# Patient Record
Sex: Male | Born: 1945 | Race: White | Hispanic: No | Marital: Married | Smoking: Former smoker
Health system: Southern US, Community
[De-identification: ages and names within clinical notes are randomized; demographics above are authoritative.]

## PROBLEM LIST (undated history)

## (undated) DIAGNOSIS — E349 Endocrine disorder, unspecified: Secondary | ICD-10-CM

## (undated) DIAGNOSIS — E119 Type 2 diabetes mellitus without complications: Secondary | ICD-10-CM

## (undated) DIAGNOSIS — E538 Deficiency of other specified B group vitamins: Secondary | ICD-10-CM

## (undated) DIAGNOSIS — M549 Dorsalgia, unspecified: Secondary | ICD-10-CM

## (undated) DIAGNOSIS — E785 Hyperlipidemia, unspecified: Secondary | ICD-10-CM

## (undated) DIAGNOSIS — F419 Anxiety disorder, unspecified: Secondary | ICD-10-CM

## (undated) HISTORY — PX: STRABISMUS SURGERY: SHX218

## (undated) HISTORY — PX: CHOLECYSTECTOMY: SHX55

---

## 2010-10-07 ENCOUNTER — Ambulatory Visit
Admission: RE | Admit: 2010-10-07 | Discharge: 2010-10-07 | Disposition: A | Discharge status: Home / Self Care | Payer: 59 | Source: Ambulatory Visit | Attending: Family Medicine | Admitting: Family Medicine

## 2010-10-07 ENCOUNTER — Other Ambulatory Visit: Payer: Self-pay | Admitting: Family Medicine

## 2010-10-07 ENCOUNTER — Other Ambulatory Visit: Payer: Self-pay | Admitting: Unknown Physician Specialty

## 2010-10-07 IMAGING — CR DG ABDOMEN 2V
3 series · 3 of 3 positions shown · non-contrast
Comparison: None.

CLINICAL DATA: Abdominal pain, evaluate for constipation.

ABDOMEN - 2 VIEW

[view not recorded (1 of 3)]
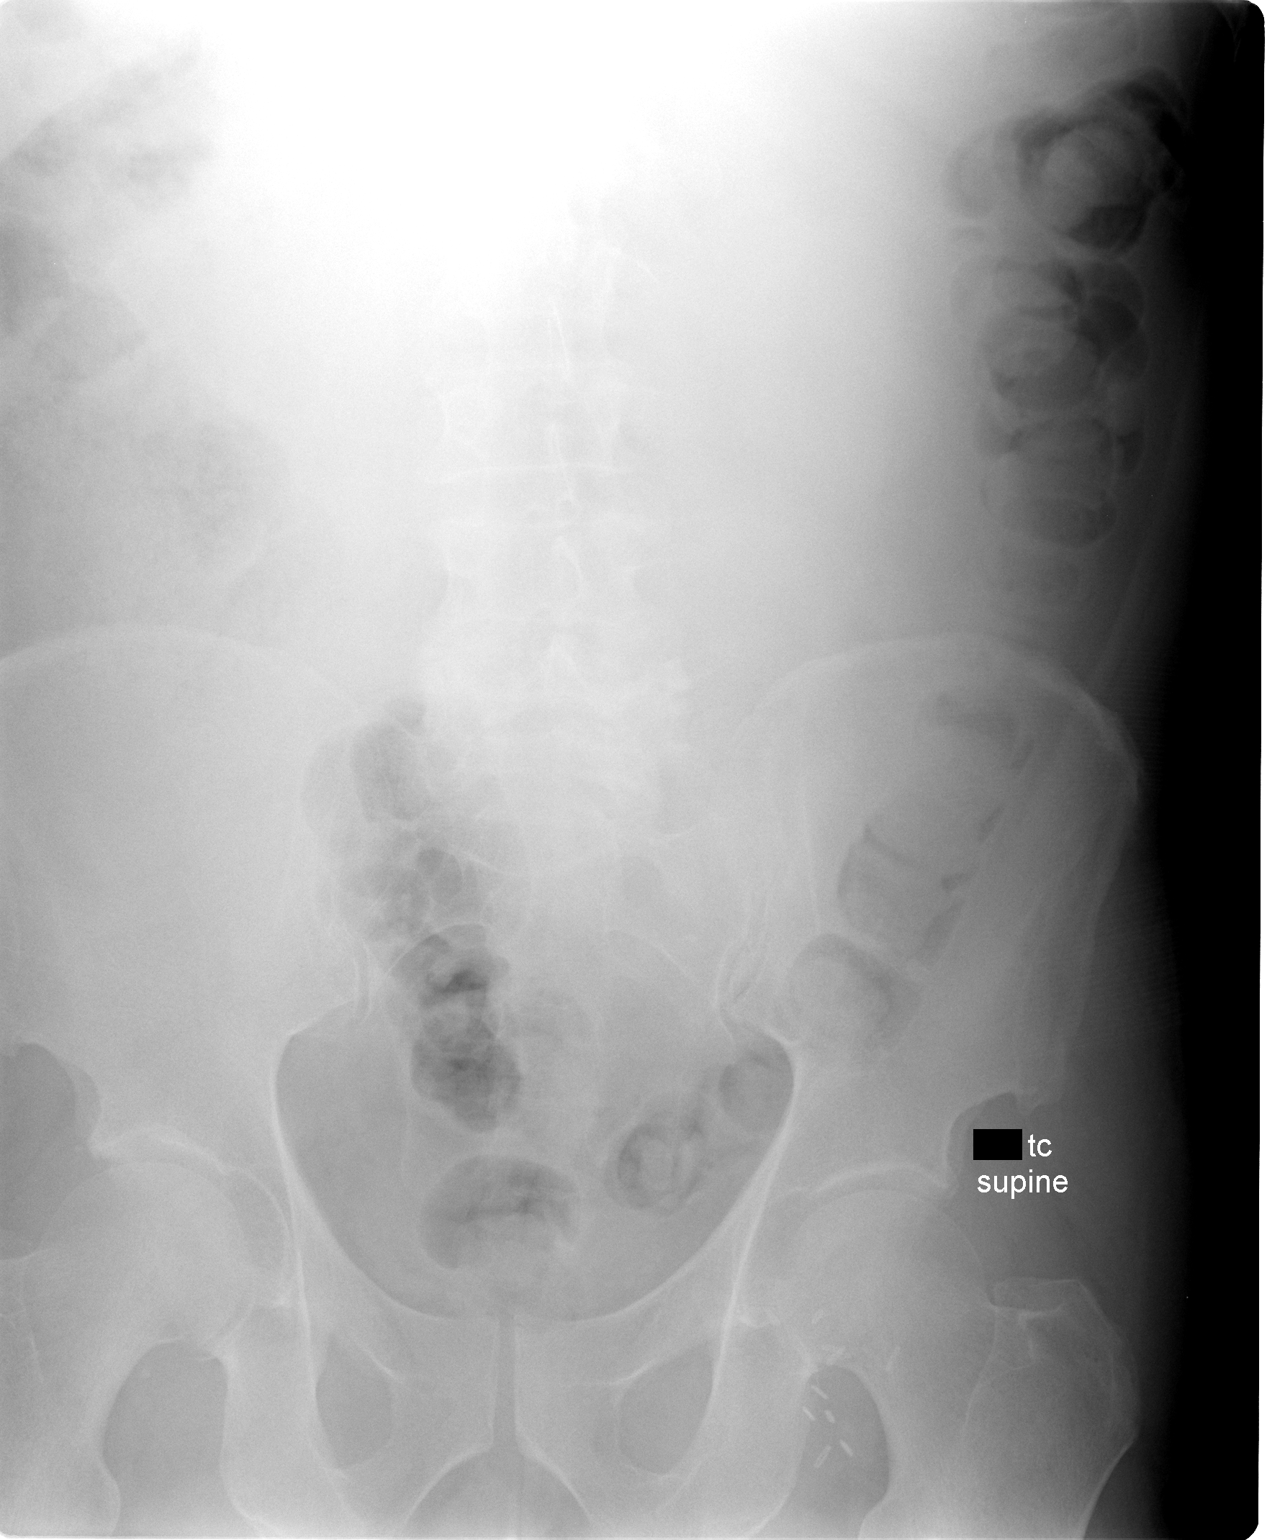

[view not recorded (2 of 3)]
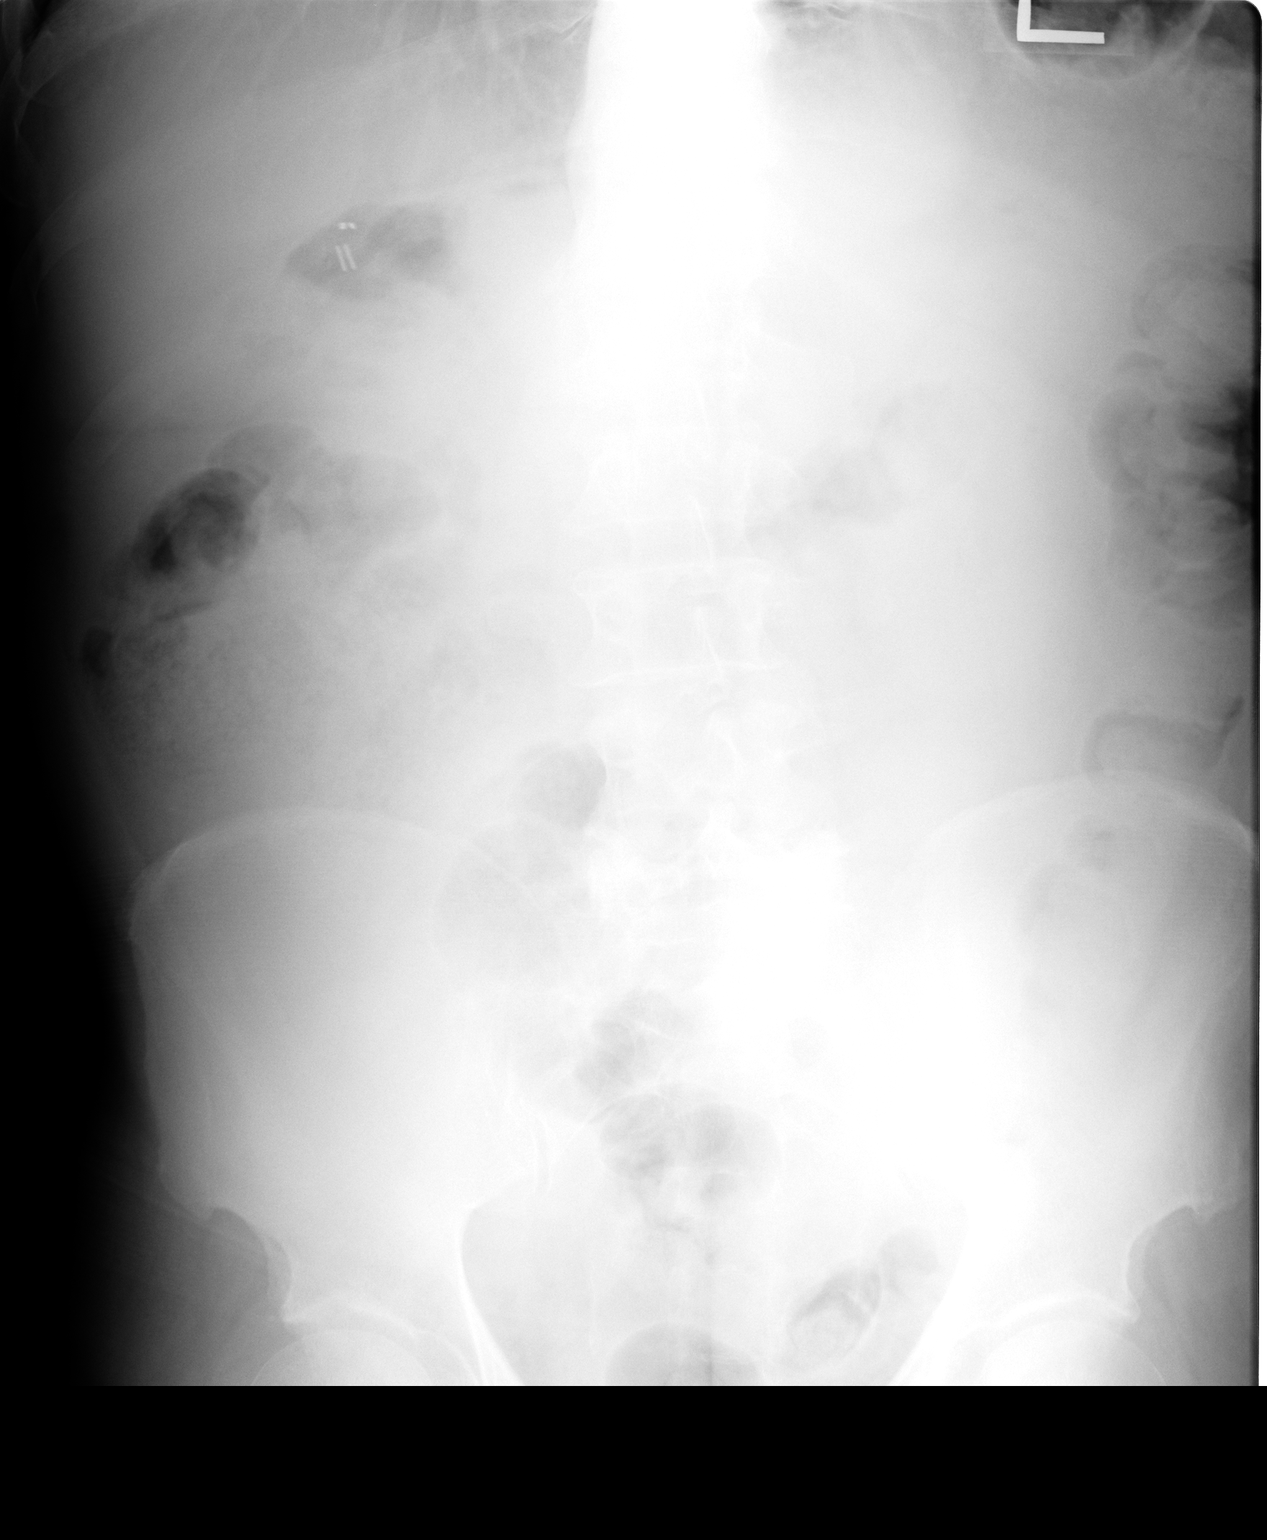

[view not recorded (3 of 3)]
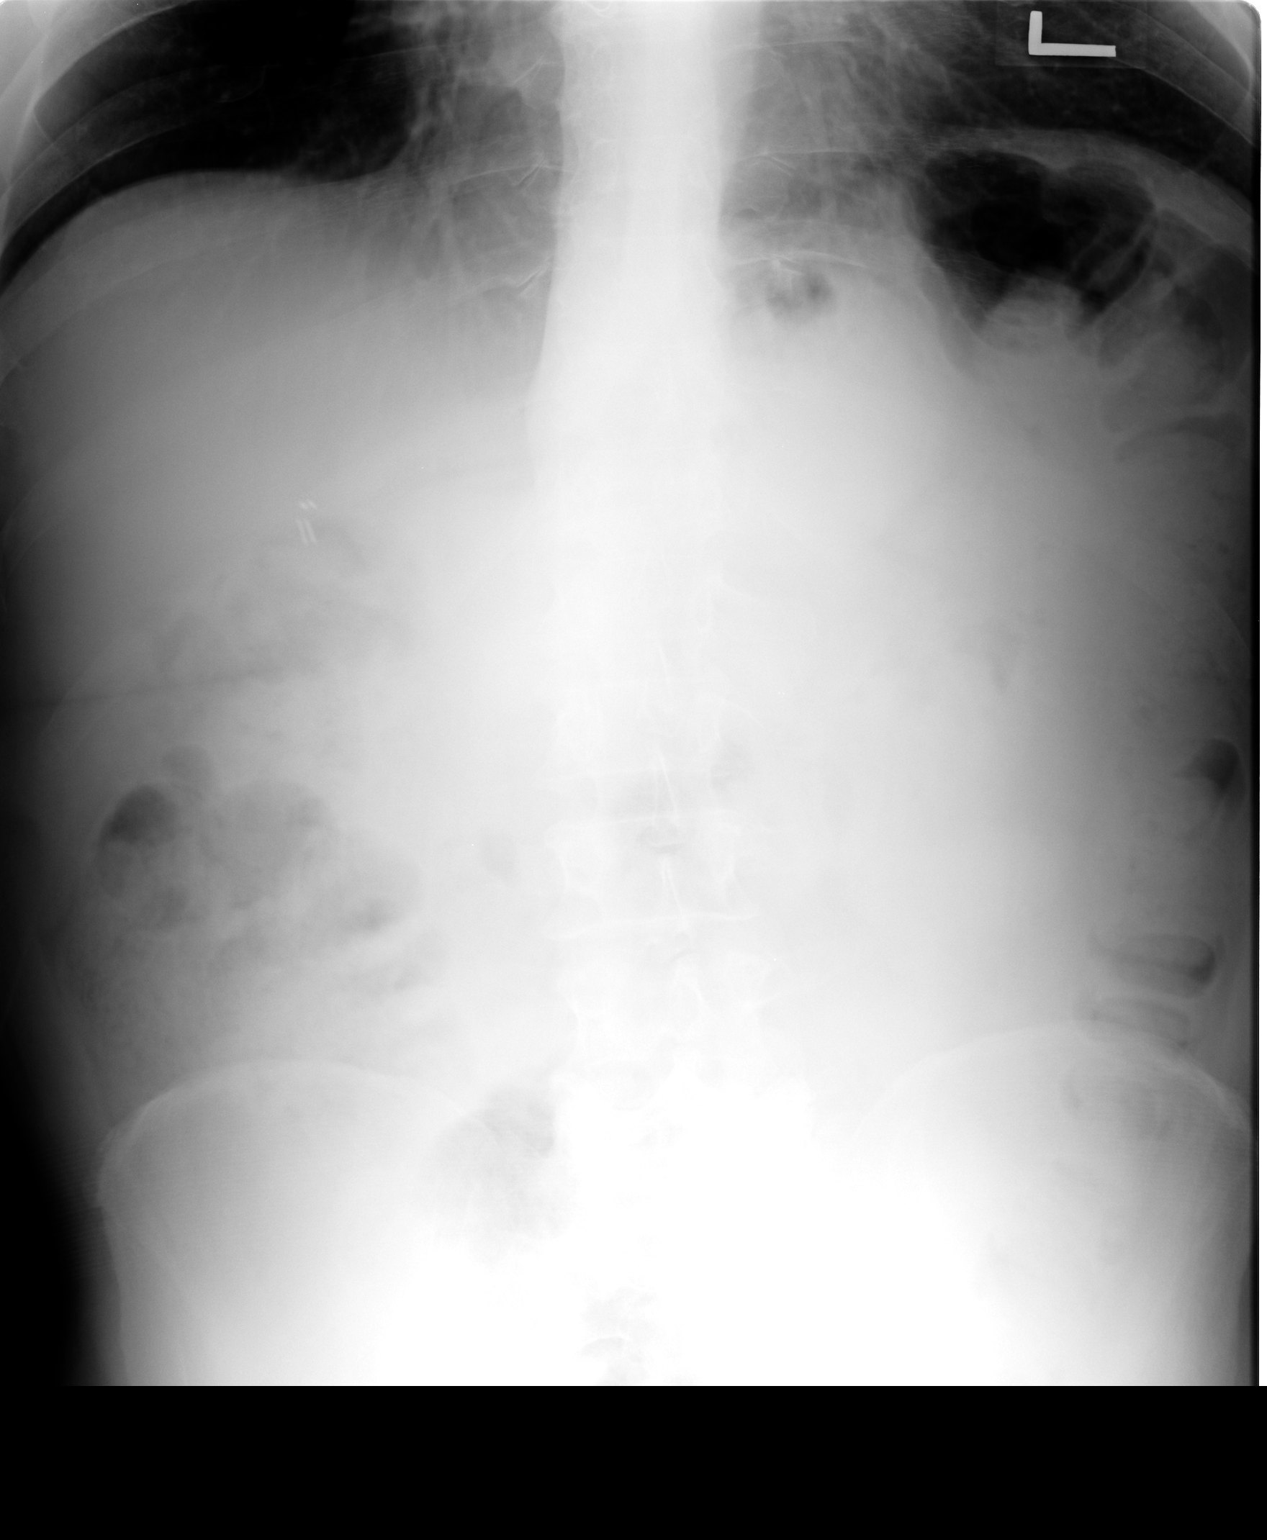

[3 of 3 positions shown; findings below may reference images not displayed]

FINDINGS: Nonobstructive bowel gas pattern.  Moderate amount of
stool in the colon, without findings to suggest fecal impaction.

No free air is seen.

Cholecystectomy clips.

Visualized osseous structures are within normal limits.
IMPRESSION: No evidence of obstruction or free air.

## 2011-08-12 HISTORY — PX: AORTIC VALVE REPLACEMENT: SHX41

## 2011-12-03 ENCOUNTER — Ambulatory Visit
Admission: RE | Admit: 2011-12-03 | Discharge: 2011-12-03 | Disposition: A | Discharge status: Home / Self Care | Payer: 59 | Source: Ambulatory Visit | Attending: Family Medicine | Admitting: Family Medicine

## 2011-12-03 ENCOUNTER — Other Ambulatory Visit: Payer: Self-pay | Admitting: Family Medicine

## 2011-12-03 DIAGNOSIS — R05 Cough: Secondary | ICD-10-CM

## 2011-12-03 DIAGNOSIS — R911 Solitary pulmonary nodule: Secondary | ICD-10-CM

## 2011-12-03 DIAGNOSIS — R0602 Shortness of breath: Secondary | ICD-10-CM

## 2011-12-03 IMAGING — CR DG CHEST 2V
2 series · 2 of 2 positions shown · non-contrast
Comparison: None.

CLINICAL DATA: Shortness of breath and cough.

CHEST - 2 VIEW

[view not recorded (1 of 2)]
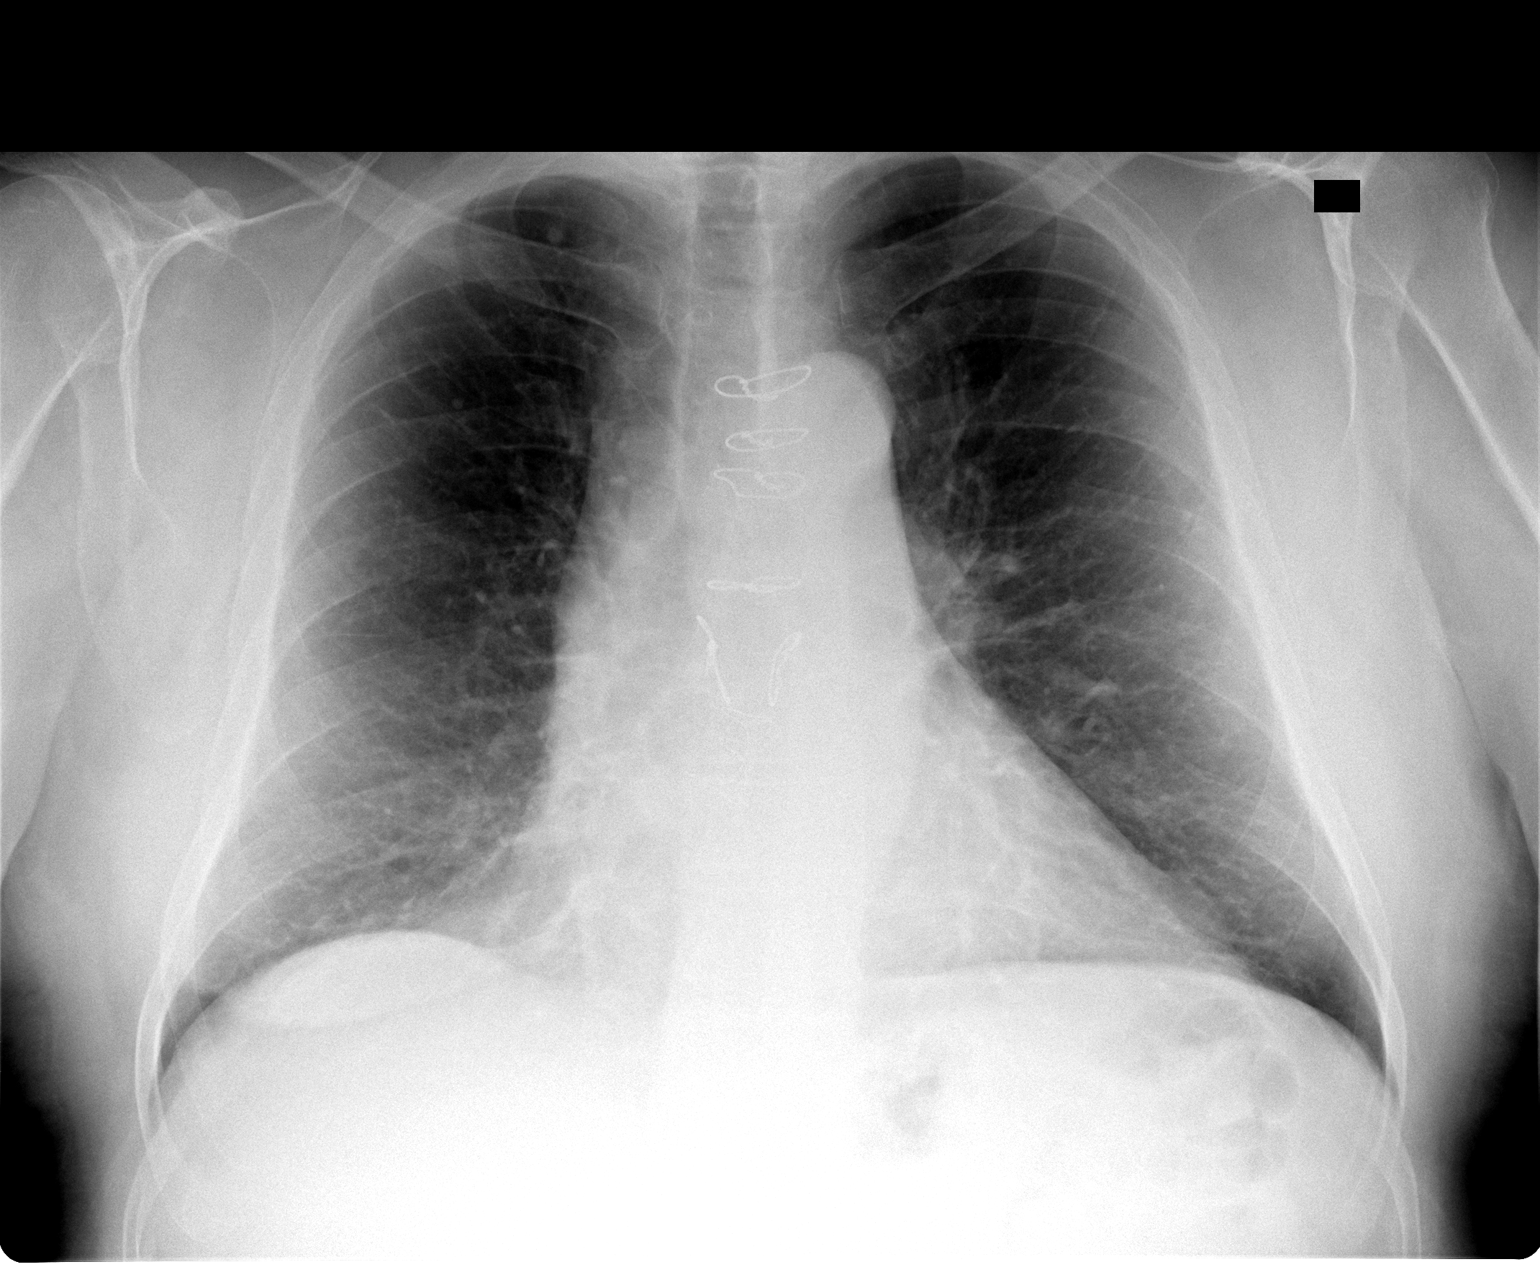

[view not recorded (2 of 2)]
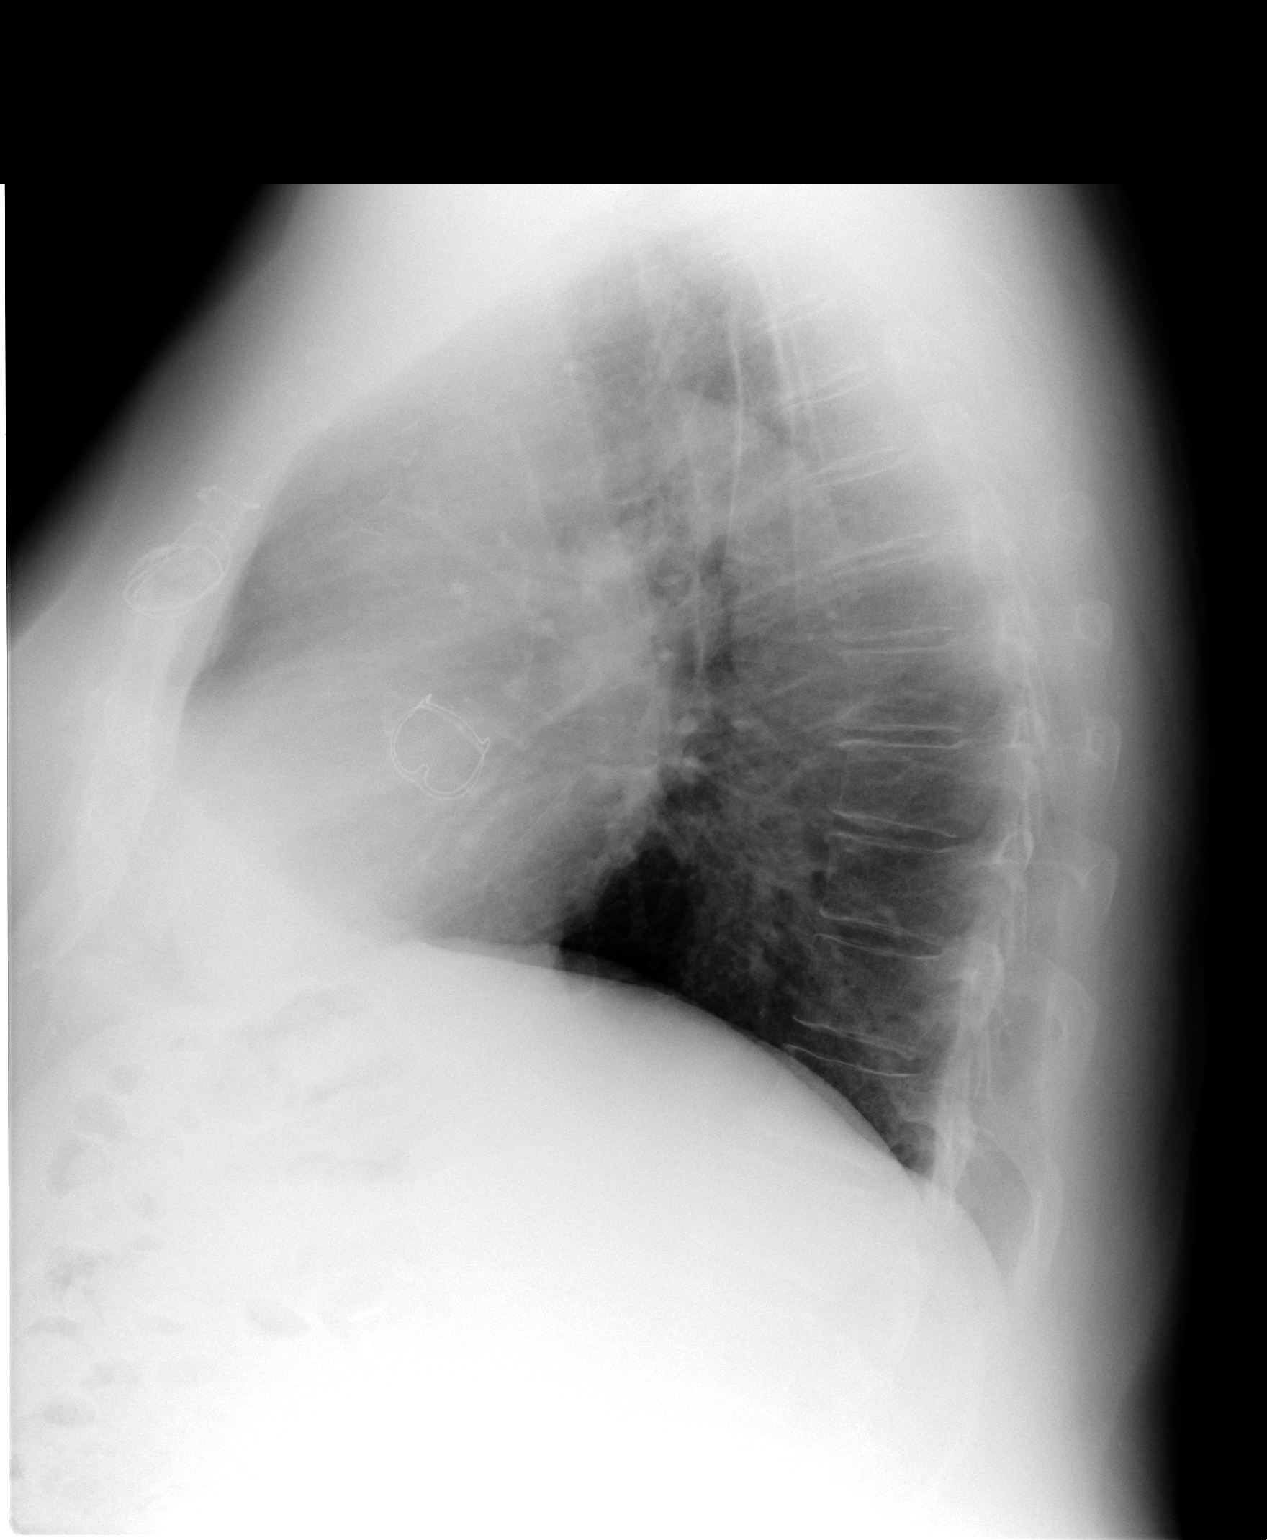

[2 of 2 positions shown; findings below may reference images not displayed]

FINDINGS: Two-view exam shows no focal airspace consolidation.  No
pulmonary edema. Small pulmonary nodule is seen in the right apex.
The cardiopericardial silhouette is enlarged.  Bones are diffusely
demineralized.
IMPRESSION: Small right apical pulmonary nodule.  CT chest without contrast
recommended to further evaluate.

## 2011-12-04 ENCOUNTER — Ambulatory Visit
Admission: RE | Admit: 2011-12-04 | Discharge: 2011-12-04 | Disposition: A | Discharge status: Home / Self Care | Payer: 59 | Source: Ambulatory Visit | Attending: Family Medicine | Admitting: Family Medicine

## 2011-12-04 DIAGNOSIS — R911 Solitary pulmonary nodule: Secondary | ICD-10-CM

## 2011-12-04 IMAGING — CT CT CHEST W/O CM
2 of 3 series · 15 of 36 positions shown, 18 images · non-contrast
Comparison: Chest radiograph [DATE].

CLINICAL DATA: Right apical pulmonary nodule on chest radiograph.
Shortness of breath and cough.

CT CHEST WITHOUT CONTRAST
TECHNIQUE: Multidetector CT imaging of the chest was performed
following the standard protocol without IV contrast.

[Series 2: chest with · axial · 0.96mm/px · z∈[-280,+5]mm · 12 of 67 slices shown, 15 images]
[im 5/67  mediastinal]
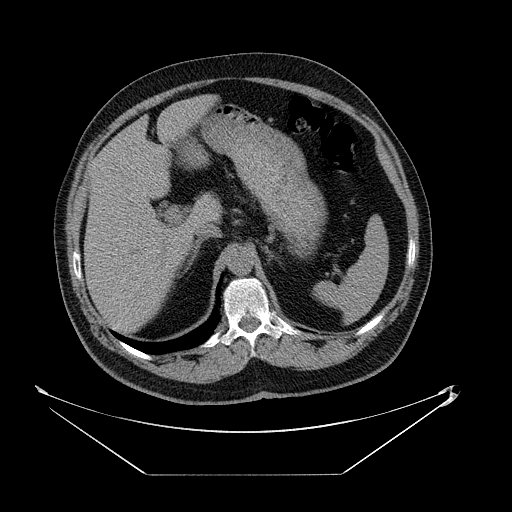
[im 5/67  lung]
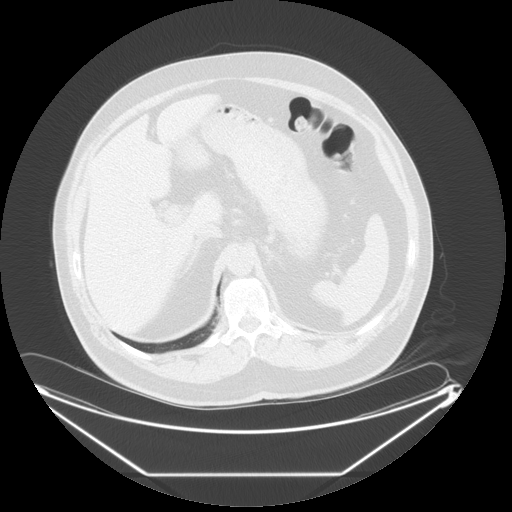
[im 10/67  lung]
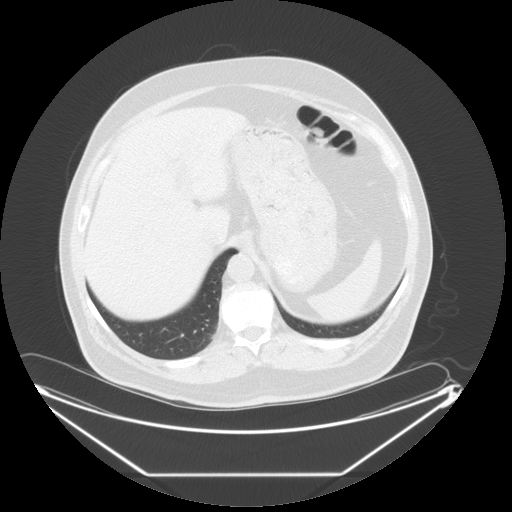
[im 15/67  lung]
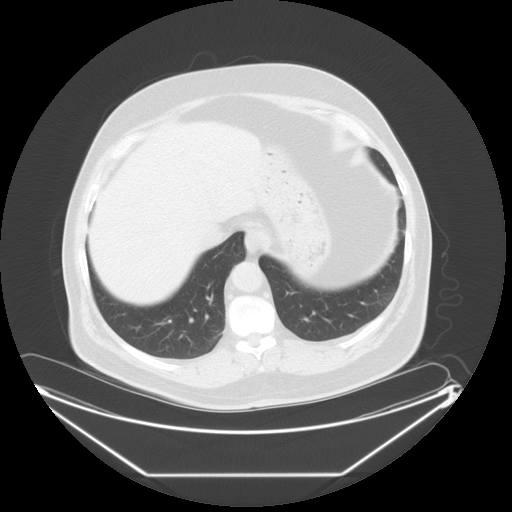
[im 20/67  lung]
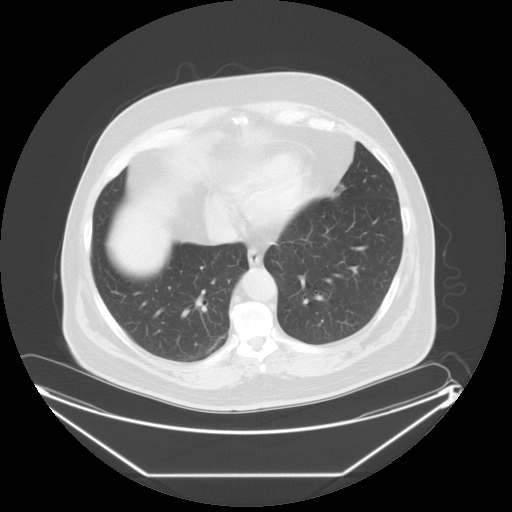
[im 25/67  mediastinal]
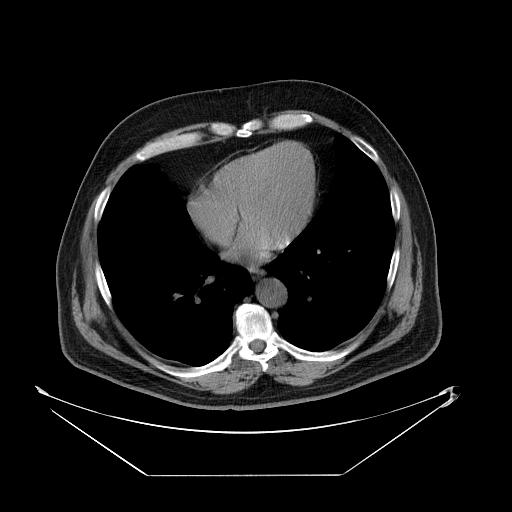
[im 25/67  lung]
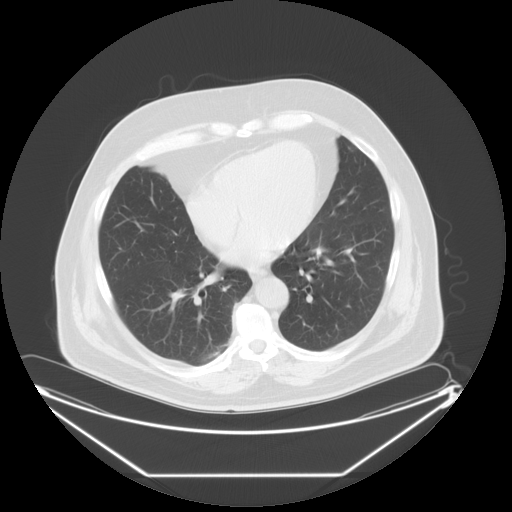
[im 30/67  lung]
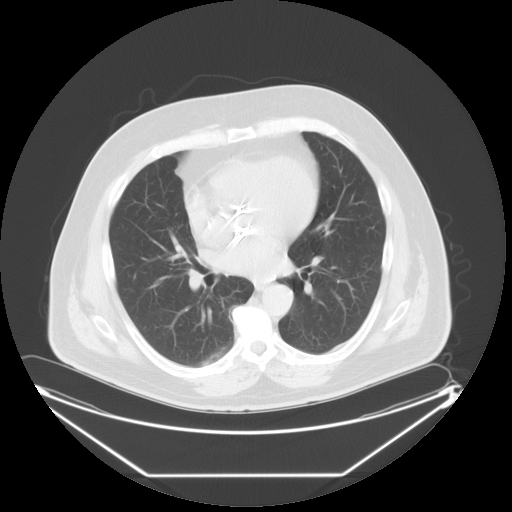
[im 37/67  lung]
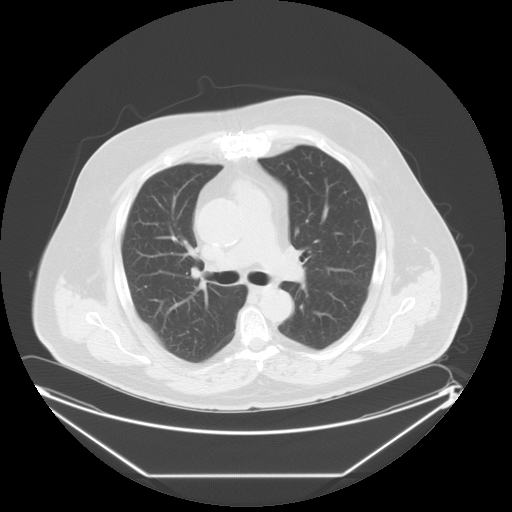
[im 42/67  lung]
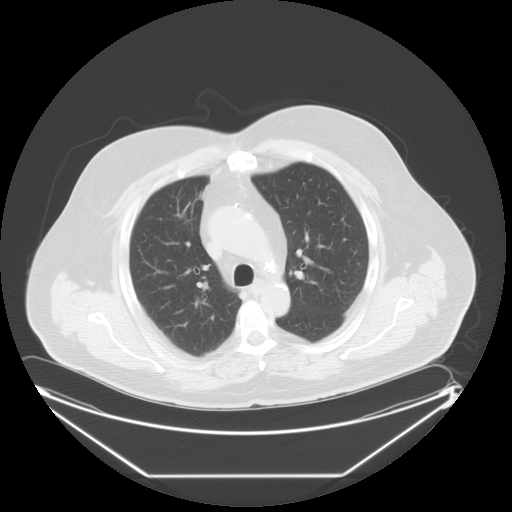
[im 47/67  mediastinal]
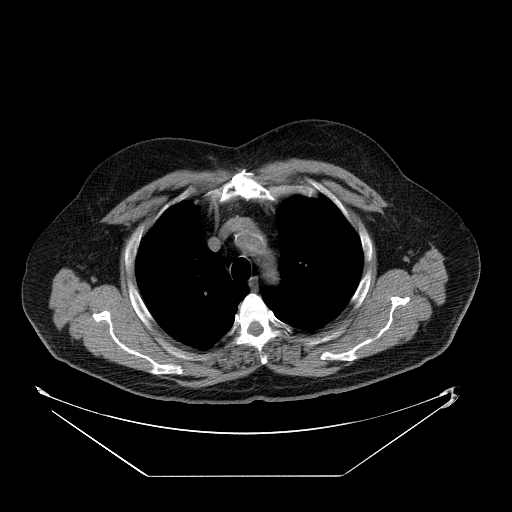
[im 47/67  lung]
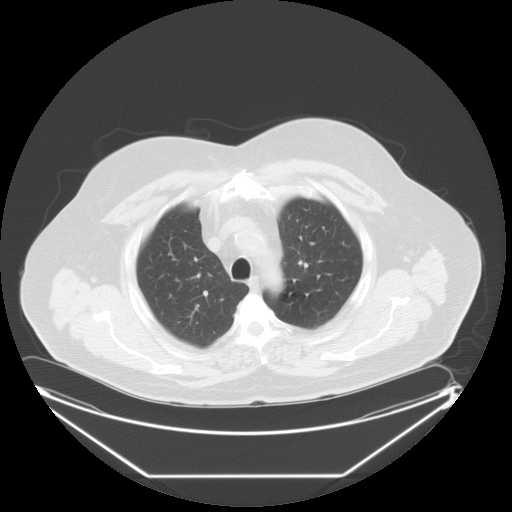
[im 52/67  lung]
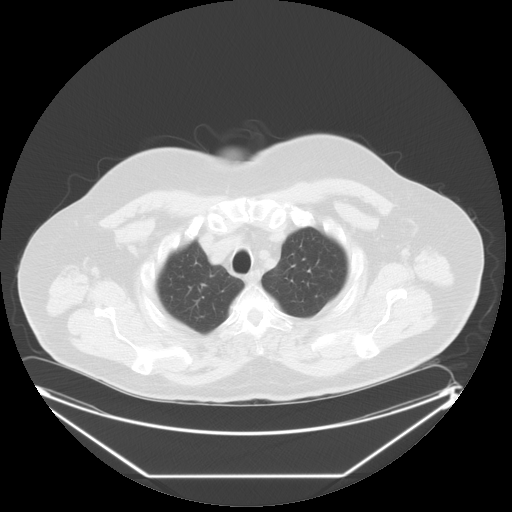
[im 57/67  lung]
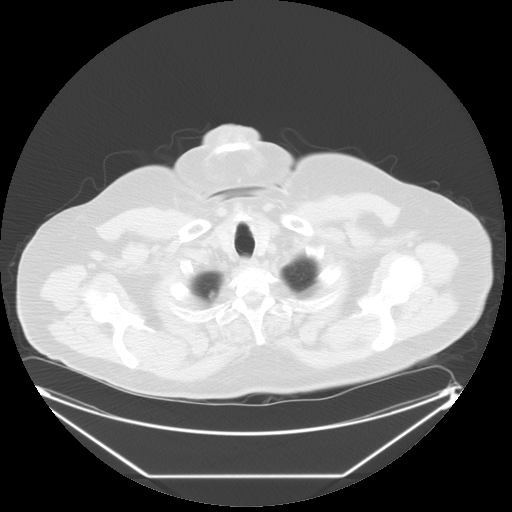
[im 62/67  lung]
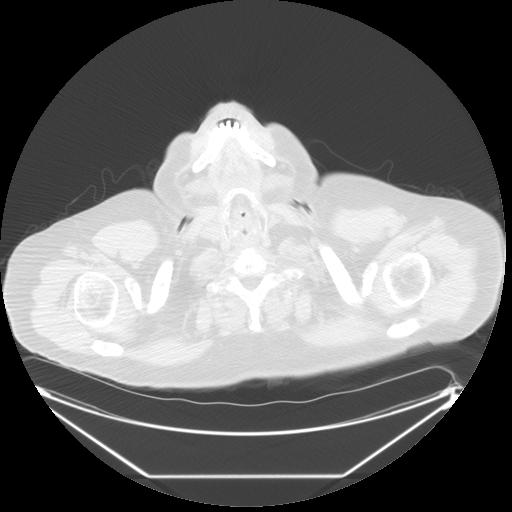

[Series 400: coronal · coronal · 0.96mm/px · 3 of 143 slices shown]
[im 29/143  lung]
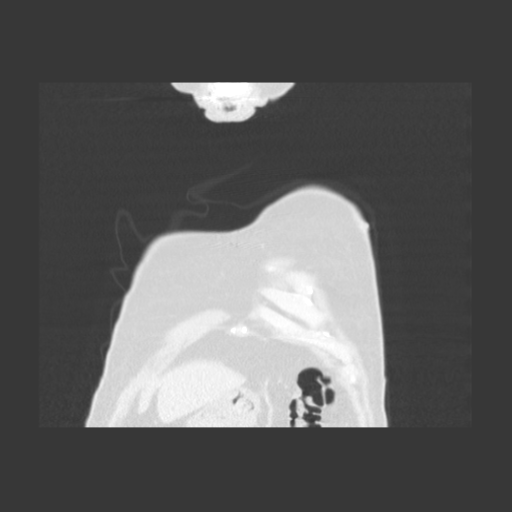
[im 57/143  lung]
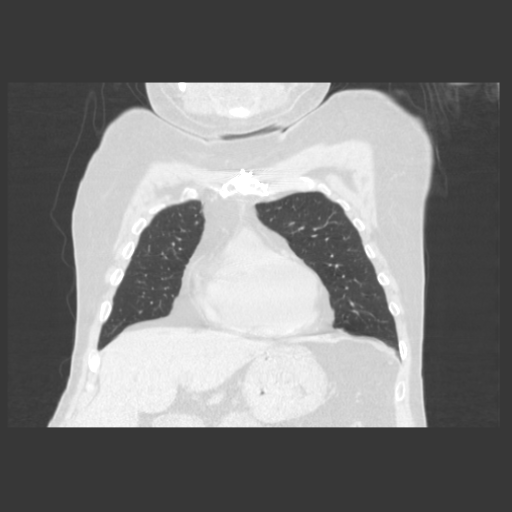
[im 86/143  lung]
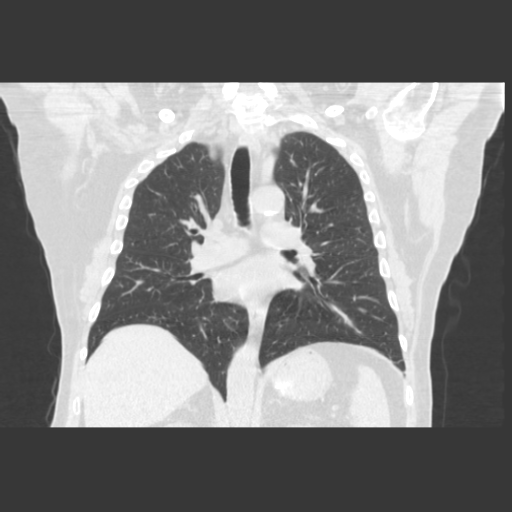

[15 of 36 positions shown; findings below may reference images not displayed]

FINDINGS: No pathologically enlarged mediastinal or axillary lymph
nodes.  Hilar regions are difficult to definitively evaluate
without IV contrast.  Heart size normal.  No pericardial effusion.

There is a calcified granuloma at the apex of the right upper lobe,
which accounts for the questioned abnormality on recent chest
radiograph.  Minimal dependent atelectasis or scarring in the right
lower lobe.  Lungs are otherwise clear.  No pleural fluid.  Airway
is unremarkable.

Incidental imaging of the upper abdomen shows no acute findings.
No worrisome lytic or sclerotic lesions.
IMPRESSION: 1.  A calcified granuloma in the apical segment right upper lobe
accounts for the questioned abnormality on yesterday's chest
radiograph.
2.  No findings to explain the patient's shortness of breath and
cough.

## 2013-05-23 ENCOUNTER — Ambulatory Visit (INDEPENDENT_AMBULATORY_CARE_PROVIDER_SITE_OTHER): Payer: Medicare Other

## 2013-05-23 ENCOUNTER — Other Ambulatory Visit: Payer: Self-pay | Admitting: Family Medicine

## 2013-05-23 DIAGNOSIS — R197 Diarrhea, unspecified: Secondary | ICD-10-CM

## 2013-05-23 DIAGNOSIS — R141 Gas pain: Secondary | ICD-10-CM

## 2013-05-23 DIAGNOSIS — R109 Unspecified abdominal pain: Secondary | ICD-10-CM

## 2013-05-23 IMAGING — CR DG ABDOMEN 2V
3 series · 3 of 3 positions shown · non-contrast
Comparison: None

CLINICAL DATA: Diarrhea for 4 weeks, abdominal discomfort and
hardness, smoker

EXAM:
ABDOMEN - 2 VIEW

[view not recorded (1 of 3)]
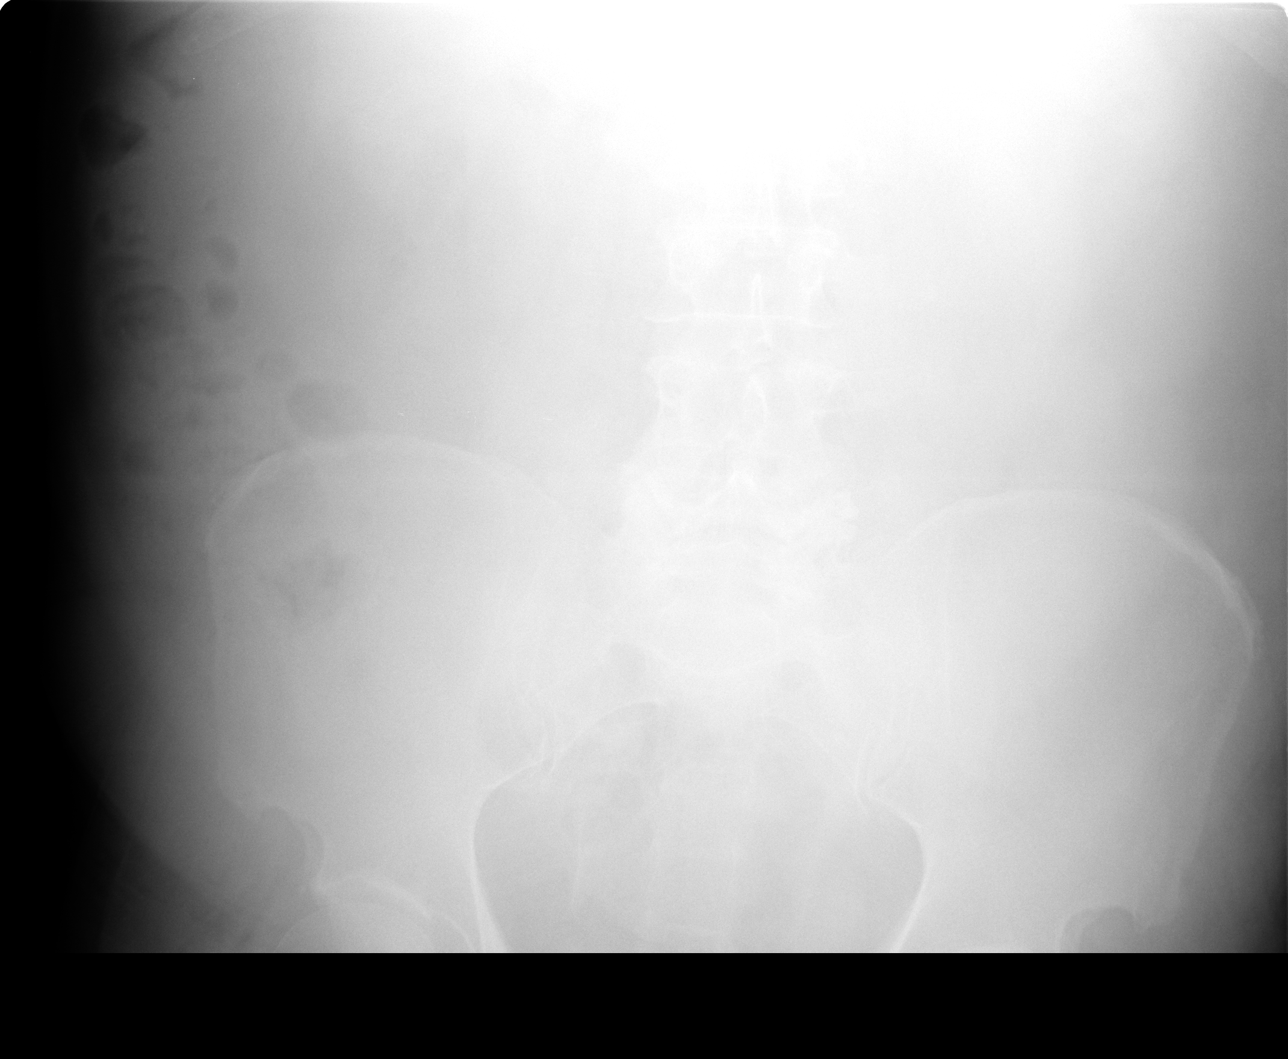

[view not recorded (2 of 3)]
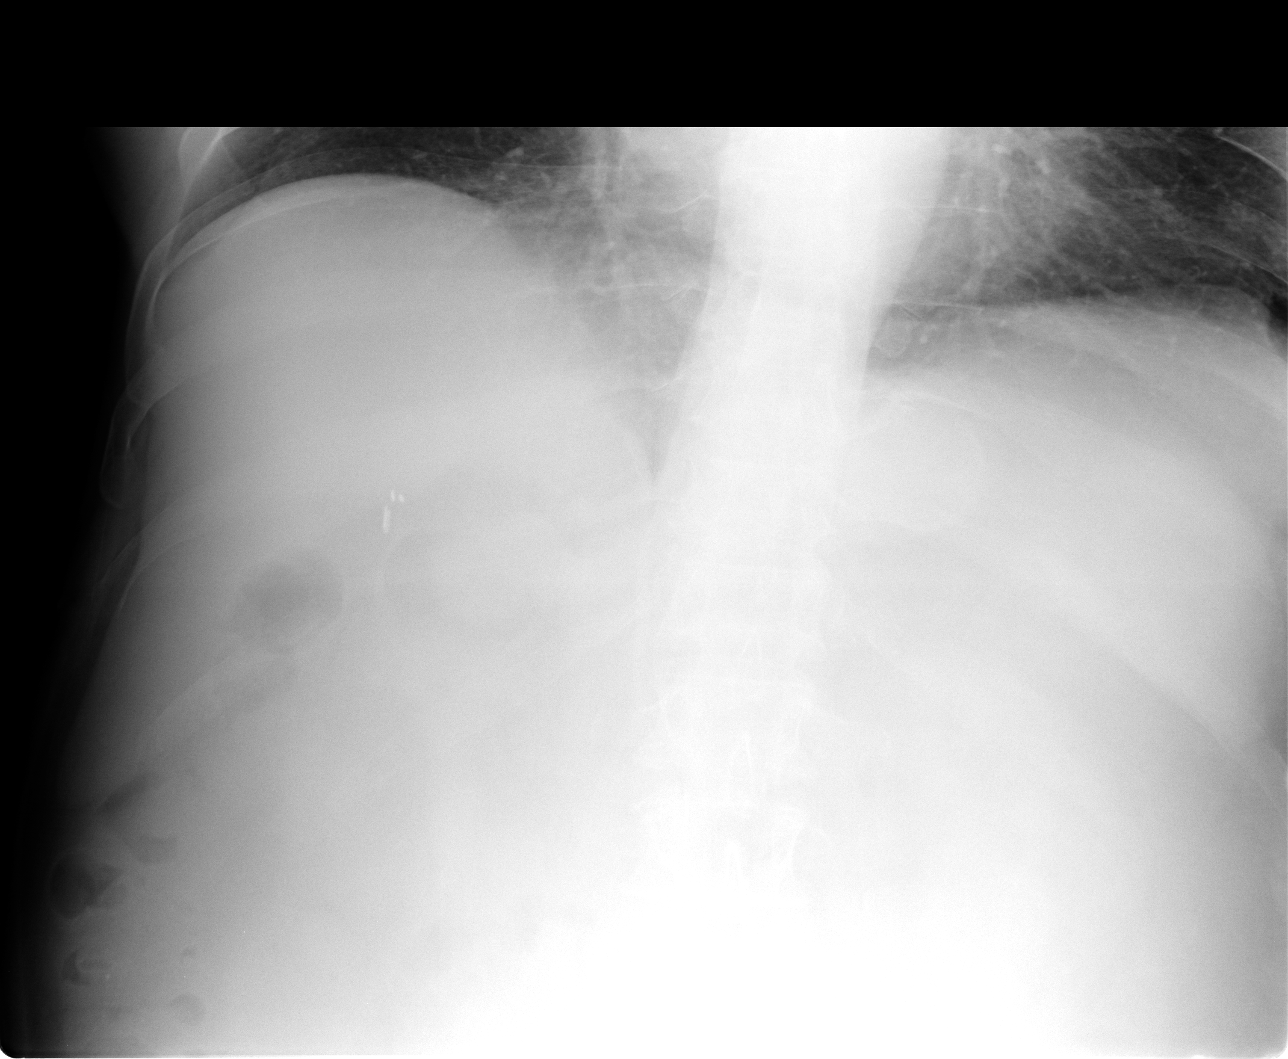

[view not recorded (3 of 3)]
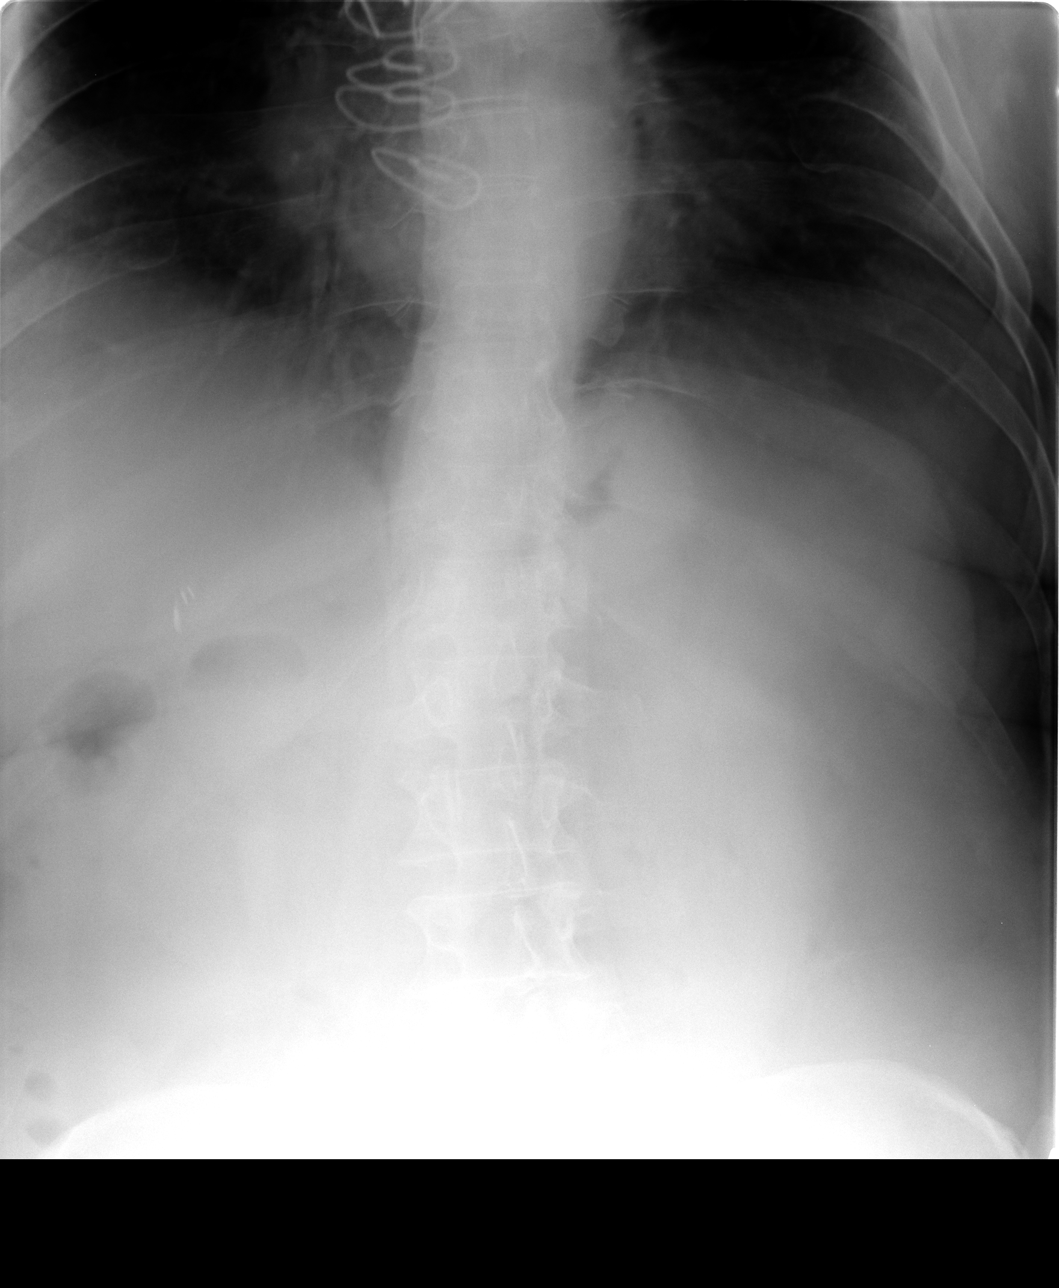

[3 of 3 positions shown; findings below may reference images not displayed]

FINDINGS: Increased attenuation of the abdomen could reflect ascites or be
technical from underpenetration.

Nonobstructive bowel gas pattern.

No bowel dilatation, bowel wall thickening, or definite mass effect.

Surgical clips right upper quadrant consistent cholecystectomy.
IMPRESSION: Nonobstructive bowel gas pattern.

Question increased attenuation of the abdomen could be technical
related to underpenetration but cannot exclude ascites.

## 2014-08-14 DIAGNOSIS — F17210 Nicotine dependence, cigarettes, uncomplicated: Secondary | ICD-10-CM | POA: Diagnosis not present

## 2014-08-14 DIAGNOSIS — I359 Nonrheumatic aortic valve disorder, unspecified: Secondary | ICD-10-CM | POA: Diagnosis not present

## 2014-08-14 DIAGNOSIS — Z952 Presence of prosthetic heart valve: Secondary | ICD-10-CM | POA: Diagnosis not present

## 2014-08-24 DIAGNOSIS — I517 Cardiomegaly: Secondary | ICD-10-CM | POA: Diagnosis not present

## 2014-08-24 DIAGNOSIS — I519 Heart disease, unspecified: Secondary | ICD-10-CM | POA: Diagnosis not present

## 2014-08-24 DIAGNOSIS — Z952 Presence of prosthetic heart valve: Secondary | ICD-10-CM | POA: Diagnosis not present

## 2014-08-24 DIAGNOSIS — I359 Nonrheumatic aortic valve disorder, unspecified: Secondary | ICD-10-CM | POA: Diagnosis not present

## 2014-09-14 DIAGNOSIS — G5602 Carpal tunnel syndrome, left upper limb: Secondary | ICD-10-CM | POA: Diagnosis not present

## 2014-09-14 DIAGNOSIS — G5601 Carpal tunnel syndrome, right upper limb: Secondary | ICD-10-CM | POA: Diagnosis not present

## 2014-09-14 DIAGNOSIS — M5417 Radiculopathy, lumbosacral region: Secondary | ICD-10-CM | POA: Diagnosis not present

## 2014-09-14 DIAGNOSIS — G603 Idiopathic progressive neuropathy: Secondary | ICD-10-CM | POA: Diagnosis not present

## 2014-09-14 DIAGNOSIS — R201 Hypoesthesia of skin: Secondary | ICD-10-CM | POA: Diagnosis not present

## 2014-09-20 DIAGNOSIS — Z716 Tobacco abuse counseling: Secondary | ICD-10-CM | POA: Diagnosis not present

## 2014-09-20 DIAGNOSIS — R0602 Shortness of breath: Secondary | ICD-10-CM | POA: Diagnosis not present

## 2014-09-20 DIAGNOSIS — Z720 Tobacco use: Secondary | ICD-10-CM | POA: Diagnosis not present

## 2014-10-17 DIAGNOSIS — E119 Type 2 diabetes mellitus without complications: Secondary | ICD-10-CM | POA: Diagnosis not present

## 2014-10-17 DIAGNOSIS — E780 Pure hypercholesterolemia: Secondary | ICD-10-CM | POA: Diagnosis not present

## 2014-10-17 DIAGNOSIS — M792 Neuralgia and neuritis, unspecified: Secondary | ICD-10-CM | POA: Diagnosis not present

## 2014-10-17 DIAGNOSIS — F439 Reaction to severe stress, unspecified: Secondary | ICD-10-CM | POA: Diagnosis not present

## 2014-10-17 DIAGNOSIS — E291 Testicular hypofunction: Secondary | ICD-10-CM | POA: Diagnosis not present

## 2014-10-26 DIAGNOSIS — M5412 Radiculopathy, cervical region: Secondary | ICD-10-CM | POA: Diagnosis not present

## 2014-10-26 DIAGNOSIS — M5417 Radiculopathy, lumbosacral region: Secondary | ICD-10-CM | POA: Diagnosis not present

## 2014-10-26 DIAGNOSIS — G5602 Carpal tunnel syndrome, left upper limb: Secondary | ICD-10-CM | POA: Diagnosis not present

## 2014-10-26 DIAGNOSIS — G603 Idiopathic progressive neuropathy: Secondary | ICD-10-CM | POA: Diagnosis not present

## 2014-10-26 DIAGNOSIS — G5601 Carpal tunnel syndrome, right upper limb: Secondary | ICD-10-CM | POA: Diagnosis not present

## 2015-01-23 DIAGNOSIS — M5412 Radiculopathy, cervical region: Secondary | ICD-10-CM | POA: Diagnosis not present

## 2015-01-23 DIAGNOSIS — M5417 Radiculopathy, lumbosacral region: Secondary | ICD-10-CM | POA: Diagnosis not present

## 2015-01-23 DIAGNOSIS — M5442 Lumbago with sciatica, left side: Secondary | ICD-10-CM | POA: Diagnosis not present

## 2015-01-23 DIAGNOSIS — R201 Hypoesthesia of skin: Secondary | ICD-10-CM | POA: Diagnosis not present

## 2015-01-23 DIAGNOSIS — M5441 Lumbago with sciatica, right side: Secondary | ICD-10-CM | POA: Diagnosis not present

## 2015-02-20 DIAGNOSIS — E780 Pure hypercholesterolemia: Secondary | ICD-10-CM | POA: Diagnosis not present

## 2015-02-20 DIAGNOSIS — F439 Reaction to severe stress, unspecified: Secondary | ICD-10-CM | POA: Diagnosis not present

## 2015-02-20 DIAGNOSIS — E291 Testicular hypofunction: Secondary | ICD-10-CM | POA: Diagnosis not present

## 2015-02-20 DIAGNOSIS — E119 Type 2 diabetes mellitus without complications: Secondary | ICD-10-CM | POA: Diagnosis not present

## 2015-02-20 DIAGNOSIS — Z0000 Encounter for general adult medical examination without abnormal findings: Secondary | ICD-10-CM | POA: Diagnosis not present

## 2015-02-20 DIAGNOSIS — M792 Neuralgia and neuritis, unspecified: Secondary | ICD-10-CM | POA: Diagnosis not present

## 2015-02-20 DIAGNOSIS — Z125 Encounter for screening for malignant neoplasm of prostate: Secondary | ICD-10-CM | POA: Diagnosis not present

## 2015-02-20 DIAGNOSIS — Z23 Encounter for immunization: Secondary | ICD-10-CM | POA: Diagnosis not present

## 2015-03-06 DIAGNOSIS — R1900 Intra-abdominal and pelvic swelling, mass and lump, unspecified site: Secondary | ICD-10-CM | POA: Diagnosis not present

## 2015-03-30 DIAGNOSIS — K622 Anal prolapse: Secondary | ICD-10-CM | POA: Diagnosis not present

## 2015-03-30 DIAGNOSIS — R198 Other specified symptoms and signs involving the digestive system and abdomen: Secondary | ICD-10-CM | POA: Diagnosis not present

## 2015-04-24 DIAGNOSIS — G603 Idiopathic progressive neuropathy: Secondary | ICD-10-CM | POA: Diagnosis not present

## 2015-04-24 DIAGNOSIS — G5602 Carpal tunnel syndrome, left upper limb: Secondary | ICD-10-CM | POA: Diagnosis not present

## 2015-04-24 DIAGNOSIS — G5601 Carpal tunnel syndrome, right upper limb: Secondary | ICD-10-CM | POA: Diagnosis not present

## 2015-04-24 DIAGNOSIS — M5441 Lumbago with sciatica, right side: Secondary | ICD-10-CM | POA: Diagnosis not present

## 2015-04-24 DIAGNOSIS — M5442 Lumbago with sciatica, left side: Secondary | ICD-10-CM | POA: Diagnosis not present

## 2015-06-01 DIAGNOSIS — J9801 Acute bronchospasm: Secondary | ICD-10-CM | POA: Diagnosis not present

## 2015-06-01 DIAGNOSIS — R062 Wheezing: Secondary | ICD-10-CM | POA: Diagnosis not present

## 2015-06-01 DIAGNOSIS — J069 Acute upper respiratory infection, unspecified: Secondary | ICD-10-CM | POA: Diagnosis not present

## 2015-06-25 ENCOUNTER — Other Ambulatory Visit: Payer: Self-pay | Admitting: Family Medicine

## 2015-06-25 ENCOUNTER — Ambulatory Visit (INDEPENDENT_AMBULATORY_CARE_PROVIDER_SITE_OTHER): Payer: Medicare Other

## 2015-06-25 DIAGNOSIS — R05 Cough: Secondary | ICD-10-CM

## 2015-06-25 DIAGNOSIS — E119 Type 2 diabetes mellitus without complications: Secondary | ICD-10-CM | POA: Diagnosis not present

## 2015-06-25 DIAGNOSIS — Z720 Tobacco use: Secondary | ICD-10-CM | POA: Diagnosis not present

## 2015-06-25 DIAGNOSIS — E291 Testicular hypofunction: Secondary | ICD-10-CM | POA: Diagnosis not present

## 2015-06-25 DIAGNOSIS — R0602 Shortness of breath: Secondary | ICD-10-CM

## 2015-06-25 DIAGNOSIS — E7800 Pure hypercholesterolemia, unspecified: Secondary | ICD-10-CM | POA: Diagnosis not present

## 2015-06-25 IMAGING — CR DG CHEST 2V
2 series · 2 of 2 positions shown · non-contrast
Comparison: Chest x-ray dated [DATE] and chest CT dated
[DATE]

CLINICAL DATA: Cough and shortness of breath.

EXAM:
CHEST  2 VIEW

[chest pa]
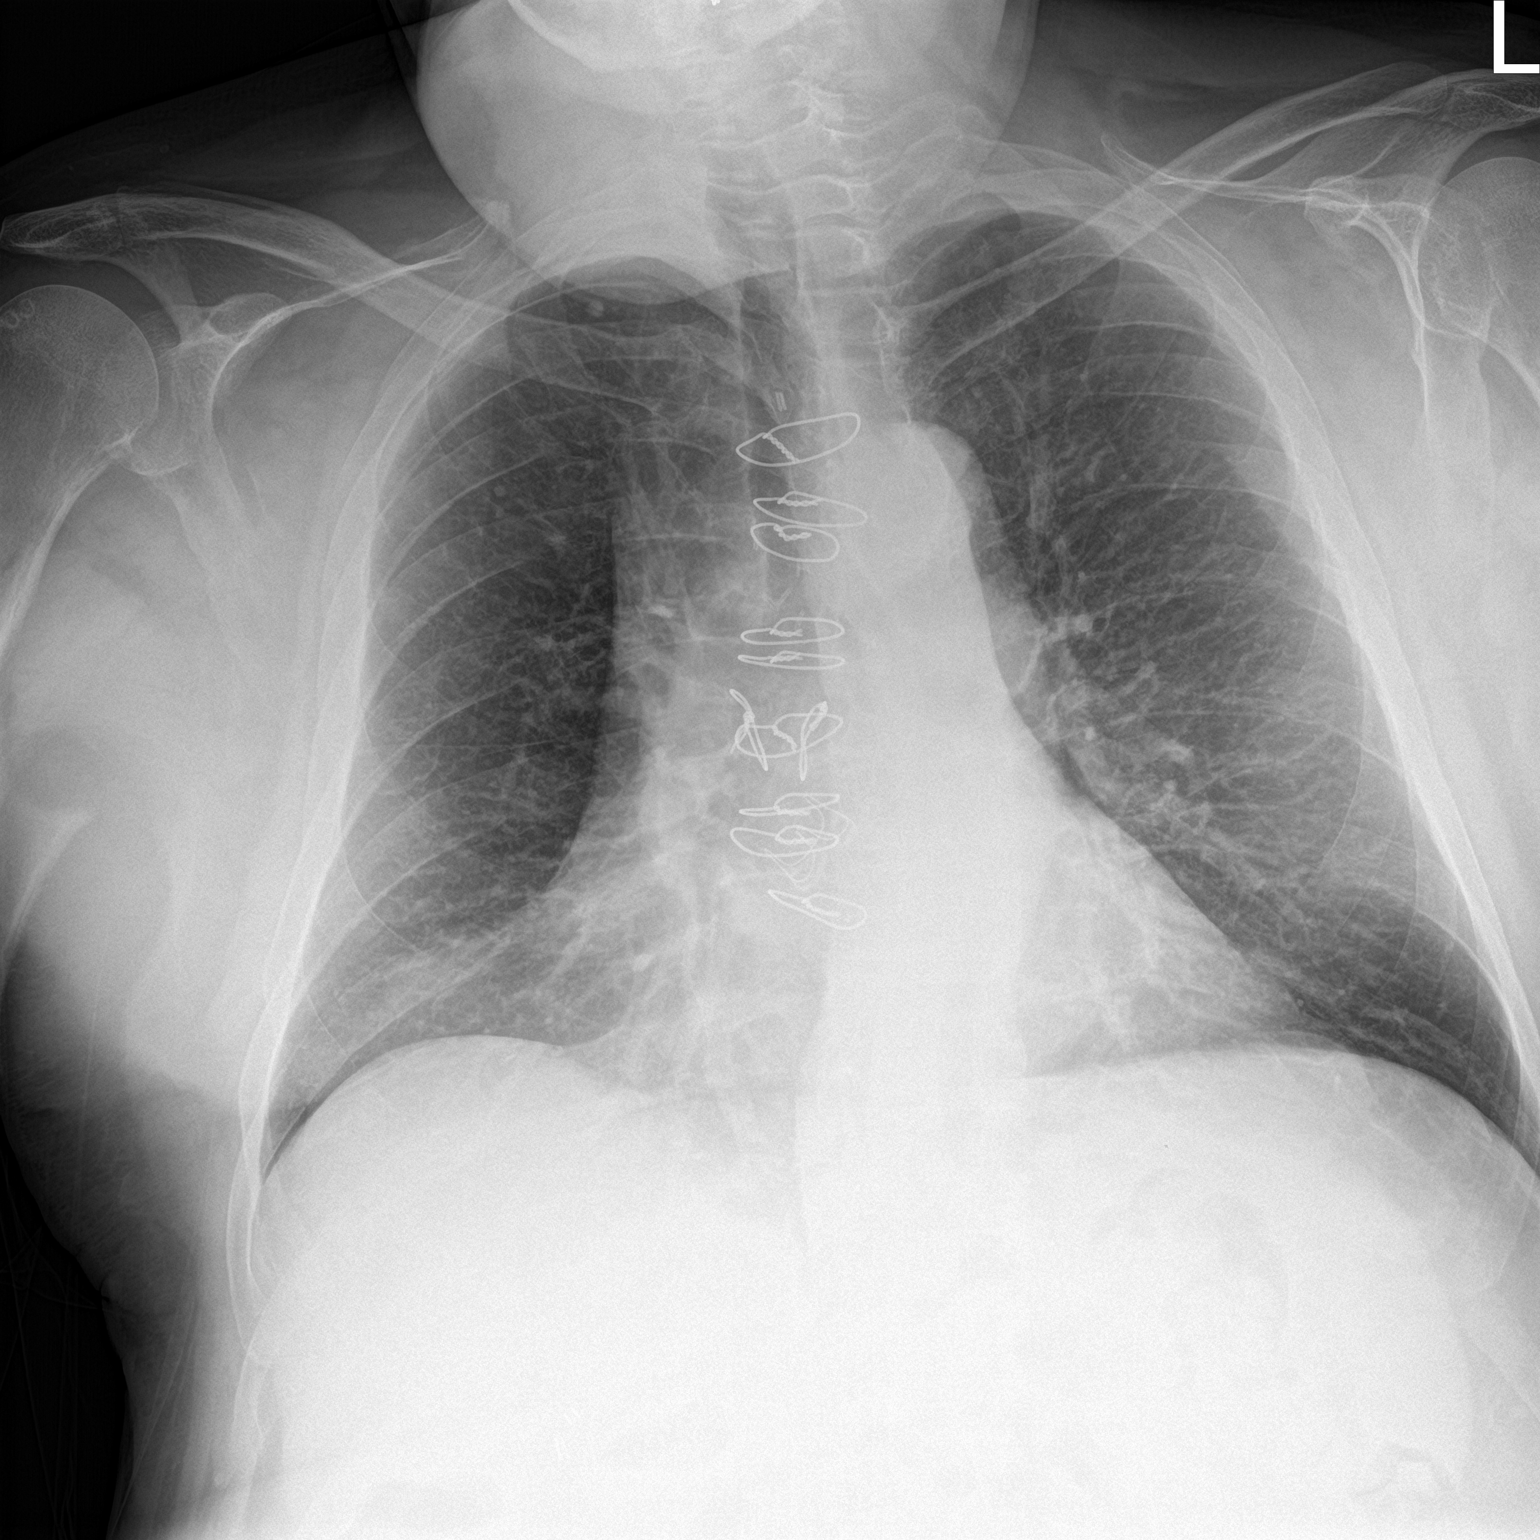

[chest lat]
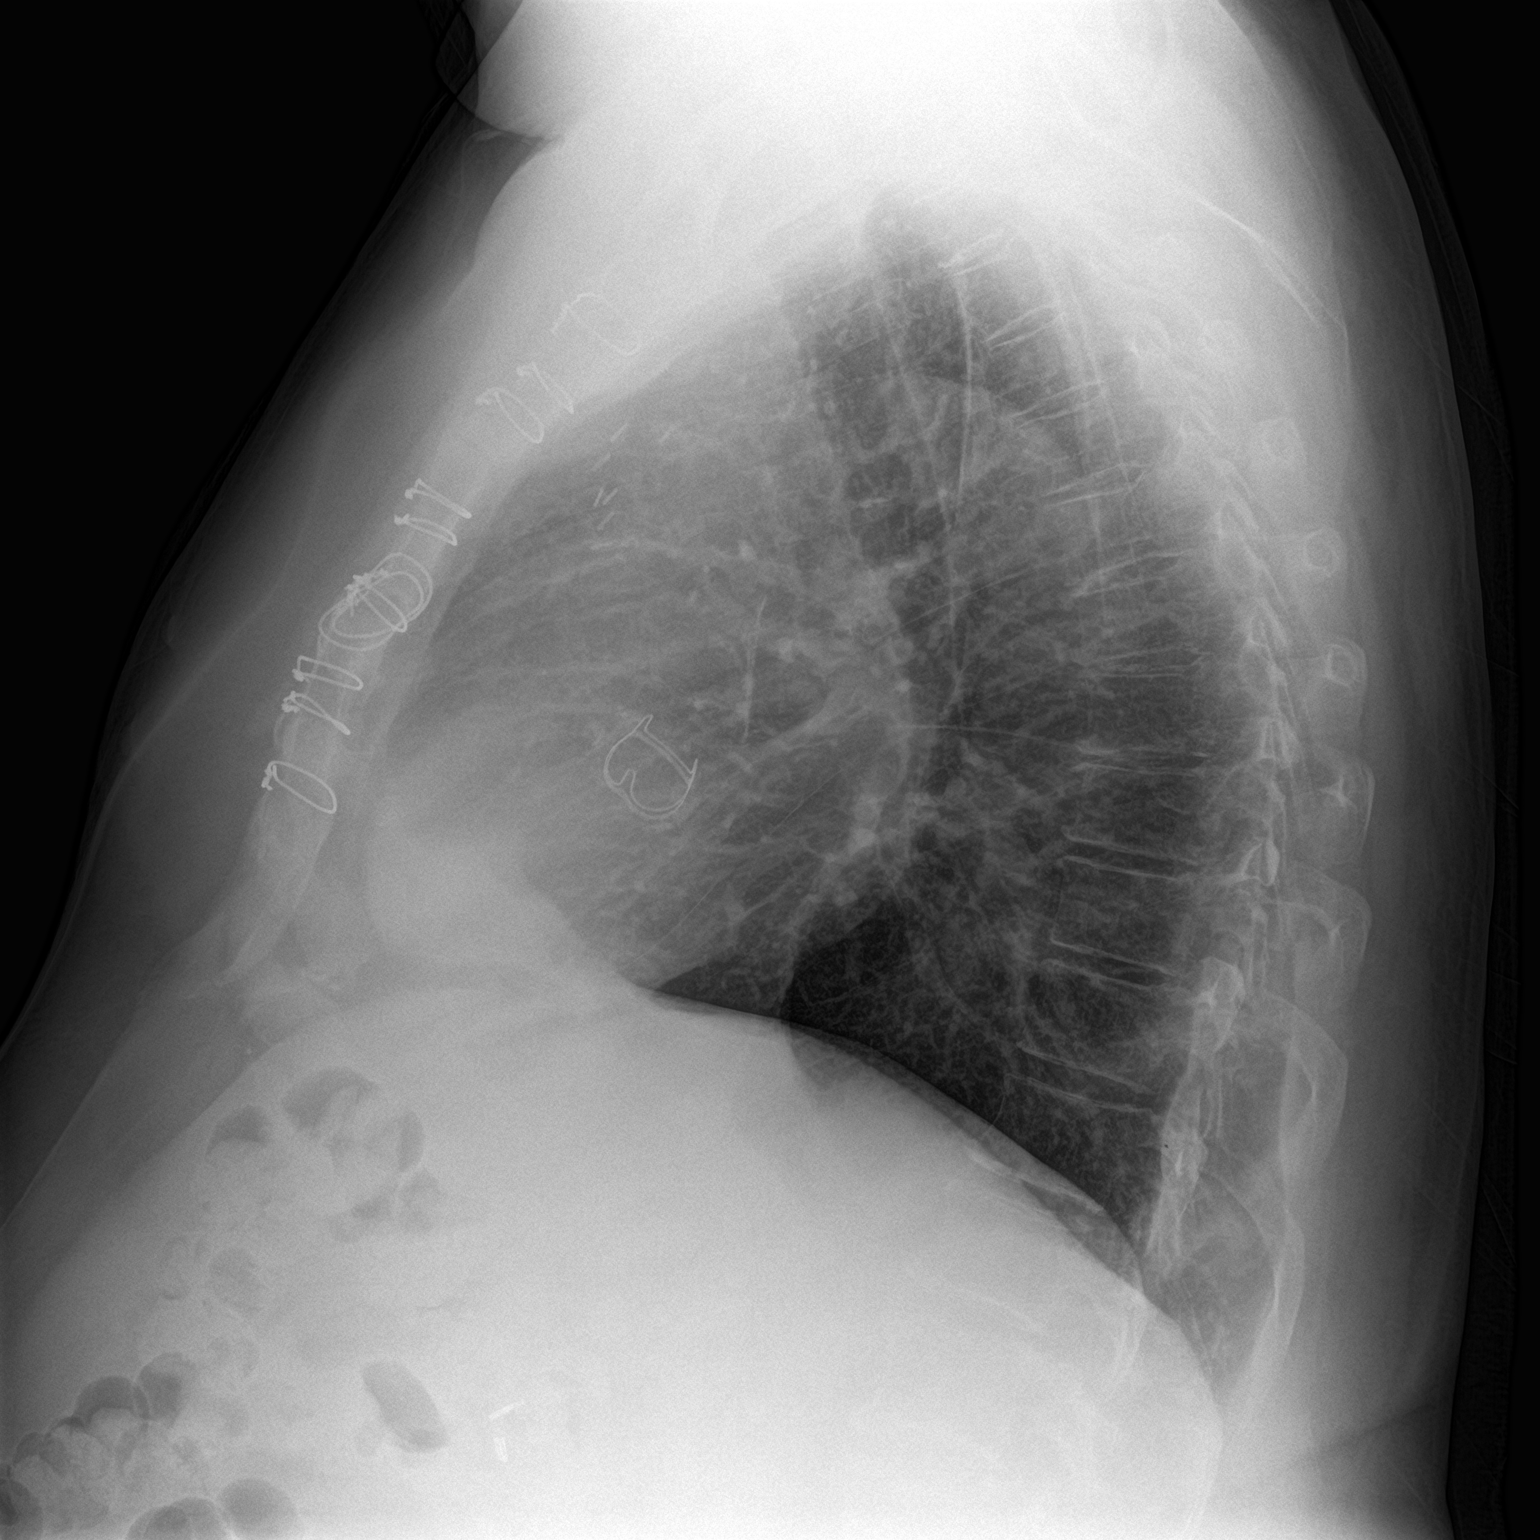

[2 of 2 positions shown; findings below may reference images not displayed]

FINDINGS: Heart size and pulmonary vascularity are normal. Prosthetic aortic
valve in place. Lungs are clear except for 2 tiny granulomas in the
right upper lobe. No effusions. No acute osseous abnormalities.
IMPRESSION: No acute abnormalities.

## 2015-06-26 DIAGNOSIS — E7800 Pure hypercholesterolemia, unspecified: Secondary | ICD-10-CM | POA: Diagnosis not present

## 2015-06-26 DIAGNOSIS — E119 Type 2 diabetes mellitus without complications: Secondary | ICD-10-CM | POA: Diagnosis not present

## 2015-06-26 DIAGNOSIS — E291 Testicular hypofunction: Secondary | ICD-10-CM | POA: Diagnosis not present

## 2015-07-24 DIAGNOSIS — M5417 Radiculopathy, lumbosacral region: Secondary | ICD-10-CM | POA: Diagnosis not present

## 2015-07-24 DIAGNOSIS — M5412 Radiculopathy, cervical region: Secondary | ICD-10-CM | POA: Diagnosis not present

## 2015-07-24 DIAGNOSIS — F329 Major depressive disorder, single episode, unspecified: Secondary | ICD-10-CM | POA: Diagnosis not present

## 2015-07-24 DIAGNOSIS — G5603 Carpal tunnel syndrome, bilateral upper limbs: Secondary | ICD-10-CM | POA: Diagnosis not present

## 2015-07-24 DIAGNOSIS — G603 Idiopathic progressive neuropathy: Secondary | ICD-10-CM | POA: Diagnosis not present

## 2015-07-31 DIAGNOSIS — J069 Acute upper respiratory infection, unspecified: Secondary | ICD-10-CM | POA: Diagnosis not present

## 2015-07-31 DIAGNOSIS — J029 Acute pharyngitis, unspecified: Secondary | ICD-10-CM | POA: Diagnosis not present

## 2015-09-24 DIAGNOSIS — B356 Tinea cruris: Secondary | ICD-10-CM | POA: Diagnosis not present

## 2015-09-24 DIAGNOSIS — L739 Follicular disorder, unspecified: Secondary | ICD-10-CM | POA: Diagnosis not present

## 2015-09-24 DIAGNOSIS — L02415 Cutaneous abscess of right lower limb: Secondary | ICD-10-CM | POA: Diagnosis not present

## 2015-10-08 DIAGNOSIS — L02214 Cutaneous abscess of groin: Secondary | ICD-10-CM | POA: Diagnosis not present

## 2015-10-08 DIAGNOSIS — L304 Erythema intertrigo: Secondary | ICD-10-CM | POA: Diagnosis not present

## 2015-10-23 DIAGNOSIS — E119 Type 2 diabetes mellitus without complications: Secondary | ICD-10-CM | POA: Diagnosis not present

## 2015-10-23 DIAGNOSIS — E7800 Pure hypercholesterolemia, unspecified: Secondary | ICD-10-CM | POA: Diagnosis not present

## 2015-10-24 DIAGNOSIS — E119 Type 2 diabetes mellitus without complications: Secondary | ICD-10-CM | POA: Diagnosis not present

## 2015-10-24 DIAGNOSIS — E7800 Pure hypercholesterolemia, unspecified: Secondary | ICD-10-CM | POA: Diagnosis not present

## 2015-12-06 DIAGNOSIS — G5602 Carpal tunnel syndrome, left upper limb: Secondary | ICD-10-CM | POA: Diagnosis not present

## 2015-12-06 DIAGNOSIS — M5442 Lumbago with sciatica, left side: Secondary | ICD-10-CM | POA: Diagnosis not present

## 2015-12-06 DIAGNOSIS — G603 Idiopathic progressive neuropathy: Secondary | ICD-10-CM | POA: Diagnosis not present

## 2015-12-06 DIAGNOSIS — M5417 Radiculopathy, lumbosacral region: Secondary | ICD-10-CM | POA: Diagnosis not present

## 2015-12-06 DIAGNOSIS — M5441 Lumbago with sciatica, right side: Secondary | ICD-10-CM | POA: Diagnosis not present

## 2015-12-19 DIAGNOSIS — M5417 Radiculopathy, lumbosacral region: Secondary | ICD-10-CM | POA: Diagnosis not present

## 2015-12-19 DIAGNOSIS — G5603 Carpal tunnel syndrome, bilateral upper limbs: Secondary | ICD-10-CM | POA: Diagnosis not present

## 2015-12-19 DIAGNOSIS — G5623 Lesion of ulnar nerve, bilateral upper limbs: Secondary | ICD-10-CM | POA: Diagnosis not present

## 2015-12-19 DIAGNOSIS — F323 Major depressive disorder, single episode, severe with psychotic features: Secondary | ICD-10-CM | POA: Diagnosis not present

## 2015-12-19 DIAGNOSIS — G603 Idiopathic progressive neuropathy: Secondary | ICD-10-CM | POA: Diagnosis not present

## 2015-12-26 DIAGNOSIS — Z952 Presence of prosthetic heart valve: Secondary | ICD-10-CM | POA: Diagnosis not present

## 2015-12-26 DIAGNOSIS — R001 Bradycardia, unspecified: Secondary | ICD-10-CM | POA: Diagnosis not present

## 2016-03-25 DIAGNOSIS — E7800 Pure hypercholesterolemia, unspecified: Secondary | ICD-10-CM | POA: Diagnosis not present

## 2016-03-25 DIAGNOSIS — E119 Type 2 diabetes mellitus without complications: Secondary | ICD-10-CM | POA: Diagnosis not present

## 2016-03-25 DIAGNOSIS — Z1212 Encounter for screening for malignant neoplasm of rectum: Secondary | ICD-10-CM | POA: Diagnosis not present

## 2016-03-26 DIAGNOSIS — E538 Deficiency of other specified B group vitamins: Secondary | ICD-10-CM | POA: Diagnosis not present

## 2016-03-26 DIAGNOSIS — G629 Polyneuropathy, unspecified: Secondary | ICD-10-CM | POA: Diagnosis not present

## 2016-03-26 DIAGNOSIS — E119 Type 2 diabetes mellitus without complications: Secondary | ICD-10-CM | POA: Diagnosis not present

## 2016-03-26 DIAGNOSIS — E7800 Pure hypercholesterolemia, unspecified: Secondary | ICD-10-CM | POA: Diagnosis not present

## 2016-03-26 DIAGNOSIS — E1140 Type 2 diabetes mellitus with diabetic neuropathy, unspecified: Secondary | ICD-10-CM | POA: Diagnosis not present

## 2016-03-26 DIAGNOSIS — Z1212 Encounter for screening for malignant neoplasm of rectum: Secondary | ICD-10-CM | POA: Diagnosis not present

## 2016-03-26 DIAGNOSIS — Z0000 Encounter for general adult medical examination without abnormal findings: Secondary | ICD-10-CM | POA: Diagnosis not present

## 2016-03-26 DIAGNOSIS — E291 Testicular hypofunction: Secondary | ICD-10-CM | POA: Diagnosis not present

## 2016-04-29 DIAGNOSIS — Z23 Encounter for immunization: Secondary | ICD-10-CM | POA: Diagnosis not present

## 2016-05-01 DIAGNOSIS — E291 Testicular hypofunction: Secondary | ICD-10-CM | POA: Diagnosis not present

## 2016-05-01 DIAGNOSIS — E538 Deficiency of other specified B group vitamins: Secondary | ICD-10-CM | POA: Diagnosis not present

## 2016-05-15 DIAGNOSIS — E291 Testicular hypofunction: Secondary | ICD-10-CM | POA: Diagnosis not present

## 2016-05-30 DIAGNOSIS — E291 Testicular hypofunction: Secondary | ICD-10-CM | POA: Diagnosis not present

## 2016-05-30 DIAGNOSIS — E538 Deficiency of other specified B group vitamins: Secondary | ICD-10-CM | POA: Diagnosis not present

## 2016-06-05 DIAGNOSIS — G5603 Carpal tunnel syndrome, bilateral upper limbs: Secondary | ICD-10-CM | POA: Diagnosis not present

## 2016-06-05 DIAGNOSIS — G603 Idiopathic progressive neuropathy: Secondary | ICD-10-CM | POA: Diagnosis not present

## 2016-06-05 DIAGNOSIS — M5441 Lumbago with sciatica, right side: Secondary | ICD-10-CM | POA: Diagnosis not present

## 2016-06-05 DIAGNOSIS — M542 Cervicalgia: Secondary | ICD-10-CM | POA: Diagnosis not present

## 2016-06-13 DIAGNOSIS — E291 Testicular hypofunction: Secondary | ICD-10-CM | POA: Diagnosis not present

## 2016-06-23 DIAGNOSIS — J9801 Acute bronchospasm: Secondary | ICD-10-CM | POA: Diagnosis not present

## 2016-06-23 DIAGNOSIS — J069 Acute upper respiratory infection, unspecified: Secondary | ICD-10-CM | POA: Diagnosis not present

## 2016-07-09 DIAGNOSIS — E538 Deficiency of other specified B group vitamins: Secondary | ICD-10-CM | POA: Diagnosis not present

## 2016-07-09 DIAGNOSIS — E291 Testicular hypofunction: Secondary | ICD-10-CM | POA: Diagnosis not present

## 2016-08-06 DIAGNOSIS — J439 Emphysema, unspecified: Secondary | ICD-10-CM | POA: Diagnosis not present

## 2016-08-06 DIAGNOSIS — G629 Polyneuropathy, unspecified: Secondary | ICD-10-CM | POA: Diagnosis not present

## 2016-08-06 DIAGNOSIS — E1140 Type 2 diabetes mellitus with diabetic neuropathy, unspecified: Secondary | ICD-10-CM | POA: Diagnosis not present

## 2016-08-06 DIAGNOSIS — E7800 Pure hypercholesterolemia, unspecified: Secondary | ICD-10-CM | POA: Diagnosis not present

## 2016-09-04 DIAGNOSIS — M5441 Lumbago with sciatica, right side: Secondary | ICD-10-CM | POA: Diagnosis not present

## 2016-09-04 DIAGNOSIS — G603 Idiopathic progressive neuropathy: Secondary | ICD-10-CM | POA: Diagnosis not present

## 2016-09-04 DIAGNOSIS — G5603 Carpal tunnel syndrome, bilateral upper limbs: Secondary | ICD-10-CM | POA: Diagnosis not present

## 2016-09-04 DIAGNOSIS — Z79899 Other long term (current) drug therapy: Secondary | ICD-10-CM | POA: Diagnosis not present

## 2016-09-04 DIAGNOSIS — M5412 Radiculopathy, cervical region: Secondary | ICD-10-CM | POA: Diagnosis not present

## 2016-09-11 DIAGNOSIS — Z5181 Encounter for therapeutic drug level monitoring: Secondary | ICD-10-CM | POA: Diagnosis not present

## 2016-09-30 DIAGNOSIS — E538 Deficiency of other specified B group vitamins: Secondary | ICD-10-CM | POA: Diagnosis not present

## 2016-09-30 DIAGNOSIS — E291 Testicular hypofunction: Secondary | ICD-10-CM | POA: Diagnosis not present

## 2016-11-14 DIAGNOSIS — K219 Gastro-esophageal reflux disease without esophagitis: Secondary | ICD-10-CM | POA: Diagnosis not present

## 2016-11-14 DIAGNOSIS — G629 Polyneuropathy, unspecified: Secondary | ICD-10-CM | POA: Diagnosis not present

## 2016-11-14 DIAGNOSIS — E7800 Pure hypercholesterolemia, unspecified: Secondary | ICD-10-CM | POA: Diagnosis not present

## 2016-11-14 DIAGNOSIS — E1140 Type 2 diabetes mellitus with diabetic neuropathy, unspecified: Secondary | ICD-10-CM | POA: Diagnosis not present

## 2016-12-11 DIAGNOSIS — M5417 Radiculopathy, lumbosacral region: Secondary | ICD-10-CM | POA: Diagnosis not present

## 2016-12-11 DIAGNOSIS — G5603 Carpal tunnel syndrome, bilateral upper limbs: Secondary | ICD-10-CM | POA: Diagnosis not present

## 2016-12-11 DIAGNOSIS — G603 Idiopathic progressive neuropathy: Secondary | ICD-10-CM | POA: Diagnosis not present

## 2016-12-11 DIAGNOSIS — Z79899 Other long term (current) drug therapy: Secondary | ICD-10-CM | POA: Diagnosis not present

## 2016-12-11 DIAGNOSIS — M5441 Lumbago with sciatica, right side: Secondary | ICD-10-CM | POA: Diagnosis not present

## 2017-01-15 DIAGNOSIS — Z952 Presence of prosthetic heart valve: Secondary | ICD-10-CM | POA: Diagnosis not present

## 2017-01-15 DIAGNOSIS — Z953 Presence of xenogenic heart valve: Secondary | ICD-10-CM | POA: Diagnosis not present

## 2017-01-15 DIAGNOSIS — I359 Nonrheumatic aortic valve disorder, unspecified: Secondary | ICD-10-CM | POA: Diagnosis not present

## 2017-01-15 DIAGNOSIS — I071 Rheumatic tricuspid insufficiency: Secondary | ICD-10-CM | POA: Diagnosis not present

## 2017-01-15 DIAGNOSIS — E291 Testicular hypofunction: Secondary | ICD-10-CM | POA: Diagnosis not present

## 2017-01-15 DIAGNOSIS — I517 Cardiomegaly: Secondary | ICD-10-CM | POA: Diagnosis not present

## 2017-01-15 DIAGNOSIS — E538 Deficiency of other specified B group vitamins: Secondary | ICD-10-CM | POA: Diagnosis not present

## 2017-01-26 DIAGNOSIS — Z952 Presence of prosthetic heart valve: Secondary | ICD-10-CM | POA: Diagnosis not present

## 2017-01-26 DIAGNOSIS — I359 Nonrheumatic aortic valve disorder, unspecified: Secondary | ICD-10-CM | POA: Diagnosis not present

## 2017-03-16 DIAGNOSIS — J9801 Acute bronchospasm: Secondary | ICD-10-CM | POA: Diagnosis not present

## 2017-03-16 DIAGNOSIS — J069 Acute upper respiratory infection, unspecified: Secondary | ICD-10-CM | POA: Diagnosis not present

## 2017-03-18 DIAGNOSIS — E291 Testicular hypofunction: Secondary | ICD-10-CM | POA: Diagnosis not present

## 2017-03-18 DIAGNOSIS — E538 Deficiency of other specified B group vitamins: Secondary | ICD-10-CM | POA: Diagnosis not present

## 2017-03-19 DIAGNOSIS — G603 Idiopathic progressive neuropathy: Secondary | ICD-10-CM | POA: Diagnosis not present

## 2017-03-19 DIAGNOSIS — M5412 Radiculopathy, cervical region: Secondary | ICD-10-CM | POA: Diagnosis not present

## 2017-03-19 DIAGNOSIS — M5441 Lumbago with sciatica, right side: Secondary | ICD-10-CM | POA: Diagnosis not present

## 2017-03-19 DIAGNOSIS — R270 Ataxia, unspecified: Secondary | ICD-10-CM | POA: Diagnosis not present

## 2017-04-06 DIAGNOSIS — E538 Deficiency of other specified B group vitamins: Secondary | ICD-10-CM | POA: Diagnosis not present

## 2017-04-06 DIAGNOSIS — I700 Atherosclerosis of aorta: Secondary | ICD-10-CM | POA: Diagnosis not present

## 2017-04-06 DIAGNOSIS — Z0000 Encounter for general adult medical examination without abnormal findings: Secondary | ICD-10-CM | POA: Diagnosis not present

## 2017-04-06 DIAGNOSIS — E7800 Pure hypercholesterolemia, unspecified: Secondary | ICD-10-CM | POA: Diagnosis not present

## 2017-04-06 DIAGNOSIS — K5790 Diverticulosis of intestine, part unspecified, without perforation or abscess without bleeding: Secondary | ICD-10-CM | POA: Diagnosis not present

## 2017-04-06 DIAGNOSIS — E1140 Type 2 diabetes mellitus with diabetic neuropathy, unspecified: Secondary | ICD-10-CM | POA: Diagnosis not present

## 2017-04-06 DIAGNOSIS — E291 Testicular hypofunction: Secondary | ICD-10-CM | POA: Diagnosis not present

## 2017-04-06 DIAGNOSIS — Z125 Encounter for screening for malignant neoplasm of prostate: Secondary | ICD-10-CM | POA: Diagnosis not present

## 2017-04-06 DIAGNOSIS — R5383 Other fatigue: Secondary | ICD-10-CM | POA: Diagnosis not present

## 2017-04-20 DIAGNOSIS — Z23 Encounter for immunization: Secondary | ICD-10-CM | POA: Diagnosis not present

## 2017-04-20 DIAGNOSIS — E538 Deficiency of other specified B group vitamins: Secondary | ICD-10-CM | POA: Diagnosis not present

## 2017-06-11 DIAGNOSIS — E538 Deficiency of other specified B group vitamins: Secondary | ICD-10-CM | POA: Diagnosis not present

## 2017-06-25 DIAGNOSIS — G5633 Lesion of radial nerve, bilateral upper limbs: Secondary | ICD-10-CM | POA: Diagnosis not present

## 2017-06-25 DIAGNOSIS — G5603 Carpal tunnel syndrome, bilateral upper limbs: Secondary | ICD-10-CM | POA: Diagnosis not present

## 2017-06-25 DIAGNOSIS — G603 Idiopathic progressive neuropathy: Secondary | ICD-10-CM | POA: Diagnosis not present

## 2017-06-25 DIAGNOSIS — R270 Ataxia, unspecified: Secondary | ICD-10-CM | POA: Diagnosis not present

## 2017-06-25 DIAGNOSIS — M5417 Radiculopathy, lumbosacral region: Secondary | ICD-10-CM | POA: Diagnosis not present

## 2017-06-26 DIAGNOSIS — E291 Testicular hypofunction: Secondary | ICD-10-CM | POA: Diagnosis not present

## 2017-06-28 DIAGNOSIS — R300 Dysuria: Secondary | ICD-10-CM | POA: Diagnosis not present

## 2017-07-01 DIAGNOSIS — R319 Hematuria, unspecified: Secondary | ICD-10-CM | POA: Diagnosis not present

## 2017-07-09 ENCOUNTER — Other Ambulatory Visit: Payer: Self-pay | Admitting: Family Medicine

## 2017-07-09 DIAGNOSIS — E291 Testicular hypofunction: Secondary | ICD-10-CM | POA: Diagnosis not present

## 2017-07-09 DIAGNOSIS — E538 Deficiency of other specified B group vitamins: Secondary | ICD-10-CM | POA: Diagnosis not present

## 2017-07-09 DIAGNOSIS — R319 Hematuria, unspecified: Secondary | ICD-10-CM

## 2017-07-14 ENCOUNTER — Ambulatory Visit
Admission: RE | Admit: 2017-07-14 | Discharge: 2017-07-14 | Disposition: A | Discharge status: Home / Self Care | Payer: Medicare Other | Source: Ambulatory Visit | Attending: Family Medicine | Admitting: Family Medicine

## 2017-07-14 DIAGNOSIS — N200 Calculus of kidney: Secondary | ICD-10-CM | POA: Diagnosis not present

## 2017-07-14 DIAGNOSIS — R319 Hematuria, unspecified: Secondary | ICD-10-CM

## 2017-07-14 MED ORDER — IOPAMIDOL (ISOVUE-300) INJECTION 61%
100.0000 mL | Freq: Once | INTRAVENOUS | Status: AC | PRN
  Administered 2017-07-14: 100 mL via INTRAVENOUS

## 2017-07-15 IMAGING — CT CT ABD-PELV W/ CM
1 of 3 series · 13 of 32 positions shown, 18 images · IV contrast (iopamidol)
Comparison: CT chest [DATE]

CLINICAL DATA: Hematuria, lower back pain, rt flank pain x 2 weeks
Hx of chole., aortic valve replacement x2 No hx of cancer

EXAM:
CT ABDOMEN AND PELVIS WITH CONTRAST
TECHNIQUE: Multidetector CT imaging of the abdomen and pelvis was performed
using the standard protocol following bolus administration of
intravenous contrast.
CONTRAST:  100mL [M8] IOPAMIDOL ([M8]) INJECTION 61%
Creatinine was obtained on site at [HOSPITAL] at [HOSPITAL].
Results: Creatinine all mg/dL.  BUN 11.  GFR 76.

[Series 2: abd/pelvis w/cm · axial · 0.94mm/px · z∈[+691,+1156]mm · 13 of 105 slices shown, 18 images]
[im 6/105  soft-tissue]
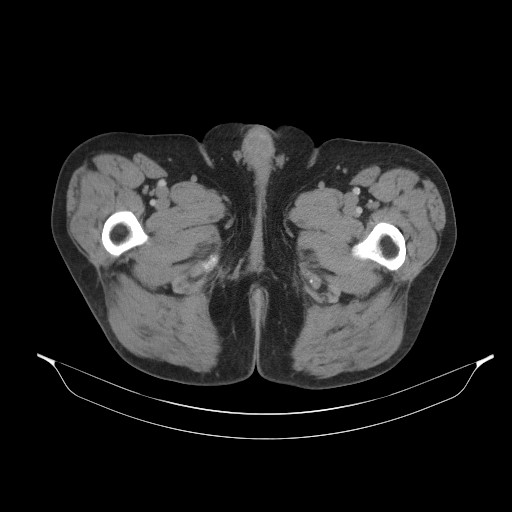
[im 6/105  bone]
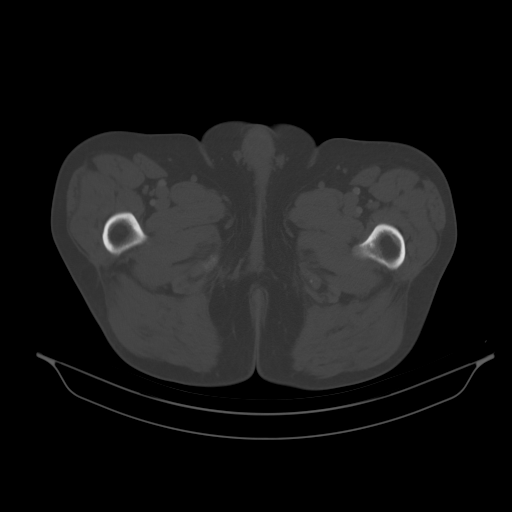
[im 16/105  soft-tissue]
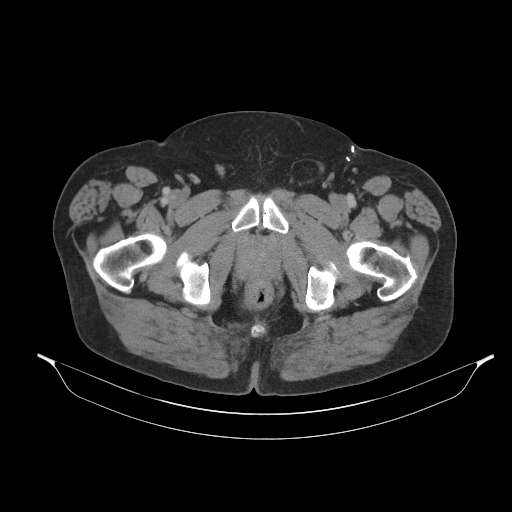
[im 21/105  soft-tissue]
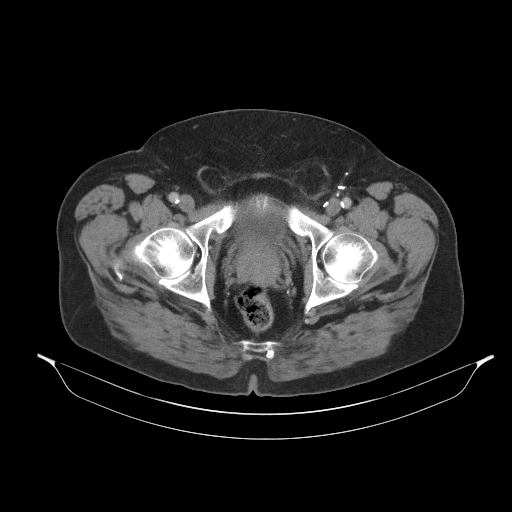
[im 32/105  soft-tissue]
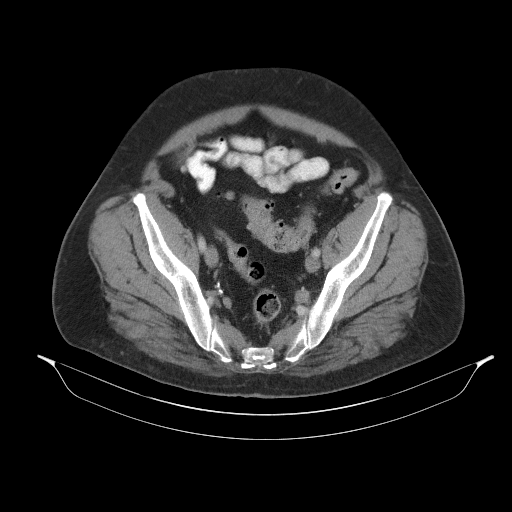
[im 42/105  soft-tissue]
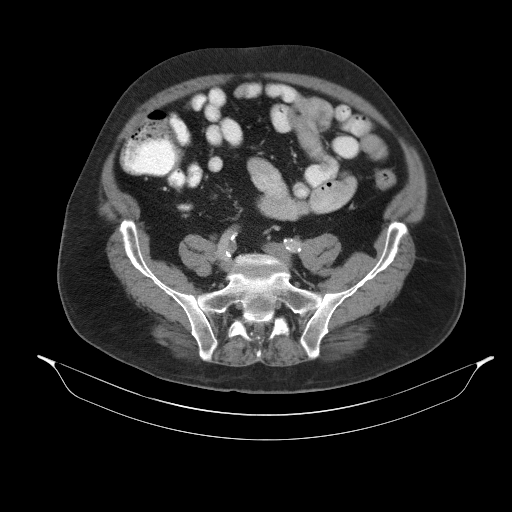
[im 47/105  soft-tissue]
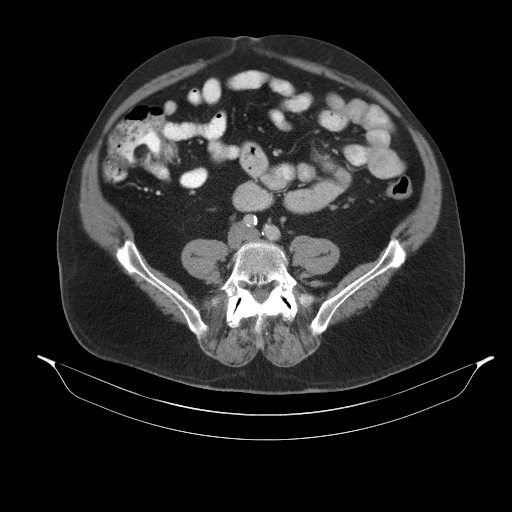
[im 58/105  soft-tissue]
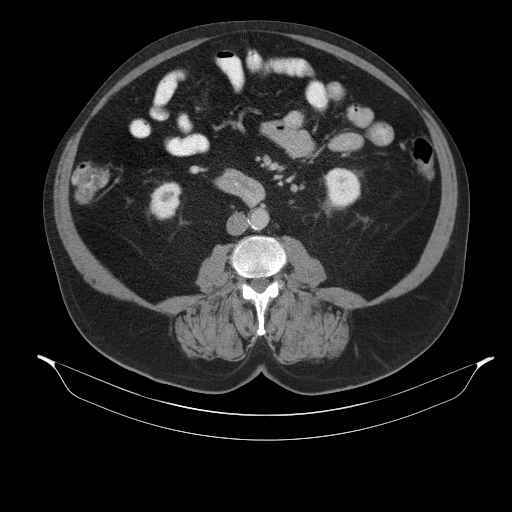
[im 63/105  soft-tissue]
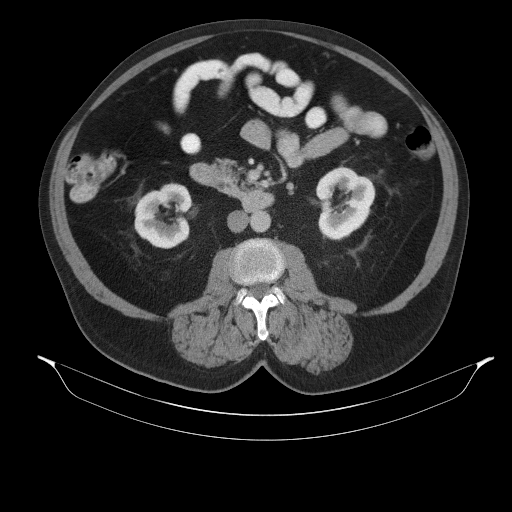
[im 73/105  soft-tissue]
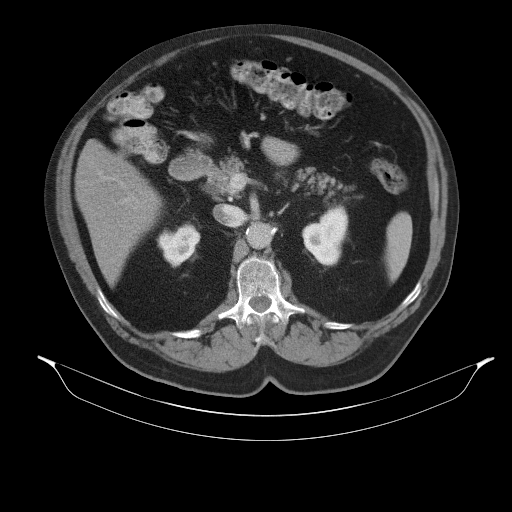
[im 73/105  bone]
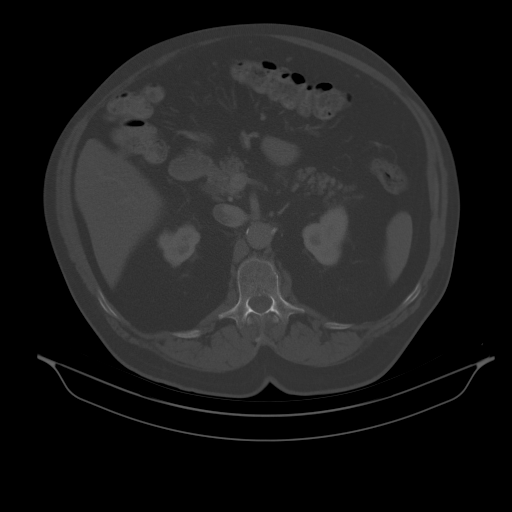
[im 84/105  soft-tissue]
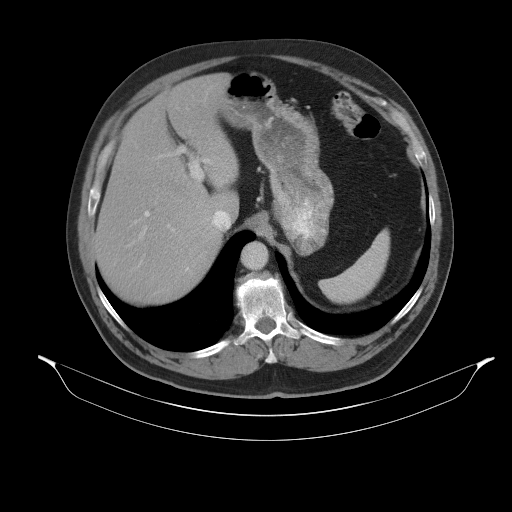
[im 84/105  lung]
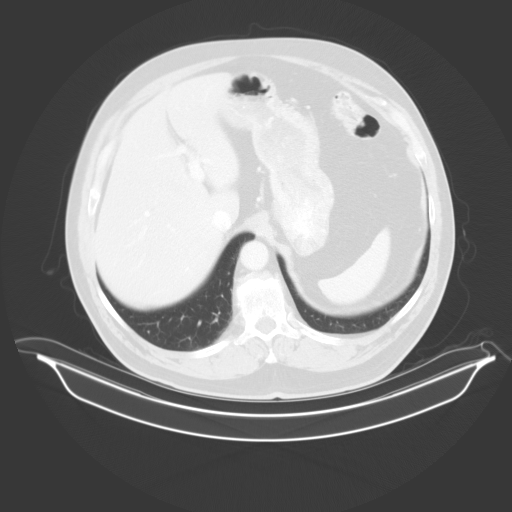
[im 89/105  soft-tissue]
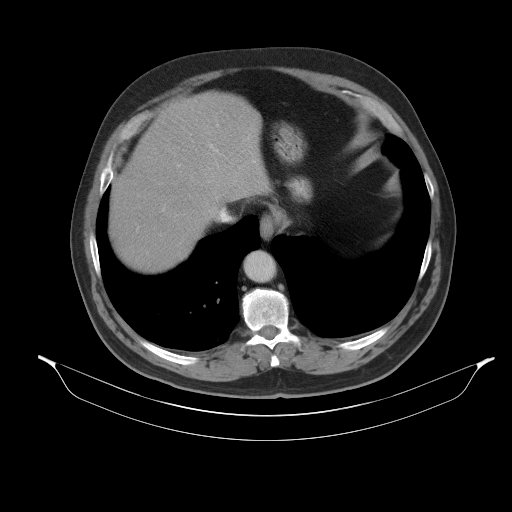
[im 89/105  lung]
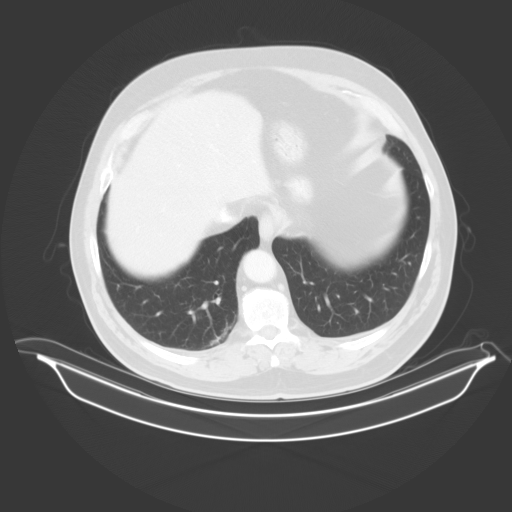
[im 94/105  lung]
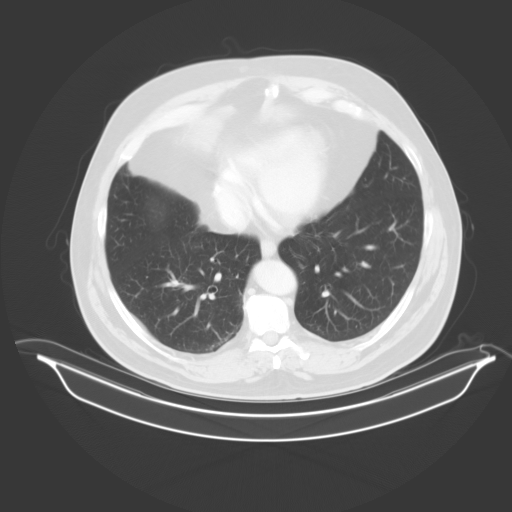
[im 99/105  soft-tissue]
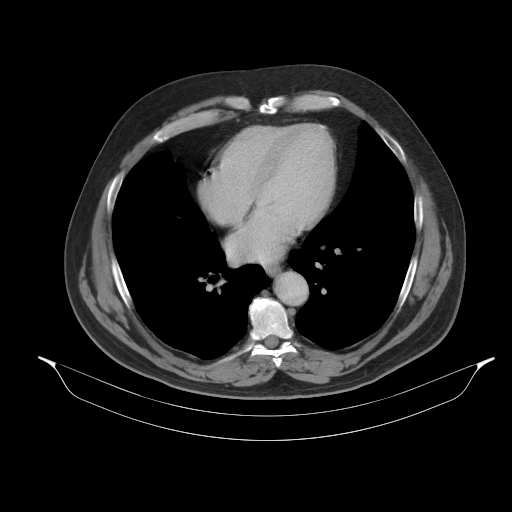
[im 99/105  lung]
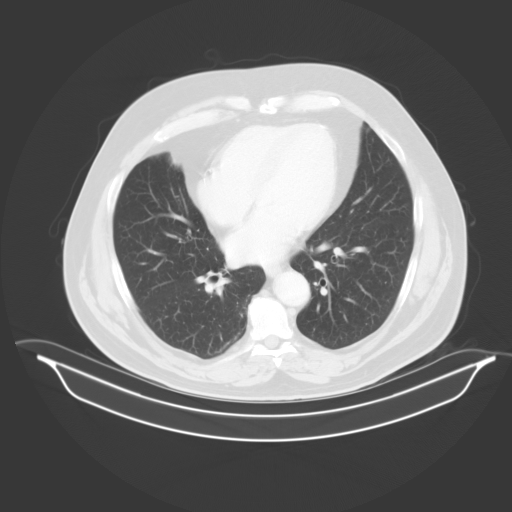

[13 of 32 positions shown; findings below may reference images not displayed]

FINDINGS: Lower chest: Previous median sternotomy and AVR. No pleural or
pericardial effusion. Subpleural scarring posteriorly right lower
lobe.

Hepatobiliary: No focal liver abnormality is seen. Status post
cholecystectomy. No biliary dilatation.

Pancreas: Unremarkable. No pancreatic ductal dilatation or
surrounding inflammatory changes.

Spleen: Normal in size without focal abnormality.

Adrenals/Urinary Tract: Normal adrenals. Bilateral nephrolithiasis,
largest stone on the left in the lower pole 0.4 cm, on the right in
the upper pole 0.3 cm. No hydronephrosis or ureterectasis. No focal
renal mass. Urinary bladder physiologically distended.

Stomach/Bowel: Stomach small bowel are nondilated. Normal appendix.
Colon is nondilated, with a few scattered sigmoid diverticula; no
adjacent inflammatory/ edematous change.

Vascular/Lymphatic: Surgical clips overlie the Left common femoral
vessels. scattered aortoiliac calcified plaque. No aneurysm or
stenosis. No abdominal or pelvic adenopathy localized. Portal vein
patent.

Reproductive: Prostatic enlargement with central coarse
calcifications.

Other: No ascites.  No free air.

Musculoskeletal: Bilateral inguinal hernias containing only
mesenteric fat, left greater than right. Facet DJD L4-5 and L5-S1.
Negative for fracture or worrisome bone lesion.
IMPRESSION: 1. No acute findings.
2. Bilateral nephrolithiasis without hydronephrosis or ureteral
calculus.
3. Sigmoid diverticulosis.
4. Postoperative and degenerative changes as above.

## 2017-07-31 DIAGNOSIS — E291 Testicular hypofunction: Secondary | ICD-10-CM | POA: Diagnosis not present

## 2017-08-06 DIAGNOSIS — J439 Emphysema, unspecified: Secondary | ICD-10-CM | POA: Diagnosis not present

## 2017-08-06 DIAGNOSIS — K5790 Diverticulosis of intestine, part unspecified, without perforation or abscess without bleeding: Secondary | ICD-10-CM | POA: Diagnosis not present

## 2017-08-06 DIAGNOSIS — E7800 Pure hypercholesterolemia, unspecified: Secondary | ICD-10-CM | POA: Diagnosis not present

## 2017-08-06 DIAGNOSIS — E1140 Type 2 diabetes mellitus with diabetic neuropathy, unspecified: Secondary | ICD-10-CM | POA: Diagnosis not present

## 2017-08-21 DIAGNOSIS — E538 Deficiency of other specified B group vitamins: Secondary | ICD-10-CM | POA: Diagnosis not present

## 2017-08-21 DIAGNOSIS — E7800 Pure hypercholesterolemia, unspecified: Secondary | ICD-10-CM | POA: Diagnosis not present

## 2017-08-21 DIAGNOSIS — E291 Testicular hypofunction: Secondary | ICD-10-CM | POA: Diagnosis not present

## 2017-08-21 DIAGNOSIS — E1140 Type 2 diabetes mellitus with diabetic neuropathy, unspecified: Secondary | ICD-10-CM | POA: Diagnosis not present

## 2017-09-11 DIAGNOSIS — R3121 Asymptomatic microscopic hematuria: Secondary | ICD-10-CM | POA: Diagnosis not present

## 2017-09-21 DIAGNOSIS — E291 Testicular hypofunction: Secondary | ICD-10-CM | POA: Diagnosis not present

## 2017-09-21 DIAGNOSIS — E538 Deficiency of other specified B group vitamins: Secondary | ICD-10-CM | POA: Diagnosis not present

## 2017-09-23 DIAGNOSIS — D123 Benign neoplasm of transverse colon: Secondary | ICD-10-CM | POA: Diagnosis not present

## 2017-09-23 DIAGNOSIS — Z86010 Personal history of colonic polyps: Secondary | ICD-10-CM | POA: Diagnosis not present

## 2017-09-23 DIAGNOSIS — D124 Benign neoplasm of descending colon: Secondary | ICD-10-CM | POA: Diagnosis not present

## 2017-09-23 DIAGNOSIS — K5730 Diverticulosis of large intestine without perforation or abscess without bleeding: Secondary | ICD-10-CM | POA: Diagnosis not present

## 2017-09-29 DIAGNOSIS — Z79899 Other long term (current) drug therapy: Secondary | ICD-10-CM | POA: Diagnosis not present

## 2017-09-29 DIAGNOSIS — G603 Idiopathic progressive neuropathy: Secondary | ICD-10-CM | POA: Diagnosis not present

## 2017-09-29 DIAGNOSIS — M5441 Lumbago with sciatica, right side: Secondary | ICD-10-CM | POA: Diagnosis not present

## 2017-09-29 DIAGNOSIS — M5412 Radiculopathy, cervical region: Secondary | ICD-10-CM | POA: Diagnosis not present

## 2017-09-29 DIAGNOSIS — R202 Paresthesia of skin: Secondary | ICD-10-CM | POA: Diagnosis not present

## 2017-10-19 DIAGNOSIS — E538 Deficiency of other specified B group vitamins: Secondary | ICD-10-CM | POA: Diagnosis not present

## 2017-10-19 DIAGNOSIS — E291 Testicular hypofunction: Secondary | ICD-10-CM | POA: Diagnosis not present

## 2017-10-26 DIAGNOSIS — H2513 Age-related nuclear cataract, bilateral: Secondary | ICD-10-CM | POA: Diagnosis not present

## 2017-10-26 DIAGNOSIS — H53001 Unspecified amblyopia, right eye: Secondary | ICD-10-CM | POA: Diagnosis not present

## 2017-10-26 DIAGNOSIS — E119 Type 2 diabetes mellitus without complications: Secondary | ICD-10-CM | POA: Diagnosis not present

## 2017-11-04 DIAGNOSIS — M25511 Pain in right shoulder: Secondary | ICD-10-CM | POA: Diagnosis not present

## 2017-11-04 DIAGNOSIS — M62830 Muscle spasm of back: Secondary | ICD-10-CM | POA: Diagnosis not present

## 2017-11-04 DIAGNOSIS — M545 Low back pain: Secondary | ICD-10-CM | POA: Diagnosis not present

## 2017-11-26 DIAGNOSIS — E291 Testicular hypofunction: Secondary | ICD-10-CM | POA: Diagnosis not present

## 2017-11-26 DIAGNOSIS — E538 Deficiency of other specified B group vitamins: Secondary | ICD-10-CM | POA: Diagnosis not present

## 2017-12-01 DIAGNOSIS — J439 Emphysema, unspecified: Secondary | ICD-10-CM | POA: Diagnosis not present

## 2017-12-01 DIAGNOSIS — E7800 Pure hypercholesterolemia, unspecified: Secondary | ICD-10-CM | POA: Diagnosis not present

## 2017-12-01 DIAGNOSIS — E1140 Type 2 diabetes mellitus with diabetic neuropathy, unspecified: Secondary | ICD-10-CM | POA: Diagnosis not present

## 2017-12-29 DIAGNOSIS — R202 Paresthesia of skin: Secondary | ICD-10-CM | POA: Diagnosis not present

## 2017-12-29 DIAGNOSIS — R5383 Other fatigue: Secondary | ICD-10-CM | POA: Diagnosis not present

## 2017-12-29 DIAGNOSIS — G603 Idiopathic progressive neuropathy: Secondary | ICD-10-CM | POA: Diagnosis not present

## 2017-12-29 DIAGNOSIS — R031 Nonspecific low blood-pressure reading: Secondary | ICD-10-CM | POA: Diagnosis not present

## 2017-12-29 DIAGNOSIS — R0989 Other specified symptoms and signs involving the circulatory and respiratory systems: Secondary | ICD-10-CM | POA: Diagnosis not present

## 2017-12-29 DIAGNOSIS — M542 Cervicalgia: Secondary | ICD-10-CM | POA: Diagnosis not present

## 2017-12-29 DIAGNOSIS — M5442 Lumbago with sciatica, left side: Secondary | ICD-10-CM | POA: Diagnosis not present

## 2017-12-29 DIAGNOSIS — R51 Headache: Secondary | ICD-10-CM | POA: Diagnosis not present

## 2017-12-29 DIAGNOSIS — M5441 Lumbago with sciatica, right side: Secondary | ICD-10-CM | POA: Diagnosis not present

## 2017-12-29 DIAGNOSIS — R52 Pain, unspecified: Secondary | ICD-10-CM | POA: Diagnosis not present

## 2017-12-29 DIAGNOSIS — Z79899 Other long term (current) drug therapy: Secondary | ICD-10-CM | POA: Diagnosis not present

## 2017-12-29 DIAGNOSIS — R112 Nausea with vomiting, unspecified: Secondary | ICD-10-CM | POA: Diagnosis not present

## 2018-01-21 DIAGNOSIS — E291 Testicular hypofunction: Secondary | ICD-10-CM | POA: Diagnosis not present

## 2018-01-21 DIAGNOSIS — E538 Deficiency of other specified B group vitamins: Secondary | ICD-10-CM | POA: Diagnosis not present

## 2018-02-08 DIAGNOSIS — L820 Inflamed seborrheic keratosis: Secondary | ICD-10-CM | POA: Diagnosis not present

## 2018-02-08 DIAGNOSIS — L218 Other seborrheic dermatitis: Secondary | ICD-10-CM | POA: Diagnosis not present

## 2018-02-15 DIAGNOSIS — J209 Acute bronchitis, unspecified: Secondary | ICD-10-CM | POA: Diagnosis not present

## 2018-02-15 DIAGNOSIS — J019 Acute sinusitis, unspecified: Secondary | ICD-10-CM | POA: Diagnosis not present

## 2018-02-15 DIAGNOSIS — J029 Acute pharyngitis, unspecified: Secondary | ICD-10-CM | POA: Diagnosis not present

## 2018-02-19 DIAGNOSIS — J9801 Acute bronchospasm: Secondary | ICD-10-CM | POA: Diagnosis not present

## 2018-02-19 DIAGNOSIS — J069 Acute upper respiratory infection, unspecified: Secondary | ICD-10-CM | POA: Diagnosis not present

## 2018-02-26 DIAGNOSIS — E538 Deficiency of other specified B group vitamins: Secondary | ICD-10-CM | POA: Diagnosis not present

## 2018-02-26 DIAGNOSIS — E291 Testicular hypofunction: Secondary | ICD-10-CM | POA: Diagnosis not present

## 2018-03-11 DIAGNOSIS — M5441 Lumbago with sciatica, right side: Secondary | ICD-10-CM | POA: Diagnosis not present

## 2018-03-11 DIAGNOSIS — G5603 Carpal tunnel syndrome, bilateral upper limbs: Secondary | ICD-10-CM | POA: Diagnosis not present

## 2018-03-11 DIAGNOSIS — G603 Idiopathic progressive neuropathy: Secondary | ICD-10-CM | POA: Diagnosis not present

## 2018-03-11 DIAGNOSIS — Z79899 Other long term (current) drug therapy: Secondary | ICD-10-CM | POA: Diagnosis not present

## 2018-03-11 DIAGNOSIS — M542 Cervicalgia: Secondary | ICD-10-CM | POA: Diagnosis not present

## 2018-04-01 DIAGNOSIS — Z0000 Encounter for general adult medical examination without abnormal findings: Secondary | ICD-10-CM | POA: Diagnosis not present

## 2018-04-01 DIAGNOSIS — E1140 Type 2 diabetes mellitus with diabetic neuropathy, unspecified: Secondary | ICD-10-CM | POA: Diagnosis not present

## 2018-04-01 DIAGNOSIS — E7800 Pure hypercholesterolemia, unspecified: Secondary | ICD-10-CM | POA: Diagnosis not present

## 2018-04-01 DIAGNOSIS — J439 Emphysema, unspecified: Secondary | ICD-10-CM | POA: Diagnosis not present

## 2018-04-01 DIAGNOSIS — E538 Deficiency of other specified B group vitamins: Secondary | ICD-10-CM | POA: Diagnosis not present

## 2018-04-01 DIAGNOSIS — R5383 Other fatigue: Secondary | ICD-10-CM | POA: Diagnosis not present

## 2018-06-15 DIAGNOSIS — M5441 Lumbago with sciatica, right side: Secondary | ICD-10-CM | POA: Diagnosis not present

## 2018-06-15 DIAGNOSIS — M542 Cervicalgia: Secondary | ICD-10-CM | POA: Diagnosis not present

## 2018-06-15 DIAGNOSIS — G603 Idiopathic progressive neuropathy: Secondary | ICD-10-CM | POA: Diagnosis not present

## 2018-06-15 DIAGNOSIS — G5603 Carpal tunnel syndrome, bilateral upper limbs: Secondary | ICD-10-CM | POA: Diagnosis not present

## 2018-06-15 DIAGNOSIS — R202 Paresthesia of skin: Secondary | ICD-10-CM | POA: Diagnosis not present

## 2018-07-15 DIAGNOSIS — E538 Deficiency of other specified B group vitamins: Secondary | ICD-10-CM | POA: Diagnosis not present

## 2018-07-26 DIAGNOSIS — E1140 Type 2 diabetes mellitus with diabetic neuropathy, unspecified: Secondary | ICD-10-CM | POA: Diagnosis not present

## 2018-07-26 DIAGNOSIS — E538 Deficiency of other specified B group vitamins: Secondary | ICD-10-CM | POA: Diagnosis not present

## 2018-07-26 DIAGNOSIS — J439 Emphysema, unspecified: Secondary | ICD-10-CM | POA: Diagnosis not present

## 2018-07-27 DIAGNOSIS — E7800 Pure hypercholesterolemia, unspecified: Secondary | ICD-10-CM | POA: Diagnosis not present

## 2018-07-27 DIAGNOSIS — E1140 Type 2 diabetes mellitus with diabetic neuropathy, unspecified: Secondary | ICD-10-CM | POA: Diagnosis not present

## 2018-08-17 DIAGNOSIS — M5441 Lumbago with sciatica, right side: Secondary | ICD-10-CM | POA: Diagnosis not present

## 2018-08-17 DIAGNOSIS — R202 Paresthesia of skin: Secondary | ICD-10-CM | POA: Diagnosis not present

## 2018-08-17 DIAGNOSIS — G603 Idiopathic progressive neuropathy: Secondary | ICD-10-CM | POA: Diagnosis not present

## 2018-08-17 DIAGNOSIS — R270 Ataxia, unspecified: Secondary | ICD-10-CM | POA: Diagnosis not present

## 2018-08-17 DIAGNOSIS — G5603 Carpal tunnel syndrome, bilateral upper limbs: Secondary | ICD-10-CM | POA: Diagnosis not present

## 2018-08-27 DIAGNOSIS — Z23 Encounter for immunization: Secondary | ICD-10-CM | POA: Diagnosis not present

## 2018-08-27 DIAGNOSIS — E538 Deficiency of other specified B group vitamins: Secondary | ICD-10-CM | POA: Diagnosis not present

## 2018-09-01 ENCOUNTER — Emergency Department (HOSPITAL_COMMUNITY): Payer: Medicare Other

## 2018-09-01 ENCOUNTER — Other Ambulatory Visit: Payer: Self-pay

## 2018-09-01 ENCOUNTER — Inpatient Hospital Stay (HOSPITAL_COMMUNITY)
Admission: EM | Admit: 2018-09-01 | Discharge: 2018-09-07 | DRG: 871 | Disposition: A | Discharge status: Home Health | Payer: Medicare Other | Attending: Internal Medicine | Admitting: Internal Medicine

## 2018-09-01 ENCOUNTER — Inpatient Hospital Stay (HOSPITAL_COMMUNITY): Payer: Medicare Other

## 2018-09-01 ENCOUNTER — Encounter (HOSPITAL_COMMUNITY): Payer: Self-pay

## 2018-09-01 DIAGNOSIS — E785 Hyperlipidemia, unspecified: Secondary | ICD-10-CM | POA: Diagnosis not present

## 2018-09-01 DIAGNOSIS — R197 Diarrhea, unspecified: Secondary | ICD-10-CM

## 2018-09-01 DIAGNOSIS — E538 Deficiency of other specified B group vitamins: Secondary | ICD-10-CM | POA: Diagnosis not present

## 2018-09-01 DIAGNOSIS — E11649 Type 2 diabetes mellitus with hypoglycemia without coma: Secondary | ICD-10-CM | POA: Diagnosis not present

## 2018-09-01 DIAGNOSIS — R7881 Bacteremia: Secondary | ICD-10-CM | POA: Diagnosis not present

## 2018-09-01 DIAGNOSIS — R001 Bradycardia, unspecified: Secondary | ICD-10-CM | POA: Diagnosis present

## 2018-09-01 DIAGNOSIS — D72829 Elevated white blood cell count, unspecified: Secondary | ICD-10-CM

## 2018-09-01 DIAGNOSIS — D751 Secondary polycythemia: Secondary | ICD-10-CM | POA: Diagnosis present

## 2018-09-01 DIAGNOSIS — E860 Dehydration: Secondary | ICD-10-CM | POA: Diagnosis present

## 2018-09-01 DIAGNOSIS — R509 Fever, unspecified: Secondary | ICD-10-CM

## 2018-09-01 DIAGNOSIS — Z9049 Acquired absence of other specified parts of digestive tract: Secondary | ICD-10-CM

## 2018-09-01 DIAGNOSIS — E876 Hypokalemia: Secondary | ICD-10-CM | POA: Diagnosis not present

## 2018-09-01 DIAGNOSIS — F17210 Nicotine dependence, cigarettes, uncomplicated: Secondary | ICD-10-CM | POA: Diagnosis present

## 2018-09-01 DIAGNOSIS — R945 Abnormal results of liver function studies: Secondary | ICD-10-CM | POA: Diagnosis present

## 2018-09-01 DIAGNOSIS — Z952 Presence of prosthetic heart valve: Secondary | ICD-10-CM | POA: Diagnosis not present

## 2018-09-01 DIAGNOSIS — Z8249 Family history of ischemic heart disease and other diseases of the circulatory system: Secondary | ICD-10-CM | POA: Diagnosis not present

## 2018-09-01 DIAGNOSIS — R9431 Abnormal electrocardiogram [ECG] [EKG]: Secondary | ICD-10-CM

## 2018-09-01 DIAGNOSIS — G9341 Metabolic encephalopathy: Secondary | ICD-10-CM | POA: Diagnosis present

## 2018-09-01 DIAGNOSIS — R4182 Altered mental status, unspecified: Secondary | ICD-10-CM | POA: Diagnosis not present

## 2018-09-01 DIAGNOSIS — Z7984 Long term (current) use of oral hypoglycemic drugs: Secondary | ICD-10-CM

## 2018-09-01 DIAGNOSIS — E119 Type 2 diabetes mellitus without complications: Secondary | ICD-10-CM

## 2018-09-01 DIAGNOSIS — R6520 Severe sepsis without septic shock: Secondary | ICD-10-CM | POA: Diagnosis not present

## 2018-09-01 DIAGNOSIS — E871 Hypo-osmolality and hyponatremia: Secondary | ICD-10-CM | POA: Diagnosis present

## 2018-09-01 DIAGNOSIS — Z79891 Long term (current) use of opiate analgesic: Secondary | ICD-10-CM

## 2018-09-01 DIAGNOSIS — Z79899 Other long term (current) drug therapy: Secondary | ICD-10-CM

## 2018-09-01 DIAGNOSIS — A419 Sepsis, unspecified organism: Secondary | ICD-10-CM | POA: Diagnosis not present

## 2018-09-01 DIAGNOSIS — F419 Anxiety disorder, unspecified: Secondary | ICD-10-CM | POA: Diagnosis present

## 2018-09-01 DIAGNOSIS — Z833 Family history of diabetes mellitus: Secondary | ICD-10-CM | POA: Diagnosis not present

## 2018-09-01 DIAGNOSIS — R0689 Other abnormalities of breathing: Secondary | ICD-10-CM | POA: Diagnosis not present

## 2018-09-01 DIAGNOSIS — R0902 Hypoxemia: Secondary | ICD-10-CM | POA: Diagnosis not present

## 2018-09-01 DIAGNOSIS — I351 Nonrheumatic aortic (valve) insufficiency: Secondary | ICD-10-CM | POA: Diagnosis not present

## 2018-09-01 DIAGNOSIS — I10 Essential (primary) hypertension: Secondary | ICD-10-CM | POA: Diagnosis not present

## 2018-09-01 DIAGNOSIS — R7989 Other specified abnormal findings of blood chemistry: Secondary | ICD-10-CM | POA: Diagnosis present

## 2018-09-01 DIAGNOSIS — R4020 Unspecified coma: Secondary | ICD-10-CM | POA: Diagnosis not present

## 2018-09-01 DIAGNOSIS — B9561 Methicillin susceptible Staphylococcus aureus infection as the cause of diseases classified elsewhere: Secondary | ICD-10-CM

## 2018-09-01 DIAGNOSIS — R404 Transient alteration of awareness: Secondary | ICD-10-CM | POA: Diagnosis not present

## 2018-09-01 DIAGNOSIS — Z0389 Encounter for observation for other suspected diseases and conditions ruled out: Secondary | ICD-10-CM | POA: Diagnosis not present

## 2018-09-01 HISTORY — DX: Dorsalgia, unspecified: M54.9

## 2018-09-01 HISTORY — DX: Endocrine disorder, unspecified: E34.9

## 2018-09-01 HISTORY — DX: Anxiety disorder, unspecified: F41.9

## 2018-09-01 HISTORY — DX: Hyperlipidemia, unspecified: E78.5

## 2018-09-01 HISTORY — DX: Deficiency of other specified B group vitamins: E53.8

## 2018-09-01 HISTORY — DX: Type 2 diabetes mellitus without complications: E11.9

## 2018-09-01 LAB — CSF CELL COUNT WITH DIFFERENTIAL
RBC Count, CSF: 2 /mm3 — ABNORMAL HIGH
RBC Count, CSF: 2 /mm3 — ABNORMAL HIGH
Tube #: 1
Tube #: 4
WBC, CSF: 2 /mm3 (ref 0–5)
WBC, CSF: 3 /mm3 (ref 0–5)

## 2018-09-01 LAB — CBC WITH DIFFERENTIAL/PLATELET
Abs Immature Granulocytes: 0.16 10*3/uL — ABNORMAL HIGH (ref 0.00–0.07)
BASOS PCT: 0 %
Basophils Absolute: 0.1 10*3/uL (ref 0.0–0.1)
Eosinophils Absolute: 0.1 10*3/uL (ref 0.0–0.5)
Eosinophils Relative: 1 %
HCT: 52.2 % — ABNORMAL HIGH (ref 39.0–52.0)
Hemoglobin: 17.6 g/dL — ABNORMAL HIGH (ref 13.0–17.0)
Immature Granulocytes: 1 %
Lymphocytes Relative: 3 %
Lymphs Abs: 0.4 10*3/uL — ABNORMAL LOW (ref 0.7–4.0)
MCH: 30.1 pg (ref 26.0–34.0)
MCHC: 33.7 g/dL (ref 30.0–36.0)
MCV: 89.2 fL (ref 80.0–100.0)
Monocytes Absolute: 0.6 10*3/uL (ref 0.1–1.0)
Monocytes Relative: 4 %
NRBC: 0 % (ref 0.0–0.2)
Neutro Abs: 14 10*3/uL — ABNORMAL HIGH (ref 1.7–7.7)
Neutrophils Relative %: 91 %
Platelets: 158 10*3/uL (ref 150–400)
RBC: 5.85 MIL/uL — ABNORMAL HIGH (ref 4.22–5.81)
RDW: 14.1 % (ref 11.5–15.5)
WBC: 15.2 10*3/uL — AB (ref 4.0–10.5)

## 2018-09-01 LAB — URINALYSIS, ROUTINE W REFLEX MICROSCOPIC
Bilirubin Urine: NEGATIVE
Glucose, UA: NEGATIVE mg/dL
Ketones, ur: 20 mg/dL — AB
Leukocytes, UA: NEGATIVE
NITRITE: NEGATIVE
Protein, ur: 100 mg/dL — AB
SPECIFIC GRAVITY, URINE: 1.025 (ref 1.005–1.030)
pH: 5 (ref 5.0–8.0)

## 2018-09-01 LAB — BASIC METABOLIC PANEL
Anion gap: 7 (ref 5–15)
BUN: 22 mg/dL (ref 8–23)
CO2: 23 mmol/L (ref 22–32)
Calcium: 7.2 mg/dL — ABNORMAL LOW (ref 8.9–10.3)
Chloride: 106 mmol/L (ref 98–111)
Creatinine, Ser: 0.98 mg/dL (ref 0.61–1.24)
GFR calc Af Amer: >60 mL/min (ref >60)
GFR calc non Af Amer: >60 mL/min (ref >60)
Glucose, Bld: 152 mg/dL — ABNORMAL HIGH (ref 70–99)
Potassium: 3.2 mmol/L — ABNORMAL LOW (ref 3.5–5.1)
SODIUM: 136 mmol/L (ref 135–145)

## 2018-09-01 LAB — ECHOCARDIOGRAM COMPLETE: Weight: 3319.25 oz

## 2018-09-01 LAB — COMPREHENSIVE METABOLIC PANEL
ALT: 57 U/L — ABNORMAL HIGH (ref 0–44)
AST: 46 U/L — ABNORMAL HIGH (ref 15–41)
Albumin: 3.4 g/dL — ABNORMAL LOW (ref 3.5–5.0)
Alkaline Phosphatase: 72 U/L (ref 38–126)
Anion gap: 13 (ref 5–15)
BUN: 25 mg/dL — ABNORMAL HIGH (ref 8–23)
CO2: 19 mmol/L — ABNORMAL LOW (ref 22–32)
Calcium: 8.6 mg/dL — ABNORMAL LOW (ref 8.9–10.3)
Chloride: 99 mmol/L (ref 98–111)
Creatinine, Ser: 0.98 mg/dL (ref 0.61–1.24)
Glucose, Bld: 176 mg/dL — ABNORMAL HIGH (ref 70–99)
Potassium: 3.8 mmol/L (ref 3.5–5.1)
Sodium: 131 mmol/L — ABNORMAL LOW (ref 135–145)
Total Bilirubin: 2 mg/dL — ABNORMAL HIGH (ref 0.3–1.2)
Total Protein: 7 g/dL (ref 6.5–8.1)

## 2018-09-01 LAB — GLUCOSE, CAPILLARY
Glucose-Capillary: 184 mg/dL — ABNORMAL HIGH (ref 70–99)
Glucose-Capillary: 125 mg/dL — ABNORMAL HIGH (ref 70–99)
Glucose-Capillary: 95 mg/dL (ref 70–99)

## 2018-09-01 LAB — TROPONIN I
Troponin I: 0.03 ng/mL (ref <0.03)
Troponin I: 0.03 ng/mL (ref <0.03)

## 2018-09-01 LAB — MRSA PCR SCREENING: MRSA by PCR: NEGATIVE

## 2018-09-01 LAB — PHOSPHORUS: Phosphorus: 2.2 mg/dL — ABNORMAL LOW (ref 2.5–4.6)

## 2018-09-01 LAB — PROTEIN, CSF: TOTAL PROTEIN, CSF: 23 mg/dL (ref 15–45)

## 2018-09-01 LAB — INFLUENZA PANEL BY PCR (TYPE A & B)
Influenza A By PCR: NEGATIVE
Influenza B By PCR: NEGATIVE

## 2018-09-01 LAB — PROTIME-INR
INR: 1.08
Prothrombin Time: 13.9 seconds (ref 11.4–15.2)

## 2018-09-01 LAB — CBG MONITORING, ED: Glucose-Capillary: 193 mg/dL — ABNORMAL HIGH (ref 70–99)

## 2018-09-01 LAB — MAGNESIUM: Magnesium: 2.2 mg/dL (ref 1.7–2.4)

## 2018-09-01 LAB — GLUCOSE, CSF: Glucose, CSF: 102 mg/dL — ABNORMAL HIGH (ref 40–70)

## 2018-09-01 LAB — LACTIC ACID, PLASMA: Lactic Acid, Venous: 1.4 mmol/L (ref 0.5–1.9)

## 2018-09-01 IMAGING — CR DG CHEST 2V
2 series · 2 of 2 positions shown · non-contrast
Comparison: [DATE]

CLINICAL DATA: Suspected sepsis

EXAM:
CHEST - 2 VIEW

[w chest lat]
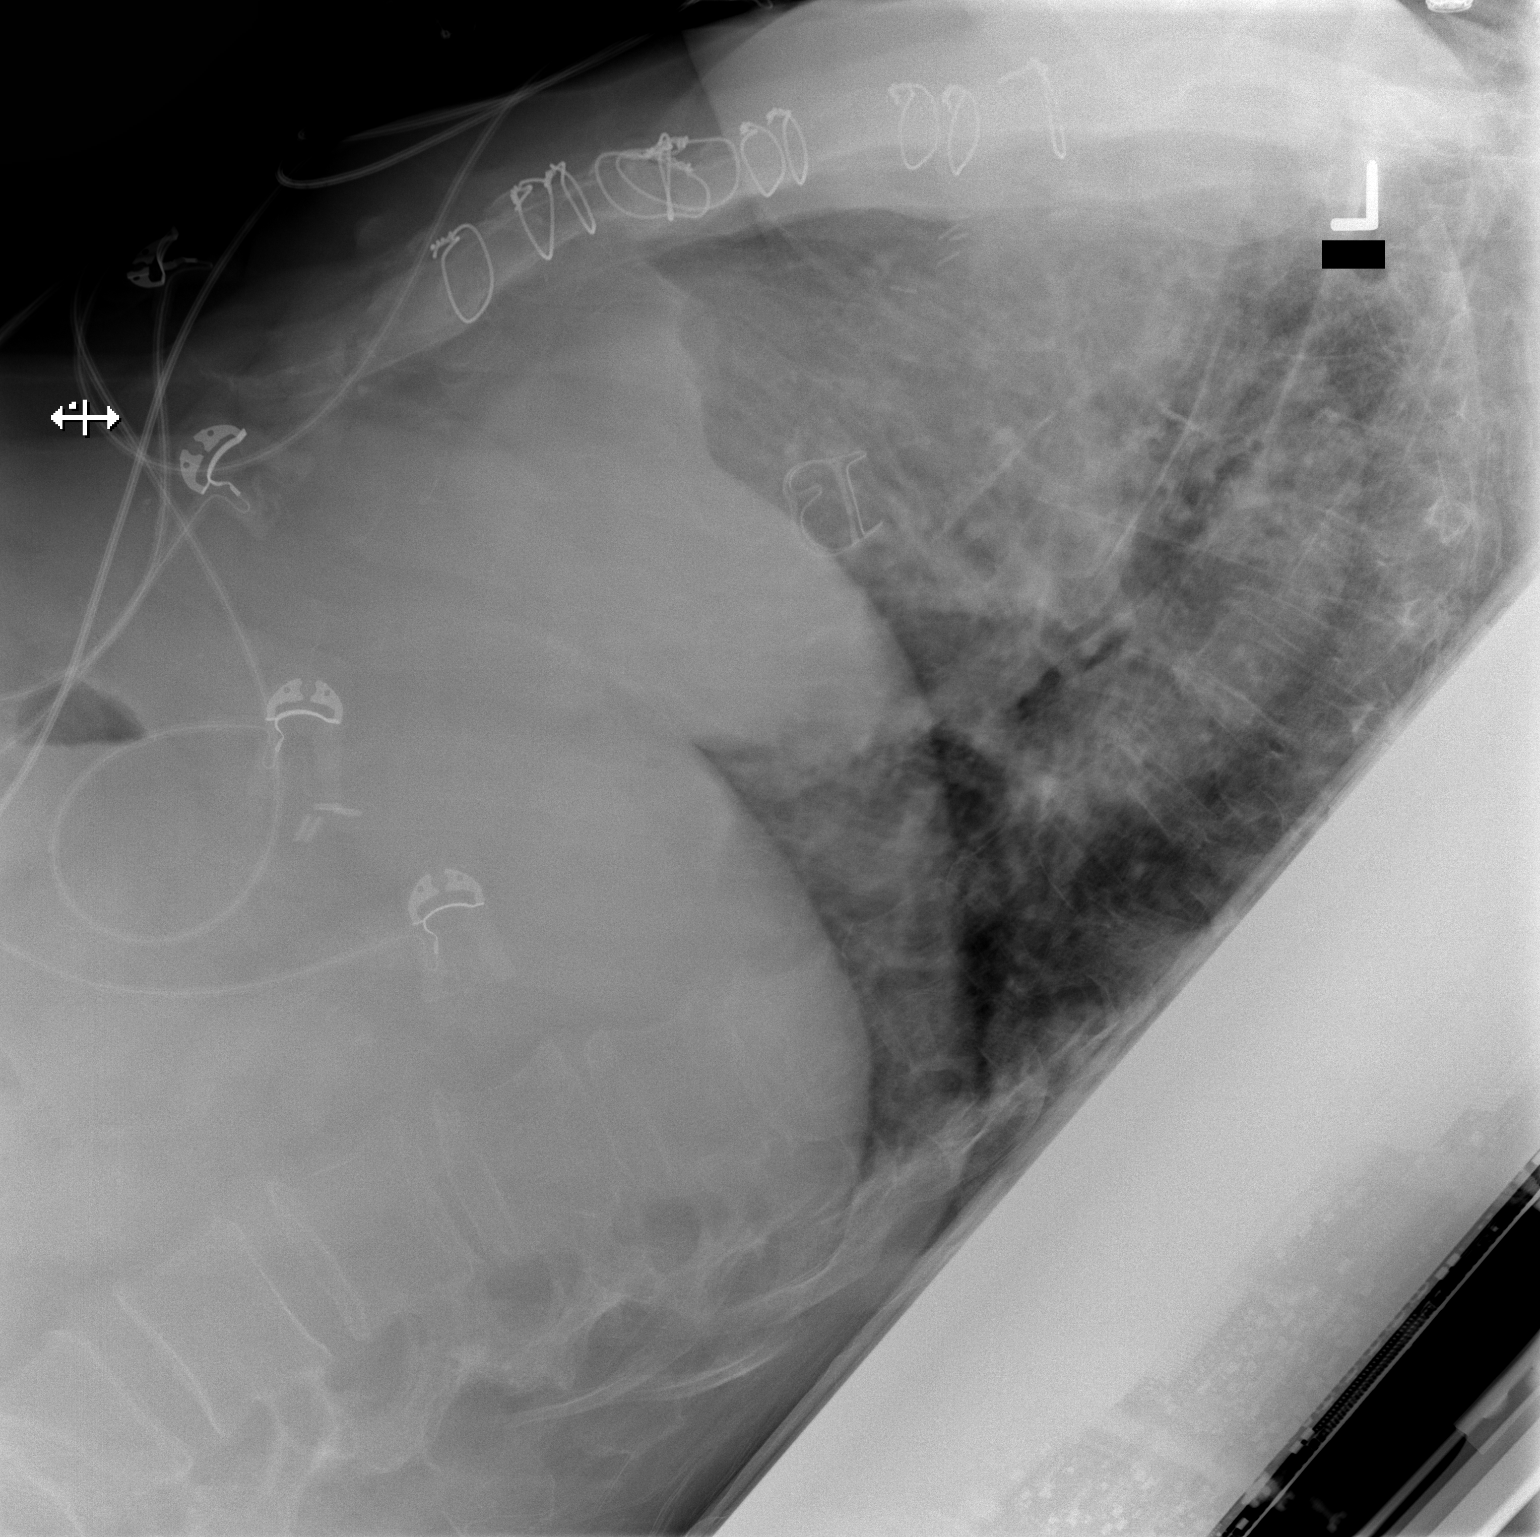

[x chest ap]
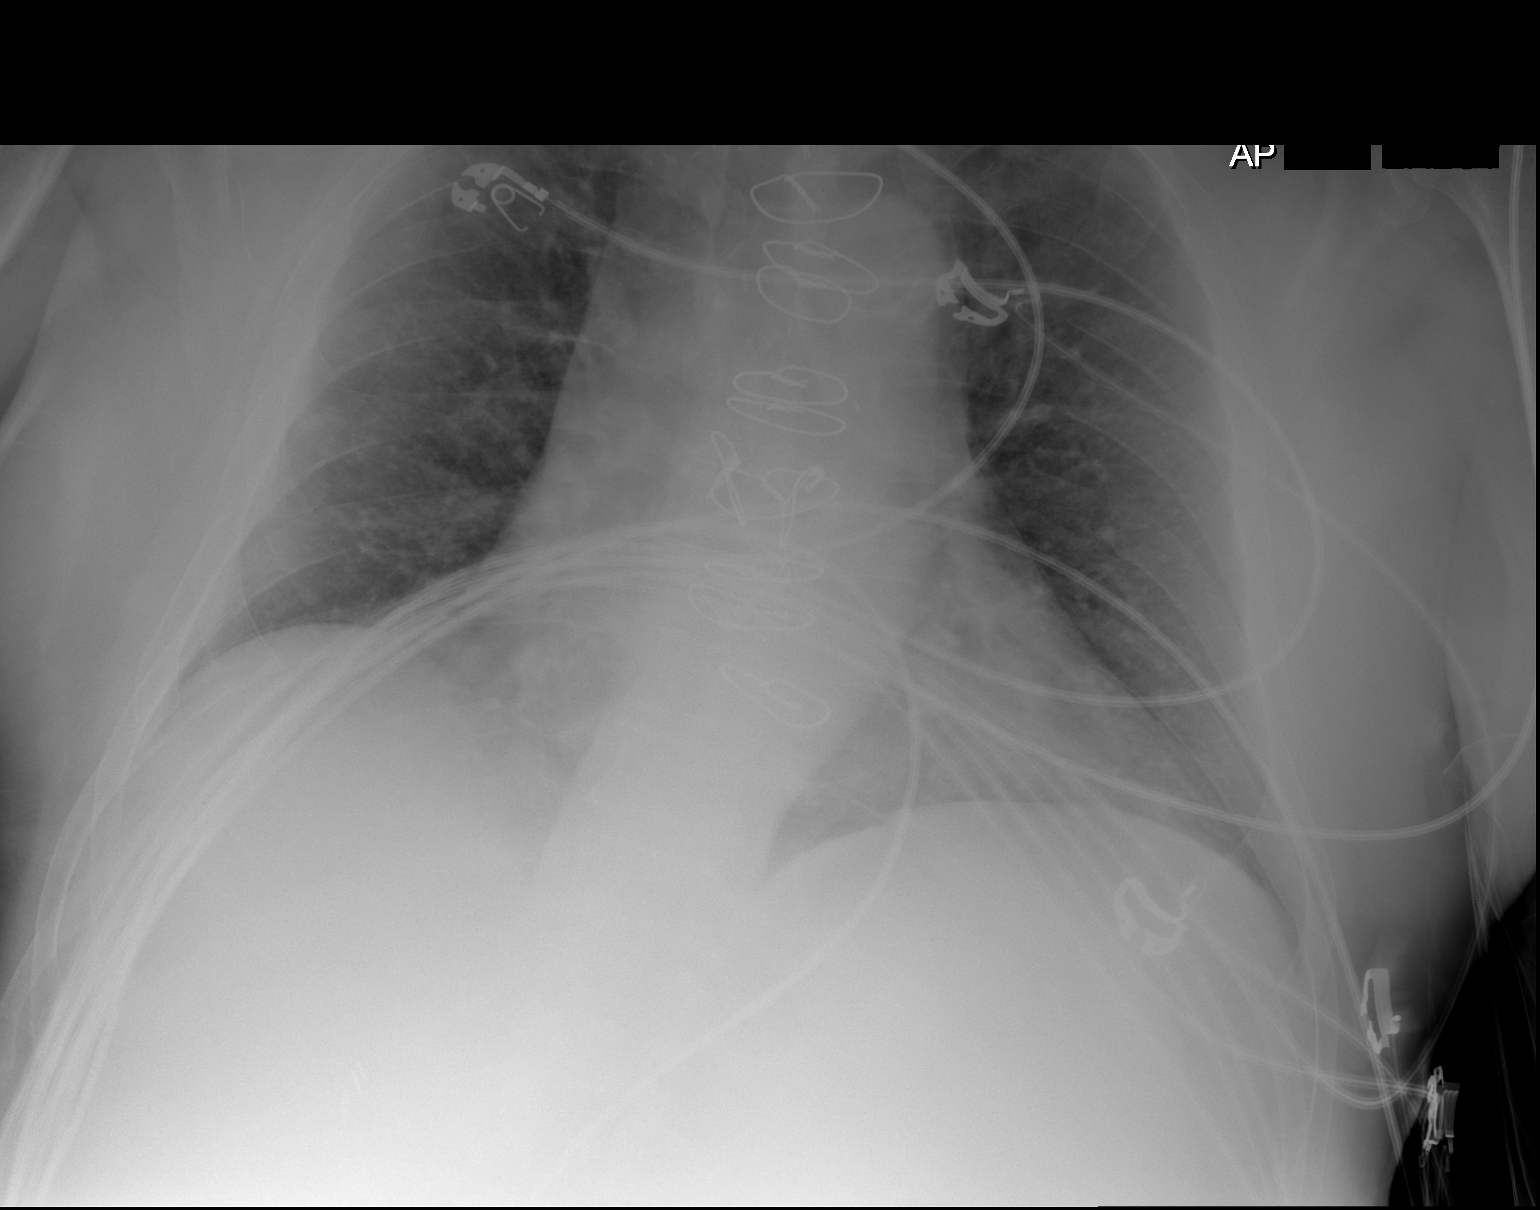

[2 of 2 positions shown; findings below may reference images not displayed]

FINDINGS: Normal heart size. Aortic tortuosity. Prior median sternotomy for
aortic valve replacement. Interstitial coarsening similar to prior.
There is no edema, consolidation, effusion, or pneumothorax.

Artifact from EKG leads.
IMPRESSION: No acute finding when compared to prior.

## 2018-09-01 IMAGING — CT CT HEAD W/O CM
3 of 6 series · 16 of 47 positions shown, 19 images · non-contrast
Comparison: None.

CLINICAL DATA: Altered level of consciousness

EXAM:
CT HEAD WITHOUT CONTRAST
TECHNIQUE: Contiguous axial images were obtained from the base of the skull
through the vertex without intravenous contrast.

[Series 2: head wo · axial · 0.47mm/px · z∈[-137,+13]mm · 11 of 34 slices shown, 14 images]
[im 2/34  brain]
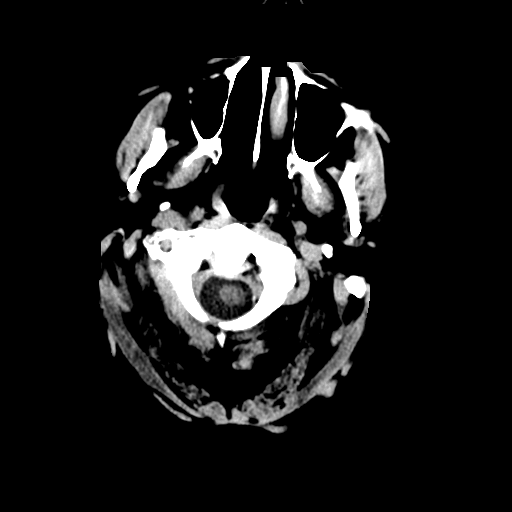
[im 2/34  bone]
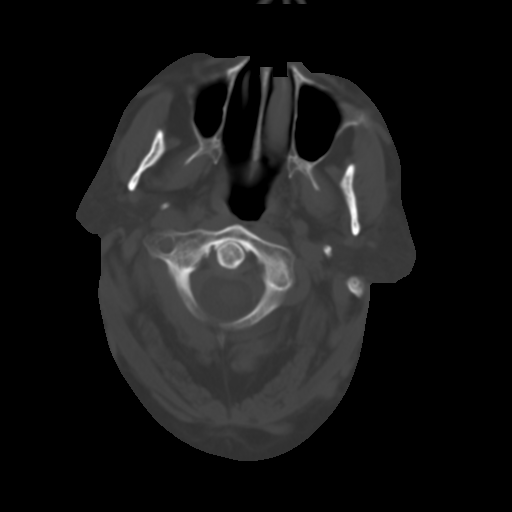
[im 6/34  brain]
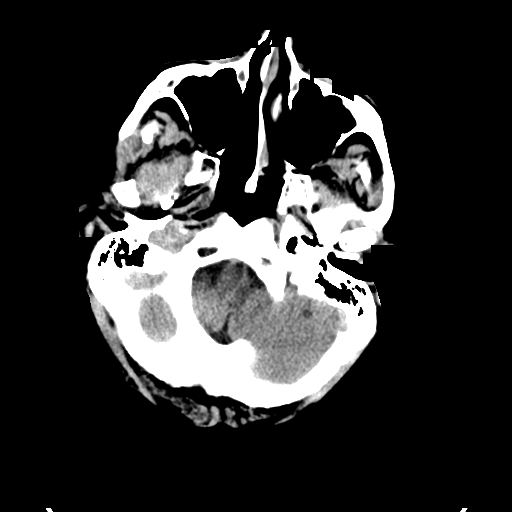
[im 8/34  brain]
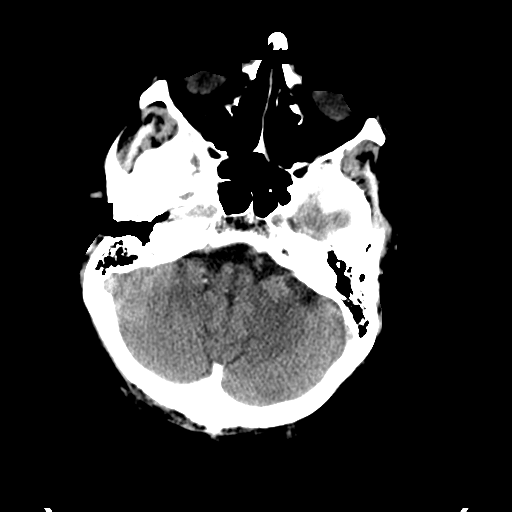
[im 12/34  brain]
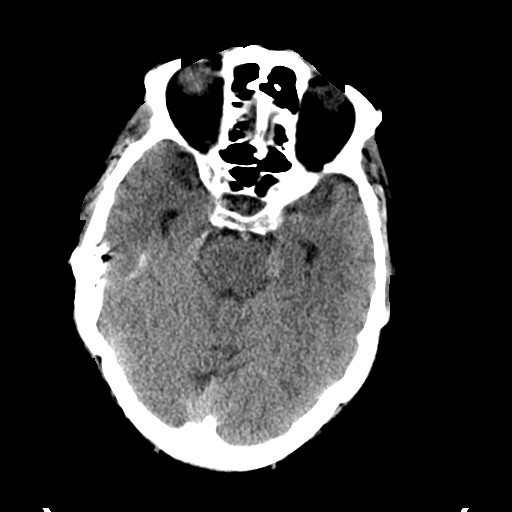
[im 13/34  brain]
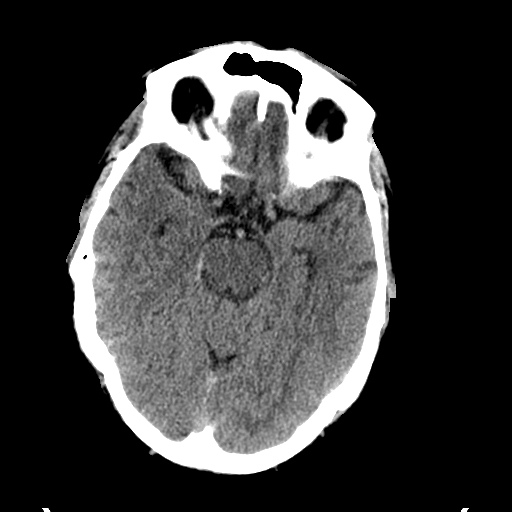
[im 13/34  bone]
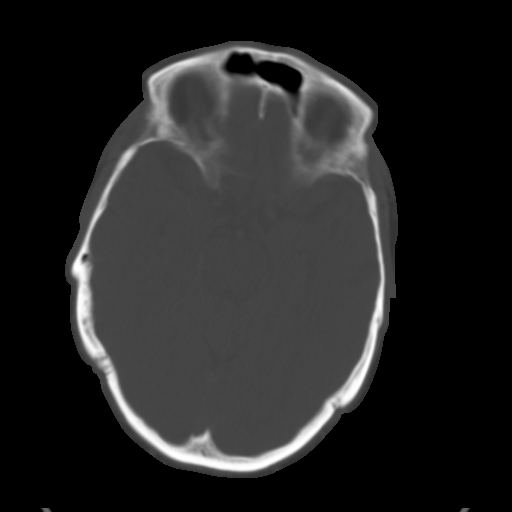
[im 17/34  brain]
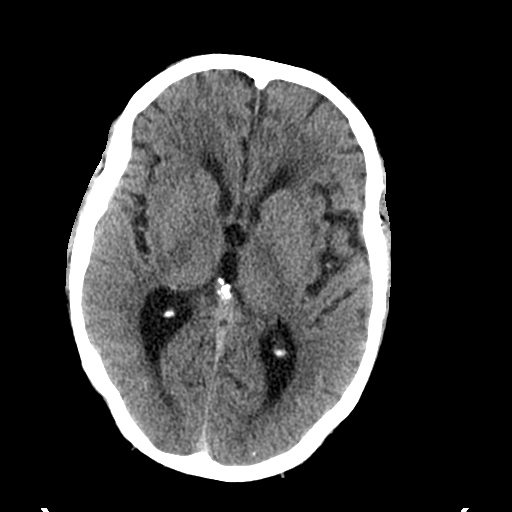
[im 21/34  brain]
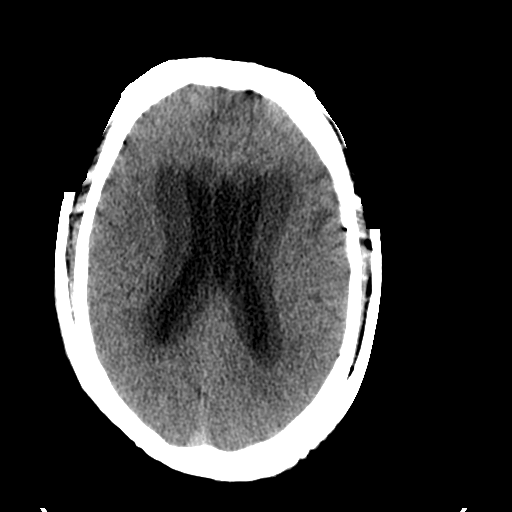
[im 23/34  brain]
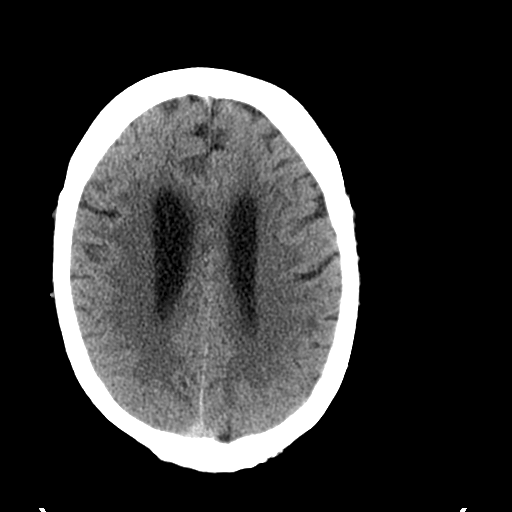
[im 26/34  brain]
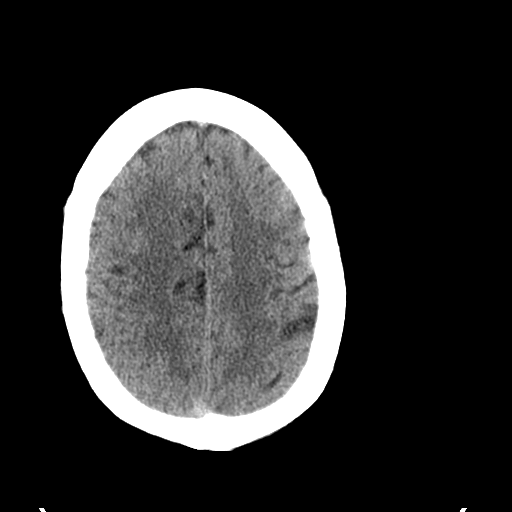
[im 26/34  bone]
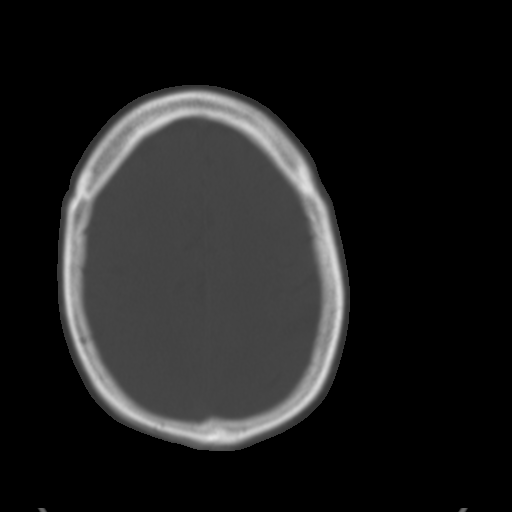
[im 28/34  brain]
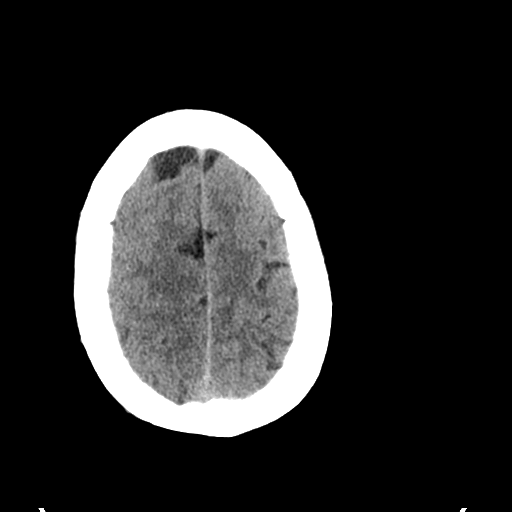
[im 32/34  brain]
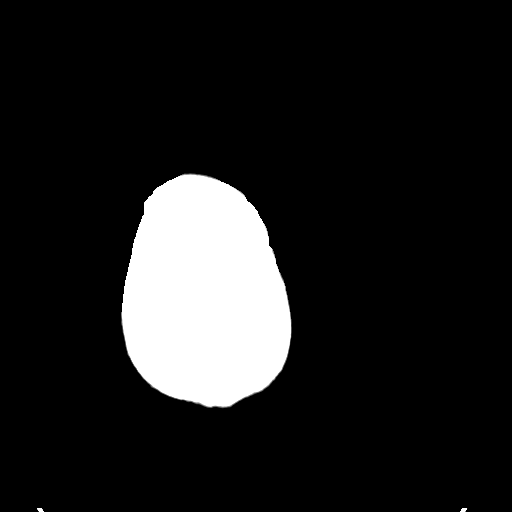

[Series 4: coronal soft tissue · coronal · 0.31mm/px · 3 of 69 slices shown]
[im 18/69  brain]
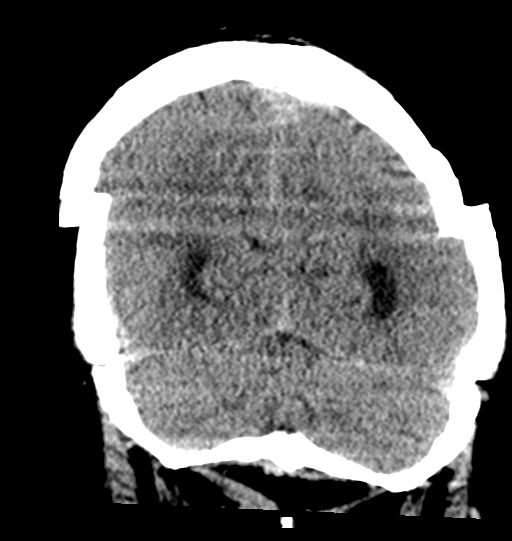
[im 35/69  brain]
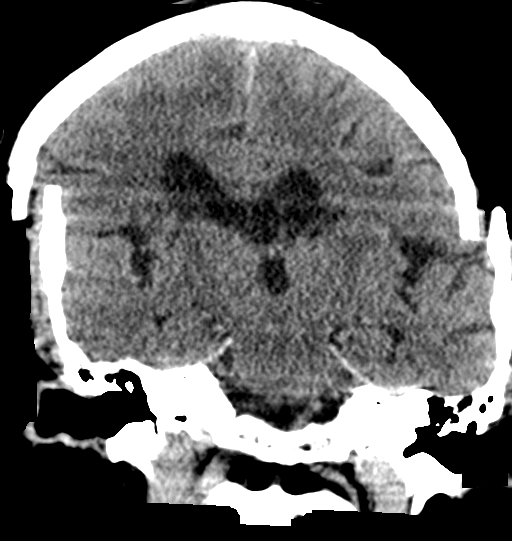
[im 52/69  brain]
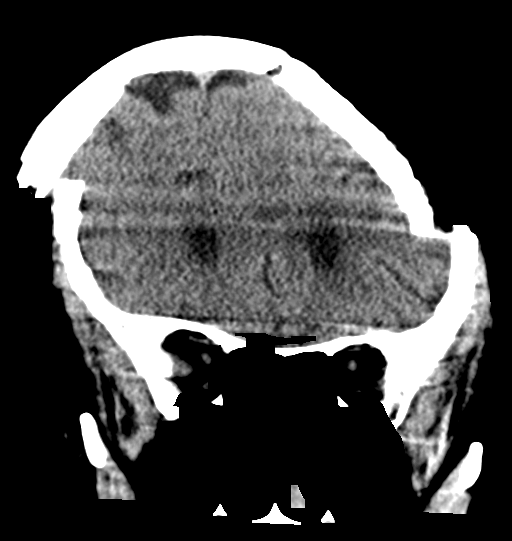

[Series 5: sagittal soft tissue · sagittal · 0.33mm/px · 2 of 56 slices shown]
[im 19/56  brain]
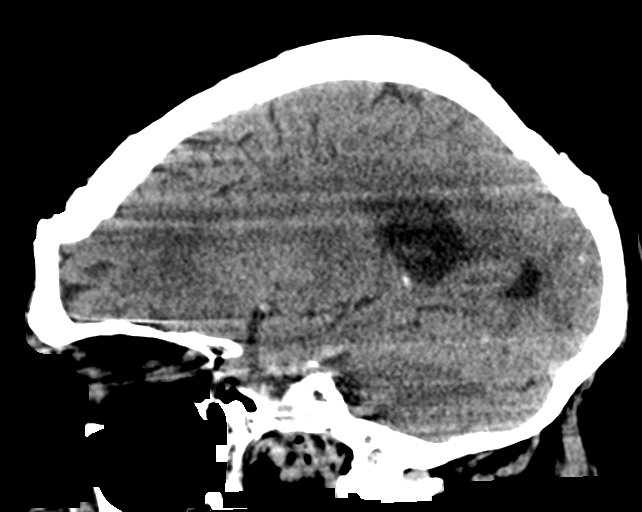
[im 37/56  brain]
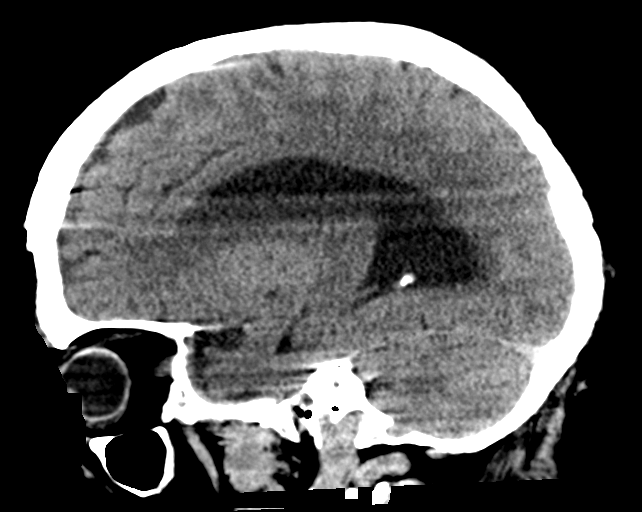

[16 of 47 positions shown; findings below may reference images not displayed]

FINDINGS: Brain: No evidence of acute infarction, hemorrhage, hydrocephalus,
extra-axial collection or mass lesion/mass effect. Central
predominant cerebral volume loss. High-density at the foramen BOZZINI
is choroid/vascular.

Vascular: No hyperdense vessel or unexpected calcification.

Skull: Negative

Sinuses/Orbits: Negative
IMPRESSION: No acute finding.

## 2018-09-01 MED ORDER — ACETAMINOPHEN 325 MG PO TABS
650.0000 mg | ORAL_TABLET | Freq: Four times a day (QID) | ORAL | Status: DC | PRN
  Administered 2018-09-01 – 2018-09-06 (×4): 650 mg via ORAL
  Filled 2018-09-01 (×5): qty 2

## 2018-09-01 MED ORDER — ONDANSETRON HCL 4 MG/2ML IJ SOLN
4.0000 mg | Freq: Four times a day (QID) | INTRAMUSCULAR | Status: DC | PRN
  Administered 2018-09-01: 4 mg via INTRAVENOUS
  Filled 2018-09-01 (×3): qty 2

## 2018-09-01 MED ORDER — ACETAMINOPHEN 650 MG RE SUPP
650.0000 mg | Freq: Four times a day (QID) | RECTAL | Status: DC | PRN
  Filled 2018-09-01: qty 1

## 2018-09-01 MED ORDER — ORAL CARE MOUTH RINSE
15.0000 mL | Freq: Two times a day (BID) | OROMUCOSAL | Status: DC
  Administered 2018-09-01 – 2018-09-05 (×9): 15 mL via OROMUCOSAL

## 2018-09-01 MED ORDER — PERFLUTREN LIPID MICROSPHERE
1.0000 mL | INTRAVENOUS | Status: AC | PRN
  Administered 2018-09-01: 4 mL via INTRAVENOUS
  Filled 2018-09-01: qty 10

## 2018-09-01 MED ORDER — POTASSIUM PHOSPHATES 15 MMOLE/5ML IV SOLN
10.0000 mmol | Freq: Once | INTRAVENOUS | Status: AC
  Administered 2018-09-01: 10 mmol via INTRAVENOUS
  Filled 2018-09-01: qty 3.33

## 2018-09-01 MED ORDER — KETOROLAC TROMETHAMINE 15 MG/ML IJ SOLN
15.0000 mg | Freq: Once | INTRAMUSCULAR | Status: AC
  Administered 2018-09-01: 15 mg via INTRAVENOUS
  Filled 2018-09-01: qty 1

## 2018-09-01 MED ORDER — SODIUM CHLORIDE 0.9 % IV BOLUS
1000.0000 mL | Freq: Once | INTRAVENOUS | Status: AC
  Administered 2018-09-01: 1000 mL via INTRAVENOUS

## 2018-09-01 MED ORDER — METRONIDAZOLE IN NACL 5-0.79 MG/ML-% IV SOLN
500.0000 mg | Freq: Once | INTRAVENOUS | Status: AC
  Administered 2018-09-01: 500 mg via INTRAVENOUS
  Filled 2018-09-01: qty 100

## 2018-09-01 MED ORDER — INSULIN ASPART 100 UNIT/ML ~~LOC~~ SOLN
0.0000 [IU] | Freq: Three times a day (TID) | SUBCUTANEOUS | Status: DC
  Administered 2018-09-01: 1 [IU] via SUBCUTANEOUS
  Administered 2018-09-01: 2 [IU] via SUBCUTANEOUS

## 2018-09-01 MED ORDER — POTASSIUM CHLORIDE 10 MEQ/100ML IV SOLN: 10.0000 meq | INTRAVENOUS | Status: DC

## 2018-09-01 MED ORDER — SODIUM CHLORIDE 0.9 % IV SOLN
500.0000 mL | Freq: Once | INTRAVENOUS | Status: AC
  Administered 2018-09-01: 500 mL via INTRAVENOUS

## 2018-09-01 MED ORDER — VANCOMYCIN HCL IN DEXTROSE 1-5 GM/200ML-% IV SOLN
1000.0000 mg | Freq: Once | INTRAVENOUS | Status: AC
  Administered 2018-09-01: 1000 mg via INTRAVENOUS
  Filled 2018-09-01: qty 200

## 2018-09-01 MED ORDER — ACETAMINOPHEN 650 MG RE SUPP
650.0000 mg | Freq: Once | RECTAL | Status: AC
  Administered 2018-09-01: 650 mg via RECTAL
  Filled 2018-09-01: qty 1

## 2018-09-01 MED ORDER — OSELTAMIVIR PHOSPHATE 75 MG PO CAPS
75.0000 mg | ORAL_CAPSULE | Freq: Two times a day (BID) | ORAL | Status: DC
  Administered 2018-09-01 – 2018-09-02 (×2): 75 mg via ORAL
  Filled 2018-09-01 (×2): qty 1

## 2018-09-01 MED ORDER — METRONIDAZOLE IN NACL 5-0.79 MG/ML-% IV SOLN
500.0000 mg | Freq: Three times a day (TID) | INTRAVENOUS | Status: DC
  Administered 2018-09-01 – 2018-09-03 (×6): 500 mg via INTRAVENOUS
  Filled 2018-09-01 (×7): qty 100

## 2018-09-01 MED ORDER — SODIUM CHLORIDE 0.9 % IV SOLN
2.0000 g | Freq: Once | INTRAVENOUS | Status: AC
  Administered 2018-09-01: 2 g via INTRAVENOUS
  Filled 2018-09-01: qty 2

## 2018-09-01 MED ORDER — MIDAZOLAM HCL 2 MG/2ML IJ SOLN
2.0000 mg | Freq: Once | INTRAMUSCULAR | Status: AC
  Administered 2018-09-01: 2 mg via INTRAVENOUS
  Filled 2018-09-01: qty 2

## 2018-09-01 MED ORDER — POTASSIUM CHLORIDE 10 MEQ/100ML IV SOLN
10.0000 meq | INTRAVENOUS | Status: AC
  Administered 2018-09-01 (×2): 10 meq via INTRAVENOUS
  Filled 2018-09-01 (×4): qty 100

## 2018-09-01 MED ORDER — POTASSIUM CHLORIDE IN NACL 20-0.9 MEQ/L-% IV SOLN
INTRAVENOUS | Status: DC
  Administered 2018-09-01 – 2018-09-02 (×3): via INTRAVENOUS
  Filled 2018-09-01 (×3): qty 1000

## 2018-09-01 MED ORDER — LACTATED RINGERS IV BOLUS
1000.0000 mL | Freq: Once | INTRAVENOUS | Status: AC
  Administered 2018-09-01: 1000 mL via INTRAVENOUS

## 2018-09-01 MED ORDER — LACTATED RINGERS IV BOLUS (SEPSIS)
2000.0000 mL | Freq: Once | INTRAVENOUS | Status: AC
  Administered 2018-09-01: 2000 mL via INTRAVENOUS

## 2018-09-01 MED ORDER — SODIUM CHLORIDE 0.9 % IV SOLN
2.0000 g | Freq: Three times a day (TID) | INTRAVENOUS | Status: DC
  Administered 2018-09-01 – 2018-09-02 (×4): 2 g via INTRAVENOUS
  Filled 2018-09-01 (×5): qty 2

## 2018-09-01 MED ORDER — ONDANSETRON HCL 4 MG PO TABS
4.0000 mg | ORAL_TABLET | Freq: Four times a day (QID) | ORAL | Status: DC | PRN
  Administered 2018-09-03: 4 mg via ORAL
  Filled 2018-09-01: qty 1

## 2018-09-01 MED ORDER — SODIUM CHLORIDE 0.9 % IV SOLN
INTRAVENOUS | Status: DC | PRN
  Administered 2018-09-01: 500 mL via INTRAVENOUS

## 2018-09-01 MED ORDER — SODIUM CHLORIDE 0.9 % IV SOLN
INTRAVENOUS | Status: DC
  Administered 2018-09-01 (×2): via INTRAVENOUS

## 2018-09-01 MED ORDER — DEXTROSE 5 % IV SOLN
950.0000 mg | Freq: Once | INTRAVENOUS | Status: AC
  Administered 2018-09-01: 950 mg via INTRAVENOUS
  Filled 2018-09-01: qty 19

## 2018-09-01 NOTE — ED Notes (Signed)
Patient transported to CT 

## 2018-09-01 NOTE — Progress Notes (Signed)
Pt became hypotensive. Started a 500 mL bolus per adult emergency standing orders and notified provider. Awaiting orders. Will continue to monitor.  Mariann Laster, RN

## 2018-09-01 NOTE — Consult Note (Signed)
[facility] for Infectious Disease  Total days of antibiotics 2               Reason for Consult: encephalopathy    Referring Physician: Olevia Bowens  Principal Problem:   Sepsis due to undetermined organism [facility]) Active Problems:   Diabetes mellitus without complication ([place])   Hyponatremia   Polycythemia   Abnormal LFTs   Hypophosphatemia   Altered mental status    HPI: Michael Hines is a 73 y.o. 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 The patient's family report that 4 nights prior to admit started to feel poorly, having malaise, fever, and confusion that progressively got worse, unable to answer questions, and not responding properly. He was admitted on evening of 1/21. In the Ed, found to have fever of 104F, HR elevated, leukocytosis of 15K with left shift.hyponatremia of 131. LA 1.4 Wife reported having fever/diaphoresis/confusion N/V/loose stools. No cough. He was started on vanco/cefepime/metronidazole/acyclovir. Blood cx and csf cx taken  Underwent LP which showed wbc 2/glu 102/protein 23. Gram stain negative for organism, few WBC UA clean Flu a/b negative  He was screened for flu - which was negative CXR clear NCHCT no infarct Poor quality TTE  He received flu shot on the day of his symptoms starting. No sick contact  Past Medical History:  Diagnosis Date  . Anxiety   . B12 deficiency   . Back pain   . Diabetes mellitus without complication ([place])   . Hyperlipidemia   . Testosterone insufficiency     Allergies: No Known Allergies  MEDICATIONS: . insulin aspart  0-9 Units Subcutaneous Q8H  . mouth rinse  15 mL Mouth Rinse BID    Social History   Tobacco Use  . Smoking status: Current Every Day Smoker    Packs/day: 1.50    Years: 55.00    Pack years: 82.50    Types: Cigarettes  .  Smokeless tobacco: Former Network engineer Use Topics  . Alcohol use: Not Currently  . Drug use: Never    Family History  Problem Relation Age of Onset  . Hypertension Mother   . Diabetes Mother     Review of Systems  Constitutional: + for fever, chills, diaphoresis, activity change, appetite change, fatigue and unexpected weight change.  HENT: Negative for congestion, sore throat, rhinorrhea, sneezing, trouble swallowing and sinus pressure.  Eyes: Negative for photophobia and visual disturbance.  Respiratory: Negative for cough, chest tightness, shortness of breath, wheezing and stridor.  Cardiovascular: Negative for chest pain, palpitations and leg swelling.  Gastrointestinal: + diarrhea. Negative for nausea, vomiting, abdominal pain,  constipation, blood in stool, abdominal distention and anal bleeding.  Genitourinary: Negative for dysuria, hematuria, flank pain and difficulty urinating.  Musculoskeletal: Negative for myalgias, back pain, joint swelling, arthralgias and gait problem.  Skin: Negative for color change, pallor, rash and wound.  Neurological: +confusion. +headache. Negative for dizziness, tremors, weakness and light-headedness.  Hematological: Negative for adenopathy. Does not bruise/bleed easily.  Psychiatric/Behavioral: Negative for behavioral problems, confusion, sleep disturbance, dysphoric mood, decreased concentration and agitation.     OBJECTIVE: Temp:  [98.9 F (37.2 C)-104.4 F (40.2 C)] 98.9 F (37.2 C) (01/22 1200) Pulse Rate:  [55-116] 69 (01/22 1600) Resp:  [16-33] 21 (01/22 1600) BP: (75-135)/(34-97) 106/54 (01/22 1600) SpO2:  [90 %-100 %] 97 % (01/22 1600) Weight:  [  94.1 kg] 94.1 kg (01/22 0800) Physical Exam  Constitutional: He is oriented to person, place, and time. He appears well-developed and well-nourished. No distress.  HENT:  Mouth/Throat: Oropharynx is clear and moist. No oropharyngeal exudate.  Cardiovascular: bradycardia, regular  rhythm and normal heart sounds. Difficult to auscultate heart sounds. Pulmonary/Chest: Effort normal and breath sounds normal. No respiratory distress. He has no wheezes.  Abdominal: Soft. Bowel sounds are quiet. He exhibits no distension. There is no tenderness.  Lymphadenopathy:  He has no cervical adenopathy.  Neurological: He is alert and oriented to person, place, and time.  Skin: Skin is warm and dry. No rash noted. No erythema.  Psychiatric: He has a normal mood and affect. His behavior is normal.     LABS: Results for orders placed or performed during the hospital encounter of 09/01/18 (from the past 48 hour(s))  Comprehensive metabolic panel     Status: Abnormal   Collection Time: 09/01/18  5:01 AM  Result Value Ref Range   Sodium 131 (L) 135 - 145 mmol/L   Potassium 3.8 3.5 - 5.1 mmol/L   Chloride 99 98 - 111 mmol/L   CO2 19 (L) 22 - 32 mmol/L   Glucose, Bld 176 (H) 70 - 99 mg/dL   BUN 25 (H) 8 - 23 mg/dL   Creatinine, Ser 0.98 0.61 - 1.24 mg/dL   Calcium 8.6 (L) 8.9 - 10.3 mg/dL   Total Protein 7.0 6.5 - 8.1 g/dL   Albumin 3.4 (L) 3.5 - 5.0 g/dL   AST 46 (H) 15 - 41 U/L   ALT 57 (H) 0 - 44 U/L   Alkaline Phosphatase 72 38 - 126 U/L   Total Bilirubin 2.0 (H) 0.3 - 1.2 mg/dL   GFR calc non Af Amer >60 >60 mL/min   GFR calc Af Amer >60 >60 mL/min   Anion gap 13 5 - 15    Comment: Performed at [facility], [place] [address], [place], [place] [address]  Lactic acid, plasma     Status: None   Collection Time: 09/01/18  5:01 AM  Result Value Ref Range   Lactic Acid, Venous 1.4 0.5 - 1.9 mmol/L    Comment: Performed at [facility], [place] [address], [place], [place] [address]  CBC with Differential     Status: Abnormal   Collection Time: 09/01/18  5:01 AM  Result Value Ref Range   WBC 15.2 (H) 4.0 - 10.5 K/uL   RBC 5.85 (H) 4.22 - 5.81 MIL/uL   Hemoglobin 17.6 (H) 13.0 - 17.0 g/dL   HCT 52.2 (H) 39.0 - 52.0 %   MCV 89.2 80.0 - 100.0  fL   MCH 30.1 26.0 - 34.0 pg   MCHC 33.7 30.0 - 36.0 g/dL   RDW 14.1 11.5 - 15.5 %   Platelets 158 150 - 400 K/uL   nRBC 0.0 0.0 - 0.2 %   Neutrophils Relative % 91 %    Comment: VACUOLATED NEUTROPHILS   Neutro Abs 14.0 (H) 1.7 - 7.7 K/uL   Lymphocytes Relative 3 %   Lymphs Abs 0.4 (L) 0.7 - 4.0 K/uL   Monocytes Relative 4 %   Monocytes Absolute 0.6 0.1 - 1.0 K/uL   Eosinophils Relative 1 %   Eosinophils Absolute 0.1 0.0 - 0.5 K/uL   Basophils Relative 0 %   Basophils Absolute 0.1 0.0 - 0.1 K/uL   Immature Granulocytes 1 %   Abs Immature Granulocytes 0.16 (H) 0.00 - 0.07 K/uL  Dohle Bodies PRESENT     Comment: Performed at [place] [address] [place], [place] [address]  Protime-INR     Status: None   Collection Time: 09/01/18  5:01 AM  Result Value Ref Range   Prothrombin Time 13.9 11.4 - 15.2 seconds   INR 1.08     Comment: Performed at [facility], [place] [address], [place], [place] [address]  Magnesium     Status: None   Collection Time: 09/01/18  5:01 AM  Result Value Ref Range   Magnesium 2.2 1.7 - 2.4 mg/dL    Comment: Performed at [facility], [place] [address], [place], [place] [address]  Phosphorus     Status: Abnormal   Collection Time: 09/01/18  5:01 AM  Result Value Ref Range   Phosphorus 2.2 (L) 2.5 - 4.6 mg/dL    Comment: Performed at [facility], [place] [address] [place], [place] [address]  Urinalysis, Routine w reflex microscopic     Status: Abnormal   Collection Time: 09/01/18  5:14 AM  Result Value Ref Range   Color, Urine YELLOW YELLOW   APPearance CLEAR CLEAR   Specific Gravity, Urine 1.025 1.005 - 1.030   pH 5.0 5.0 - 8.0   Glucose, UA NEGATIVE NEGATIVE mg/dL   Hgb urine dipstick MODERATE (A) NEGATIVE   Bilirubin Urine NEGATIVE NEGATIVE   Ketones, ur 20 (A) NEGATIVE mg/dL   Protein, ur 100 (A) NEGATIVE mg/dL   Nitrite NEGATIVE NEGATIVE   Leukocytes, UA NEGATIVE  NEGATIVE   RBC / HPF 0-5 0 - 5 RBC/hpf   WBC, UA 0-5 0 - 5 WBC/hpf   Bacteria, UA RARE (A) NONE SEEN   Mucus PRESENT     Comment: Performed at [facility], [place] [address] [place], [place] [address]  Influenza panel by PCR (type A & B)     Status: None   Collection Time: 09/01/18  5:14 AM  Result Value Ref Range   Influenza A By PCR NEGATIVE NEGATIVE   Influenza B By PCR NEGATIVE NEGATIVE    Comment: (NOTE) The Xpert Xpress Flu assay is intended as an aid in the diagnosis of  influenza and should not be used as a sole basis for treatment.  This  assay is FDA approved for nasopharyngeal swab specimens only. Nasal  washings and aspirates are unacceptable for Xpert Xpress Flu testing. Performed at [facility], [place] [address], [place], [place] [address]   POC CBG, ED     Status: Abnormal   Collection Time: 09/01/18  5:17 AM  Result Value Ref Range   Glucose-Capillary 193 (H) 70 - 99 mg/dL  CSF cell count with differential collection tube #: 1     Status: Abnormal   Collection Time: 09/01/18  6:43 AM  Result Value Ref Range   Tube # 1    Color, CSF COLORLESS COLORLESS   Appearance, CSF CLEAR (A) CLEAR   Supernatant NOT INDICATED    RBC Count, CSF 2 (H) 0 /cu mm   WBC, CSF 2 0 - 5 /cu mm   Other Cells, CSF TOO FEW TO COUNT, SMEAR AVAILABLE FOR REVIEW     Comment: RARE LYMPHOCYTES AND MONOCYTES PRESENT Performed at [place] [address] [place],  [address]   CSF cell count with differential collection tube #: 4     Status: Abnormal   Collection Time: 09/01/18  6:43 AM  Result Value  Ref Range   Tube # 4    Color, CSF COLORLESS COLORLESS   Appearance, CSF CLEAR (A) CLEAR   Supernatant NOT INDICATED    RBC Count, CSF 2 (H) 0 /cu mm   WBC, CSF 3 0 - 5 /cu mm   Other Cells, CSF TOO FEW TO COUNT, SMEAR AVAILABLE FOR REVIEW     Comment: RARE LYMPHOCYTES AND MONOCYTES PRESENT.  Performed at [facility], [place] [address], [place], [place] [address]   CSF culture     Status: None (Preliminary result)   Collection Time: 09/01/18  6:43 AM  Result Value Ref Range   Specimen Description      CSF Performed at [place] [address] [place], [place] [address]    Special Requests      NONE Performed at [facility], [place] [address], [place], [place] [address]    Gram Stain      WBC PRESENT, PREDOMINANTLY MONONUCLEAR NO ORGANISMS SEEN CYTOSPIN SMEAR Gram Stain Report Called to,Read Back By and Verified With: SMITH,T. RN @0738  ON 1.22.2020 BY [email or web address]    Culture PENDING    Report Status PENDING   Glucose, CSF     Status: Abnormal   Collection Time: 09/01/18  6:43 AM  Result Value Ref Range   Glucose, CSF 102 (H) 40 - 70 mg/dL    Comment: Performed at [facility], [place] [address], [place], [place] [address]  Protein, CSF     Status: None   Collection Time: 09/01/18  6:43 AM  Result Value Ref Range   Total  Protein, CSF 23 15 - 45 mg/dL    Comment: Performed at [facility], [place] [address], [place], [place] [address]  MRSA PCR Screening     Status: None   Collection Time: 09/01/18  7:55 AM  Result Value Ref Range   MRSA by PCR NEGATIVE NEGATIVE    Comment:        The GeneXpert MRSA Assay (FDA approved for NASAL specimens only), is one component of a comprehensive MRSA colonization surveillance program. It is not intended to diagnose MRSA infection nor to guide or monitor treatment for MRSA infections. Performed at [facility], [place] [address] [place], [place] [address]   Glucose, capillary     Status: Abnormal   Collection Time: 09/01/18  8:37 AM  Result Value Ref Range   Glucose-Capillary 184 (H) 70 - 99 mg/dL  Troponin I - Now Then Q6H     Status: Abnormal   Collection Time: 09/01/18 11:29 AM  Result Value Ref Range   Troponin I 0.03 (HH) <0.03 ng/mL    Comment:  CRITICAL RESULT CALLED TO, READ BACK BY AND VERIFIED WITH: SUGGS,Z. RN AT 1231 09/01/18 MULLINS,T Performed at [facility], [place] [address], [place], [place] [address]   Basic metabolic panel     Status: Abnormal   Collection Time: 09/01/18 11:29 AM  Result Value Ref Range   Sodium 136 135 - 145 mmol/L   Potassium 3.2 (L) 3.5 - 5.1 mmol/L   Chloride 106 98 - 111 mmol/L   CO2 23 22 - 32 mmol/L   Glucose, Bld 152 (H) 70 - 99 mg/dL   BUN 22 8 - 23 mg/dL   Creatinine, Ser 0.98 0.61 - 1.24 mg/dL   Calcium 7.2 (L) 8.9 - 10.3 mg/dL   GFR calc non Af Amer >60 >60 mL/min   GFR calc  Af Amer >60 >60 mL/min   Anion gap 7 5 - 15    Comment: Performed at [facility], [place] [address] [place], [place] [address]  Glucose, capillary     Status: Abnormal   Collection Time: 09/01/18  2:17 PM  Result Value Ref Range   Glucose-Capillary 125 (H) 70 - 99 mg/dL    MICRO:  IMAGING: Dg Chest 2 View  Result Date: 09/01/2018 CLINICAL DATA:  Suspected sepsis EXAM: CHEST - 2 VIEW COMPARISON:  06/25/2015 FINDINGS: Normal heart size. Aortic tortuosity. Prior median sternotomy for aortic valve replacement. Interstitial coarsening similar to prior. There is no edema, consolidation, effusion, or pneumothorax. Artifact from EKG leads. IMPRESSION: No acute finding when compared to prior. Electronically Signed   By: Monte Fantasia M.D.   On: 09/01/2018 05:46   Ct Head Wo Contrast  Result Date: 09/01/2018 CLINICAL DATA:  Altered level of consciousness EXAM: CT HEAD WITHOUT CONTRAST TECHNIQUE: Contiguous axial images were obtained from the base of the skull through the vertex without intravenous contrast. COMPARISON:  None. FINDINGS: Brain: No evidence of acute infarction, hemorrhage, hydrocephalus, extra-axial collection or mass lesion/mass effect. Central predominant cerebral volume loss. 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 Vascular: No hyperdense vessel or  unexpected calcification. Skull: Negative Sinuses/Orbits: Negative IMPRESSION: No acute finding. Electronically Signed   By: [place] Fantasia M.D.   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 He had quick onset of symptoms that can often be seen with staphylococcal/toxic shock syndrome. occ flu has AMS and GI symptoms as part of presentation -  Would still give tamiflu - continue on broad spectrum abtx- vanco,cefepime ,and metronidazole - have d/c'd acyclovir. LP studies not suggestive of infectious process If he develops bacteremia - will need TEE  Diarrhea = has not had recent abtx or recent hospitalization to place him at risk for cdifficile. If he does not have further diarrrheal stools in next 12 hrs, would d/c order.  Leukocytosis = with left shift. Will continue to monitor. Anticipate to improve with antimicrobials

## 2018-09-01 NOTE — Progress Notes (Signed)
  Echocardiogram 2D Echocardiogram has been performed with Definity.  Michael Hines 09/01/2018, 11:02 AM

## 2018-09-01 NOTE — Progress Notes (Signed)
A consult was received from an ED physician for cefepime and vancomycin per pharmacy dosing.  The patient's profile has been reviewed for ht/wt/allergies/indication/available labs.   A one time order has been placed for Cefepime 2 Gm and Vancomycin 1 Gm.  Further antibiotics/pharmacy consults should be ordered by admitting physician if indicated.                       Thank you, Dorrene German 09/01/2018  5:34 AM

## 2018-09-01 NOTE — H&P (Signed)
History and Physical    Michael Hines FIE:[id number] DOB: 03-06-1946 DOA: 09/01/2018  PCP: Finis Bud, MD   Patient coming from: Home.  I have personally briefly reviewed patient's old medical records in [place]  Chief Complaint: Fever and AMS.  HPI: Michael Hines is a 73 y.o. male with medical history significant of anxiety, B12 deficiency, back pain, type 2 diabetes, hyperlipidemia, testosterone insufficiency, aortic insufficiency with a history of aortic valve replacement who is coming to the emergency department with altered mental status and fever 3 days after receiving the influenza vaccine.  Per patient's wife, the patient started having headache associated with nausea, several episodes of emesis, multiple daily episodes of diarrhea, myalgia and arthralgias 2 days ago.  There is no travel history or sick contacts to the wife's knowledge.  She or any other relatives in contact with him does not have similar symptoms.  The patient's wife states that the patient has a previous history of adverse reactions to the influenza vaccine, but never has had symptoms as severe as these.  Last night, the patient seemed to be getting confused, but went to sleep uneventfully.  He woke up this morning febrile and unable to state his name.  Currently, he is oriented to name, but is disoriented to time, date and situation.  ED Course: Initial vital signs temperature 104.4 F, pulse 116, respirations 30, blood pressure 125/96 and O2 sat 90% on room air.  The patient was given supplemental oxygen, LR 2000 mL bolus, 650 mg of acetaminophen PR, also cefepime, metronidazole and vancomycin IVPB.  His urinalysis shows moderate hemoglobinuria, ketones of 20 and protein 100 mg/dL.  Microscopic examination shows rare bacteria.  CBC shows a white count was 15.2 with 91% neutrophils, 3% lymphocytes and 4% monocytes.  Hemoglobin was 17.6 g/dL and platelets 158.  PT and INR are within normal  limits.  Influenza a and B by PCR is negative.  CMP shows a sodium 138 potassium 3.8, chloride 99 and CO2 19 mmol/L.  Bilirubin of 2.0, BUN 25, creatinine 0.98, glucose 176, calcium 8.6, magnesium 2.2 and phosphorus 2.2 mg/dL.  Total protein was 7.0, albumin 3.4 g/dL.  AST was 46 and ALT 57 units/L.  Alkaline phosphatase was normal.  Imaging: CT head without contrast did not show any acute finding.  A 2 view chest radiograph did not show any acute cardiopulmonary pathology.  Please see images and full radiology report for further detail.  LP/CSF: Preliminary CSF results shows a total protein of 23 and glucose of 102 mg/dL.  Serum glucose was 176 mg/dL an hour and a half prior to LP.  Also shows colorless and clear CSF.  To 1 has to RBC and 2 WBCs.  Two 4/2 RBCs and 3 WBCs.  CSF cultures are pending.  Review of Systems: Unable to obtain.  Past Medical History:  Diagnosis Date  . Anxiety   . B12 deficiency   . Back pain   . Diabetes mellitus without complication ([place])   . Hyperlipidemia   . Testosterone insufficiency     Past Surgical History:  Procedure Laterality Date  . AORTIC VALVE REPLACEMENT    . CHOLECYSTECTOMY    . STRABISMUS SURGERY       reports that he has been smoking cigarettes. He has a 82.50 pack-year smoking history. He has quit using smokeless tobacco. He reports previous alcohol use. He reports that he does not use drugs.  No Known Allergies  Family History  Problem Relation  Age of Onset  . Hypertension Mother   . Diabetes Mother     Prior to Admission medications   Not on File Pending pharmacist review    Physical Exam: Vitals:   09/01/18 0644 09/01/18 0652 09/01/18 0700 09/01/18 0800  BP:  116/75 103/69 (!) 92/55  Pulse:  (!) 101 95 78  Resp:  (!) 30 (!) 30 (!) 24  Temp: (!) 103 F (39.4 C)   (!) 101.1 F (38.4 C)  TempSrc: Rectal   Rectal  SpO2:  91% 91% 92%  Weight:    94.1 kg    Constitutional: Mildly febrile, looks acutely ill. Eyes: PERRL,  lids and conjunctivae normal ENMT: Mucous membranes are very dry.  Lips show peeled skin.  Posterior pharynx clear of any exudate or lesions on limited assessment. Neck: normal, supple, no masses, no thyromegaly Respiratory: Mild rhonchi bilaterally, no wheezing, no crackles. Normal respiratory effort. No accessory muscle use.  Cardiovascular: Regular rate and rhythm, no murmurs / rubs / gallops. No extremity edema. 2+ pedal pulses. No carotid bruits.  Abdomen: Soft, no tenderness, no masses palpated. No hepatosplenomegaly. Bowel sounds positive.  Musculoskeletal: no clubbing / cyanosis.  Good ROM, no contractures. Normal muscle tone.  Skin: no rashes, lesions, ulcers. No induration on limited dermatological examination. Neurologic: Moves all extremities.  Does not follow commands.  Unable to fully evaluate.Marland Kitchen  Psychiatric: Somnolent.  Answers simple questions.  Oriented to name only.  Labs on Admission: I have personally reviewed following labs and imaging studies  CBC: Recent Labs  Lab 09/01/18 0501  WBC 15.2*  NEUTROABS 14.0*  HGB 17.6*  HCT 52.2*  MCV 89.2  PLT 852   Basic Metabolic Panel: Recent Labs  Lab 09/01/18 0501  NA 131*  K 3.8  CL 99  CO2 19*  GLUCOSE 176*  BUN 25*  CREATININE 0.98  CALCIUM 8.6*  MG 2.2  PHOS 2.2*   GFR: CrCl cannot be calculated (Unknown ideal weight.). Liver Function Tests: Recent Labs  Lab 09/01/18 0501  AST 46*  ALT 57*  ALKPHOS 72  BILITOT 2.0*  PROT 7.0  ALBUMIN 3.4*   No results for input(s): LIPASE, AMYLASE in the last 168 hours. No results for input(s): AMMONIA in the last 168 hours. Coagulation Profile: Recent Labs  Lab 09/01/18 0501  INR 1.08   Cardiac Enzymes: No results for input(s): CKTOTAL, CKMB, CKMBINDEX, TROPONINI in the last 168 hours. BNP (last 3 results) No results for input(s): PROBNP in the last 8760 hours. HbA1C: No results for input(s): HGBA1C in the last 72 hours. CBG: Recent Labs  Lab  09/01/18 0517 09/01/18 0837  GLUCAP 193* 184*   Lipid Profile: No results for input(s): CHOL, HDL, LDLCALC, TRIG, CHOLHDL, LDLDIRECT in the last 72 hours. Thyroid Function Tests: No results for input(s): TSH, T4TOTAL, FREET4, T3FREE, THYROIDAB in the last 72 hours. Anemia Panel: No results for input(s): VITAMINB12, FOLATE, FERRITIN, TIBC, IRON, RETICCTPCT in the last 72 hours. Urine analysis:    Component Value Date/Time   COLORURINE YELLOW 09/01/2018 [place] 09/01/2018 0514   LABSPEC 1.025 09/01/2018 0514   PHURINE 5.0 09/01/2018 0514   GLUCOSEU NEGATIVE 09/01/2018 0514   HGBUR MODERATE (A) 09/01/2018 0514   BILIRUBINUR NEGATIVE 09/01/2018 0514   KETONESUR 20 (A) 09/01/2018 0514   PROTEINUR 100 (A) 09/01/2018 0514   NITRITE NEGATIVE 09/01/2018 0514   LEUKOCYTESUR NEGATIVE 09/01/2018 0514    Radiological Exams on Admission: Dg Chest 2 View  Result Date: 09/01/2018 CLINICAL  DATA:  Suspected sepsis EXAM: CHEST - 2 VIEW COMPARISON:  06/25/2015 FINDINGS: Normal heart size. Aortic tortuosity. Prior median sternotomy for aortic valve replacement. Interstitial coarsening similar to prior. There is no edema, consolidation, effusion, or pneumothorax. Artifact from EKG leads. IMPRESSION: No acute finding when compared to prior. Electronically Signed   By: Monte Fantasia M.D.   On: 09/01/2018 05:46   Ct Head Wo Contrast  Result Date: 09/01/2018 CLINICAL DATA:  Altered level of consciousness EXAM: CT HEAD WITHOUT CONTRAST TECHNIQUE: Contiguous axial images were obtained from the base of the skull through the vertex without intravenous contrast. COMPARISON:  None. FINDINGS: Brain: No evidence of acute infarction, hemorrhage, hydrocephalus, extra-axial collection or mass lesion/mass effect. Central predominant cerebral volume loss. 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 Vascular: No hyperdense vessel or unexpected calcification. Skull: Negative Sinuses/Orbits:  Negative IMPRESSION: No acute finding. Electronically Signed   By: Monte Fantasia M.D.   On: 09/01/2018 05:40    EKG: Independently reviewed.  Vent. rate 119 BPM PR interval * ms QRS duration 89 ms QT/QTc 314/442 ms P-R-T axes 31 -36 99 Sinus tachycardia Left axis deviation Probable anteroseptal infarct, old Nonspecific T abnormalities, lateral leads No acute changes No old tracing to compare  Assessment/Plan Principal Problem:   Sepsis due to undetermined organism ([place]) Admit to stepdown/inpatient. Keep n.p.o. Continue IV fluids. Continue cefepime per pharmacy. Continue Flagyl 500 mg IVPB every 8 hours. Continue vancomycin per pharmacy. Follow-up blood culture and sensitivity. Follow-up CSF C&S. ID consult requested.  Active Problems:   Altered mental status Keep n.p.o. until more alert. Supportive care. Continue IV fluids. Continue IV antibiotics.    Diabetes mellitus without complication (HCC) Currently n.p.o. Continue IV fluids. CBG monitoring every 8 hours with sensitive RI SS.    Hyponatremia Likely due to GI losses and decreased oral intake. Continue normal saline infusion. Follow-up sodium level.    Polycythemia Secondary to dehydration induced hemoconcentration.    Abnormal LFTs Likely due to fever and hemoconcentration. Continue IV fluids. Check acute hepatitis panel. Follow-up liver function tests.    Hypophosphatemia Replacing. Follow-up level as needed.     DVT prophylaxis: SCDs. Code Status: Full code. Family Communication: His wife and son were present in the room. Disposition Plan: Admit for IV antibiotic therapy and further work-up. Consults called: Infectious diseases Karolee Ohs, MD). Admission status: Inpatient/stepdown.   [name] MD [facility]  09/01/2018, 9:42 AM

## 2018-09-01 NOTE — Care Management (Addendum)
This is a no charge note  Pending admission per Dr. Kathrynn Humble  73 year old man with past medical history of aortic valve replacement with bioprosthesis valve, hyperlipidemia, GERD, depression, who presents with altered mental status and fever.  Pt had Flu shot 3 days ago. Patient was getting sick the same night. Pt becomes confused today. Has fever with Temperature 104.4 in ED. patient was found to have leukocytosis with WBC 15.7, negative urinalysis, negative flu PCR, lactic acid 1.4, INR 1.08, renal function okay, tachycardia, tachypnea.  Patient also has oxygen desaturation to 88% on room air, chest x-ray negative.  CT head is negative for acute intracranial abnormalities.  Has abnormal liver function.  Pt is septic. Source of infection is not clear.  Vancomycin and cefepime were started in the ED.  ED physician is going to do LP. Pt is admitted to SDU as inpt. I discussed with Dr. Kathrynn Humble.  If LP is not successful.  He will start ampicillin, acyclovir and Decadron empirically for possible meningitis.   Ivor Costa, MD  [facility] Pager [phone]  If 7PM-7AM, please contact night-coverage [email or web address] Password [facility] 09/01/2018, 6:24 AM

## 2018-09-01 NOTE — Progress Notes (Signed)
CRITICAL VALUE ALERT  Critical Value:  Troponin 0.03  Date & Time Notied:  12:31 PM 09/01/2018  Provider Notified: Olevia Bowens, MD  Orders Received/Actions taken: trending troponin

## 2018-09-01 NOTE — Progress Notes (Signed)
Patient ID: Michael Hines, male   DOB: 04/25/1946, 73 y.o.   MRN: [id number]   The patient was reevaluated due to hypotension and a mid 70s to mid 90s despite several IV boluses.  He seems to be less confused now as he knows his name, and now knows he is at [facility] and is aware that he has been sick recently.  However, he was unable to provide details on his symptoms and was still disoriented to time/date.  His tachycardia and EKG changes have resolved with IV hydration.  He has not voided yet.  He complains of having had dry mouth and thirst.  He would like to try some ice chips.    Plan: Will continue current treatment for now.  Check bladder scan. May try ice chips. Continue IV hydration. Continue electrolyte replacement. Trend troponin level.   Echocardiogram pending.  Tennis Must, MD.

## 2018-09-01 NOTE — Progress Notes (Signed)
MD made aware of patient's continuous hypotension despite fluid boluses. Awaiting further orders, will continue to monitor.

## 2018-09-01 NOTE — ED Notes (Signed)
Pt transported to CT ?

## 2018-09-01 NOTE — Progress Notes (Signed)
Pharmacy Antibiotic Note  Michael Hines is a 73 y.o. male admitted on 09/01/2018 with fever, AMS, sepsis, unknown source, possible meningitis.  Pharmacy has been consulted for acyclovir, vancomycin, and cefepime dosing.  CSF cxt obtained, HSV PCR in process SCr ~ 1  Tm 104.4 WBC 15.2  Plan: Metronidazole 500mg  IV q8h per MD Cefepime 2g IV q8h Vancomycin 1g IV x1, followed by 750 mg IV q12h. For r/o meningitis, Goal VT 15-20 with AUC <700 Expected VT 15.5, expected AUC: 518 using SCr 1 SCr daily (Ketorolac 15 mg IV x1 on 1/22) Follow up renal fxn, culture results, and clinical course.  Acyclovir 950 mg IV x1 dose.  Due to [employer] of Acyclovir, ID approval will be required for further doses. - If approved, further doses will be 10 mg /kg (using IBW, ~71 kg) 710 mg IV q8h if indicated by ID consult. - If CSF HSV PCR returns neg - recommend to stop acyclovir.    Height: 5\' 9"  (175.3 cm) Weight: 207 lb 7.3 oz (94.1 kg) IBW/kg (Calculated) : 70.7  TBW is 133% of IBW; BMI 30.64  Temp (24hrs), Avg:103.7 F (39.8 C), Min:103 F (39.4 C), Max:104.4 F (40.2 C)  Recent Labs  Lab 09/01/18 0501  WBC 15.2*  CREATININE 0.98  LATICACIDVEN 1.4    CrCl cannot be calculated (Unknown ideal weight.).    No Known Allergies  Antimicrobials this admission: 1/22 Acyclovir >>  1/22 Vancomycin >> 1/22 Cefepime >> 1/22 Metronidazole >>  Dose adjustments this admission:   Microbiology results: 1/22 BCx:  1/22 UCx:   1/22 MRSA PCR: negative 1/22 CSF cxt: no org on gram stain; cxt pending 1/22 CSF, HSV PCR:  In process 1/22 GI Panel:  1/22 Influenza a/b:  Neg/neg 1/22 HIV Ab:    Thank you for allowing pharmacy to be a part of this patient's care.  Gretta Arab PharmD, BCPS Pager [phone] 09/01/2018 8:05 AM

## 2018-09-01 NOTE — ED Notes (Signed)
ED TO INPATIENT HANDOFF REPORT  Name/Age/Gender Michael Hines 73 y.o. male  Code Status   Home/SNF/Other Home  Chief Complaint ALTERED MENTAL STATUS   Level of Care/Admitting Diagnosis ED Disposition    ED Disposition Condition [facility] Area: [place] [[id number]]  Level of Care: Stepdown [14]  Admit to SDU based on following criteria: Hemodynamic compromise or significant risk of instability:  Patient requiring short term acute titration and management of vasoactive drips, and invasive monitoring (i.e., CVP and Arterial line).  Diagnosis: Sepsis [facility]) [[id number]]  Admitting Physician: Ivor Costa [[id number]]  Attending Physician: Ivor Costa [[id number]]  Estimated length of stay: past midnight tomorrow  Certification:: I certify this patient will need inpatient services for at least 2 midnights  PT Class (Do Not Modify): Inpatient [101]  PT Acc Code (Do Not Modify): Private [1]       Medical History Past Medical History:  Diagnosis Date  . Diabetes mellitus without complication ([place])     Allergies No Known Allergies  IV Location/Drains/Wounds Patient Lines/Drains/Airways Status   Active Line/Drains/Airways    Name:   Placement date:   Placement time:   Site:   Days:   Peripheral IV 09/01/18 Right Forearm   09/01/18    0528    Forearm   less than 1          Labs/Imaging Results for orders placed or performed during the hospital encounter of 09/01/18 (from the past 48 hour(s))  Comprehensive metabolic panel     Status: Abnormal   Collection Time: 09/01/18  5:01 AM  Result Value Ref Range   Sodium 131 (L) 135 - 145 mmol/L   Potassium 3.8 3.5 - 5.1 mmol/L   Chloride 99 98 - 111 mmol/L   CO2 19 (L) 22 - 32 mmol/L   Glucose, Bld 176 (H) 70 - 99 mg/dL   BUN 25 (H) 8 - 23 mg/dL   Creatinine, Ser 0.98 0.61 - 1.24 mg/dL   Calcium 8.6 (L) 8.9 - 10.3 mg/dL   Total Protein 7.0 6.5 - 8.1 g/dL   Albumin 3.4 (L) 3.5 - 5.0 g/dL   AST 46 (H)  15 - 41 U/L   ALT 57 (H) 0 - 44 U/L   Alkaline Phosphatase 72 38 - 126 U/L   Total Bilirubin 2.0 (H) 0.3 - 1.2 mg/dL   GFR calc non Af Amer >60 >60 mL/min   GFR calc Af Amer >60 >60 mL/min   Anion gap 13 5 - 15    Comment: Performed at [facility], [place] [address] [place], [place] [address]  Lactic acid, plasma     Status: None   Collection Time: 09/01/18  5:01 AM  Result Value Ref Range   Lactic Acid, Venous 1.4 0.5 - 1.9 mmol/L    Comment: Performed at [facility], [place] [address] [place], [place] [address]  CBC with Differential     Status: Abnormal   Collection Time: 09/01/18  5:01 AM  Result Value Ref Range   WBC 15.2 (H) 4.0 - 10.5 K/uL   RBC 5.85 (H) 4.22 - 5.81 MIL/uL   Hemoglobin 17.6 (H) 13.0 - 17.0 g/dL   HCT 52.2 (H) 39.0 - 52.0 %   MCV 89.2 80.0 - 100.0 fL   MCH 30.1 26.0 - 34.0 pg   MCHC 33.7 30.0 - 36.0 g/dL   RDW 14.1 11.5 - 15.5 %   Platelets 158 150 - 400 K/uL  nRBC 0.0 0.0 - 0.2 %   Neutrophils Relative % 91 %    Comment: VACUOLATED NEUTROPHILS   Neutro Abs 14.0 (H) 1.7 - 7.7 K/uL   Lymphocytes Relative 3 %   Lymphs Abs 0.4 (L) 0.7 - 4.0 K/uL   Monocytes Relative 4 %   Monocytes Absolute 0.6 0.1 - 1.0 K/uL   Eosinophils Relative 1 %   Eosinophils Absolute 0.1 0.0 - 0.5 K/uL   Basophils Relative 0 %   Basophils Absolute 0.1 0.0 - 0.1 K/uL   Immature Granulocytes 1 %   Abs Immature Granulocytes 0.16 (H) 0.00 - 0.07 K/uL   Dohle Bodies PRESENT     Comment: Performed at [facility], [place] [address], [place], [place] [address]  Protime-INR     Status: None   Collection Time: 09/01/18  5:01 AM  Result Value Ref Range   Prothrombin Time 13.9 11.4 - 15.2 seconds   INR 1.08     Comment: Performed at [facility], [place] [address], [place], [place] [address]  Urinalysis, Routine w reflex microscopic     Status: Abnormal   Collection Time: 09/01/18  5:14 AM  Result Value Ref Range    Color, Urine YELLOW YELLOW   APPearance CLEAR CLEAR   Specific Gravity, Urine 1.025 1.005 - 1.030   pH 5.0 5.0 - 8.0   Glucose, UA NEGATIVE NEGATIVE mg/dL   Hgb urine dipstick MODERATE (A) NEGATIVE   Bilirubin Urine NEGATIVE NEGATIVE   Ketones, ur 20 (A) NEGATIVE mg/dL   Protein, ur 100 (A) NEGATIVE mg/dL   Nitrite NEGATIVE NEGATIVE   Leukocytes, UA NEGATIVE NEGATIVE   RBC / HPF 0-5 0 - 5 RBC/hpf   WBC, UA 0-5 0 - 5 WBC/hpf   Bacteria, UA RARE (A) NONE SEEN   Mucus PRESENT     Comment: Performed at [facility], [place] [address] [place], [place] [address]  Influenza panel by PCR (type A & B)     Status: None   Collection Time: 09/01/18  5:14 AM  Result Value Ref Range   Influenza A By PCR NEGATIVE NEGATIVE   Influenza B By PCR NEGATIVE NEGATIVE    Comment: (NOTE) The Xpert Xpress Flu assay is intended as an aid in the diagnosis of  influenza and should not be used as a sole basis for treatment.  This  assay is FDA approved for nasopharyngeal swab specimens only. Nasal  washings and aspirates are unacceptable for Xpert Xpress Flu testing. Performed at [facility], [place] [address] [place], [place] [address]   POC CBG, ED     Status: Abnormal   Collection Time: 09/01/18  5:17 AM  Result Value Ref Range   Glucose-Capillary 193 (H) 70 - 99 mg/dL   Dg Chest 2 View  Result Date: 09/01/2018 CLINICAL DATA:  Suspected sepsis EXAM: CHEST - 2 VIEW COMPARISON:  06/25/2015 FINDINGS: Normal heart size. Aortic tortuosity. Prior median sternotomy for aortic valve replacement. Interstitial coarsening similar to prior. There is no edema, consolidation, effusion, or pneumothorax. Artifact from EKG leads. IMPRESSION: No acute finding when compared to prior. Electronically Signed   By: Monte Fantasia M.D.   On: 09/01/2018 05:46   Ct Head Wo Contrast  Result Date: 09/01/2018 CLINICAL DATA:  Altered level of consciousness EXAM: CT HEAD WITHOUT CONTRAST  TECHNIQUE: Contiguous axial images were obtained from the base of the skull through the vertex without intravenous contrast. COMPARISON:  None. FINDINGS: Brain: No  evidence of acute infarction, hemorrhage, hydrocephalus, extra-axial collection or mass lesion/mass effect. Central predominant cerebral volume loss. 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 Vascular: No hyperdense vessel or unexpected calcification. Skull: Negative Sinuses/Orbits: Negative IMPRESSION: No acute finding. Electronically Signed   By: Monte Fantasia M.D.   On: 09/01/2018 05:40    Pending Labs Unresulted Labs (From admission, onward)    Start     Ordered   09/01/18 9562  Gastrointestinal Panel by PCR , Stool  (Gastrointestinal Panel by PCR, Stool)  Once,   R     09/01/18 0641   09/01/18 0604  CSF cell count with differential collection tube #: 1  [facility] ED ADULT CSF PANEL)  ONCE - STAT,   STAT    Question:  collection tube #  Answer:  1   09/01/18 0603   09/01/18 0604  CSF cell count with differential collection tube #: 4  [facility] ED ADULT CSF PANEL)  ONCE - STAT,   STAT    Question:  collection tube #  Answer:  4   09/01/18 0603   09/01/18 0604  CSF culture  [facility] ED ADULT CSF PANEL)  ONCE - STAT,   STAT    Question Answer Comment  Are there also cytology or pathology orders on this specimen? No   Patient immune status Normal      09/01/18 0603   09/01/18 0604  Gram stain  [facility] ED ADULT CSF PANEL)  ONCE - STAT,   STAT    Question:  Patient immune status  Answer:  Normal   09/01/18 0603   09/01/18 0604  Glucose, CSF  [facility] ED ADULT CSF PANEL)  ONCE - STAT,   STAT     09/01/18 0603   09/01/18 0604  Protein, CSF  [facility] ED ADULT CSF PANEL)  ONCE - STAT,   STAT     09/01/18 0603   09/01/18 0604  Herpes simplex virus (HSV), DNA by PCR Cerebrospinal Fluid  [facility] ED ADULT CSF PANEL)  ONCE - STAT,   STAT     09/01/18 0603   09/01/18 0507  Urine culture  ONCE - STAT,   STAT     09/01/18 0506   09/01/18 0501   Culture, blood (Routine x 2)  BLOOD CULTURE X 2,   STAT     09/01/18 0500          Vitals/Pain Today's Vitals   09/01/18 0455 09/01/18 0507 09/01/18 0546 09/01/18 0644  BP:   135/87   Pulse:  (!) 116 (!) 116   Resp:  (!) 33 (!) 25   Temp:    (!) 103 F (39.4 C)  TempSrc:    Rectal  SpO2: 94% 96% 93%     Isolation Precautions Enteric precautions (UV disinfection)  Medications Medications  lactated ringers bolus 2,000 mL (2,000 mLs Intravenous New Bag/Given 09/01/18 0644)  0.9 %  sodium chloride infusion ( Intravenous New Bag/Given 09/01/18 0648)  metroNIDAZOLE (FLAGYL) IVPB 500 mg (has no administration in time range)  acetaminophen (TYLENOL) suppository 650 mg (650 mg Rectal Given 09/01/18 0517)  ceFEPIme (MAXIPIME) 2 g in sodium chloride 0.9 % 100 mL IVPB (0 g Intravenous Stopped 09/01/18 0616)  vancomycin (VANCOCIN) IVPB 1000 mg/200 mL premix (0 mg Intravenous Stopped 09/01/18 0643)  midazolam (VERSED) injection 2 mg (2 mg Intravenous Given 09/01/18 0615)    Mobility non-ambulatory

## 2018-09-01 NOTE — ED Notes (Signed)
Patient placed on 2L O2 

## 2018-09-01 NOTE — ED Notes (Signed)
Bed: JR93 Expected date:  Expected time:  Means of arrival:  Comments: sepsis

## 2018-09-01 NOTE — ED Provider Notes (Addendum)
[facility] DEPT Provider Note   CSN: [id number] Arrival date & time: 09/01/18  0451     History   Chief Complaint Chief Complaint  Patient presents with  . Altered Mental Status    HPI JERMANY RIMEL is a 73 y.o. male.  HPI  Level 5 caveat for altered mental status.  73 year old male with history of diabetes, hyperlipidemia, aortic valve replacement comes in with chief complaint of altered mental status   According to the patient's wife, patient received a flu shot 3 days ago.  In the past when he has received a flu shot he has gotten sick with the flu, and they started noticing that patient was getting sick the same night.  As the weekend progressed, patient has progressively gotten weaker and started spiking a fever.  He has also been noted to be confused on occasion.  Last night when patient went to bed he was behaving appropriately, however when patient's wife woke up in the middle the night she noted that the patient was awake and he had a blank stare.  She also noted that he was confused and not responding appropriately therefore she called EMS.  Patient does not have any new cough.  The wife has noted episodes of emesis and loose bowel movement.  There have been no sick contacts.  Patient is diabetic and has been getting his medications for the most part.  She denies any recent trauma and there is no history of substance abuse or heavy alcohol use.  Review of system is positive for chills and diaphoresis.  Past Medical History:  Diagnosis Date  . Anxiety   . B12 deficiency   . Back pain   . Diabetes mellitus without complication ([place])   . Hyperlipidemia   . Testosterone insufficiency     Patient Active Problem List   Diagnosis Date Noted  . Sepsis due to undetermined organism ([place]) 09/01/2018  . Diabetes mellitus without complication ([place]) 56/21/3086  . Hyponatremia 09/01/2018  . Polycythemia 09/01/2018  . Abnormal LFTs 09/01/2018    . Hypophosphatemia 09/01/2018  . Altered mental status 09/01/2018    Past Surgical History:  Procedure Laterality Date  . AORTIC VALVE REPLACEMENT    . CHOLECYSTECTOMY    . STRABISMUS SURGERY          Home Medications    Prior to Admission medications   Medication Sig Start Date End Date Taking? Authorizing Provider  atorvastatin (LIPITOR) 20 MG tablet Take 20 mg by mouth at bedtime. 06/30/18  Yes [provider]  buPROPion (WELLBUTRIN XL) 300 MG 24 hr tablet Take 300 mg by mouth daily. 07/26/18  Yes [provider]  citalopram (CELEXA) 40 MG tablet Take 40 mg by mouth daily. 10/06/17  Yes [provider]  cyanocobalamin (,VITAMIN B-12,) 1000 MCG/ML injection Inject 1,000 mcg into the muscle See admin instructions. Inject 26mL intramuscularly weekly for four weeks. Then 28mL intramuscularly monthly thereafter. 08/17/18  Yes [provider]  gabapentin (NEURONTIN) 300 MG capsule Take 300-600 mg by mouth See admin instructions. Take 300mg  by mouth each morning and 600mg  each evening 07/21/18  Yes [provider]  glipiZIDE (GLUCOTROL) 10 MG tablet Take 10 mg by mouth daily. 08/17/18  Yes [provider]  HYDROcodone-acetaminophen (NORCO) 10-325 MG tablet Take 1 tablet by mouth every 6 (six) hours. 08/25/18  Yes [provider]  testosterone cypionate (DEPOTESTOSTERONE CYPIONATE) 200 MG/ML injection Inject 1 mL into the muscle every 14 (fourteen) days. 08/17/18  Yes [provider]    Family History Family History  Problem Relation Age of Onset  . Hypertension Mother   . Diabetes Mother     Social History Social History   Tobacco Use  . Smoking status: Current Every Day Smoker    Packs/day: 1.50    Years: 55.00    Pack years: 82.50    Types: Cigarettes  . Smokeless tobacco: Former Network engineer Use Topics  . Alcohol use: Not Currently  . Drug use: Never     Allergies   Patient has no known  allergies.   Review of Systems Review of Systems  Unable to perform ROS: Mental status change     Physical Exam Updated Vital Signs BP (!) 96/38   Pulse (!) 54   Temp 97.7 F (36.5 C) (Oral)   Resp 16   Ht 5\' 9"  (1.753 m)   Wt 98.3 kg   SpO2 94%   BMI 32.00 kg/m   Physical Exam Vitals signs and nursing note reviewed.  Constitutional:      Appearance: He is well-developed. He is ill-appearing.  HENT:     Head: Atraumatic.  Eyes:     Comments: Pupils are unequal, right pupil is 3 mm, left is 1 mm.  Neck:     Musculoskeletal: No neck rigidity.  Cardiovascular:     Rate and Rhythm: Tachycardia present.  Pulmonary:     Comments: Tachypnea  Abdominal:     Palpations: Abdomen is soft.     Tenderness: There is no abdominal tenderness.  Musculoskeletal:     Right lower leg: No edema.     Left lower leg: No edema.  Skin:    General: Skin is warm.  Neurological:     Comments: Patient is confused. Moving all 4 extremities      ED Treatments / Results  Labs (all labs ordered are listed, but only abnormal results are displayed) Labs Reviewed  BLOOD CULTURE ID PANEL (REFLEXED) - Abnormal; Notable for the following components:      Result Value   Staphylococcus species DETECTED (*)    All other components within normal limits  COMPREHENSIVE METABOLIC PANEL - Abnormal; Notable for the following components:   Sodium 131 (*)    CO2 19 (*)    Glucose, Bld 176 (*)    BUN 25 (*)    Calcium 8.6 (*)    Albumin 3.4 (*)    AST 46 (*)    ALT 57 (*)    Total Bilirubin 2.0 (*)    All other components within normal limits  CBC WITH DIFFERENTIAL/PLATELET - Abnormal; Notable for the following components:   WBC 15.2 (*)    RBC 5.85 (*)    Hemoglobin 17.6 (*)    HCT 52.2 (*)    Neutro Abs 14.0 (*)    Lymphs Abs 0.4 (*)    Abs Immature Granulocytes 0.16 (*)    All other components within normal limits  URINALYSIS, ROUTINE W REFLEX MICROSCOPIC - Abnormal; Notable for the  following components:   Hgb urine dipstick MODERATE (*)    Ketones, ur 20 (*)    Protein, ur 100 (*)    Bacteria, UA RARE (*)    All other components within normal limits  CSF CELL COUNT WITH DIFFERENTIAL - Abnormal; Notable for the following components:   Appearance, CSF CLEAR (*)    RBC Count, CSF 2 (*)    All other components within normal limits  CSF CELL COUNT  WITH DIFFERENTIAL - Abnormal; Notable for the following components:   Appearance, CSF CLEAR (*)    RBC Count, CSF 2 (*)    All other components within normal limits  GLUCOSE, CSF - Abnormal; Notable for the following components:   Glucose, CSF 102 (*)    All other components within normal limits  PHOSPHORUS - Abnormal; Notable for the following components:   Phosphorus 2.2 (*)    All other components within normal limits  GLUCOSE, CAPILLARY - Abnormal; Notable for the following components:   Glucose-Capillary 184 (*)    All other components within normal limits  TROPONIN I - Abnormal; Notable for the following components:   Troponin I 0.03 (*)    All other components within normal limits  TROPONIN I - Abnormal; Notable for the following components:   Troponin I 0.03 (*)    All other components within normal limits  BASIC METABOLIC PANEL - Abnormal; Notable for the following components:   Potassium 3.2 (*)    Glucose, Bld 152 (*)    Calcium 7.2 (*)    All other components within normal limits  GLUCOSE, CAPILLARY - Abnormal; Notable for the following components:   Glucose-Capillary 125 (*)    All other components within normal limits  CBC WITH DIFFERENTIAL/PLATELET - Abnormal; Notable for the following components:   WBC 14.9 (*)    Platelets 142 (*)    Neutro Abs 11.6 (*)    Monocytes Absolute 1.8 (*)    Abs Immature Granulocytes 0.21 (*)    All other components within normal limits  COMPREHENSIVE METABOLIC PANEL - Abnormal; Notable for the following components:   CO2 20 (*)    Calcium 7.5 (*)    Total Protein  4.9 (*)    Albumin 2.4 (*)    Total Bilirubin 1.3 (*)    All other components within normal limits  CBC WITH DIFFERENTIAL/PLATELET - Abnormal; Notable for the following components:   WBC 13.3 (*)    Neutro Abs 10.2 (*)    Monocytes Absolute 1.3 (*)    Abs Immature Granulocytes 0.16 (*)    All other components within normal limits  COMPREHENSIVE METABOLIC PANEL - Abnormal; Notable for the following components:   Glucose, Bld 115 (*)    Calcium 8.1 (*)    Total Protein 5.5 (*)    Albumin 2.7 (*)    All other components within normal limits  GLUCOSE, CAPILLARY - Abnormal; Notable for the following components:   Glucose-Capillary 145 (*)    All other components within normal limits  CBG MONITORING, ED - Abnormal; Notable for the following components:   Glucose-Capillary 193 (*)    All other components within normal limits  CULTURE, BLOOD (ROUTINE X 2)  CULTURE, BLOOD (ROUTINE X 2)  URINE CULTURE  CSF CULTURE  GASTROINTESTINAL PANEL BY PCR, STOOL (REPLACES STOOL CULTURE)  MRSA PCR SCREENING  C DIFFICILE QUICK SCREEN W PCR REFLEX  LACTIC ACID, PLASMA  PROTIME-INR  INFLUENZA PANEL BY PCR (TYPE A & B)  PROTEIN, CSF  HERPES SIMPLEX VIRUS(HSV) DNA BY PCR  MAGNESIUM  HEPATITIS PANEL, ACUTE  HIV ANTIBODY (ROUTINE TESTING W REFLEX)  GLUCOSE, CAPILLARY    EKG EKG Interpretation  Date/Time:  Wednesday September 01 2018 05:41:46 EST Ventricular Rate:  119 PR Interval:    QRS Duration: 89 QT Interval:  314 QTC Calculation: 442 R Axis:   -36 Text Interpretation:  Sinus tachycardia Left axis deviation Probable anteroseptal infarct, old Nonspecific T abnormalities, lateral leads No acute  changes No old tracing to compare Confirmed by Varney Biles [phone]) on 09/01/2018 5:59:27 AM Also confirmed by Varney Biles [phone]), editor Philomena Doheny [phone])  on 09/01/2018 7:53:10 AM   Radiology No results found.  Procedures .Lumbar Puncture Date/Time: 09/01/2018 6:41 AM Performed by:  Varney Biles, MD Authorized by: Varney Biles, MD   Consent:    Consent obtained:  Written   Consent given by:  Spouse   Risks discussed:  Bleeding, infection, pain, headache and nerve damage Universal protocol:    Procedure explained and questions answered to patient or proxy's satisfaction: yes     Relevant documents present and verified: yes     Test results available and properly labeled: yes     Imaging studies available: yes     Required blood products, implants, devices, and special equipment available: yes     Immediately prior to procedure a time out was called: yes     Site/side marked: yes   Pre-procedure details:    Procedure purpose:  Diagnostic Sedation:    Sedation type:  Anxiolysis Anesthesia (see MAR for exact dosages):    Anesthesia method:  Local infiltration   Local anesthetic:  Lidocaine 1% WITH epi Procedure details:    Lumbar space:  L4-L5 interspace   Patient position:  L lateral decubitus   Needle gauge:  18   Number of attempts:  1   Fluid appearance:  Clear   Tubes of fluid:  4   Total volume (ml):  12 Post-procedure:    Puncture site:  Adhesive bandage applied .Critical Care Performed by: Varney Biles, MD Authorized by: Varney Biles, MD   Critical care provider statement:    Critical care time (minutes):  45   Critical care start time:  09/01/2018 5:30 AM   Critical care end time:  09/01/2018 6:50 AM   Critical care time was exclusive of:  Separately billable procedures and treating other patients   Critical care was necessary to treat or prevent imminent or life-threatening deterioration of the following conditions:  CNS failure or compromise and sepsis   Critical care was time spent personally by me on the following activities:  Discussions with consultants, evaluation of patient's response to treatment, examination of patient, ordering and performing treatments and interventions, ordering and review of laboratory studies, ordering and  review of radiographic studies, pulse oximetry, re-evaluation of patient's condition, obtaining history from patient or surrogate, review of old charts and development of treatment plan with patient or surrogate   (including critical care time)  Medications Ordered in ED Medications  acetaminophen (TYLENOL) tablet 650 mg (650 mg Oral Given 09/01/18 2043)    Or  acetaminophen (TYLENOL) suppository 650 mg ( Rectal See Alternative 09/01/18 2043)  ondansetron (ZOFRAN) tablet 4 mg ( Oral See Alternative 09/01/18 2043)    Or  ondansetron (ZOFRAN) injection 4 mg (4 mg Intravenous Given 09/01/18 2043)  0.9 %  sodium chloride infusion ( Intravenous Rate/Dose Change 09/02/18 0313)  metroNIDAZOLE (FLAGYL) IVPB 500 mg (500 mg Intravenous New Bag/Given 09/03/18 0800)  MEDLINE mouth rinse (15 mLs Mouth Rinse Given 09/02/18 2210)  perflutren lipid microspheres (DEFINITY) IV suspension (4 mLs Intravenous Given 09/01/18 1048)  dextrose 5 %-0.45 % sodium chloride infusion ( Intravenous New Bag/Given 09/03/18 0448)  ceFAZolin (ANCEF) IVPB 2g/100 mL premix (2 g Intravenous New Bag/Given 09/03/18 0612)  insulin aspart (novoLOG) injection 0-9 Units (1 Units Subcutaneous Given 09/03/18 0757)  insulin aspart (novoLOG) injection 0-5 Units (0 Units Subcutaneous Not Given 09/02/18 2210)  acetaminophen (TYLENOL) suppository 650 mg (650 mg Rectal Given 09/01/18 0517)  ceFEPIme (MAXIPIME) 2 g in sodium chloride 0.9 % 100 mL IVPB (0 g Intravenous Stopped 09/01/18 0616)  vancomycin (VANCOCIN) IVPB 1000 mg/200 mL premix (0 mg Intravenous Stopped 09/01/18 0643)  lactated ringers bolus 2,000 mL (2,000 mLs Intravenous New Bag/Given 09/01/18 0644)  midazolam (VERSED) injection 2 mg (2 mg Intravenous Given 09/01/18 0615)  metroNIDAZOLE (FLAGYL) IVPB 500 mg (500 mg Intravenous New Bag/Given 09/01/18 0650)  acyclovir (ZOVIRAX) 950 mg in dextrose 5 % 150 mL IVPB ( Intravenous Stopped 09/01/18 0944)  sodium chloride 0.9 % bolus 1,000 mL (  Intravenous Rate/Dose Verify 09/01/18 1000)  potassium PHOSPHATE 10 mmol in dextrose 5 % 250 mL infusion ( Intravenous Stopped 09/01/18 1618)  sodium chloride 0.9 % bolus 1,000 mL (0 mLs Intravenous Stopped 09/01/18 1121)  ketorolac (TORADOL) 15 MG/ML injection 15 mg (15 mg Intravenous Given 09/01/18 1035)  lactated ringers bolus 1,000 mL ( Intravenous Stopped 09/01/18 1510)  potassium chloride 10 mEq in 100 mL IVPB (0 mEq Intravenous Stopped 09/01/18 1637)  0.9 %  sodium chloride infusion (0 mLs Intravenous Stopped 09/02/18 0008)  sodium chloride 0.9 % bolus 500 mL (500 mLs Intravenous New Bag/Given 09/02/18 0245)  vancomycin (VANCOCIN) 1,500 mg in sodium chloride 0.9 % 500 mL IVPB (0 mg Intravenous Stopped 09/02/18 1217)  dextrose 50 % solution 12.5 g (12.5 g Intravenous Given 09/02/18 1433)  traZODone (DESYREL) tablet 50 mg (50 mg Oral Given 09/02/18 2316)     Initial Impression / Assessment and Plan / ED Course  I have reviewed the triage vital signs and the nursing notes.  Pertinent labs & imaging results that were available during my care of the patient were reviewed by me and considered in my medical decision making (see chart for details).  Clinical Course as of Sep 03 800  Wed Sep 01, 2018  7793 Lactic acid, plasma [LJ]  0642 White count is elevated at 15.2.  With that the urine analysis did not reveal any signs of infection.  Influenza PCR and chest x-ray also did not reveal source of infection.  CT head does not reveal any acute findings.  Discussed these findings with the patient's wife, and she has consented to lumbar puncture.  CBC with Differential(!) [AN]  [id number] Fu csf--add ampicillin and acyclovir if needed   [MB]    Clinical Course User Index [AN] Varney Biles, MD [LJ] Deboraha Sprang, Student-PA [MB] Maudie Flakes, MD    DDx includes: ICH / Stroke Sepsis syndrome Infection - UTI/Pneumonia Encephalopathy /meningitis Electrolyte abnormality Drug  overdose DKA Hypercapnia / COPD Hypoxia Influenza  73 year old male comes in with chief complaint of altered mental status.  He is noted to be tachycardic, tachypneic and febrile.  He has history of diabetes, and it appears that he has been getting sick over the weekend, with the confusion starting this morning.  It appears that the confusion was sudden, therefore chances of meningitis is low.  We will start with basic lab work-up, CT head to ensure there is no intracranial bleed and screening for common infection.  Patient will be admitted to the hospital for further work-up.  We will start broad-spectrum antibiotics.  Code sepsis has been activated.  8:02 AM Admitting patient to Dr. Blaine Hamper, hospitalist service.  Lumbar puncture was completed successfully.  CSF appears clear.  Family updated. Antibiotics ordered: Vancomycin, cefepime, Flagyl.  Dr. Blaine Hamper and I discussed the case again once the LP was completed.  He is going to pass a message to the admitting team to follow-up on the CSF results and start ampicillin and acyclovir if needed.    Final Clinical Impressions(s) / ED Diagnoses   Final diagnoses:  Severe sepsis ([place])  Altered mental status, unspecified altered mental status type  Hypoxia    ED Discharge Orders    None       Varney Biles, MD 09/01/18 2637    Varney Biles, MD 09/03/18 8588

## 2018-09-01 NOTE — ED Triage Notes (Signed)
Per ems: pt coming from home c/o altered mental status and generalized weakness. Last seen normal at 11pm. A&Ox1 currently (person only)  88% SpO2 initial, 94% after 4L  Capnography 29

## 2018-09-01 NOTE — Progress Notes (Signed)
Patient has not voided this shift. Bladder scan perform this afternoon, with 89 mL of volume. Patient states he would like to use the bathroom to urinate and does not want the external catheter. Patient educated that he is not stable enough at this time to ambulate to the bathroom. Option given to use urinal instead but patient continues to refuse.  Will continue to monitor.

## 2018-09-02 LAB — CBC WITH DIFFERENTIAL/PLATELET
Abs Immature Granulocytes: 0.21 10*3/uL — ABNORMAL HIGH (ref 0.00–0.07)
Basophils Absolute: 0.1 10*3/uL (ref 0.0–0.1)
Basophils Relative: 1 %
EOS ABS: 0 10*3/uL (ref 0.0–0.5)
Eosinophils Relative: 0 %
HCT: 43.6 % (ref 39.0–52.0)
Hemoglobin: 14 g/dL (ref 13.0–17.0)
Immature Granulocytes: 1 %
Lymphocytes Relative: 8 %
Lymphs Abs: 1.2 10*3/uL (ref 0.7–4.0)
MCH: 30.4 pg (ref 26.0–34.0)
MCHC: 32.1 g/dL (ref 30.0–36.0)
MCV: 94.6 fL (ref 80.0–100.0)
Monocytes Absolute: 1.8 10*3/uL — ABNORMAL HIGH (ref 0.1–1.0)
Monocytes Relative: 12 %
Neutro Abs: 11.6 10*3/uL — ABNORMAL HIGH (ref 1.7–7.7)
Neutrophils Relative %: 78 %
Platelets: 142 10*3/uL — ABNORMAL LOW (ref 150–400)
RBC: 4.61 MIL/uL (ref 4.22–5.81)
RDW: 14.7 % (ref 11.5–15.5)
WBC: 14.9 10*3/uL — AB (ref 4.0–10.5)
nRBC: 0 % (ref 0.0–0.2)

## 2018-09-02 LAB — URINE CULTURE: Culture: NO GROWTH

## 2018-09-02 LAB — BLOOD CULTURE ID PANEL (REFLEXED)
ACINETOBACTER BAUMANNII: NOT DETECTED
Candida albicans: NOT DETECTED
Candida glabrata: NOT DETECTED
Candida krusei: NOT DETECTED
Candida parapsilosis: NOT DETECTED
Candida tropicalis: NOT DETECTED
Enterobacter cloacae complex: NOT DETECTED
Enterobacteriaceae species: NOT DETECTED
Enterococcus species: NOT DETECTED
Escherichia coli: NOT DETECTED
Haemophilus influenzae: NOT DETECTED
Klebsiella oxytoca: NOT DETECTED
Klebsiella pneumoniae: NOT DETECTED
LISTERIA MONOCYTOGENES: NOT DETECTED
Methicillin resistance: NOT DETECTED
Neisseria meningitidis: NOT DETECTED
Proteus species: NOT DETECTED
Pseudomonas aeruginosa: NOT DETECTED
STREPTOCOCCUS AGALACTIAE: NOT DETECTED
Serratia marcescens: NOT DETECTED
Staphylococcus aureus (BCID): NOT DETECTED
Staphylococcus species: DETECTED — AB
Streptococcus pneumoniae: NOT DETECTED
Streptococcus pyogenes: NOT DETECTED
Streptococcus species: NOT DETECTED

## 2018-09-02 LAB — COMPREHENSIVE METABOLIC PANEL
ALT: 41 U/L (ref 0–44)
ANION GAP: 6 (ref 5–15)
AST: 36 U/L (ref 15–41)
Albumin: 2.4 g/dL — ABNORMAL LOW (ref 3.5–5.0)
Alkaline Phosphatase: 46 U/L (ref 38–126)
BUN: 19 mg/dL (ref 8–23)
CO2: 20 mmol/L — ABNORMAL LOW (ref 22–32)
Calcium: 7.5 mg/dL — ABNORMAL LOW (ref 8.9–10.3)
Chloride: 111 mmol/L (ref 98–111)
Creatinine, Ser: 0.85 mg/dL (ref 0.61–1.24)
GFR calc non Af Amer: >60 mL/min (ref >60)
Glucose, Bld: 89 mg/dL (ref 70–99)
POTASSIUM: 3.9 mmol/L (ref 3.5–5.1)
Sodium: 137 mmol/L (ref 135–145)
Total Bilirubin: 1.3 mg/dL — ABNORMAL HIGH (ref 0.3–1.2)
Total Protein: 4.9 g/dL — ABNORMAL LOW (ref 6.5–8.1)

## 2018-09-02 LAB — GLUCOSE, CAPILLARY: Glucose-Capillary: 145 mg/dL — ABNORMAL HIGH (ref 70–99)

## 2018-09-02 LAB — GASTROINTESTINAL PANEL BY PCR, STOOL (REPLACES STOOL CULTURE)

## 2018-09-02 LAB — HIV ANTIBODY (ROUTINE TESTING W REFLEX): HIV Screen 4th Generation wRfx: NONREACTIVE

## 2018-09-02 LAB — HSV DNA BY PCR (REFERENCE LAB): HSV 1 DNA: NEGATIVE

## 2018-09-02 LAB — HERPES SIMPLEX VIRUS(HSV) DNA BY PCR: HSV 2 DNA: NEGATIVE

## 2018-09-02 LAB — HEPATITIS PANEL, ACUTE
HCV Ab: <0.1 s/co ratio (ref 0.0–0.9)
Hep A IgM: NEGATIVE
Hep B C IgM: NEGATIVE
Hepatitis B Surface Ag: NEGATIVE

## 2018-09-02 LAB — C DIFFICILE QUICK SCREEN W PCR REFLEX
C Diff antigen: NEGATIVE
C Diff interpretation: NOT DETECTED
C Diff toxin: NEGATIVE

## 2018-09-02 MED ORDER — DEXTROSE 50 % IV SOLN
INTRAVENOUS | Status: AC
  Administered 2018-09-02: 12.5 g via INTRAVENOUS
  Filled 2018-09-02: qty 50

## 2018-09-02 MED ORDER — DEXTROSE-NACL 5-0.45 % IV SOLN
INTRAVENOUS | Status: DC
  Administered 2018-09-02 – 2018-09-05 (×6): via INTRAVENOUS

## 2018-09-02 MED ORDER — CEFAZOLIN SODIUM-DEXTROSE 2-4 GM/100ML-% IV SOLN
2.0000 g | Freq: Three times a day (TID) | INTRAVENOUS | Status: DC
  Administered 2018-09-02 – 2018-09-07 (×15): 2 g via INTRAVENOUS
  Filled 2018-09-02 (×20): qty 100

## 2018-09-02 MED ORDER — VANCOMYCIN HCL 10 G IV SOLR
1500.0000 mg | Freq: Once | INTRAVENOUS | Status: AC
  Administered 2018-09-02: 1500 mg via INTRAVENOUS
  Filled 2018-09-02: qty 1500

## 2018-09-02 MED ORDER — INSULIN ASPART 100 UNIT/ML ~~LOC~~ SOLN
0.0000 [IU] | Freq: Three times a day (TID) | SUBCUTANEOUS | Status: DC
  Administered 2018-09-03 – 2018-09-05 (×4): 1 [IU] via SUBCUTANEOUS
  Administered 2018-09-05 – 2018-09-06 (×2): 2 [IU] via SUBCUTANEOUS
  Administered 2018-09-06 (×2): 1 [IU] via SUBCUTANEOUS

## 2018-09-02 MED ORDER — TRAZODONE HCL 50 MG PO TABS
50.0000 mg | ORAL_TABLET | Freq: Once | ORAL | Status: AC
  Administered 2018-09-02: 50 mg via ORAL
  Filled 2018-09-02: qty 1

## 2018-09-02 MED ORDER — INSULIN ASPART 100 UNIT/ML ~~LOC~~ SOLN: 0.0000 [IU] | Freq: Every day | SUBCUTANEOUS | Status: DC

## 2018-09-02 MED ORDER — VANCOMYCIN HCL IN DEXTROSE 750-5 MG/150ML-% IV SOLN
750.0000 mg | Freq: Two times a day (BID) | INTRAVENOUS | Status: DC
  Filled 2018-09-02: qty 150

## 2018-09-02 MED ORDER — SODIUM CHLORIDE 0.9 % IV BOLUS
500.0000 mL | Freq: Once | INTRAVENOUS | Status: AC
  Administered 2018-09-02: 500 mL via INTRAVENOUS

## 2018-09-02 MED ORDER — DEXTROSE 50 % IV SOLN
12.5000 g | INTRAVENOUS | Status: AC
  Administered 2018-09-02: 12.5 g via INTRAVENOUS

## 2018-09-02 NOTE — Progress Notes (Signed)
Hypoglycemic Event  CBG: 67  Treatment: D50 25 mL (12.5 gm)  Symptoms: None  Follow-up CBG: Time:1508 CBG Result:108  Possible Reasons for Event: Inadequate meal intake  Comments/MD notified: Paged Dr. Nevada Crane regarding CBG 67. Per protocol patient received D50 35ml IV. MD changed patient's IV fluids to have Dextrose in them. Will continue to monitor.     Mauricia Mertens, Dudley Major

## 2018-09-02 NOTE — Progress Notes (Signed)
While in patient's room obtaining VS and administering medication, RN noticed on the monitor that the patient was having a run of V.tach. Called central telemetry to confirm how many beats which ended up being a 10 beat run. Patient was laying in the bed asymptomatic during this time.  Patient has been SB all morning with frequent PVCs. MD made aware. New orders for EKG. Will continue to monitor.

## 2018-09-02 NOTE — Progress Notes (Signed)
Patient received first dose of IV Vanc today. Prior to medication administration and beginning of my shift at 0700, patient's face/neck/chest was flushed. Post infusion, patient's face/neck/chest remains flushed with possible increase in redness; however, patient does not have any other symptoms typical of red man syndrome. No itching/SOB etc. Patient is resting comfortably in the bed. Pharmacist paged regarding this and stated she will lengthen infusion time for the next dose. MD paged as well. Awaiting orders. Will continue to monitor.

## 2018-09-02 NOTE — Progress Notes (Signed)
PROGRESS NOTE  Michael Hines IWL:[id number] DOB: 1946/06/18 DOA: 09/01/2018 PCP: Finis Bud, MD  HPI/Recap of past 24 hours: Michael Hines is a 73 y.o. male with medical history significant of anxiety, B12 deficiency, back pain, type 2 diabetes, hyperlipidemia, testosterone insufficiency, aortic insufficiency with a history of aortic valve replacement who is coming to the emergency department with altered mental status and fever 3 days after receiving the influenza vaccine. TRH asked to admit for sepsis due to undetermined organism.  Infectious disease consulted and following.  09/02/2018: Patient seen and examined at his bedside.  Continues to have diarrhea.  GI panel negative.  C. difficile PCR pending.   Assessment/Plan: Principal Problem:   Sepsis due to undetermined organism [facility]) Active Problems:   Diabetes mellitus without complication (HCC)   Hyponatremia   Polycythemia   Abnormal LFTs   Hypophosphatemia   Altered mental status   Sepsis secondary to gram-positive cocci bacteremia versus others Presented with fever with T-max of 104.4, tachycardia with heart rate of 116, tachypnea with respiration rate of 30, hypotension requiring boluses of IV fluids to maintain a map greater than 65 Blood cultures drawn on 09/01/2018 revealed gram-positive cocci Currently on broad-spectrum empiric antibiotics Infectious disease also following Continue antibiotics as recommended by infectious disease: IV vancomycin, cefepime, and IV Flagyl Continue to monitor fever curve and WBC Obtain CBC in the morning Repeat blood cultures x2 peripherally Hepatitis A, B, and C screening negative Influenza AMB negative CSF Gram stain negative  Acute metabolic encephalopathy suspect secondary to above Still confused this morning N.p.o. until more alert Continue IV fluid Continue IV antibiotics Continue supportive care  Diarrhea, persistent Unclear etiology GI panel negative C.  difficile PCR pending Continue to monitor electrolytes and vital signs  Hypotension Maintain map greater than 65 Continue IV fluid Continue to closely monitor vital signs  Hypokalemia Resolved post repletion Repeat BMP in the morning  Type 2 diabetes On glipizide at home Obtain A1c Continue insulin sliding scale   DVT prophylaxis: SCDs. Code Status: Full code. Family Communication:  None at bedside Disposition Plan:  Home when clinically stable Consults called: Infectious diseases Karolee Ohs, MD).  Objective: Vitals:   09/02/18 0900 09/02/18 1015 09/02/18 1035 09/02/18 1115  BP: 128/73 (!) 94/43  (!) 112/49  Pulse: (!) 53  (!) 46 (!) 45  Resp:      Temp:      TempSrc:      SpO2: 94% 94% 94% 96%  Weight:      Height:        Intake/Output Summary (Last 24 hours) at 09/02/2018 1350 Last data filed at 09/02/2018 1307 Gross per 24 hour  Intake 5078.64 ml  Output 1100 ml  Net 3978.64 ml   Filed Weights   09/01/18 0800  Weight: 94.1 kg    Exam:  . General: 73 y.o. year-old male well developed well nourished in no acute distress.  Alert and confused. . Cardiovascular: Regular rate and rhythm with no rubs or gallops.  No thyromegaly or JVD noted.   Marland Kitchen Respiratory: Clear to auscultation with no wheezes or rales. Good inspiratory effort. . Abdomen: Soft nontender nondistended with normal bowel sounds x4 quadrants. . Musculoskeletal: No lower extremity edema. 2/4 pulses in all 4 extremities. Marland Kitchen Psychiatry: Unable to assess mood due to confusion   Data Reviewed: CBC: Recent Labs  Lab 09/01/18 0501 09/02/18 0531  WBC 15.2* 14.9*  NEUTROABS 14.0* 11.6*  HGB 17.6* 14.0  HCT 52.2* 43.6  MCV 89.2 94.6  PLT 158 413*   Basic Metabolic Panel: Recent Labs  Lab 09/01/18 0501 09/01/18 1129 09/02/18 0531  NA 131* 136 137  K 3.8 3.2* 3.9  CL 99 106 111  CO2 19* 23 20*  GLUCOSE 176* 152* 89  BUN 25* 22 19  CREATININE 0.98 0.98 0.85  CALCIUM 8.6* 7.2* 7.5*    MG 2.2  --   --   PHOS 2.2*  --   --    GFR: Estimated Creatinine Clearance: 91.6 mL/min (by C-G formula based on SCr of 0.85 mg/dL). Liver Function Tests: Recent Labs  Lab 09/01/18 0501 09/02/18 0531  AST 46* 36  ALT 57* 41  ALKPHOS 72 46  BILITOT 2.0* 1.3*  PROT 7.0 4.9*  ALBUMIN 3.4* 2.4*   No results for input(s): LIPASE, AMYLASE in the last 168 hours. No results for input(s): AMMONIA in the last 168 hours. Coagulation Profile: Recent Labs  Lab 09/01/18 0501  INR 1.08   Cardiac Enzymes: Recent Labs  Lab 09/01/18 1129 09/01/18 1647  TROPONINI 0.03* 0.03*   BNP (last 3 results) No results for input(s): PROBNP in the last 8760 hours. HbA1C: No results for input(s): HGBA1C in the last 72 hours. CBG: Recent Labs  Lab 09/01/18 0517 09/01/18 0837 09/01/18 1417 09/01/18 2218  GLUCAP 193* 184* 125* 95   Lipid Profile: No results for input(s): CHOL, HDL, LDLCALC, TRIG, CHOLHDL, LDLDIRECT in the last 72 hours. Thyroid Function Tests: No results for input(s): TSH, T4TOTAL, FREET4, T3FREE, THYROIDAB in the last 72 hours. Anemia Panel: No results for input(s): VITAMINB12, FOLATE, FERRITIN, TIBC, IRON, RETICCTPCT in the last 72 hours. Urine analysis:    Component Value Date/Time   COLORURINE YELLOW 09/01/2018 [place] 09/01/2018 0514   LABSPEC 1.025 09/01/2018 0514   PHURINE 5.0 09/01/2018 0514   GLUCOSEU NEGATIVE 09/01/2018 0514   HGBUR MODERATE (A) 09/01/2018 0514   BILIRUBINUR NEGATIVE 09/01/2018 0514   KETONESUR 20 (A) 09/01/2018 0514   PROTEINUR 100 (A) 09/01/2018 0514   NITRITE NEGATIVE 09/01/2018 0514   LEUKOCYTESUR NEGATIVE 09/01/2018 0514   Sepsis Labs: @LABRCNTIP (procalcitonin:4,lacticidven:4)  ) Recent Results (from the past 240 hour(s))  Culture, blood (Routine x 2)     Status: None (Preliminary result)   Collection Time: 09/01/18  5:03 AM  Result Value Ref Range Status   Specimen Description   Final    BLOOD BLOOD LEFT  FOREARM Performed at [place] [address] [place], [place] [address]    Special Requests   Final    BOTTLES DRAWN AEROBIC AND ANAEROBIC Blood Culture adequate volume Performed at [facility] [address] [place], [place] [address]    Culture  Setup Time   Final    GRAM POSITIVE COCCI AEROBIC BOTTLE ONLY Organism ID to follow CRITICAL RESULT CALLED TO, READ BACK BY AND VERIFIED WITH: Minette Brine [id number] MLM Performed at [facility], [place] [address], [place], [place] [address]    Culture GRAM POSITIVE COCCI  Final   Report Status PENDING  Incomplete  Culture, blood (Routine x 2)     Status: None (Preliminary result)   Collection Time: 09/01/18  5:03 AM  Result Value Ref Range Status   Specimen Description   Final    BLOOD BLOOD RIGHT FOREARM Performed at [facility] [address], [place], [place] [address]    Special Requests   Final    BOTTLES DRAWN AEROBIC  AND ANAEROBIC Blood Culture adequate volume Performed at [facility] [address], [place], [place] [address]    Culture   Final    NO GROWTH 1 DAY Performed at [facility], [place] [address] [place], [place] [address]    Report Status PENDING  Incomplete  Blood Culture ID Panel (Reflexed)     Status: Abnormal   Collection Time: 09/01/18  5:03 AM  Result Value Ref Range Status   Enterococcus species NOT DETECTED NOT DETECTED Final   Listeria monocytogenes NOT DETECTED NOT DETECTED Final   Staphylococcus species DETECTED (A) NOT DETECTED Final    Comment: Methicillin (oxacillin) susceptible coagulase negative staphylococcus. Possible blood culture contaminant (unless isolated from more than one blood culture draw or clinical case suggests pathogenicity). No antibiotic treatment is indicated for blood  culture contaminants. CRITICAL RESULT CALLED TO, READ BACK BY AND VERIFIED WITH: Minette Brine [id number] MLM     Staphylococcus aureus (BCID) NOT DETECTED NOT DETECTED Final   Methicillin resistance NOT DETECTED NOT DETECTED Final   Streptococcus species NOT DETECTED NOT DETECTED Final   Streptococcus agalactiae NOT DETECTED NOT DETECTED Final   Streptococcus pneumoniae NOT DETECTED NOT DETECTED Final   Streptococcus pyogenes NOT DETECTED NOT DETECTED Final   Acinetobacter baumannii NOT DETECTED NOT DETECTED Final   Enterobacteriaceae species NOT DETECTED NOT DETECTED Final   Enterobacter cloacae complex NOT DETECTED NOT DETECTED Final   Escherichia coli NOT DETECTED NOT DETECTED Final   Klebsiella oxytoca NOT DETECTED NOT DETECTED Final   Klebsiella pneumoniae NOT DETECTED NOT DETECTED Final   Proteus species NOT DETECTED NOT DETECTED Final   Serratia marcescens NOT DETECTED NOT DETECTED Final   Haemophilus influenzae NOT DETECTED NOT DETECTED Final   Neisseria meningitidis NOT DETECTED NOT DETECTED Final   Pseudomonas aeruginosa NOT DETECTED NOT DETECTED Final   Candida albicans NOT DETECTED NOT DETECTED Final   Candida glabrata NOT DETECTED NOT DETECTED Final   Candida krusei NOT DETECTED NOT DETECTED Final   Candida parapsilosis NOT DETECTED NOT DETECTED Final   Candida tropicalis NOT DETECTED NOT DETECTED Final    Comment: Performed at [facility], [address] [place], [place] [address]  Urine culture     Status: None   Collection Time: 09/01/18  5:07 AM  Result Value Ref Range Status   Specimen Description   Final    URINE, CATHETERIZED Performed at [facility] [address], [place], [place] [address]    Special Requests   Final    NONE Performed at [facility], [place] [address] [place], [place] [address]    Culture   Final    NO GROWTH Performed at [facility], [place] [address], [place], [place] [address]    Report Status 09/02/2018 FINAL  Final  CSF culture     Status: None (Preliminary result)   Collection Time:  09/01/18  6:43 AM  Result Value Ref Range Status   Specimen Description   Final    CSF Performed at [facility] [address] [place], [place] [address]    Special Requests   Final    NONE Performed at [facility], [place] [address] [place],  [address]    Gram Stain   Final    WBC PRESENT, PREDOMINANTLY MONONUCLEAR NO ORGANISMS SEEN CYTOSPIN SMEAR Gram Stain Report Called to,Read Back By and Verified With: SMITH,T. RN @0738  ON 1.22.2020 BY [email or web address]  Culture   Final    NO GROWTH 1 DAY Performed at [facility], [place] [address] [place], [place] [address]    Report Status PENDING  Incomplete  MRSA PCR Screening     Status: None   Collection Time: 09/01/18  7:55 AM  Result Value Ref Range Status   MRSA by PCR NEGATIVE NEGATIVE Final    Comment:        The GeneXpert MRSA Assay (FDA approved for NASAL specimens only), is one component of a comprehensive MRSA colonization surveillance program. It is not intended to diagnose MRSA infection nor to guide or monitor treatment for MRSA infections. Performed at [facility], [place] [address], [place], [place] [address]   Gastrointestinal Panel by PCR , Stool     Status: None   Collection Time: 09/02/18  8:54 AM  Result Value Ref Range Status   Campylobacter species NOT DETECTED NOT DETECTED Final   Plesimonas shigelloides NOT DETECTED NOT DETECTED Final   Salmonella species NOT DETECTED NOT DETECTED Final   Yersinia enterocolitica NOT DETECTED NOT DETECTED Final   Vibrio species NOT DETECTED NOT DETECTED Final   Vibrio cholerae NOT DETECTED NOT DETECTED Final   Enteroaggregative E coli (EAEC) NOT DETECTED NOT DETECTED Final   Enteropathogenic E coli (EPEC) NOT DETECTED NOT DETECTED Final   Enterotoxigenic E coli (ETEC) NOT DETECTED NOT DETECTED Final   Shiga like toxin producing E coli (STEC) NOT DETECTED NOT DETECTED Final   Shigella/Enteroinvasive E coli  (EIEC) NOT DETECTED NOT DETECTED Final   Cryptosporidium NOT DETECTED NOT DETECTED Final   Cyclospora cayetanensis NOT DETECTED NOT DETECTED Final   Entamoeba histolytica NOT DETECTED NOT DETECTED Final   Giardia lamblia NOT DETECTED NOT DETECTED Final   Adenovirus F40/41 NOT DETECTED NOT DETECTED Final   Astrovirus NOT DETECTED NOT DETECTED Final   Norovirus GI/GII NOT DETECTED NOT DETECTED Final   Rotavirus A NOT DETECTED NOT DETECTED Final   Sapovirus (I, II, IV, and V) NOT DETECTED NOT DETECTED Final    Comment: Performed at [facility], [address], [place], [place] [address]      Studies: No results found.  Scheduled Meds: . insulin aspart  0-9 Units Subcutaneous Q8H  . mouth rinse  15 mL Mouth Rinse BID  . oseltamivir  75 mg Oral BID    Continuous Infusions: . sodium chloride 999 mL/hr at 09/02/18 0300  . 0.9 % NaCl with KCl 20 mEq / L 100 mL/hr at 09/02/18 1113  . ceFEPime (MAXIPIME) IV Stopped (09/02/18 3845)  . metronidazole Stopped (09/02/18 0936)  . vancomycin       LOS: 1 day     Kayleen Memos, MD [facility] Pager [phone]  If 7PM-7AM, please contact night-coverage [email or web address] Password [email or web address] 09/02/2018, 1:50 PM

## 2018-09-02 NOTE — Progress Notes (Signed)
Pharmacy Antibiotic Note  Michael Hines is a 73 y.o. male admitted on 09/01/2018 with fever, AMS, sepsis, unknown source. Pharmacy consulted for vancomycin and cefepime dosing. ID following.  Assessment: Today, 09/02/18   WBC 14.9 remains elevated  SCr 0.85 - stable, WNL.   Tmax 101.1 F in past 24 hours. Currently afebrile  Plan:  Metronidazole 500mg  IV q8h per MD  Continue cefepime 2g IV q8h  Vancomycin 1000 mg dose given in ED 1/22 AM. Will give vancomycin 1500 mg IV loading dose now followed by vancomycin 750 mg IV q12h for estimated AUC of 445  Goal AUC 400-500  SCr daily  Follow up renal fxn, culture results, and clinical course.  Height: 5\' 9"  (175.3 cm) Weight: 207 lb 7.3 oz (94.1 kg) IBW/kg (Calculated) : 70.7  TBW is 133% of IBW; BMI 30.64  Temp (24hrs), Avg:97.9 F (36.6 C), Min:97.5 F (36.4 C), Max:98.9 F (37.2 C)  Recent Labs  Lab 09/01/18 0501 09/01/18 1129 09/02/18 0531  WBC 15.2*  --  14.9*  CREATININE 0.98 0.98 0.85  LATICACIDVEN 1.4  --   --     Estimated Creatinine Clearance: 91.6 mL/min (by C-G formula based on SCr of 0.85 mg/dL).    No Known Allergies  Antimicrobials this admission: 1/22 Acyclovir one dose 1/22 Vancomycin >> 1/22 Cefepime >> 1/22 Metronidazole >>  Dose adjustments this admission:  Microbiology results: 1/22 BCx: 1/4 GPC. Staphylococcus species. Sensitivities pending. 1/22 UCx:  ngf 1/22 MRSA PCR: negative 1/22 CSF cxt: no org on gram stain; cxt pending 1/22 CSF, HSV PCR:  In process 1/22 GI Panel: Sent 1/22 Influenza a/b:  Neg/neg 1/22 HIV Ab: in process  Thank you for allowing pharmacy to be a part of this patient's care.  [name], PharmD Pager: [phone] 09/02/18 9:25 AM

## 2018-09-02 NOTE — Progress Notes (Signed)
PHARMACY - PHYSICIAN COMMUNICATION CRITICAL VALUE ALERT - BLOOD CULTURE IDENTIFICATION (BCID)  Michael Hines is an 73 y.o. male who presented to [facility] on 09/01/2018 with a chief complaint of fever and AMS  Assessment:  Sepsis. ID following  Name of physician (or Provider) Contacted: Dr. Baxter Flattery  Current antibiotics: cefepime and metronidazole BCID: 1/4 staphylococcus species, mecA not detected  Changes to prescribed antibiotics recommended: Pt was hypotensive overnight. Will continue cefepime and metronidazole and add vancomycin per ID MD and follow culture data.  Results for orders placed or performed during the hospital encounter of 09/01/18  Blood Culture ID Panel (Reflexed) (Collected: 09/01/2018  5:03 AM)  Result Value Ref Range   Enterococcus species NOT DETECTED NOT DETECTED   Listeria monocytogenes NOT DETECTED NOT DETECTED   Staphylococcus species DETECTED (A) NOT DETECTED   Staphylococcus aureus (BCID) NOT DETECTED NOT DETECTED   Methicillin resistance NOT DETECTED NOT DETECTED   Streptococcus species NOT DETECTED NOT DETECTED   Streptococcus agalactiae NOT DETECTED NOT DETECTED   Streptococcus pneumoniae NOT DETECTED NOT DETECTED   Streptococcus pyogenes NOT DETECTED NOT DETECTED   Acinetobacter baumannii NOT DETECTED NOT DETECTED   Enterobacteriaceae species NOT DETECTED NOT DETECTED   Enterobacter cloacae complex NOT DETECTED NOT DETECTED   Escherichia coli NOT DETECTED NOT DETECTED   Klebsiella oxytoca NOT DETECTED NOT DETECTED   Klebsiella pneumoniae NOT DETECTED NOT DETECTED   Proteus species NOT DETECTED NOT DETECTED   Serratia marcescens NOT DETECTED NOT DETECTED   Haemophilus influenzae NOT DETECTED NOT DETECTED   Neisseria meningitidis NOT DETECTED NOT DETECTED   Pseudomonas aeruginosa NOT DETECTED NOT DETECTED   Candida albicans NOT DETECTED NOT DETECTED   Candida glabrata NOT DETECTED NOT DETECTED   Candida krusei NOT DETECTED NOT DETECTED   Candida parapsilosis NOT DETECTED NOT DETECTED   Candida tropicalis NOT DETECTED NOT DETECTED    [name] 09/02/2018  9:07 AM

## 2018-09-02 NOTE — Progress Notes (Signed)
Gave a second 521mL Bolus per Baltazar Najjar at 0245 because patient became hypotensive again. Pt removed his nasal cannula and refused to put it back on. Pt currently saturating in the mid-90s. Will continue to monitor.  Mariann Laster, RN

## 2018-09-02 NOTE — Progress Notes (Addendum)
Michael Hines for Infectious Disease    Date of Admission:  09/01/2018   Total days of antibiotics 3   ID: Michael Hines is a 73 y.o. male with  Sepsis of unknown source. Admitted for ILI symptoms, plus diarrhea Principal Problem:   Sepsis due to undetermined organism [facility]) Active Problems:   Diabetes mellitus without complication ([place])   Hyponatremia   Polycythemia   Abnormal LFTs   Hypophosphatemia   Altered mental status    Subjective: Afebrile, slept poorly. Having episodes of hypoglycemia noted. Needed bolus overnight for low BP  ROS: nausea, no vomiting, no abdominal pain  Medications:  . insulin aspart  0-9 Units Subcutaneous Q8H  . mouth rinse  15 mL Mouth Rinse BID  . oseltamivir  75 mg Oral BID    Objective: Vital signs in last 24 hours: Temp:  [97.5 F (36.4 C)-98 F (36.7 C)] 98 F (36.7 C) (01/23 1200) Pulse Rate:  [45-69] 45 (01/23 1115) Resp:  [17-26] 19 (01/23 0315) BP: (86-128)/(29-94) 112/49 (01/23 1115) SpO2:  [90 %-99 %] 96 % (01/23 1115)  Physical Exam  Constitutional: He is oriented to person, place, and time. He appears well-developed and well-nourished. No distress.  HENT:  Mouth/Throat: Oropharynx is clear and moist. No oropharyngeal exudate.  Cardiovascular: Normal rate, regular rhythm and normal heart sounds. Exam reveals no gallop and no friction rub.  No murmur heard.  Pulmonary/Chest: Effort normal and breath sounds normal. No respiratory distress. He has no wheezes.  Abdominal: Soft. Bowel sounds are present, slow.  Mild distension. There is no tenderness.  Neurological: He is alert and oriented to person, place, and time.  Skin: Skin is warm and dry. No rash noted. Slight erythema to chest  Psychiatric: He has a normal mood and affect. His behavior is normal.    Lab Results Recent Labs    09/01/18 0501 09/01/18 1129 09/02/18 0531  WBC 15.2*  --  14.9*  HGB 17.6*  --  14.0  HCT 52.2*  --  43.6  NA 131* 136 137  K  3.8 3.2* 3.9  CL 99 106 111  CO2 19* 23 20*  BUN 25* 22 19  CREATININE 0.98 0.98 0.85   Liver Panel Recent Labs    09/01/18 0501 09/02/18 0531  PROT 7.0 4.9*  ALBUMIN 3.4* 2.4*  AST 46* 36  ALT 57* 41  ALKPHOS 72 46  BILITOT 2.0* 1.3*    Microbiology: Csf cx ngtd- Blood cx 1 of 4 GPC -staph species - oxacillin sensitive Studies/Results: Dg Chest 2 View  Result Date: 09/01/2018 CLINICAL DATA:  Suspected sepsis EXAM: CHEST - 2 VIEW COMPARISON:  06/25/2015 FINDINGS: Normal heart size. Aortic tortuosity. Prior median sternotomy for aortic valve replacement. Interstitial coarsening similar to prior. There is no edema, consolidation, effusion, or pneumothorax. Artifact from EKG leads. IMPRESSION: No acute finding when compared to prior. Electronically Signed   By: Monte Fantasia M.D.   On: 09/01/2018 05:46   Ct Head Wo Contrast  Result Date: 09/01/2018 CLINICAL DATA:  Altered level of consciousness EXAM: CT HEAD WITHOUT CONTRAST TECHNIQUE: Contiguous axial images were obtained from the base of the skull through the vertex without intravenous contrast. COMPARISON:  None. FINDINGS: Brain: No evidence of acute infarction, hemorrhage, hydrocephalus, extra-axial collection or mass lesion/mass effect. Central predominant cerebral volume loss. 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 Vascular: No hyperdense vessel or unexpected calcification. Skull: Negative Sinuses/Orbits: Negative IMPRESSION: No acute finding. Electronically Signed   By: Michael Hines  Watts M.D.   On: 09/01/2018 05:40     Assessment/Plan: 73yo M with HTN, IDDM admitted for sepsis of unknown etiology. Fever, myalgia, N/D, AMS. Found to have leukocytosis with left shift, mild hypotension.  Has had 1 large watery stool this am, but nurse reports small smears thereafter. GI PCR negative (including for CDIFF) Blood cx showing 1 of 4 bottles for staph species that I suspect maybe CoNS. For his presentation, I would  expect more of his blood cx sets to be + for specific pathogen  He appears only slightly better, able to converse, no encephalopathy, no fever, but still feels poorly   in terms of isolation - can keep on enteric since having diarrhea. Can discontinue droplet  Will discontinue vancomycin since no MRSA or MecA found. Will d/c tamiflu since flu screen is negative  Bacteremia = Continue on cefazolin, plus metronidazole   IDDM with Hypoglycemia = would either decrease his basal insulin. Will allow to eat  Hyponatremia = improved. Likely had hypovolemia hyponatremia now corrected with IVF and bolluses  Discussed plan with dr hall   [facility] for Infectious Diseases Cell: [phone] Pager: [phone]  09/02/2018, 3:34 PM

## 2018-09-03 LAB — CBC WITH DIFFERENTIAL/PLATELET
Abs Immature Granulocytes: 0.16 10*3/uL — ABNORMAL HIGH (ref 0.00–0.07)
Basophils Absolute: 0.1 10*3/uL (ref 0.0–0.1)
Basophils Relative: 0 %
EOS PCT: 1 %
Eosinophils Absolute: 0.2 10*3/uL (ref 0.0–0.5)
HCT: 45.1 % (ref 39.0–52.0)
Hemoglobin: 14.6 g/dL (ref 13.0–17.0)
Immature Granulocytes: 1 %
Lymphocytes Relative: 12 %
Lymphs Abs: 1.5 10*3/uL (ref 0.7–4.0)
MCH: 30.2 pg (ref 26.0–34.0)
MCHC: 32.4 g/dL (ref 30.0–36.0)
MCV: 93.4 fL (ref 80.0–100.0)
Monocytes Absolute: 1.3 10*3/uL — ABNORMAL HIGH (ref 0.1–1.0)
Monocytes Relative: 10 %
Neutro Abs: 10.2 10*3/uL — ABNORMAL HIGH (ref 1.7–7.7)
Neutrophils Relative %: 76 %
Platelets: 180 10*3/uL (ref 150–400)
RBC: 4.83 MIL/uL (ref 4.22–5.81)
RDW: 14.8 % (ref 11.5–15.5)
WBC: 13.3 10*3/uL — ABNORMAL HIGH (ref 4.0–10.5)
nRBC: 0 % (ref 0.0–0.2)

## 2018-09-03 LAB — COMPREHENSIVE METABOLIC PANEL
ALT: 38 U/L (ref 0–44)
ANION GAP: 8 (ref 5–15)
AST: 35 U/L (ref 15–41)
Albumin: 2.7 g/dL — ABNORMAL LOW (ref 3.5–5.0)
Alkaline Phosphatase: 52 U/L (ref 38–126)
BUN: 14 mg/dL (ref 8–23)
CO2: 22 mmol/L (ref 22–32)
Calcium: 8.1 mg/dL — ABNORMAL LOW (ref 8.9–10.3)
Chloride: 107 mmol/L (ref 98–111)
Creatinine, Ser: 0.95 mg/dL (ref 0.61–1.24)
Glucose, Bld: 115 mg/dL — ABNORMAL HIGH (ref 70–99)
Potassium: 3.5 mmol/L (ref 3.5–5.1)
Sodium: 137 mmol/L (ref 135–145)
Total Bilirubin: 0.9 mg/dL (ref 0.3–1.2)
Total Protein: 5.5 g/dL — ABNORMAL LOW (ref 6.5–8.1)

## 2018-09-03 LAB — GLUCOSE, CAPILLARY
Glucose-Capillary: 129 mg/dL — ABNORMAL HIGH (ref 70–99)
Glucose-Capillary: 133 mg/dL — ABNORMAL HIGH (ref 70–99)
Glucose-Capillary: 90 mg/dL (ref 70–99)
Glucose-Capillary: 111 mg/dL — ABNORMAL HIGH (ref 70–99)

## 2018-09-03 LAB — CULTURE, BLOOD (ROUTINE X 2): Special Requests: ADEQUATE

## 2018-09-03 MED ORDER — LOPERAMIDE HCL 2 MG PO CAPS: 2.0000 mg | ORAL_CAPSULE | ORAL | Status: DC | PRN

## 2018-09-03 MED ORDER — TRAZODONE HCL 50 MG PO TABS
50.0000 mg | ORAL_TABLET | Freq: Every evening | ORAL | Status: DC | PRN
  Administered 2018-09-03 – 2018-09-07 (×4): 50 mg via ORAL
  Filled 2018-09-03 (×4): qty 1

## 2018-09-03 MED ORDER — ENOXAPARIN SODIUM 40 MG/0.4ML ~~LOC~~ SOLN
40.0000 mg | SUBCUTANEOUS | Status: DC
  Administered 2018-09-03 – 2018-09-06 (×4): 40 mg via SUBCUTANEOUS
  Filled 2018-09-03 (×4): qty 0.4

## 2018-09-03 MED ORDER — LOPERAMIDE HCL 2 MG PO CAPS
2.0000 mg | ORAL_CAPSULE | Freq: Once | ORAL | Status: AC
  Administered 2018-09-03: 2 mg via ORAL
  Filled 2018-09-03: qty 1

## 2018-09-03 NOTE — Progress Notes (Signed)
PROGRESS NOTE  Michael Hines DVV:[id number] DOB: 1946/05/04 DOA: 09/01/2018 PCP: Finis Bud, MD  HPI/Recap of past 24 hours: Michael Hines is a 73 y.o. male with medical history significant of anxiety, B12 deficiency, back pain, type 2 diabetes, hyperlipidemia, testosterone insufficiency, aortic insufficiency with a history of aortic valve replacement who is coming to the emergency department with altered mental status and fever 3 days after receiving the influenza vaccine. TRH asked to admit for sepsis due to undetermined organism.  Infectious disease consulted and following.  09/02/2018: Patient seen and examined at his bedside.  Continues to have diarrhea.  GI panel negative.  C. difficile PCR pending.  09/03/18: Examined at bedside.  More alert today.  Denies chest pain, palpitations, dyspnea or abdominal pain.  No new complaints.   Assessment/Plan: Principal Problem:   Sepsis due to undetermined organism [facility]) Active Problems:   Diabetes mellitus without complication (HCC)   Hyponatremia   Polycythemia   Abnormal LFTs   Hypophosphatemia   Altered mental status   Sepsis secondary to gram-positive cocci bacteremia versus others Presented with fever with T-max of 104.4, tachycardia with heart rate of 116, tachypnea with respiration rate of 30, hypotension requiring boluses of IV fluids to maintain a map greater than 65 Blood cultures drawn on 09/01/2018 revealed gram-positive cocci Currently on broad-spectrum empiric antibiotics Infectious disease also following Continue antibiotics as recommended by infectious disease: Cefazolin, and IV Flagyl Continue to monitor fever curve and WBC Obtain CBC in the morning Repeat blood cultures x2 peripherally Hepatitis A, B, and C screening negative Influenza AMB negative CSF Gram stain negative  Acute metabolic encephalopathy suspect secondary to above More alert Appears to be improving Continue IV fluid Continue IV  antibiotics Continue supportive care  Diarrhea, improving GI panel and cdiff pcr negative  Intermittent sinus bradycardia Asymptomatic Continue to closely monitor If no improvement, we will consult cardiology in the morning  Hypotension Maintain map greater than 65 Continue IV fluid Continue to closely monitor vital signs  Hypokalemia, resolving Post repletion  Type 2 diabetes On glipizide at home Obtain A1c Continue insulin sliding scale   DVT prophylaxis:  Subcu Lovenox daily Code Status: Full code. Family Communication:  None at bedside Disposition Plan:  Home when clinically stable Consults called: Infectious diseases Karolee Ohs, MD).  Objective: Vitals:   09/03/18 1100 09/03/18 1200 09/03/18 1236 09/03/18 1434  BP: 114/62  (!) 95/45 (!) 101/47  Pulse: 74 69 (!) 46 (!) 50  Resp: 18 20 16 14   Temp:      TempSrc:      SpO2: 95% 95% 91% 96%  Weight:      Height:        Intake/Output Summary (Last 24 hours) at 09/03/2018 1509 Last data filed at 09/03/2018 1300 Gross per 24 hour  Intake 3723.41 ml  Output 1500 ml  Net 2223.41 ml   Filed Weights   09/01/18 0800 09/03/18 0500  Weight: 94.1 kg 98.3 kg    Exam:  . General: 73 y.o. year-old male well-developed well-nourished in no acute distress.  Somnolent but easily arousable to voices. . Cardiovascular: Regular rate and rhythm with no rubs or gallops.  No JVD or thyromegaly noted. Marland Kitchen Respiratory: Clear to auscultation with no wheezes or rales.  Poor inspiratory effort. . Abdomen: Soft nontender nondistended with normal bowel sounds x4 quadrants. . Musculoskeletal: No lower extremity edema. 2/4 pulses in all 4 extremities. Marland Kitchen Psychiatry: Unable to assess mood due to confusion  Data Reviewed: CBC: Recent Labs  Lab 09/01/18 0501 09/02/18 0531 09/03/18 0259  WBC 15.2* 14.9* 13.3*  NEUTROABS 14.0* 11.6* 10.2*  HGB 17.6* 14.0 14.6  HCT 52.2* 43.6 45.1  MCV 89.2 94.6 93.4  PLT 158 142* 599    Basic Metabolic Panel: Recent Labs  Lab 09/01/18 0501 09/01/18 1129 09/02/18 0531 09/03/18 0259  NA 131* 136 137 137  K 3.8 3.2* 3.9 3.5  CL 99 106 111 107  CO2 19* 23 20* 22  GLUCOSE 176* 152* 89 115*  BUN 25* 22 19 14   CREATININE 0.98 0.98 0.85 0.95  CALCIUM 8.6* 7.2* 7.5* 8.1*  MG 2.2  --   --   --   PHOS 2.2*  --   --   --    GFR: Estimated Creatinine Clearance: 83.6 mL/min (by C-G formula based on SCr of 0.95 mg/dL). Liver Function Tests: Recent Labs  Lab 09/01/18 0501 09/02/18 0531 09/03/18 0259  AST 46* 36 35  ALT 57* 41 38  ALKPHOS 72 46 52  BILITOT 2.0* 1.3* 0.9  PROT 7.0 4.9* 5.5*  ALBUMIN 3.4* 2.4* 2.7*   No results for input(s): LIPASE, AMYLASE in the last 168 hours. No results for input(s): AMMONIA in the last 168 hours. Coagulation Profile: Recent Labs  Lab 09/01/18 0501  INR 1.08   Cardiac Enzymes: Recent Labs  Lab 09/01/18 1129 09/01/18 1647  TROPONINI 0.03* 0.03*   BNP (last 3 results) No results for input(s): PROBNP in the last 8760 hours. HbA1C: No results for input(s): HGBA1C in the last 72 hours. CBG: Recent Labs  Lab 09/01/18 1417 09/01/18 2218 09/02/18 2157 09/03/18 0746 09/03/18 1217  GLUCAP 125* 95 145* 129* 133*   Lipid Profile: No results for input(s): CHOL, HDL, LDLCALC, TRIG, CHOLHDL, LDLDIRECT in the last 72 hours. Thyroid Function Tests: No results for input(s): TSH, T4TOTAL, FREET4, T3FREE, THYROIDAB in the last 72 hours. Anemia Panel: No results for input(s): VITAMINB12, FOLATE, FERRITIN, TIBC, IRON, RETICCTPCT in the last 72 hours. Urine analysis:    Component Value Date/Time   COLORURINE YELLOW 09/01/2018 [place] 09/01/2018 0514   LABSPEC 1.025 09/01/2018 0514   PHURINE 5.0 09/01/2018 0514   GLUCOSEU NEGATIVE 09/01/2018 0514   HGBUR MODERATE (A) 09/01/2018 0514   BILIRUBINUR NEGATIVE 09/01/2018 0514   KETONESUR 20 (A) 09/01/2018 0514   PROTEINUR 100 (A) 09/01/2018 0514   NITRITE  NEGATIVE 09/01/2018 0514   LEUKOCYTESUR NEGATIVE 09/01/2018 0514   Sepsis Labs: @LABRCNTIP (procalcitonin:4,lacticidven:4)  ) Recent Results (from the past 240 hour(s))  Culture, blood (Routine x 2)     Status: Abnormal   Collection Time: 09/01/18  5:03 AM  Result Value Ref Range Status   Specimen Description   Final    BLOOD BLOOD LEFT FOREARM Performed at [facility], [place] [address] [place], [place] [address]    Special Requests   Final    BOTTLES DRAWN AEROBIC AND ANAEROBIC Blood Culture adequate volume Performed at [place] [address], [place], [place] [address]    Culture  Setup Time   Final    GRAM POSITIVE COCCI AEROBIC BOTTLE ONLY CRITICAL RESULT CALLED TO, READ BACK BY AND VERIFIED WITH: PHARMD M RENZ [id number] MLM    Culture (A)  Final    STAPHYLOCOCCUS SPECIES (COAGULASE NEGATIVE) THE SIGNIFICANCE OF ISOLATING THIS ORGANISM FROM A SINGLE SET OF BLOOD CULTURES WHEN MULTIPLE SETS ARE DRAWN IS UNCERTAIN. PLEASE NOTIFY THE MICROBIOLOGY DEPARTMENT WITHIN ONE WEEK  IF SPECIATION AND SENSITIVITIES ARE REQUIRED. Performed at [facility], [place] [address] [place], [place] [address]    Report Status 09/03/2018 FINAL  Final  Culture, blood (Routine x 2)     Status: None (Preliminary result)   Collection Time: 09/01/18  5:03 AM  Result Value Ref Range Status   Specimen Description   Final    BLOOD BLOOD RIGHT FOREARM Performed at [place] [address], [place], [place] [address]    Special Requests   Final    BOTTLES DRAWN AEROBIC AND ANAEROBIC Blood Culture adequate volume Performed at [facility] [address] [place], [place] [address]    Culture   Final    NO GROWTH 2 DAYS Performed at [facility] [address], [place], [place] [address]    Report Status PENDING  Incomplete  Blood Culture ID Panel (Reflexed)     Status: Abnormal   Collection Time:  09/01/18  5:03 AM  Result Value Ref Range Status   Enterococcus species NOT DETECTED NOT DETECTED Final   Listeria monocytogenes NOT DETECTED NOT DETECTED Final   Staphylococcus species DETECTED (A) NOT DETECTED Final    Comment: Methicillin (oxacillin) susceptible coagulase negative staphylococcus. Possible blood culture contaminant (unless isolated from more than one blood culture draw or clinical case suggests pathogenicity). No antibiotic treatment is indicated for blood  culture contaminants. CRITICAL RESULT CALLED TO, READ BACK BY AND VERIFIED WITH: Minette Brine [id number] MLM    Staphylococcus aureus (BCID) NOT DETECTED NOT DETECTED Final   Methicillin resistance NOT DETECTED NOT DETECTED Final   Streptococcus species NOT DETECTED NOT DETECTED Final   Streptococcus agalactiae NOT DETECTED NOT DETECTED Final   Streptococcus pneumoniae NOT DETECTED NOT DETECTED Final   Streptococcus pyogenes NOT DETECTED NOT DETECTED Final   Acinetobacter baumannii NOT DETECTED NOT DETECTED Final   Enterobacteriaceae species NOT DETECTED NOT DETECTED Final   Enterobacter cloacae complex NOT DETECTED NOT DETECTED Final   Escherichia coli NOT DETECTED NOT DETECTED Final   Klebsiella oxytoca NOT DETECTED NOT DETECTED Final   Klebsiella pneumoniae NOT DETECTED NOT DETECTED Final   Proteus species NOT DETECTED NOT DETECTED Final   Serratia marcescens NOT DETECTED NOT DETECTED Final   Haemophilus influenzae NOT DETECTED NOT DETECTED Final   Neisseria meningitidis NOT DETECTED NOT DETECTED Final   Pseudomonas aeruginosa NOT DETECTED NOT DETECTED Final   Candida albicans NOT DETECTED NOT DETECTED Final   Candida glabrata NOT DETECTED NOT DETECTED Final   Candida krusei NOT DETECTED NOT DETECTED Final   Candida parapsilosis NOT DETECTED NOT DETECTED Final   Candida tropicalis NOT DETECTED NOT DETECTED Final    Comment: Performed at [facility], [address] [place], [place] [address]  Urine  culture     Status: None   Collection Time: 09/01/18  5:07 AM  Result Value Ref Range Status   Specimen Description   Final    URINE, CATHETERIZED Performed at [facility] [address], [place], [place] [address]    Special Requests   Final    NONE Performed at [facility] [place] [address] [place],  [address]    Culture   Final    NO GROWTH Performed at [facility], [place] [address], [place],  [address]    Report Status 09/02/2018 FINAL  Final  CSF culture     Status: None (Preliminary result)   Collection Time:  09/01/18  6:43 AM  Result Value Ref Range Status   Specimen Description   Final    CSF Performed at [place] [address], [place], [place] [address]    Special Requests   Final    NONE Performed at [facility], [place] [address] [place], [place] [address]    Gram Stain   Final    WBC PRESENT, PREDOMINANTLY MONONUCLEAR NO ORGANISMS SEEN CYTOSPIN SMEAR Gram Stain Report Called to,Read Back By and Verified With: SMITH,T. RN @0738  ON 1.22.2020 BY [email or web address]    Culture   Final    NO GROWTH 2 DAYS Performed at [facility], [place] [address], [place], [place] [address]    Report Status PENDING  Incomplete  MRSA PCR Screening     Status: None   Collection Time: 09/01/18  7:55 AM  Result Value Ref Range Status   MRSA by PCR NEGATIVE NEGATIVE Final    Comment:        The GeneXpert MRSA Assay (FDA approved for NASAL specimens only), is one component of a comprehensive MRSA colonization surveillance program. It is not intended to diagnose MRSA infection nor to guide or monitor treatment for MRSA infections. Performed at [facility], [place] [address] [place], [place] [address]   Gastrointestinal Panel by PCR , Stool     Status: None   Collection Time: 09/02/18  8:54 AM  Result Value Ref Range Status   Campylobacter species NOT DETECTED NOT  DETECTED Final   Plesimonas shigelloides NOT DETECTED NOT DETECTED Final   Salmonella species NOT DETECTED NOT DETECTED Final   Yersinia enterocolitica NOT DETECTED NOT DETECTED Final   Vibrio species NOT DETECTED NOT DETECTED Final   Vibrio cholerae NOT DETECTED NOT DETECTED Final   Enteroaggregative E coli (EAEC) NOT DETECTED NOT DETECTED Final   Enteropathogenic E coli (EPEC) NOT DETECTED NOT DETECTED Final   Enterotoxigenic E coli (ETEC) NOT DETECTED NOT DETECTED Final   Shiga like toxin producing E coli (STEC) NOT DETECTED NOT DETECTED Final   Shigella/Enteroinvasive E coli (EIEC) NOT DETECTED NOT DETECTED Final   Cryptosporidium NOT DETECTED NOT DETECTED Final   Cyclospora cayetanensis NOT DETECTED NOT DETECTED Final   Entamoeba histolytica NOT DETECTED NOT DETECTED Final   Giardia lamblia NOT DETECTED NOT DETECTED Final   Adenovirus F40/41 NOT DETECTED NOT DETECTED Final   Astrovirus NOT DETECTED NOT DETECTED Final   Norovirus GI/GII NOT DETECTED NOT DETECTED Final   Rotavirus A NOT DETECTED NOT DETECTED Final   Sapovirus (I, II, IV, and V) NOT DETECTED NOT DETECTED Final    Comment: Performed at [facility], [place] [place], [place] [address]  C difficile quick scan w PCR reflex     Status: None   Collection Time: 09/02/18  3:32 PM  Result Value Ref Range Status   C Diff antigen NEGATIVE NEGATIVE Final   C Diff toxin NEGATIVE NEGATIVE Final   C Diff interpretation No C. difficile detected.  Final    Comment: Performed at [facility], [place] [address], [place], [place] [address]      Studies: No results found.  Scheduled Meds: . insulin aspart  0-5 Units Subcutaneous QHS  . insulin aspart  0-9 Units Subcutaneous TID WC  . mouth rinse  15 mL Mouth Rinse BID    Continuous Infusions: . sodium chloride 10 mL/hr at 09/02/18 0313  .  ceFAZolin (ANCEF) IV 2 g (09/03/18 1434)  .  dextrose 5 % and 0.45% NaCl 75 mL/hr at 09/03/18 1300  .  metronidazole Stopped (09/03/18 0900)     LOS: 2 days     Kayleen Memos, MD [facility] Pager [phone]  If 7PM-7AM, please contact night-coverage [email or web address] Password [facility] 09/03/2018, 3:09 PM

## 2018-09-03 NOTE — Progress Notes (Signed)
    [facility] for Infectious Disease    Date of Admission:  09/01/2018   Total days of antibiotics 4           ID: Michael Hines is a 73 y.o. male with  Sepsis of unclear origin Principal Problem:   Sepsis due to undetermined organism [facility]) Active Problems:   Diabetes mellitus without complication ([place])   Hyponatremia   Polycythemia   Abnormal LFTs   Hypophosphatemia   Altered mental status    Subjective: Feeling better still having loose stools " when will my diarrhea stop".  He reports history of low BP, and stopped taking beta blockers roughly 6- 12 months ago.   Medications:  . enoxaparin (LOVENOX) injection  40 mg Subcutaneous Q24H  . insulin aspart  0-5 Units Subcutaneous QHS  . insulin aspart  0-9 Units Subcutaneous TID WC  . mouth rinse  15 mL Mouth Rinse BID    Objective: Vital signs in last 24 hours: Temp:  [97.7 F (36.5 C)-98.6 F (37 C)] 97.8 F (36.6 C) (01/24 0800) Pulse Rate:  [43-84] 44 (01/24 1800) Resp:  [14-29] 14 (01/24 1800) BP: (94-132)/(38-64) 132/55 (01/24 1800) SpO2:  [90 %-98 %] 98 % (01/24 1800) Weight:  [98.3 kg] 98.3 kg (01/24 0500) Physical Exam  Constitutional: He is oriented to person, place, and time. He appears well-developed and well-nourished. No distress.  HENT:  Mouth/Throat: Oropharynx is clear and moist. No oropharyngeal exudate.  Cardiovascular: Normal rate, regular rhythm and normal heart sounds. + murmur Pulmonary/Chest: Effort normal and breath sounds normal. No respiratory distress. He has no wheezes.  Abdominal: Soft. Bowel sounds are normal. He exhibits no distension. There is no tenderness.  Lymphadenopathy:  He has no cervical adenopathy.  Neurological: He is alert and oriented to person, place, and time.  Skin: Skin is warm and dry. No rash noted. No erythema.  Psychiatric: He has a normal mood and affect. His behavior is normal.    Lab Results Recent Labs    09/02/18 0531 09/03/18 0259  WBC 14.9*  13.3*  HGB 14.0 14.6  HCT 43.6 45.1  NA 137 137  K 3.9 3.5  CL 111 107  CO2 20* 22  BUN 19 14  CREATININE 0.85 0.95   Liver Panel Recent Labs    09/02/18 0531 09/03/18 0259  PROT 4.9* 5.5*  ALBUMIN 2.4* 2.7*  AST 36 35  ALT 41 38  ALKPHOS 46 52  BILITOT 1.3* 0.9    Microbiology: Reviewed, blood cx 1/22 +  Studies/Results: No results found.   Assessment/Plan: CoNS bacteremia = recommend repeat blood cx today to see that he has cleared his bacteremia. Will narrow abtx to cefazolin. Plan to get TEE. Cause of overall sepsis still somewhat unknown, it sounds like he had viral/flu-like picture but having bacteremia in the setting of having Aortic valve likely requires rule out of endocarditis  Diarrhea= ruled out infection, will start imodium   Bradycardia = appears to be chronic per his report. Will check for adrenal insufficiency Hypotension  Leukocytosis = slowly improving.   Spoke to dr hall about recommendations [facility] for Infectious Diseases Cell: [phone] Pager: [phone]  09/03/2018, 6:07 PM

## 2018-09-04 LAB — GLUCOSE, CAPILLARY
GLUCOSE-CAPILLARY: 128 mg/dL — AB (ref 70–99)
Glucose-Capillary: 138 mg/dL — ABNORMAL HIGH (ref 70–99)
Glucose-Capillary: 110 mg/dL — ABNORMAL HIGH (ref 70–99)
Glucose-Capillary: 153 mg/dL — ABNORMAL HIGH (ref 70–99)

## 2018-09-04 LAB — CBC WITH DIFFERENTIAL/PLATELET
Abs Immature Granulocytes: 0.18 10*3/uL — ABNORMAL HIGH (ref 0.00–0.07)
BASOS ABS: 0.1 10*3/uL (ref 0.0–0.1)
Basophils Relative: 1 %
EOS PCT: 2 %
Eosinophils Absolute: 0.2 10*3/uL (ref 0.0–0.5)
HCT: 42.1 % (ref 39.0–52.0)
Hemoglobin: 13.6 g/dL (ref 13.0–17.0)
Immature Granulocytes: 2 %
Lymphocytes Relative: 18 %
Lymphs Abs: 1.8 10*3/uL (ref 0.7–4.0)
MCH: 30 pg (ref 26.0–34.0)
MCHC: 32.3 g/dL (ref 30.0–36.0)
MCV: 92.9 fL (ref 80.0–100.0)
Monocytes Absolute: 1.1 10*3/uL — ABNORMAL HIGH (ref 0.1–1.0)
Monocytes Relative: 10 %
Neutro Abs: 7 10*3/uL (ref 1.7–7.7)
Neutrophils Relative %: 67 %
Platelets: 179 10*3/uL (ref 150–400)
RBC: 4.53 MIL/uL (ref 4.22–5.81)
RDW: 14.6 % (ref 11.5–15.5)
WBC: 10.3 10*3/uL (ref 4.0–10.5)
nRBC: 0 % (ref 0.0–0.2)

## 2018-09-04 LAB — HEMOGLOBIN A1C
Hgb A1c MFr Bld: 7.4 % — ABNORMAL HIGH (ref 4.8–5.6)
Mean Plasma Glucose: 165.68 mg/dL

## 2018-09-04 LAB — CSF CULTURE W GRAM STAIN: Culture: NO GROWTH

## 2018-09-04 LAB — BASIC METABOLIC PANEL
Anion gap: 9 (ref 5–15)
BUN: 9 mg/dL (ref 8–23)
CHLORIDE: 107 mmol/L (ref 98–111)
CO2: 21 mmol/L — AB (ref 22–32)
Calcium: 8 mg/dL — ABNORMAL LOW (ref 8.9–10.3)
Creatinine, Ser: 0.88 mg/dL (ref 0.61–1.24)
GFR calc Af Amer: >60 mL/min (ref >60)
GFR calc non Af Amer: >60 mL/min (ref >60)
GLUCOSE: 117 mg/dL — AB (ref 70–99)
Potassium: 3.1 mmol/L — ABNORMAL LOW (ref 3.5–5.1)
Sodium: 137 mmol/L (ref 135–145)

## 2018-09-04 LAB — CREATININE, SERUM
Creatinine, Ser: 0.93 mg/dL (ref 0.61–1.24)
GFR calc Af Amer: >60 mL/min (ref >60)
GFR calc non Af Amer: >60 mL/min (ref >60)

## 2018-09-04 LAB — CORTISOL-AM, BLOOD: Cortisol - AM: 6.4 ug/dL — ABNORMAL LOW (ref 6.7–22.6)

## 2018-09-04 MED ORDER — POTASSIUM CHLORIDE CRYS ER 20 MEQ PO TBCR
40.0000 meq | EXTENDED_RELEASE_TABLET | Freq: Two times a day (BID) | ORAL | Status: AC
  Administered 2018-09-04 (×2): 40 meq via ORAL
  Filled 2018-09-04 (×2): qty 2

## 2018-09-04 MED ORDER — LIP MEDEX EX OINT
TOPICAL_OINTMENT | CUTANEOUS | Status: AC
  Filled 2018-09-04: qty 7

## 2018-09-04 NOTE — Progress Notes (Signed)
PROGRESS NOTE  Michael Hines:[id number] DOB: May 05, 1946 DOA: 09/01/2018 PCP: Finis Bud, MD  HPI/Recap of past 24 hours: Michael Hines is a 73 y.o. male with medical history significant of anxiety, B12 deficiency, back pain, type 2 diabetes, hyperlipidemia, testosterone insufficiency, aortic insufficiency with a history of aortic valve replacement who is coming to the emergency department with altered mental status and fever 3 days after receiving the influenza vaccine. TRH asked to admit for sepsis due to undetermined organism.  Infectious disease consulted and following.  09/02/2018: Continues to have diarrhea.  GI panel negative.  C. difficile PCR negative. 09/03/18: Examined at bedside.  More alert today.    09/04/2018: Patient seen and examined at his bedside.  He is alert and oriented x3.  Reports persistent diarrhea with no abdominal pain or nausea.  He wants to go home.  No TEE service over the weekend per cardiology.  Will contact [name] on Monday to arrange for TEE.  Cortisol level ordered and pending. Hypotension has resolved.  Assessment/Plan: Principal Problem:   Sepsis due to undetermined organism [facility]) Active Problems:   Diabetes mellitus without complication (HCC)   Hyponatremia   Polycythemia   Abnormal LFTs   Hypophosphatemia   Altered mental status   Sepsis secondary to gram-positive cocci bacteremia versus others Presented with fever with T-max of 104.4, tachycardia with heart rate of 116, tachypnea with respiration rate of 30, hypotension requiring boluses of IV fluids to maintain a map greater than 65 Blood cultures drawn on 09/01/2018 revealed gram-positive cocci Currently on broad-spectrum empiric antibiotics cefazolin Infectious disease following Continue antibiotics as recommended by infectious disease: Cefazolin Continue to monitor fever curve and WBC Obtain CBC in the morning Repeat blood cultures x2 peripherally done on 09/03/2018 in  process Hepatitis A, B, and C screening negative Influenza A&B negative CSF Gram stain negative Sepsis physiology is resolving Leukocytosis has resolved We will call [name] on Monday to arrange for TEE.  No TEE service over the weekend  Hypotension Ordered random cortisol level to rule out adrenal insufficiency, pending Hypotension resolving  Chronic bradycardia Being followed by cardiology outpatient for his chronic sinus bradycardia Avoid beta-blockers and calcium channel blocker  Resolved acute metabolic encephalopathy suspect secondary to above More alert and oriented x3 Continue IV fluid Continue IV antibiotics Continue supportive care  Diarrhea, improving GI panel and cdiff pcr negative Continue Imodium as needed  Hypotension Maintain map greater than 65 Continue IV fluid Continue to closely monitor vital signs  Hypokalemia Repleted Repeat BMP in the morning  Type 2 diabetes On glipizide at home Obtain A1c Continue insulin sliding scale   DVT prophylaxis:  Subcu Lovenox daily Code Status: Full code. Family Communication:  None at bedside Disposition Plan:  Home when clinically stable Consults called: Infectious diseases Karolee Ohs, MD).  Objective: Vitals:   09/04/18 0200 09/04/18 0400 09/04/18 0733 09/04/18 0816  BP:    (!) 102/55  Pulse: (!) 48   (!) 48  Resp: (!) 25   12  Temp:  99.4 F (37.4 C) 98.3 F (36.8 C)   TempSrc:  Axillary Oral   SpO2: 93%   94%  Weight:      Height:        Intake/Output Summary (Last 24 hours) at 09/04/2018 1150 Last data filed at 09/04/2018 0832 Gross per 24 hour  Intake 1796.73 ml  Output 950 ml  Net 846.73 ml   Filed Weights   09/01/18 0800 09/03/18 0500  Weight: 94.1  kg 98.3 kg    Exam:  . General: 73 y.o. year-old male well-developed well-nourished in no acute distress.  Alert and oriented x3.  Cardiovascular: Regular rate and rhythm with no rubs or gallops.  No JVD or thyromegaly  noted. Marland Kitchen Respiratory: Clear to auscultation with no wheezes or rales.  Good inspiratory effort. . Abdomen: Soft nontender nondistended with hyperactive bowel sounds x4 quadrants. . Musculoskeletal: No lower extremity edema. 2/4 pulses in all 4 extremities. Marland Kitchen Psychiatry: Mood is appropriate for condition and setting.   Data Reviewed: CBC: Recent Labs  Lab 09/01/18 0501 09/02/18 0531 09/03/18 0259 09/04/18 0249  WBC 15.2* 14.9* 13.3* 10.3  NEUTROABS 14.0* 11.6* 10.2* 7.0  HGB 17.6* 14.0 14.6 13.6  HCT 52.2* 43.6 45.1 42.1  MCV 89.2 94.6 93.4 92.9  PLT 158 142* 180 176   Basic Metabolic Panel: Recent Labs  Lab 09/01/18 0501 09/01/18 1129 09/02/18 0531 09/03/18 0259 09/04/18 0249  NA 131* 136 137 137 137  K 3.8 3.2* 3.9 3.5 3.1*  CL 99 106 111 107 107  CO2 19* 23 20* 22 21*  GLUCOSE 176* 152* 89 115* 117*  BUN 25* 22 19 14 9   CREATININE 0.98 0.98 0.85 0.95 0.88  0.93  CALCIUM 8.6* 7.2* 7.5* 8.1* 8.0*  MG 2.2  --   --   --   --   PHOS 2.2*  --   --   --   --    GFR: Estimated Creatinine Clearance: 85.4 mL/min (by C-G formula based on SCr of 0.93 mg/dL). Liver Function Tests: Recent Labs  Lab 09/01/18 0501 09/02/18 0531 09/03/18 0259  AST 46* 36 35  ALT 57* 41 38  ALKPHOS 72 46 52  BILITOT 2.0* 1.3* 0.9  PROT 7.0 4.9* 5.5*  ALBUMIN 3.4* 2.4* 2.7*   No results for input(s): LIPASE, AMYLASE in the last 168 hours. No results for input(s): AMMONIA in the last 168 hours. Coagulation Profile: Recent Labs  Lab 09/01/18 0501  INR 1.08   Cardiac Enzymes: Recent Labs  Lab 09/01/18 1129 09/01/18 1647  TROPONINI 0.03* 0.03*   BNP (last 3 results) No results for input(s): PROBNP in the last 8760 hours. HbA1C: No results for input(s): HGBA1C in the last 72 hours. CBG: Recent Labs  Lab 09/03/18 0746 09/03/18 1217 09/03/18 1653 09/03/18 2212 09/04/18 0816  GLUCAP 129* 133* 90 111* 138*   Lipid Profile: No results for input(s): CHOL, HDL, LDLCALC, TRIG,  CHOLHDL, LDLDIRECT in the last 72 hours. Thyroid Function Tests: No results for input(s): TSH, T4TOTAL, FREET4, T3FREE, THYROIDAB in the last 72 hours. Anemia Panel: No results for input(s): VITAMINB12, FOLATE, FERRITIN, TIBC, IRON, RETICCTPCT in the last 72 hours. Urine analysis:    Component Value Date/Time   COLORURINE YELLOW 09/01/2018 [place] 09/01/2018 0514   LABSPEC 1.025 09/01/2018 0514   PHURINE 5.0 09/01/2018 0514   GLUCOSEU NEGATIVE 09/01/2018 0514   HGBUR MODERATE (A) 09/01/2018 0514   BILIRUBINUR NEGATIVE 09/01/2018 0514   KETONESUR 20 (A) 09/01/2018 0514   PROTEINUR 100 (A) 09/01/2018 0514   NITRITE NEGATIVE 09/01/2018 0514   LEUKOCYTESUR NEGATIVE 09/01/2018 0514   Sepsis Labs: @LABRCNTIP (procalcitonin:4,lacticidven:4)  ) Recent Results (from the past 240 hour(s))  Culture, blood (Routine x 2)     Status: Abnormal   Collection Time: 09/01/18  5:03 AM  Result Value Ref Range Status   Specimen Description   Final    BLOOD BLOOD LEFT FOREARM Performed at [facility], [address] [place], [place] [address]    Special Requests   Final    BOTTLES DRAWN AEROBIC AND ANAEROBIC Blood Culture adequate volume Performed at [facility] [address] [place], [place] [address]    Culture  Setup Time   Final    GRAM POSITIVE COCCI AEROBIC BOTTLE ONLY CRITICAL RESULT CALLED TO, READ BACK BY AND VERIFIED WITH: PHARMD M RENZ [id number] MLM    Culture (A)  Final    STAPHYLOCOCCUS SPECIES (COAGULASE NEGATIVE) THE SIGNIFICANCE OF ISOLATING THIS ORGANISM FROM A SINGLE SET OF BLOOD CULTURES WHEN MULTIPLE SETS ARE DRAWN IS UNCERTAIN. PLEASE NOTIFY THE MICROBIOLOGY DEPARTMENT WITHIN ONE WEEK IF SPECIATION AND SENSITIVITIES ARE REQUIRED. Performed at [facility], [place] [address], [place], [place] [address]    Report Status 09/03/2018 FINAL  Final  Culture, blood (Routine x 2)     Status: None (Preliminary  result)   Collection Time: 09/01/18  5:03 AM  Result Value Ref Range Status   Specimen Description   Final    BLOOD BLOOD RIGHT FOREARM Performed at [facility] [address] [place], [place] [address]    Special Requests   Final    BOTTLES DRAWN AEROBIC AND ANAEROBIC Blood Culture adequate volume Performed at [facility] [address] [place], [place] [address]    Culture   Final    NO GROWTH 3 DAYS Performed at [facility], [place] [address], [place], [place] [address]    Report Status PENDING  Incomplete  Blood Culture ID Panel (Reflexed)     Status: Abnormal   Collection Time: 09/01/18  5:03 AM  Result Value Ref Range Status   Enterococcus species NOT DETECTED NOT DETECTED Final   Listeria monocytogenes NOT DETECTED NOT DETECTED Final   Staphylococcus species DETECTED (A) NOT DETECTED Final    Comment: Methicillin (oxacillin) susceptible coagulase negative staphylococcus. Possible blood culture contaminant (unless isolated from more than one blood culture draw or clinical case suggests pathogenicity). No antibiotic treatment is indicated for blood  culture contaminants. CRITICAL RESULT CALLED TO, READ BACK BY AND VERIFIED WITH: Minette Brine [id number] MLM    Staphylococcus aureus (BCID) NOT DETECTED NOT DETECTED Final   Methicillin resistance NOT DETECTED NOT DETECTED Final   Streptococcus species NOT DETECTED NOT DETECTED Final   Streptococcus agalactiae NOT DETECTED NOT DETECTED Final   Streptococcus pneumoniae NOT DETECTED NOT DETECTED Final   Streptococcus pyogenes NOT DETECTED NOT DETECTED Final   Acinetobacter baumannii NOT DETECTED NOT DETECTED Final   Enterobacteriaceae species NOT DETECTED NOT DETECTED Final   Enterobacter cloacae complex NOT DETECTED NOT DETECTED Final   Escherichia coli NOT DETECTED NOT DETECTED Final   Klebsiella oxytoca NOT DETECTED NOT DETECTED Final   Klebsiella pneumoniae NOT DETECTED NOT  DETECTED Final   Proteus species NOT DETECTED NOT DETECTED Final   Serratia marcescens NOT DETECTED NOT DETECTED Final   Haemophilus influenzae NOT DETECTED NOT DETECTED Final   Neisseria meningitidis NOT DETECTED NOT DETECTED Final   Pseudomonas aeruginosa NOT DETECTED NOT DETECTED Final   Candida albicans NOT DETECTED NOT DETECTED Final   Candida glabrata NOT DETECTED NOT DETECTED Final   Candida krusei NOT DETECTED NOT DETECTED Final   Candida parapsilosis NOT DETECTED NOT DETECTED Final   Candida tropicalis NOT DETECTED NOT DETECTED Final    Comment: Performed at [facility], [address] [place], [place] [address]  Urine culture  Status: None   Collection Time: 09/01/18  5:07 AM  Result Value Ref Range Status   Specimen Description   Final    URINE, CATHETERIZED Performed at [facility] [address] [place], [place] [address]    Special Requests   Final    NONE Performed at [facility], [place] [address] [place], [place] [address]    Culture   Final    NO GROWTH Performed at [facility], [place] [address], [place], [place] [address]    Report Status 09/02/2018 FINAL  Final  CSF culture     Status: None   Collection Time: 09/01/18  6:43 AM  Result Value Ref Range Status   Specimen Description   Final    CSF Performed at [facility] [address], [place], [place] [address]    Special Requests   Final    NONE Performed at [facility], [place] [address], [place], [place] [address]    Gram Stain   Final    WBC PRESENT, PREDOMINANTLY MONONUCLEAR NO ORGANISMS SEEN CYTOSPIN SMEAR Gram Stain Report Called to,Read Back By and Verified With: SMITH,T. RN @0738  ON 1.22.2020 BY [email or web address]    Culture   Final    NO GROWTH 3 DAYS Performed at [facility], [place] [address], [place], [place] [address]    Report Status 09/04/2018 FINAL  Final  MRSA PCR Screening     Status: None    Collection Time: 09/01/18  7:55 AM  Result Value Ref Range Status   MRSA by PCR NEGATIVE NEGATIVE Final    Comment:        The GeneXpert MRSA Assay (FDA approved for NASAL specimens only), is one component of a comprehensive MRSA colonization surveillance program. It is not intended to diagnose MRSA infection nor to guide or monitor treatment for MRSA infections. Performed at [facility], [place] [address], [place], [place] [address]   Gastrointestinal Panel by PCR , Stool     Status: None   Collection Time: 09/02/18  8:54 AM  Result Value Ref Range Status   Campylobacter species NOT DETECTED NOT DETECTED Final   Plesimonas shigelloides NOT DETECTED NOT DETECTED Final   Salmonella species NOT DETECTED NOT DETECTED Final   Yersinia enterocolitica NOT DETECTED NOT DETECTED Final   Vibrio species NOT DETECTED NOT DETECTED Final   Vibrio cholerae NOT DETECTED NOT DETECTED Final   Enteroaggregative E coli (EAEC) NOT DETECTED NOT DETECTED Final   Enteropathogenic E coli (EPEC) NOT DETECTED NOT DETECTED Final   Enterotoxigenic E coli (ETEC) NOT DETECTED NOT DETECTED Final   Shiga like toxin producing E coli (STEC) NOT DETECTED NOT DETECTED Final   Shigella/Enteroinvasive E coli (EIEC) NOT DETECTED NOT DETECTED Final   Cryptosporidium NOT DETECTED NOT DETECTED Final   Cyclospora cayetanensis NOT DETECTED NOT DETECTED Final   Entamoeba histolytica NOT DETECTED NOT DETECTED Final   Giardia lamblia NOT DETECTED NOT DETECTED Final   Adenovirus F40/41 NOT DETECTED NOT DETECTED Final   Astrovirus NOT DETECTED NOT DETECTED Final   Norovirus GI/GII NOT DETECTED NOT DETECTED Final   Rotavirus A NOT DETECTED NOT DETECTED Final   Sapovirus (I, II, IV, and V) NOT DETECTED NOT DETECTED Final    Comment: Performed at [facility], [place] [address] [place],  [address]  C difficile quick scan w PCR reflex     Status: None   Collection Time: 09/02/18  3:32 PM  Result  Value Ref Range Status   C Diff antigen NEGATIVE NEGATIVE Final   C Diff toxin NEGATIVE NEGATIVE Final   C Diff interpretation No C. difficile detected.  Final    Comment: Performed at [facility], [place] [address], [place], [place] [address]  Culture, blood (routine x 2)     Status: None (Preliminary result)   Collection Time: 09/03/18  3:24 PM  Result Value Ref Range Status   Specimen Description   Final    BLOOD LEFT HAND Performed at [facility] [address], [place], [place] [address]    Special Requests   Final    BOTTLES DRAWN AEROBIC ONLY Blood Culture adequate volume Performed at [facility] [address], [place], [place] [address]    Culture   Final    NO GROWTH < 24 HOURS Performed at [facility] [address], [place], [place] [address]    Report Status PENDING  Incomplete  Culture, blood (routine x 2)     Status: None (Preliminary result)   Collection Time: 09/03/18  3:29 PM  Result Value Ref Range Status   Specimen Description   Final    BLOOD RIGHT ANTECUBITAL Performed at [facility] [address] [place], [place] [address]    Special Requests   Final    BOTTLES DRAWN AEROBIC AND ANAEROBIC Blood Culture adequate volume Performed at [facility] [address], [place], [place] [address]    Culture   Final    NO GROWTH < 24 HOURS Performed at [facility] [address], [place], [place] [address]    Report Status PENDING  Incomplete      Studies: No results found.  Scheduled Meds: . enoxaparin (LOVENOX) injection  40 mg Subcutaneous Q24H  . insulin aspart  0-5 Units Subcutaneous QHS  . insulin aspart  0-9 Units Subcutaneous TID WC  . mouth rinse  15 mL Mouth Rinse BID    Continuous Infusions: . sodium chloride 10 mL/hr at 09/02/18 0313  .  ceFAZolin (ANCEF) IV Stopped (09/04/18 0631)  . dextrose 5 % and 0.45% NaCl 75 mL/hr at  09/04/18 0815     LOS: 3 days     Kayleen Memos, MD [facility] Pager [phone]  If 7PM-7AM, please contact night-coverage [email or web address] Password [facility] 09/04/2018, 11:50 AM

## 2018-09-04 NOTE — Progress Notes (Signed)
Report called and given to Shanon Brow, RN on 5th floor and pt transferred to 1515. Left unit in wheelchair pushed by nurse tech accompanied by oldest son. Left in steady condition.  VWilliams, Therapist, sports.

## 2018-09-05 LAB — CBC WITH DIFFERENTIAL/PLATELET
Abs Immature Granulocytes: 0.21 10*3/uL — ABNORMAL HIGH (ref 0.00–0.07)
Basophils Absolute: 0.1 10*3/uL (ref 0.0–0.1)
Basophils Relative: 1 %
Eosinophils Absolute: 0.2 10*3/uL (ref 0.0–0.5)
Eosinophils Relative: 2 %
HCT: 47.6 % (ref 39.0–52.0)
Hemoglobin: 15.7 g/dL (ref 13.0–17.0)
Immature Granulocytes: 2 %
Lymphocytes Relative: 13 %
Lymphs Abs: 1.7 10*3/uL (ref 0.7–4.0)
MCH: 30.3 pg (ref 26.0–34.0)
MCHC: 33 g/dL (ref 30.0–36.0)
MCV: 91.7 fL (ref 80.0–100.0)
Monocytes Absolute: 1 10*3/uL (ref 0.1–1.0)
Monocytes Relative: 8 %
Neutro Abs: 9.3 10*3/uL — ABNORMAL HIGH (ref 1.7–7.7)
Neutrophils Relative %: 74 %
Platelets: 229 10*3/uL (ref 150–400)
RBC: 5.19 MIL/uL (ref 4.22–5.81)
RDW: 14.5 % (ref 11.5–15.5)
WBC: 12.4 10*3/uL — ABNORMAL HIGH (ref 4.0–10.5)
nRBC: 0 % (ref 0.0–0.2)

## 2018-09-05 LAB — GLUCOSE, CAPILLARY
GLUCOSE-CAPILLARY: 126 mg/dL — AB (ref 70–99)
Glucose-Capillary: 175 mg/dL — ABNORMAL HIGH (ref 70–99)
Glucose-Capillary: 121 mg/dL — ABNORMAL HIGH (ref 70–99)
Glucose-Capillary: 125 mg/dL — ABNORMAL HIGH (ref 70–99)

## 2018-09-05 LAB — BASIC METABOLIC PANEL
Anion gap: 6 (ref 5–15)
BUN: 7 mg/dL — ABNORMAL LOW (ref 8–23)
CHLORIDE: 102 mmol/L (ref 98–111)
CO2: 28 mmol/L (ref 22–32)
Calcium: 8.3 mg/dL — ABNORMAL LOW (ref 8.9–10.3)
Creatinine, Ser: 0.91 mg/dL (ref 0.61–1.24)
GFR calc non Af Amer: >60 mL/min (ref >60)
Glucose, Bld: 175 mg/dL — ABNORMAL HIGH (ref 70–99)
Potassium: 3.4 mmol/L — ABNORMAL LOW (ref 3.5–5.1)
Sodium: 136 mmol/L (ref 135–145)

## 2018-09-05 MED ORDER — POTASSIUM CHLORIDE CRYS ER 20 MEQ PO TBCR
40.0000 meq | EXTENDED_RELEASE_TABLET | Freq: Once | ORAL | Status: AC
  Administered 2018-09-05: 40 meq via ORAL
  Filled 2018-09-05: qty 2

## 2018-09-05 MED ORDER — IPRATROPIUM-ALBUTEROL 0.5-2.5 (3) MG/3ML IN SOLN: 3.0000 mL | Freq: Four times a day (QID) | RESPIRATORY_TRACT | Status: DC | PRN

## 2018-09-05 MED ORDER — MOMETASONE FURO-FORMOTEROL FUM 100-5 MCG/ACT IN AERO
2.0000 | INHALATION_SPRAY | Freq: Two times a day (BID) | RESPIRATORY_TRACT | Status: DC
  Administered 2018-09-05 – 2018-09-06 (×2): 2 via RESPIRATORY_TRACT
  Filled 2018-09-05: qty 8.8

## 2018-09-05 NOTE — Progress Notes (Signed)
Low HR has been notified to hospitalist Bodenhiemer.

## 2018-09-05 NOTE — Progress Notes (Signed)
    [facility] for Infectious Disease    Date of Admission:  09/01/2018   Total days of antibiotics 6        Day 4 cefazolin   ID: Michael Hines is a 73 y.o. male with sepsis of unknown source. Found to have bacteremia Principal Problem:   Sepsis due to undetermined organism [facility]) Active Problems:   Diabetes mellitus without complication ([place])   Hyponatremia   Polycythemia   Abnormal LFTs   Hypophosphatemia   Altered mental status    Subjective: Afebrile. Looking forward to going home. Denies significant diarrhea  Medications:  . enoxaparin (LOVENOX) injection  40 mg Subcutaneous Q24H  . insulin aspart  0-5 Units Subcutaneous QHS  . insulin aspart  0-9 Units Subcutaneous TID WC  . mouth rinse  15 mL Mouth Rinse BID  . mometasone-formoterol  2 puff Inhalation BID    Objective: Vital signs in last 24 hours: Temp:  [97.7 F (36.5 C)-98.2 F (36.8 C)] 98.2 F (36.8 C) (01/26 1400) Pulse Rate:  [42-88] 78 (01/26 1400) Resp:  [16-20] 20 (01/26 1400) BP: (119-132)/(55-73) 132/69 (01/26 1400) SpO2:  [94 %-97 %] 97 % (01/26 1400) Physical Exam  Constitutional: He is oriented to person, place, and time. He appears well-developed and well-nourished. No distress.  HENT:  Mouth/Throat: Oropharynx is clear and moist. No oropharyngeal exudate.  Cardiovascular: Normal rate, regular rhythm and normal heart sounds. Exam reveals no gallop and no friction rub.  No murmur heard.  Pulmonary/Chest: Effort normal and breath sounds normal. No respiratory distress. He has no wheezes.  Abdominal: Soft. Bowel sounds are normal. He exhibits no distension. There is no tenderness.  Lymphadenopathy:  He has no cervical adenopathy.  Neurological: He is alert and oriented to person, place, and time.  Skin: Skin is warm and dry. No rash noted. No erythema.  Psychiatric: He has a normal mood and affect. His behavior is normal.     Lab Results Recent Labs    09/04/18 0249 09/05/18 1018   WBC 10.3 12.4*  HGB 13.6 15.7  HCT 42.1 47.6  NA 137 136  K 3.1* 3.4*  CL 107 102  CO2 21* 28  BUN 9 7*  CREATININE 0.88  0.93 0.91   Liver Panel Recent Labs    09/03/18 0259  PROT 5.5*  ALBUMIN 2.7*  AST 35  ALT 38  ALKPHOS 52  BILITOT 0.9    Microbiology: Blood cx 1/24 x  2 NGTD Blood cx 1/22  CoNS in 1 set Studies/Results: No results found.   Assessment/Plan: Sepsis of unknown source, found to have CoNS bacteremia in 1 set of blood cx from admit, given that he has AVR - recommend to get TEE to help decide course of treatment  - for now continue on cefazolin 2gm IV Q 8hr.  Leukocytosis = slight increase today's lab. Recommend to check tomorrows cbc  Diarrhea = infectious causes ruled out. Can use immodium prn  [facility] for Infectious Diseases Cell: [phone] Pager: [phone]  09/05/2018, 6:49 PM

## 2018-09-05 NOTE — Progress Notes (Addendum)
PROGRESS NOTE  Michael Hines NTI:[id number] DOB: 26-Nov-1945 DOA: 09/01/2018 PCP: Finis Bud, MD  HPI/Recap of past 24 hours: Michael Hines is a 73 y.o. male with medical history significant of anxiety, B12 deficiency, back pain, type 2 diabetes, hyperlipidemia, testosterone insufficiency, aortic insufficiency with a history of aortic valve replacement who is coming to the emergency department with altered mental status and fever 3 days after receiving the influenza vaccine. TRH asked to admit for sepsis due to undetermined organism.  Infectious disease consulted and following.  09/02/2018: Continues to have diarrhea.  GI panel negative.  C. difficile PCR negative. 09/03/18: Examined at bedside.  More alert today.   09/04/2018: Reports persistent diarrhea with no abdominal pain or nausea.  He wants to go home.  No TEE service over the weekend per cardiology.  Will contact [name] on Monday to arrange for TEE.  Cortisol level ordered and pending. Hypotension has resolved.  09/05/2018: Patient seen and examined at bedside.  No acute events overnight.  Intermittent sinus bradycardia noted.  Denies chest pain or palpitations or dyspnea at rest.  Assessment/Plan: Principal Problem:   Sepsis due to undetermined organism [facility]) Active Problems:   Diabetes mellitus without complication (HCC)   Hyponatremia   Polycythemia   Abnormal LFTs   Hypophosphatemia   Altered mental status   Sepsis secondary to gram-positive cocci bacteremia versus others Presented with fever with T-max of 104.4, tachycardia with heart rate of 116, tachypnea with respiration rate of 30, hypotension requiring boluses of IV fluids to maintain a map greater than 65 Blood cultures drawn on 09/01/2018 revealed gram-positive cocci Currently on broad-spectrum empiric antibiotics cefazolin Infectious disease following Continue antibiotics as recommended by infectious disease: Cefazolin Continue to monitor fever curve  and WBC Obtain CBC in the morning Repeat blood cultures x2 peripherally done on 09/03/2018 no growth in 2 days Hepatitis A, B, and C screening negative Influenza A&B negative CSF Gram stain negative CSF culture negative Sepsis physiology is resolving Leukocytosis has resolved We will call [name] on Monday to arrange for TEE.  No TEE service over the weekend  Hypotension Ordered random cortisol level to rule out adrenal insufficiency, low at 6.4  Maintain map greater than 65 Continue to monitor vital signs  Chronic intermittent bradycardia Asymptomatic Being followed by cardiology outpatient for his chronic sinus bradycardia Avoid beta-blockers and calcium channel blocker  Hypokalemia Potassium 3.4 Repleted Repeat BMP in the morning  Resolved acute metabolic encephalopathy suspect secondary to above More alert and oriented x3 Continue IV fluid Continue IV antibiotics Continue supportive care  Diarrhea, improving GI panel and cdiff pcr negative Continue Imodium as needed  Hypotension Maintain map greater than 65 Continue IV fluid Continue to closely monitor vital signs  Hypokalemia Repleted Repeat BMP in the morning  Type 2 diabetes On glipizide at home Obtain A1c Continue insulin sliding scale   DVT prophylaxis:  Subcu Lovenox daily Code Status: Full code. Family Communication:  None at bedside Disposition Plan:  Home when clinically stable Consults called: Infectious diseases Karolee Ohs, MD).  Objective: Vitals:   09/04/18 1901 09/04/18 2023 09/05/18 0416 09/05/18 1117  BP: 130/73  (!) 119/55   Pulse: (!) 49 (!) 49 (!) 42 88  Resp: 20  16 18   Temp: 97.8 F (36.6 C)  97.7 F (36.5 C)   TempSrc: Oral  Oral   SpO2: 96% 97% 96% 94%  Weight:      Height: 5' 5.5" (1.664 m)  Intake/Output Summary (Last 24 hours) at 09/05/2018 1135 Last data filed at 09/04/2018 1400 Gross per 24 hour  Intake 458.31 ml  Output -  Net 458.31 ml   Filed  Weights   09/01/18 0800 09/03/18 0500  Weight: 94.1 kg 98.3 kg    Exam:  . General: 73 y.o. year-old male well-developed well-nourished no acute distress.  Alert and oriented x3.  Cardiovascular: Regular rate and rhythm with no rubs or gallops.  No JVD or thyromegaly . Respiratory: Clear to Auscultation with No Wheezes or Rales.  Good Inspiratory Effort. . Abdomen: Soft nontender nondistended with hyperactive bowel sounds x4 quadrants. . Musculoskeletal: No lower extremity edema. 2/4 pulses in all 4 extremities. Marland Kitchen Psychiatry: Mood is appropriate for condition and setting.   Data Reviewed: CBC: Recent Labs  Lab 09/01/18 0501 09/02/18 0531 09/03/18 0259 09/04/18 0249 09/05/18 1018  WBC 15.2* 14.9* 13.3* 10.3 12.4*  NEUTROABS 14.0* 11.6* 10.2* 7.0 9.3*  HGB 17.6* 14.0 14.6 13.6 15.7  HCT 52.2* 43.6 45.1 42.1 47.6  MCV 89.2 94.6 93.4 92.9 91.7  PLT 158 142* 180 179 683   Basic Metabolic Panel: Recent Labs  Lab 09/01/18 0501 09/01/18 1129 09/02/18 0531 09/03/18 0259 09/04/18 0249 09/05/18 1018  NA 131* 136 137 137 137 136  K 3.8 3.2* 3.9 3.5 3.1* 3.4*  CL 99 106 111 107 107 102  CO2 19* 23 20* 22 21* 28  GLUCOSE 176* 152* 89 115* 117* 175*  BUN 25* 22 19 14 9  7*  CREATININE 0.98 0.98 0.85 0.95 0.88  0.93 0.91  CALCIUM 8.6* 7.2* 7.5* 8.1* 8.0* 8.3*  MG 2.2  --   --   --   --   --   PHOS 2.2*  --   --   --   --   --    GFR: Estimated Creatinine Clearance: 82.2 mL/min (by C-G formula based on SCr of 0.91 mg/dL). Liver Function Tests: Recent Labs  Lab 09/01/18 0501 09/02/18 0531 09/03/18 0259  AST 46* 36 35  ALT 57* 41 38  ALKPHOS 72 46 52  BILITOT 2.0* 1.3* 0.9  PROT 7.0 4.9* 5.5*  ALBUMIN 3.4* 2.4* 2.7*   No results for input(s): LIPASE, AMYLASE in the last 168 hours. No results for input(s): AMMONIA in the last 168 hours. Coagulation Profile: Recent Labs  Lab 09/01/18 0501  INR 1.08   Cardiac Enzymes: Recent Labs  Lab 09/01/18 1129 09/01/18 1647   TROPONINI 0.03* 0.03*   BNP (last 3 results) No results for input(s): PROBNP in the last 8760 hours. HbA1C: Recent Labs    09/04/18 0320  HGBA1C 7.4*   CBG: Recent Labs  Lab 09/04/18 0816 09/04/18 1159 09/04/18 1541 09/04/18 2057 09/05/18 0822  GLUCAP 138* 128* 110* 153* 175*   Lipid Profile: No results for input(s): CHOL, HDL, LDLCALC, TRIG, CHOLHDL, LDLDIRECT in the last 72 hours. Thyroid Function Tests: No results for input(s): TSH, T4TOTAL, FREET4, T3FREE, THYROIDAB in the last 72 hours. Anemia Panel: No results for input(s): VITAMINB12, FOLATE, FERRITIN, TIBC, IRON, RETICCTPCT in the last 72 hours. Urine analysis:    Component Value Date/Time   COLORURINE YELLOW 09/01/2018 [place] 09/01/2018 0514   LABSPEC 1.025 09/01/2018 [place] 5.0 09/01/2018 0514   GLUCOSEU NEGATIVE 09/01/2018 0514   HGBUR MODERATE (A) 09/01/2018 0514   BILIRUBINUR NEGATIVE 09/01/2018 0514   KETONESUR 20 (A) 09/01/2018 0514   PROTEINUR 100 (A) 09/01/2018 0514   NITRITE NEGATIVE 09/01/2018 0514  LEUKOCYTESUR NEGATIVE 09/01/2018 0514   Sepsis Labs: @LABRCNTIP (procalcitonin:4,lacticidven:4)  ) Recent Results (from the past 240 hour(s))  Culture, blood (Routine x 2)     Status: Abnormal   Collection Time: 09/01/18  5:03 AM  Result Value Ref Range Status   Specimen Description   Final    BLOOD BLOOD LEFT FOREARM Performed at [facility] [address], [place], [place] [address]    Special Requests   Final    BOTTLES DRAWN AEROBIC AND ANAEROBIC Blood Culture adequate volume Performed at [facility] [address] [place], [place] [address]    Culture  Setup Time   Final    GRAM POSITIVE COCCI AEROBIC BOTTLE ONLY CRITICAL RESULT CALLED TO, READ BACK BY AND VERIFIED WITH: PHARMD M RENZ [id number] MLM    Culture (A)  Final    STAPHYLOCOCCUS SPECIES (COAGULASE NEGATIVE) THE SIGNIFICANCE OF ISOLATING THIS ORGANISM  FROM A SINGLE SET OF BLOOD CULTURES WHEN MULTIPLE SETS ARE DRAWN IS UNCERTAIN. PLEASE NOTIFY THE MICROBIOLOGY DEPARTMENT WITHIN ONE WEEK IF SPECIATION AND SENSITIVITIES ARE REQUIRED. Performed at [facility], [place] [address] [place], [place] [address]    Report Status 09/03/2018 FINAL  Final  Culture, blood (Routine x 2)     Status: None (Preliminary result)   Collection Time: 09/01/18  5:03 AM  Result Value Ref Range Status   Specimen Description   Final    BLOOD BLOOD RIGHT FOREARM Performed at [facility] [address] [place], [place] [address]    Special Requests   Final    BOTTLES DRAWN AEROBIC AND ANAEROBIC Blood Culture adequate volume Performed at [facility] [address] [place], [place] [address]    Culture   Final    NO GROWTH 4 DAYS Performed at [facility], [place] [address], [place], [place] [address]    Report Status PENDING  Incomplete  Blood Culture ID Panel (Reflexed)     Status: Abnormal   Collection Time: 09/01/18  5:03 AM  Result Value Ref Range Status   Enterococcus species NOT DETECTED NOT DETECTED Final   Listeria monocytogenes NOT DETECTED NOT DETECTED Final   Staphylococcus species DETECTED (A) NOT DETECTED Final    Comment: Methicillin (oxacillin) susceptible coagulase negative staphylococcus. Possible blood culture contaminant (unless isolated from more than one blood culture draw or clinical case suggests pathogenicity). No antibiotic treatment is indicated for blood  culture contaminants. CRITICAL RESULT CALLED TO, READ BACK BY AND VERIFIED WITH: Minette Brine [id number] MLM    Staphylococcus aureus (BCID) NOT DETECTED NOT DETECTED Final   Methicillin resistance NOT DETECTED NOT DETECTED Final   Streptococcus species NOT DETECTED NOT DETECTED Final   Streptococcus agalactiae NOT DETECTED NOT DETECTED Final   Streptococcus pneumoniae NOT DETECTED NOT DETECTED Final   Streptococcus pyogenes NOT  DETECTED NOT DETECTED Final   Acinetobacter baumannii NOT DETECTED NOT DETECTED Final   Enterobacteriaceae species NOT DETECTED NOT DETECTED Final   Enterobacter cloacae complex NOT DETECTED NOT DETECTED Final   Escherichia coli NOT DETECTED NOT DETECTED Final   Klebsiella oxytoca NOT DETECTED NOT DETECTED Final   Klebsiella pneumoniae NOT DETECTED NOT DETECTED Final   Proteus species NOT DETECTED NOT DETECTED Final   Serratia marcescens NOT DETECTED NOT DETECTED Final   Haemophilus influenzae NOT DETECTED NOT DETECTED Final   Neisseria meningitidis NOT DETECTED NOT DETECTED Final   Pseudomonas aeruginosa NOT DETECTED NOT DETECTED Final   Candida  albicans NOT DETECTED NOT DETECTED Final   Candida glabrata NOT DETECTED NOT DETECTED Final   Candida krusei NOT DETECTED NOT DETECTED Final   Candida parapsilosis NOT DETECTED NOT DETECTED Final   Candida tropicalis NOT DETECTED NOT DETECTED Final    Comment: Performed at [facility], [place] [address] [place], [place] [address]  Urine culture     Status: None   Collection Time: 09/01/18  5:07 AM  Result Value Ref Range Status   Specimen Description   Final    URINE, CATHETERIZED Performed at [facility] [address] [place], [place] [address]    Special Requests   Final    NONE Performed at [facility], [place] [address] [place], [place] [address]    Culture   Final    NO GROWTH Performed at [facility], [place] [address], [place], [place] [address]    Report Status 09/02/2018 FINAL  Final  CSF culture     Status: None   Collection Time: 09/01/18  6:43 AM  Result Value Ref Range Status   Specimen Description   Final    CSF Performed at [facility] [address], [place], [place] [address]    Special Requests   Final    NONE Performed at [facility], [place] [address] [place], [place] [address]    Gram Stain   Final    WBC PRESENT,  PREDOMINANTLY MONONUCLEAR NO ORGANISMS SEEN CYTOSPIN SMEAR Gram Stain Report Called to,Read Back By and Verified With: SMITH,T. RN @0738  ON 1.22.2020 BY [email or web address]    Culture   Final    NO GROWTH 3 DAYS Performed at [facility], [place] [address], [place], [place] [address]    Report Status 09/04/2018 FINAL  Final  MRSA PCR Screening     Status: None   Collection Time: 09/01/18  7:55 AM  Result Value Ref Range Status   MRSA by PCR NEGATIVE NEGATIVE Final    Comment:        The GeneXpert MRSA Assay (FDA approved for NASAL specimens only), is one component of a comprehensive MRSA colonization surveillance program. It is not intended to diagnose MRSA infection nor to guide or monitor treatment for MRSA infections. Performed at [facility], [place] [address], [place], [place] [address]   Gastrointestinal Panel by PCR , Stool     Status: None   Collection Time: 09/02/18  8:54 AM  Result Value Ref Range Status   Campylobacter species NOT DETECTED NOT DETECTED Final   Plesimonas shigelloides NOT DETECTED NOT DETECTED Final   Salmonella species NOT DETECTED NOT DETECTED Final   Yersinia enterocolitica NOT DETECTED NOT DETECTED Final   Vibrio species NOT DETECTED NOT DETECTED Final   Vibrio cholerae NOT DETECTED NOT DETECTED Final   Enteroaggregative E coli (EAEC) NOT DETECTED NOT DETECTED Final   Enteropathogenic E coli (EPEC) NOT DETECTED NOT DETECTED Final   Enterotoxigenic E coli (ETEC) NOT DETECTED NOT DETECTED Final   Shiga like toxin producing E coli (STEC) NOT DETECTED NOT DETECTED Final   Shigella/Enteroinvasive E coli (EIEC) NOT DETECTED NOT DETECTED Final   Cryptosporidium NOT DETECTED NOT DETECTED Final   Cyclospora cayetanensis NOT DETECTED NOT DETECTED Final   Entamoeba histolytica NOT DETECTED NOT DETECTED Final   Giardia lamblia NOT DETECTED NOT DETECTED Final   Adenovirus F40/41 NOT DETECTED NOT DETECTED Final   Astrovirus NOT DETECTED NOT  DETECTED Final  Norovirus GI/GII NOT DETECTED NOT DETECTED Final   Rotavirus A NOT DETECTED NOT DETECTED Final   Sapovirus (I, II, IV, and V) NOT DETECTED NOT DETECTED Final    Comment: Performed at [facility], [address] [place], [place] [address]  C difficile quick scan w PCR reflex     Status: None   Collection Time: 09/02/18  3:32 PM  Result Value Ref Range Status   C Diff antigen NEGATIVE NEGATIVE Final   C Diff toxin NEGATIVE NEGATIVE Final   C Diff interpretation No C. difficile detected.  Final    Comment: Performed at [facility], [place] [address] [place], [place] [address]  Culture, blood (routine x 2)     Status: None (Preliminary result)   Collection Time: 09/03/18  3:24 PM  Result Value Ref Range Status   Specimen Description   Final    BLOOD LEFT HAND Performed at [facility] [address], [place], [place] [address]    Special Requests   Final    BOTTLES DRAWN AEROBIC ONLY Blood Culture adequate volume Performed at [facility] [address] [place], [place] [address]    Culture   Final    NO GROWTH 2 DAYS Performed at [facility] [address] [place], [place] [address]    Report Status PENDING  Incomplete  Culture, blood (routine x 2)     Status: None (Preliminary result)   Collection Time: 09/03/18  3:29 PM  Result Value Ref Range Status   Specimen Description   Final    BLOOD RIGHT ANTECUBITAL Performed at [facility] [address] [place], [place] [address]    Special Requests   Final    BOTTLES DRAWN AEROBIC AND ANAEROBIC Blood Culture adequate volume Performed at [facility] [address], [place], [place] [address]    Culture   Final    NO GROWTH 2 DAYS Performed at [facility] [address], [place],  [address]    Report Status PENDING  Incomplete      Studies: No results found.  Scheduled  Meds: . enoxaparin (LOVENOX) injection  40 mg Subcutaneous Q24H  . insulin aspart  0-5 Units Subcutaneous QHS  . insulin aspart  0-9 Units Subcutaneous TID WC  . mouth rinse  15 mL Mouth Rinse BID  . mometasone-formoterol  2 puff Inhalation BID  . potassium chloride  40 mEq Oral Once    Continuous Infusions: . sodium chloride 10 mL/hr at 09/02/18 0313  .  ceFAZolin (ANCEF) IV 2 g (09/05/18 5038)  . dextrose 5 % and 0.45% NaCl 50 mL/hr at 09/05/18 0207     LOS: 4 days     Kayleen Memos, MD [facility] Pager [phone]  If 7PM-7AM, please contact night-coverage [email or web address] Password [facility] 09/05/2018, 11:35 AM

## 2018-09-06 ENCOUNTER — Encounter (HOSPITAL_COMMUNITY): Payer: Self-pay | Admitting: Physician Assistant

## 2018-09-06 LAB — CBC WITH DIFFERENTIAL/PLATELET
ABS IMMATURE GRANULOCYTES: 0.31 10*3/uL — AB (ref 0.00–0.07)
Basophils Absolute: 0.1 10*3/uL (ref 0.0–0.1)
Basophils Relative: 1 %
Eosinophils Absolute: 0.2 10*3/uL (ref 0.0–0.5)
Eosinophils Relative: 2 %
HCT: 51.2 % (ref 39.0–52.0)
HEMOGLOBIN: 16.6 g/dL (ref 13.0–17.0)
Immature Granulocytes: 2 %
Lymphocytes Relative: 13 %
Lymphs Abs: 1.9 10*3/uL (ref 0.7–4.0)
MCH: 30.4 pg (ref 26.0–34.0)
MCHC: 32.4 g/dL (ref 30.0–36.0)
MCV: 93.8 fL (ref 80.0–100.0)
Monocytes Absolute: 1.2 10*3/uL — ABNORMAL HIGH (ref 0.1–1.0)
Monocytes Relative: 8 %
Neutro Abs: 11.1 10*3/uL — ABNORMAL HIGH (ref 1.7–7.7)
Neutrophils Relative %: 74 %
Platelets: 232 10*3/uL (ref 150–400)
RBC: 5.46 MIL/uL (ref 4.22–5.81)
RDW: 14.6 % (ref 11.5–15.5)
WBC: 14.9 10*3/uL — ABNORMAL HIGH (ref 4.0–10.5)
nRBC: 0 % (ref 0.0–0.2)

## 2018-09-06 LAB — CULTURE, BLOOD (ROUTINE X 2)
Culture: NO GROWTH
SPECIAL REQUESTS: ADEQUATE

## 2018-09-06 LAB — GLUCOSE, CAPILLARY
Glucose-Capillary: 80 mg/dL (ref 70–99)
Glucose-Capillary: 67 mg/dL — ABNORMAL LOW (ref 70–99)
Glucose-Capillary: 108 mg/dL — ABNORMAL HIGH (ref 70–99)
Glucose-Capillary: 147 mg/dL — ABNORMAL HIGH (ref 70–99)
Glucose-Capillary: 165 mg/dL — ABNORMAL HIGH (ref 70–99)
Glucose-Capillary: 142 mg/dL — ABNORMAL HIGH (ref 70–99)
Glucose-Capillary: 123 mg/dL — ABNORMAL HIGH (ref 70–99)

## 2018-09-06 LAB — BASIC METABOLIC PANEL
ANION GAP: 9 (ref 5–15)
BUN: 5 mg/dL — ABNORMAL LOW (ref 8–23)
CO2: 24 mmol/L (ref 22–32)
Calcium: 8.5 mg/dL — ABNORMAL LOW (ref 8.9–10.3)
Chloride: 103 mmol/L (ref 98–111)
Creatinine, Ser: 0.86 mg/dL (ref 0.61–1.24)
GFR calc Af Amer: >60 mL/min (ref >60)
GFR calc non Af Amer: >60 mL/min (ref >60)
Glucose, Bld: 131 mg/dL — ABNORMAL HIGH (ref 70–99)
POTASSIUM: 3.5 mmol/L (ref 3.5–5.1)
Sodium: 136 mmol/L (ref 135–145)

## 2018-09-06 NOTE — Progress Notes (Addendum)
    [facility] has been requested to perform a transesophageal echocardiogram on 01/28 for bacteremia w/ hx AVR.  After careful review of history and examination, the risks and benefits of transesophageal echocardiogram have been explained including risks of esophageal damage, perforation (1:10,000 risk), bleeding, pharyngeal hematoma as well as other potential complications associated with conscious sedation including aspiration, arrhythmia, respiratory failure and death. Alternatives to treatment were discussed, questions were answered.  Patient has had this before, required general anesthesia. Also has extreme dislike to numbing solution sprayed orally beforehand. Info communicated to [place], pt and wife informed that the procedure might be delayed due to needing anesthesia.  Rosaria Ferries, PA-C 09/06/2018 10:43 AM   Addendum: Patient now states that he is willing to try the TEE with sedation only, not anesthesia.  Rosaria Ferries, PA-C 09/06/2018 3:45 PM [facility] [phone]

## 2018-09-06 NOTE — Progress Notes (Signed)
[facility] Infusion Coordinator will follow pt with ID team to support home infusion pharmacy services for home IV ABX at DC to home.  If patient discharges after hours, please call [phone].   Michael Hines 09/06/2018, 8:25 AM

## 2018-09-06 NOTE — Progress Notes (Signed)
[facility] set up for tomorrow.  Patient to be at [facility] at 9 am, [facility] aware.

## 2018-09-06 NOTE — H&P (View-Only) (Signed)
PROGRESS NOTE  Michael Hines:[id number] DOB: 09-01-1945 DOA: 09/01/2018 PCP: Finis Bud, MD  HPI/Recap of past 24 hours: Michael Hines is a 73 y.o. male with medical history significant of anxiety, B12 deficiency, back pain, type 2 diabetes, hyperlipidemia, testosterone insufficiency, aortic insufficiency with a history of aortic valve replacement who is coming to the emergency department with altered mental status and fever 3 days after receiving the influenza vaccine. TRH asked to admit for sepsis due to undetermined organism.  Infectious disease consulted and following.  09/02/2018: Continues to have diarrhea.  GI panel negative.  C. difficile PCR negative. 09/03/18: Examined at bedside.  More alert today.   09/04/2018: Reports persistent diarrhea with no abdominal pain or nausea.  He wants to go home.  No TEE service over the weekend per cardiology.  Will contact [name] on Monday to arrange for TEE.  Cortisol level ordered and pending. Hypotension has resolved. 09/05/2018: Intermittent sinus bradycardia noted.    09/06/2018: Patient seen and examined at bedside.  No acute events overnight.  He would like to go home.  TEE planned tomorrow at [facility].  Patient will return to [facility] post TEE.   Assessment/Plan: Principal Problem:   Sepsis due to undetermined organism [facility]) Active Problems:   Diabetes mellitus without complication (HCC)   Hyponatremia   Polycythemia   Abnormal LFTs   Hypophosphatemia   Altered mental status   Sepsis secondary to gram-positive cocci bacteremia versus others Presented with fever with T-max of 104.4, tachycardia with heart rate of 116, tachypnea with respiration rate of 30, hypotension requiring boluses of IV fluids to maintain a map greater than 65 Blood cultures drawn on 09/01/2018 revealed gram-positive cocci Currently on broad-spectrum empiric antibiotics cefazolin Infectious disease following Continue antibiotics as  recommended by infectious disease: Cefazolin Continue to monitor fever curve and WBC Leukocytosis persistent with WBC trending up from 12 K to 14 K Repeat blood cultures x2 peripherally done on 09/03/2018 no growth to date Hepatitis A, B, and C screening negative Influenza A&B negative CSF Gram stain negative CSF culture negative TEE planned tomorrow 09/07/2018 at [facility]  Resolving hypotension  Ordered random cortisol level to rule out adrenal insufficiency, low at 6.4  Has been maintaining a map above 65 Continue to closely monitor vital signs  Chronic intermittent bradycardia Asymptomatic Being followed by cardiology outpatient for his chronic sinus bradycardia Avoid beta-blockers and calcium channel blocker  Resolved hypokalemia post repletion   Resolved acute metabolic encephalopathy suspect secondary to above More alert and oriented x3 Continue IV fluid Continue IV antibiotics Continue supportive care  Diarrhea, improving GI panel and cdiff pcr negative Continue Imodium as needed  Hypotension Maintain map greater than 65 Continue IV fluid Continue to closely monitor vital signs  Type 2 diabetes On glipizide at home Hemoglobin A1c 7.4 on 09/04/2018 Continue insulin sliding scale   DVT prophylaxis:  Subcu Lovenox daily Code Status: Full code. Family Communication:  None at bedside Disposition Plan:  Home when clinically stable Consults called: Infectious diseases Karolee Ohs, MD).  Objective: Vitals:   09/06/18 0655 09/06/18 1016 09/06/18 1052 09/06/18 1451  BP: 103/88 (!) 112/94  (!) 121/95  Pulse: 86 68  76  Resp: 18 16  18   Temp: 98.7 F (37.1 C) (!) 97.5 F (36.4 C)  98 F (36.7 C)  TempSrc:  Oral  Oral  SpO2: 95% 98% 97% 98%  Weight:      Height:  Intake/Output Summary (Last 24 hours) at 09/06/2018 1609 Last data filed at 09/06/2018 1500 Gross per 24 hour  Intake 2945.6 ml  Output -  Net 2945.6 ml   Filed Weights    09/01/18 0800 09/03/18 0500  Weight: 94.1 kg 98.3 kg    Exam:  . General: 73 y.o. year-old male well-developed well-nourished in no acute distress.  Alert and oriented x3.  Cardiovascular: Regular rate and rhythm with no rubs or gallops.  No JVD or thyromegaly . Respiratory: Clear to auscultation with no wheezes or rales.  Good inspiratory effort.   . Abdomen: Soft nontender nondistended with hyperactive bowel sounds x4 quadrants. . Musculoskeletal: No lower extremity edema. 2/4 pulses in all 4 extremities. Marland Kitchen Psychiatry: Mood is appropriate for condition and setting.   Data Reviewed: CBC: Recent Labs  Lab 09/02/18 0531 09/03/18 0259 09/04/18 0249 09/05/18 1018 09/06/18 0546  WBC 14.9* 13.3* 10.3 12.4* 14.9*  NEUTROABS 11.6* 10.2* 7.0 9.3* 11.1*  HGB 14.0 14.6 13.6 15.7 16.6  HCT 43.6 45.1 42.1 47.6 51.2  MCV 94.6 93.4 92.9 91.7 93.8  PLT 142* 180 179 229 102   Basic Metabolic Panel: Recent Labs  Lab 09/01/18 0501  09/02/18 0531 09/03/18 0259 09/04/18 0249 09/05/18 1018 09/06/18 0546  NA 131*   < > 137 137 137 136 136  K 3.8   < > 3.9 3.5 3.1* 3.4* 3.5  CL 99   < > 111 107 107 102 103  CO2 19*   < > 20* 22 21* 28 24  GLUCOSE 176*   < > 89 115* 117* 175* 131*  BUN 25*   < > 19 14 9  7* 5*  CREATININE 0.98   < > 0.85 0.95 0.88  0.93 0.91 0.86  CALCIUM 8.6*   < > 7.5* 8.1* 8.0* 8.3* 8.5*  MG 2.2  --   --   --   --   --   --   PHOS 2.2*  --   --   --   --   --   --    < > = values in this interval not displayed.   GFR: Estimated Creatinine Clearance: 86.9 mL/min (by C-G formula based on SCr of 0.86 mg/dL). Liver Function Tests: Recent Labs  Lab 09/01/18 0501 09/02/18 0531 09/03/18 0259  AST 46* 36 35  ALT 57* 41 38  ALKPHOS 72 46 52  BILITOT 2.0* 1.3* 0.9  PROT 7.0 4.9* 5.5*  ALBUMIN 3.4* 2.4* 2.7*   No results for input(s): LIPASE, AMYLASE in the last 168 hours. No results for input(s): AMMONIA in the last 168 hours. Coagulation Profile: Recent Labs    Lab 09/01/18 0501  INR 1.08   Cardiac Enzymes: Recent Labs  Lab 09/01/18 1129 09/01/18 1647  TROPONINI 0.03* 0.03*   BNP (last 3 results) No results for input(s): PROBNP in the last 8760 hours. HbA1C: Recent Labs    09/04/18 0320  HGBA1C 7.4*   CBG: Recent Labs  Lab 09/05/18 1250 09/05/18 1706 09/05/18 2021 09/06/18 0740 09/06/18 1157  GLUCAP 126* 121* 125* 147* 165*   Lipid Profile: No results for input(s): CHOL, HDL, LDLCALC, TRIG, CHOLHDL, LDLDIRECT in the last 72 hours. Thyroid Function Tests: No results for input(s): TSH, T4TOTAL, FREET4, T3FREE, THYROIDAB in the last 72 hours. Anemia Panel: No results for input(s): VITAMINB12, FOLATE, FERRITIN, TIBC, IRON, RETICCTPCT in the last 72 hours. Urine analysis:    Component Value Date/Time   COLORURINE YELLOW 09/01/2018 [place]  09/01/2018 0514   LABSPEC 1.025 09/01/2018 0514   PHURINE 5.0 09/01/2018 0514   GLUCOSEU NEGATIVE 09/01/2018 0514   HGBUR MODERATE (A) 09/01/2018 0514   BILIRUBINUR NEGATIVE 09/01/2018 0514   KETONESUR 20 (A) 09/01/2018 0514   PROTEINUR 100 (A) 09/01/2018 0514   NITRITE NEGATIVE 09/01/2018 0514   LEUKOCYTESUR NEGATIVE 09/01/2018 0514   Sepsis Labs: @LABRCNTIP (procalcitonin:4,lacticidven:4)  ) Recent Results (from the past 240 hour(s))  Culture, blood (Routine x 2)     Status: Abnormal   Collection Time: 09/01/18  5:03 AM  Result Value Ref Range Status   Specimen Description   Final    BLOOD BLOOD LEFT FOREARM Performed at [facility] [address] [place], [place] [address]    Special Requests   Final    BOTTLES DRAWN AEROBIC AND ANAEROBIC Blood Culture adequate volume Performed at [facility] [address], [place], [place] [address]    Culture  Setup Time   Final    GRAM POSITIVE COCCI AEROBIC BOTTLE ONLY CRITICAL RESULT CALLED TO, READ BACK BY AND VERIFIED WITH: PHARMD M RENZ [id number] MLM    Culture (A)   Final    STAPHYLOCOCCUS SPECIES (COAGULASE NEGATIVE) THE SIGNIFICANCE OF ISOLATING THIS ORGANISM FROM A SINGLE SET OF BLOOD CULTURES WHEN MULTIPLE SETS ARE DRAWN IS UNCERTAIN. PLEASE NOTIFY THE MICROBIOLOGY DEPARTMENT WITHIN ONE WEEK IF SPECIATION AND SENSITIVITIES ARE REQUIRED. Performed at [facility], [place] [address] [place], [place] [address]    Report Status 09/03/2018 FINAL  Final  Culture, blood (Routine x 2)     Status: None   Collection Time: 09/01/18  5:03 AM  Result Value Ref Range Status   Specimen Description   Final    BLOOD BLOOD RIGHT FOREARM Performed at [facility] [address] [place], [place] [address]    Special Requests   Final    BOTTLES DRAWN AEROBIC AND ANAEROBIC Blood Culture adequate volume Performed at [place] [address], [place], [place] [address]    Culture   Final    NO GROWTH 5 DAYS Performed at [facility], [place] [address] [place],  [address]    Report Status 09/06/2018 FINAL  Final  Blood Culture ID Panel (Reflexed)     Status: Abnormal   Collection Time: 09/01/18  5:03 AM  Result Value Ref Range Status   Enterococcus species NOT DETECTED NOT DETECTED Final   Listeria monocytogenes NOT DETECTED NOT DETECTED Final   Staphylococcus species DETECTED (A) NOT DETECTED Final    Comment: Methicillin (oxacillin) susceptible coagulase negative staphylococcus. Possible blood culture contaminant (unless isolated from more than one blood culture draw or clinical case suggests pathogenicity). No antibiotic treatment is indicated for blood  culture contaminants. CRITICAL RESULT CALLED TO, READ BACK BY AND VERIFIED WITH: Minette Brine [id number] MLM    Staphylococcus aureus (BCID) NOT DETECTED NOT DETECTED Final   Methicillin resistance NOT DETECTED NOT DETECTED Final   Streptococcus species NOT DETECTED NOT DETECTED Final   Streptococcus agalactiae NOT DETECTED NOT DETECTED Final    Streptococcus pneumoniae NOT DETECTED NOT DETECTED Final   Streptococcus pyogenes NOT DETECTED NOT DETECTED Final   Acinetobacter baumannii NOT DETECTED NOT DETECTED Final   Enterobacteriaceae species NOT DETECTED NOT DETECTED Final   Enterobacter cloacae complex NOT DETECTED NOT DETECTED Final   Escherichia coli NOT DETECTED NOT DETECTED Final   Klebsiella oxytoca NOT DETECTED NOT DETECTED Final   Klebsiella  pneumoniae NOT DETECTED NOT DETECTED Final   Proteus species NOT DETECTED NOT DETECTED Final   Serratia marcescens NOT DETECTED NOT DETECTED Final   Haemophilus influenzae NOT DETECTED NOT DETECTED Final   Neisseria meningitidis NOT DETECTED NOT DETECTED Final   Pseudomonas aeruginosa NOT DETECTED NOT DETECTED Final   Candida albicans NOT DETECTED NOT DETECTED Final   Candida glabrata NOT DETECTED NOT DETECTED Final   Candida krusei NOT DETECTED NOT DETECTED Final   Candida parapsilosis NOT DETECTED NOT DETECTED Final   Candida tropicalis NOT DETECTED NOT DETECTED Final    Comment: Performed at [facility], [place] [address], [place], [place] [address]  Urine culture     Status: None   Collection Time: 09/01/18  5:07 AM  Result Value Ref Range Status   Specimen Description   Final    URINE, CATHETERIZED Performed at [facility] [address] [place], [place] [address]    Special Requests   Final    NONE Performed at [facility], [place] [address], [place], [place] [address]    Culture   Final    NO GROWTH Performed at [facility], [place] [address] [place], [place] [address]    Report Status 09/02/2018 FINAL  Final  CSF culture     Status: None   Collection Time: 09/01/18  6:43 AM  Result Value Ref Range Status   Specimen Description   Final    CSF Performed at [place] [address] [place], [place] [address]    Special Requests   Final    NONE Performed at [facility], [place] [address] [place], [place] [address]    Gram Stain   Final    WBC PRESENT, PREDOMINANTLY MONONUCLEAR NO ORGANISMS SEEN CYTOSPIN SMEAR Gram Stain Report Called to,Read Back By and Verified With: SMITH,T. RN @0738  ON 1.22.2020 BY [email or web address]    Culture   Final    NO GROWTH 3 DAYS Performed at [facility], [place] [address], [place], [place] [address]    Report Status 09/04/2018 FINAL  Final  MRSA PCR Screening     Status: None   Collection Time: 09/01/18  7:55 AM  Result Value Ref Range Status   MRSA by PCR NEGATIVE NEGATIVE Final    Comment:        The GeneXpert MRSA Assay (FDA approved for NASAL specimens only), is one component of a comprehensive MRSA colonization surveillance program. It is not intended to diagnose MRSA infection nor to guide or monitor treatment for MRSA infections. Performed at [facility], [place] [address] [place], [place] [address]   Gastrointestinal Panel by PCR , Stool     Status: None   Collection Time: 09/02/18  8:54 AM  Result Value Ref Range Status   Campylobacter species NOT DETECTED NOT DETECTED Final   Plesimonas shigelloides NOT DETECTED NOT DETECTED Final   Salmonella species NOT DETECTED NOT DETECTED Final   Yersinia enterocolitica NOT DETECTED NOT DETECTED Final   Vibrio species NOT DETECTED NOT DETECTED Final   Vibrio cholerae NOT DETECTED NOT DETECTED Final   Enteroaggregative E coli (EAEC) NOT DETECTED NOT DETECTED Final   Enteropathogenic E coli (EPEC) NOT DETECTED NOT DETECTED Final   Enterotoxigenic E coli (ETEC) NOT DETECTED NOT DETECTED Final   Shiga like toxin producing E coli (STEC) NOT DETECTED NOT DETECTED Final   Shigella/Enteroinvasive E coli (EIEC) NOT DETECTED NOT DETECTED Final  Cryptosporidium NOT DETECTED NOT DETECTED Final   Cyclospora cayetanensis NOT DETECTED NOT DETECTED Final   Entamoeba histolytica NOT DETECTED NOT DETECTED Final   Giardia lamblia NOT DETECTED NOT DETECTED Final    Adenovirus F40/41 NOT DETECTED NOT DETECTED Final   Astrovirus NOT DETECTED NOT DETECTED Final   Norovirus GI/GII NOT DETECTED NOT DETECTED Final   Rotavirus A NOT DETECTED NOT DETECTED Final   Sapovirus (I, II, IV, and V) NOT DETECTED NOT DETECTED Final    Comment: Performed at [facility], [place] [place], [place] [address]  C difficile quick scan w PCR reflex     Status: None   Collection Time: 09/02/18  3:32 PM  Result Value Ref Range Status   C Diff antigen NEGATIVE NEGATIVE Final   C Diff toxin NEGATIVE NEGATIVE Final   C Diff interpretation No C. difficile detected.  Final    Comment: Performed at [facility], [place] [address] [place], [place] [address]  Culture, blood (routine x 2)     Status: None (Preliminary result)   Collection Time: 09/03/18  3:24 PM  Result Value Ref Range Status   Specimen Description   Final    BLOOD LEFT HAND Performed at [facility] [address], [place], [place] [address]    Special Requests   Final    BOTTLES DRAWN AEROBIC ONLY Blood Culture adequate volume Performed at [facility] [address] [place], [place] [address]    Culture   Final    NO GROWTH 3 DAYS Performed at [facility], [place] [address] [place], [place] [address]    Report Status PENDING  Incomplete  Culture, blood (routine x 2)     Status: None (Preliminary result)   Collection Time: 09/03/18  3:29 PM  Result Value Ref Range Status   Specimen Description   Final    BLOOD RIGHT ANTECUBITAL Performed at [facility] [address] [place], [place] [address]    Special Requests   Final    BOTTLES DRAWN AEROBIC AND ANAEROBIC Blood Culture adequate volume Performed at [facility] [address] [place],  [address]    Culture   Final    NO GROWTH 3 DAYS Performed at [facility], [place] [address] [place],  [address]    Report  Status PENDING  Incomplete      Studies: No results found.  Scheduled Meds: . enoxaparin (LOVENOX) injection  40 mg Subcutaneous Q24H  . insulin aspart  0-5 Units Subcutaneous QHS  . insulin aspart  0-9 Units Subcutaneous TID WC  . mouth rinse  15 mL Mouth Rinse BID  . mometasone-formoterol  2 puff Inhalation BID    Continuous Infusions: . sodium chloride 10 mL/hr at 09/02/18 0313  .  ceFAZolin (ANCEF) IV 2 g (09/06/18 1439)  . dextrose 5 % and 0.45% NaCl 50 mL/hr at 09/06/18 0400     LOS: 5 days     Kayleen Memos, MD [facility] Pager [phone]  If 7PM-7AM, please contact night-coverage [email or web address] Password [facility] 09/06/2018, 4:09 PM

## 2018-09-06 NOTE — Progress Notes (Signed)
PROGRESS NOTE  Michael Hines GHW:[id number] DOB: 02/02/1946 DOA: 09/01/2018 PCP: Finis Bud, MD  HPI/Recap of past 24 hours: Michael Hines is a 73 y.o. male with medical history significant of anxiety, B12 deficiency, back pain, type 2 diabetes, hyperlipidemia, testosterone insufficiency, aortic insufficiency with a history of aortic valve replacement who is coming to the emergency department with altered mental status and fever 3 days after receiving the influenza vaccine. TRH asked to admit for sepsis due to undetermined organism.  Infectious disease consulted and following.  09/02/2018: Continues to have diarrhea.  GI panel negative.  C. difficile PCR negative. 09/03/18: Examined at bedside.  More alert today.   09/04/2018: Reports persistent diarrhea with no abdominal pain or nausea.  He wants to go home.  No TEE service over the weekend per cardiology.  Will contact [name] on Monday to arrange for TEE.  Cortisol level ordered and pending. Hypotension has resolved. 09/05/2018: Intermittent sinus bradycardia noted.    09/06/2018: Patient seen and examined at bedside.  No acute events overnight.  He would like to go home.  TEE planned tomorrow at [facility].  Patient will return to [facility] post TEE.   Assessment/Plan: Principal Problem:   Sepsis due to undetermined organism [facility]) Active Problems:   Diabetes mellitus without complication (HCC)   Hyponatremia   Polycythemia   Abnormal LFTs   Hypophosphatemia   Altered mental status   Sepsis secondary to gram-positive cocci bacteremia versus others Presented with fever with T-max of 104.4, tachycardia with heart rate of 116, tachypnea with respiration rate of 30, hypotension requiring boluses of IV fluids to maintain a map greater than 65 Blood cultures drawn on 09/01/2018 revealed gram-positive cocci Currently on broad-spectrum empiric antibiotics cefazolin Infectious disease following Continue antibiotics as  recommended by infectious disease: Cefazolin Continue to monitor fever curve and WBC Leukocytosis persistent with WBC trending up from 12 K to 14 K Repeat blood cultures x2 peripherally done on 09/03/2018 no growth to date Hepatitis A, B, and C screening negative Influenza A&B negative CSF Gram stain negative CSF culture negative TEE planned tomorrow 09/07/2018 at [facility]  Resolving hypotension  Ordered random cortisol level to rule out adrenal insufficiency, low at 6.4  Has been maintaining a map above 65 Continue to closely monitor vital signs  Chronic intermittent bradycardia Asymptomatic Being followed by cardiology outpatient for his chronic sinus bradycardia Avoid beta-blockers and calcium channel blocker  Resolved hypokalemia post repletion   Resolved acute metabolic encephalopathy suspect secondary to above More alert and oriented x3 Continue IV fluid Continue IV antibiotics Continue supportive care  Diarrhea, improving GI panel and cdiff pcr negative Continue Imodium as needed  Hypotension Maintain map greater than 65 Continue IV fluid Continue to closely monitor vital signs  Type 2 diabetes On glipizide at home Hemoglobin A1c 7.4 on 09/04/2018 Continue insulin sliding scale   DVT prophylaxis:  Subcu Lovenox daily Code Status: Full code. Family Communication:  None at bedside Disposition Plan:  Home when clinically stable Consults called: Infectious diseases Michael Ohs, MD).  Objective: Vitals:   09/06/18 0655 09/06/18 1016 09/06/18 1052 09/06/18 1451  BP: 103/88 (!) 112/94  (!) 121/95  Pulse: 86 68  76  Resp: 18 16  18   Temp: 98.7 F (37.1 C) (!) 97.5 F (36.4 C)  98 F (36.7 C)  TempSrc:  Oral  Oral  SpO2: 95% 98% 97% 98%  Weight:      Height:  Intake/Output Summary (Last 24 hours) at 09/06/2018 1609 Last data filed at 09/06/2018 1500 Gross per 24 hour  Intake 2945.6 ml  Output -  Net 2945.6 ml   Filed Weights    09/01/18 0800 09/03/18 0500  Weight: 94.1 kg 98.3 kg    Exam:  . General: 73 y.o. year-old male well-developed well-nourished in no acute distress.  Alert and oriented x3.  Cardiovascular: Regular rate and rhythm with no rubs or gallops.  No JVD or thyromegaly . Respiratory: Clear to auscultation with no wheezes or rales.  Good inspiratory effort.   . Abdomen: Soft nontender nondistended with hyperactive bowel sounds x4 quadrants. . Musculoskeletal: No lower extremity edema. 2/4 pulses in all 4 extremities. Marland Kitchen Psychiatry: Mood is appropriate for condition and setting.   Data Reviewed: CBC: Recent Labs  Lab 09/02/18 0531 09/03/18 0259 09/04/18 0249 09/05/18 1018 09/06/18 0546  WBC 14.9* 13.3* 10.3 12.4* 14.9*  NEUTROABS 11.6* 10.2* 7.0 9.3* 11.1*  HGB 14.0 14.6 13.6 15.7 16.6  HCT 43.6 45.1 42.1 47.6 51.2  MCV 94.6 93.4 92.9 91.7 93.8  PLT 142* 180 179 229 094   Basic Metabolic Panel: Recent Labs  Lab 09/01/18 0501  09/02/18 0531 09/03/18 0259 09/04/18 0249 09/05/18 1018 09/06/18 0546  NA 131*   < > 137 137 137 136 136  K 3.8   < > 3.9 3.5 3.1* 3.4* 3.5  CL 99   < > 111 107 107 102 103  CO2 19*   < > 20* 22 21* 28 24  GLUCOSE 176*   < > 89 115* 117* 175* 131*  BUN 25*   < > 19 14 9  7* 5*  CREATININE 0.98   < > 0.85 0.95 0.88  0.93 0.91 0.86  CALCIUM 8.6*   < > 7.5* 8.1* 8.0* 8.3* 8.5*  MG 2.2  --   --   --   --   --   --   PHOS 2.2*  --   --   --   --   --   --    < > = values in this interval not displayed.   GFR: Estimated Creatinine Clearance: 86.9 mL/min (by C-G formula based on SCr of 0.86 mg/dL). Liver Function Tests: Recent Labs  Lab 09/01/18 0501 09/02/18 0531 09/03/18 0259  AST 46* 36 35  ALT 57* 41 38  ALKPHOS 72 46 52  BILITOT 2.0* 1.3* 0.9  PROT 7.0 4.9* 5.5*  ALBUMIN 3.4* 2.4* 2.7*   No results for input(s): LIPASE, AMYLASE in the last 168 hours. No results for input(s): AMMONIA in the last 168 hours. Coagulation Profile: Recent Labs    Lab 09/01/18 0501  INR 1.08   Cardiac Enzymes: Recent Labs  Lab 09/01/18 1129 09/01/18 1647  TROPONINI 0.03* 0.03*   BNP (last 3 results) No results for input(s): PROBNP in the last 8760 hours. HbA1C: Recent Labs    09/04/18 0320  HGBA1C 7.4*   CBG: Recent Labs  Lab 09/05/18 1250 09/05/18 1706 09/05/18 2021 09/06/18 0740 09/06/18 1157  GLUCAP 126* 121* 125* 147* 165*   Lipid Profile: No results for input(s): CHOL, HDL, LDLCALC, TRIG, CHOLHDL, LDLDIRECT in the last 72 hours. Thyroid Function Tests: No results for input(s): TSH, T4TOTAL, FREET4, T3FREE, THYROIDAB in the last 72 hours. Anemia Panel: No results for input(s): VITAMINB12, FOLATE, FERRITIN, TIBC, IRON, RETICCTPCT in the last 72 hours. Urine analysis:    Component Value Date/Time   COLORURINE YELLOW 09/01/2018 [place]  09/01/2018 0514   LABSPEC 1.025 09/01/2018 0514   PHURINE 5.0 09/01/2018 0514   GLUCOSEU NEGATIVE 09/01/2018 0514   HGBUR MODERATE (A) 09/01/2018 0514   BILIRUBINUR NEGATIVE 09/01/2018 0514   KETONESUR 20 (A) 09/01/2018 0514   PROTEINUR 100 (A) 09/01/2018 0514   NITRITE NEGATIVE 09/01/2018 0514   LEUKOCYTESUR NEGATIVE 09/01/2018 0514   Sepsis Labs: @LABRCNTIP (procalcitonin:4,lacticidven:4)  ) Recent Results (from the past 240 hour(s))  Culture, blood (Routine x 2)     Status: Abnormal   Collection Time: 09/01/18  5:03 AM  Result Value Ref Range Status   Specimen Description   Final    BLOOD BLOOD LEFT FOREARM Performed at [facility], [place] [address] [place], [place] [address]    Special Requests   Final    BOTTLES DRAWN AEROBIC AND ANAEROBIC Blood Culture adequate volume Performed at [place] [address], [place], [place] [address]    Culture  Setup Time   Final    GRAM POSITIVE COCCI AEROBIC BOTTLE ONLY CRITICAL RESULT CALLED TO, READ BACK BY AND VERIFIED WITH: PHARMD M RENZ [id number] MLM    Culture (A)   Final    STAPHYLOCOCCUS SPECIES (COAGULASE NEGATIVE) THE SIGNIFICANCE OF ISOLATING THIS ORGANISM FROM A SINGLE SET OF BLOOD CULTURES WHEN MULTIPLE SETS ARE DRAWN IS UNCERTAIN. PLEASE NOTIFY THE MICROBIOLOGY DEPARTMENT WITHIN ONE WEEK IF SPECIATION AND SENSITIVITIES ARE REQUIRED. Performed at [facility], [place] [address], [place], [place] [address]    Report Status 09/03/2018 FINAL  Final  Culture, blood (Routine x 2)     Status: None   Collection Time: 09/01/18  5:03 AM  Result Value Ref Range Status   Specimen Description   Final    BLOOD BLOOD RIGHT FOREARM Performed at [place] [address] [place], [place] [address]    Special Requests   Final    BOTTLES DRAWN AEROBIC AND ANAEROBIC Blood Culture adequate volume Performed at [place] [address], [place], [place] [address]    Culture   Final    NO GROWTH 5 DAYS Performed at [facility], [place] [address] [place], [place] [address]    Report Status 09/06/2018 FINAL  Final  Blood Culture ID Panel (Reflexed)     Status: Abnormal   Collection Time: 09/01/18  5:03 AM  Result Value Ref Range Status   Enterococcus species NOT DETECTED NOT DETECTED Final   Listeria monocytogenes NOT DETECTED NOT DETECTED Final   Staphylococcus species DETECTED (A) NOT DETECTED Final    Comment: Methicillin (oxacillin) susceptible coagulase negative staphylococcus. Possible blood culture contaminant (unless isolated from more than one blood culture draw or clinical case suggests pathogenicity). No antibiotic treatment is indicated for blood  culture contaminants. CRITICAL RESULT CALLED TO, READ BACK BY AND VERIFIED WITH: Minette Brine [id number] MLM    Staphylococcus aureus (BCID) NOT DETECTED NOT DETECTED Final   Methicillin resistance NOT DETECTED NOT DETECTED Final   Streptococcus species NOT DETECTED NOT DETECTED Final   Streptococcus agalactiae NOT DETECTED NOT DETECTED Final    Streptococcus pneumoniae NOT DETECTED NOT DETECTED Final   Streptococcus pyogenes NOT DETECTED NOT DETECTED Final   Acinetobacter baumannii NOT DETECTED NOT DETECTED Final   Enterobacteriaceae species NOT DETECTED NOT DETECTED Final   Enterobacter cloacae complex NOT DETECTED NOT DETECTED Final   Escherichia coli NOT DETECTED NOT DETECTED Final   Klebsiella oxytoca NOT DETECTED NOT DETECTED Final   Klebsiella  pneumoniae NOT DETECTED NOT DETECTED Final   Proteus species NOT DETECTED NOT DETECTED Final   Serratia marcescens NOT DETECTED NOT DETECTED Final   Haemophilus influenzae NOT DETECTED NOT DETECTED Final   Neisseria meningitidis NOT DETECTED NOT DETECTED Final   Pseudomonas aeruginosa NOT DETECTED NOT DETECTED Final   Candida albicans NOT DETECTED NOT DETECTED Final   Candida glabrata NOT DETECTED NOT DETECTED Final   Candida krusei NOT DETECTED NOT DETECTED Final   Candida parapsilosis NOT DETECTED NOT DETECTED Final   Candida tropicalis NOT DETECTED NOT DETECTED Final    Comment: Performed at [facility], [place] [address] [place], [place] [address]  Urine culture     Status: None   Collection Time: 09/01/18  5:07 AM  Result Value Ref Range Status   Specimen Description   Final    URINE, CATHETERIZED Performed at [place] [address], [place], [place] [address]    Special Requests   Final    NONE Performed at [facility], [place] [address], [place], [place] [address]    Culture   Final    NO GROWTH Performed at [facility], [place] [address], [place], [place] [address]    Report Status 09/02/2018 FINAL  Final  CSF culture     Status: None   Collection Time: 09/01/18  6:43 AM  Result Value Ref Range Status   Specimen Description   Final    CSF Performed at [place] [address], [place], [place] [address]    Special Requests   Final    NONE Performed at [facility], [place] [address], [place], [place] [address]    Gram Stain   Final    WBC PRESENT, PREDOMINANTLY MONONUCLEAR NO ORGANISMS SEEN CYTOSPIN SMEAR Gram Stain Report Called to,Read Back By and Verified With: SMITH,T. RN @0738  ON 1.22.2020 BY [email or web address]    Culture   Final    NO GROWTH 3 DAYS Performed at [facility], [place] [address] [place], [place] [address]    Report Status 09/04/2018 FINAL  Final  MRSA PCR Screening     Status: None   Collection Time: 09/01/18  7:55 AM  Result Value Ref Range Status   MRSA by PCR NEGATIVE NEGATIVE Final    Comment:        The GeneXpert MRSA Assay (FDA approved for NASAL specimens only), is one component of a comprehensive MRSA colonization surveillance program. It is not intended to diagnose MRSA infection nor to guide or monitor treatment for MRSA infections. Performed at [facility], [place] [address] [place], [place] [address]   Gastrointestinal Panel by PCR , Stool     Status: None   Collection Time: 09/02/18  8:54 AM  Result Value Ref Range Status   Campylobacter species NOT DETECTED NOT DETECTED Final   Plesimonas shigelloides NOT DETECTED NOT DETECTED Final   Salmonella species NOT DETECTED NOT DETECTED Final   Yersinia enterocolitica NOT DETECTED NOT DETECTED Final   Vibrio species NOT DETECTED NOT DETECTED Final   Vibrio cholerae NOT DETECTED NOT DETECTED Final   Enteroaggregative E coli (EAEC) NOT DETECTED NOT DETECTED Final   Enteropathogenic E coli (EPEC) NOT DETECTED NOT DETECTED Final   Enterotoxigenic E coli (ETEC) NOT DETECTED NOT DETECTED Final   Shiga like toxin producing E coli (STEC) NOT DETECTED NOT DETECTED Final   Shigella/Enteroinvasive E coli (EIEC) NOT DETECTED NOT DETECTED Final  Cryptosporidium NOT DETECTED NOT DETECTED Final   Cyclospora cayetanensis NOT DETECTED NOT DETECTED Final   Entamoeba histolytica NOT DETECTED NOT DETECTED Final   Giardia lamblia NOT DETECTED NOT DETECTED Final    Adenovirus F40/41 NOT DETECTED NOT DETECTED Final   Astrovirus NOT DETECTED NOT DETECTED Final   Norovirus GI/GII NOT DETECTED NOT DETECTED Final   Rotavirus A NOT DETECTED NOT DETECTED Final   Sapovirus (I, II, IV, and V) NOT DETECTED NOT DETECTED Final    Comment: Performed at [facility], [place] [place], [place] [address]  C difficile quick scan w PCR reflex     Status: None   Collection Time: 09/02/18  3:32 PM  Result Value Ref Range Status   C Diff antigen NEGATIVE NEGATIVE Final   C Diff toxin NEGATIVE NEGATIVE Final   C Diff interpretation No C. difficile detected.  Final    Comment: Performed at [facility], [place] [address] [place], [place] [address]  Culture, blood (routine x 2)     Status: None (Preliminary result)   Collection Time: 09/03/18  3:24 PM  Result Value Ref Range Status   Specimen Description   Final    BLOOD LEFT HAND Performed at [place] [address], [place], [place] [address]    Special Requests   Final    BOTTLES DRAWN AEROBIC ONLY Blood Culture adequate volume Performed at [place] [address] [place], [place] [address]    Culture   Final    NO GROWTH 3 DAYS Performed at [facility], [place] [address], [place], [place] [address]    Report Status PENDING  Incomplete  Culture, blood (routine x 2)     Status: None (Preliminary result)   Collection Time: 09/03/18  3:29 PM  Result Value Ref Range Status   Specimen Description   Final    BLOOD RIGHT ANTECUBITAL Performed at [place] [address], [place], [place] [address]    Special Requests   Final    BOTTLES DRAWN AEROBIC AND ANAEROBIC Blood Culture adequate volume Performed at [place] [address], [place], [place] [address]    Culture   Final    NO GROWTH 3 DAYS Performed at [facility], [place] [address], [place], [place] [address]    Report  Status PENDING  Incomplete      Studies: No results found.  Scheduled Meds: . enoxaparin (LOVENOX) injection  40 mg Subcutaneous Q24H  . insulin aspart  0-5 Units Subcutaneous QHS  . insulin aspart  0-9 Units Subcutaneous TID WC  . mouth rinse  15 mL Mouth Rinse BID  . mometasone-formoterol  2 puff Inhalation BID    Continuous Infusions: . sodium chloride 10 mL/hr at 09/02/18 0313  .  ceFAZolin (ANCEF) IV 2 g (09/06/18 1439)  . dextrose 5 % and 0.45% NaCl 50 mL/hr at 09/06/18 0400     LOS: 5 days     Kayleen Memos, MD [facility] Pager [phone]  If 7PM-7AM, please contact night-coverage [email or web address] Password [facility] 09/06/2018, 4:09 PM

## 2018-09-06 NOTE — Progress Notes (Signed)
Patient scheduled for TEE @ [facility] @ 1856 on Tuesday, 1.28.2020. Patient needs to arrive via [facility] to [facility] @ 0900. RN made aware.

## 2018-09-07 ENCOUNTER — Encounter (HOSPITAL_COMMUNITY)
Admission: EM | Disposition: A | Discharge status: Home Health | Payer: Self-pay | Source: Home / Self Care | Attending: Internal Medicine

## 2018-09-07 ENCOUNTER — Encounter (HOSPITAL_COMMUNITY): Payer: Self-pay | Admitting: *Deleted

## 2018-09-07 ENCOUNTER — Ambulatory Visit (HOSPITAL_COMMUNITY): Payer: Medicare Other

## 2018-09-07 DIAGNOSIS — B9561 Methicillin susceptible Staphylococcus aureus infection as the cause of diseases classified elsewhere: Secondary | ICD-10-CM

## 2018-09-07 DIAGNOSIS — R7881 Bacteremia: Secondary | ICD-10-CM

## 2018-09-07 HISTORY — PX: TEE WITHOUT CARDIOVERSION: SHX5443

## 2018-09-07 LAB — BASIC METABOLIC PANEL
ANION GAP: 11 (ref 5–15)
BUN: 8 mg/dL (ref 8–23)
CO2: 24 mmol/L (ref 22–32)
Calcium: 8.6 mg/dL — ABNORMAL LOW (ref 8.9–10.3)
Chloride: 102 mmol/L (ref 98–111)
Creatinine, Ser: 0.99 mg/dL (ref 0.61–1.24)
GFR calc non Af Amer: >60 mL/min (ref >60)
Glucose, Bld: 139 mg/dL — ABNORMAL HIGH (ref 70–99)
Potassium: 3.5 mmol/L (ref 3.5–5.1)
SODIUM: 137 mmol/L (ref 135–145)

## 2018-09-07 LAB — CBC WITH DIFFERENTIAL/PLATELET
ABS IMMATURE GRANULOCYTES: 0.42 10*3/uL — AB (ref 0.00–0.07)
BASOS PCT: 1 %
Basophils Absolute: 0.1 10*3/uL (ref 0.0–0.1)
Eosinophils Absolute: 0.3 10*3/uL (ref 0.0–0.5)
Eosinophils Relative: 2 %
HCT: 53.1 % — ABNORMAL HIGH (ref 39.0–52.0)
HEMOGLOBIN: 17.2 g/dL — AB (ref 13.0–17.0)
Immature Granulocytes: 3 %
LYMPHS PCT: 14 %
Lymphs Abs: 2.2 10*3/uL (ref 0.7–4.0)
MCH: 29.7 pg (ref 26.0–34.0)
MCHC: 32.4 g/dL (ref 30.0–36.0)
MCV: 91.6 fL (ref 80.0–100.0)
Monocytes Absolute: 1.2 10*3/uL — ABNORMAL HIGH (ref 0.1–1.0)
Monocytes Relative: 7 %
NEUTROS ABS: 11.6 10*3/uL — AB (ref 1.7–7.7)
Neutrophils Relative %: 73 %
Platelets: 287 10*3/uL (ref 150–400)
RBC: 5.8 MIL/uL (ref 4.22–5.81)
RDW: 14.8 % (ref 11.5–15.5)
WBC: 15.9 10*3/uL — ABNORMAL HIGH (ref 4.0–10.5)
nRBC: 0 % (ref 0.0–0.2)

## 2018-09-07 SURGERY — ECHOCARDIOGRAM, TRANSESOPHAGEAL
Anesthesia: Moderate Sedation

## 2018-09-07 MED ORDER — FENTANYL CITRATE (PF) 100 MCG/2ML IJ SOLN
INTRAMUSCULAR | Status: AC
  Filled 2018-09-07: qty 2

## 2018-09-07 MED ORDER — GABAPENTIN 100 MG PO CAPS: 300.0000 mg | ORAL_CAPSULE | Freq: Three times a day (TID) | ORAL | 0 refills | Status: DC

## 2018-09-07 MED ORDER — SODIUM CHLORIDE 0.9 % IV SOLN: INTRAVENOUS | Status: DC

## 2018-09-07 MED ORDER — ATORVASTATIN CALCIUM 10 MG PO TABS: 20.0000 mg | ORAL_TABLET | Freq: Every day | ORAL | Status: DC

## 2018-09-07 MED ORDER — LACTATED RINGERS IV SOLN
INTRAVENOUS | Status: DC
  Administered 2018-09-07: 11:00:00 via INTRAVENOUS

## 2018-09-07 MED ORDER — CYANOCOBALAMIN 1000 MCG/ML IJ SOLN: 1000.0000 ug | INTRAMUSCULAR | Status: DC

## 2018-09-07 MED ORDER — CITALOPRAM HYDROBROMIDE 20 MG PO TABS: 40.0000 mg | ORAL_TABLET | Freq: Every day | ORAL | Status: DC

## 2018-09-07 MED ORDER — BUTAMBEN-TETRACAINE-BENZOCAINE 2-2-14 % EX AERO
INHALATION_SPRAY | CUTANEOUS | Status: DC | PRN
  Administered 2018-09-07: 2 via TOPICAL

## 2018-09-07 MED ORDER — LOPERAMIDE HCL 2 MG PO CAPS: 2.0000 mg | ORAL_CAPSULE | ORAL | 0 refills | Status: DC | PRN

## 2018-09-07 MED ORDER — FENTANYL CITRATE (PF) 100 MCG/2ML IJ SOLN
INTRAMUSCULAR | Status: DC | PRN
  Administered 2018-09-07 (×2): 25 ug via INTRAVENOUS

## 2018-09-07 MED ORDER — MOMETASONE FURO-FORMOTEROL FUM 100-5 MCG/ACT IN AERO: 2.0000 | INHALATION_SPRAY | Freq: Two times a day (BID) | RESPIRATORY_TRACT | 0 refills | Status: DC

## 2018-09-07 MED ORDER — MIDAZOLAM HCL (PF) 10 MG/2ML IJ SOLN
INTRAMUSCULAR | Status: DC | PRN
  Administered 2018-09-07 (×2): 2 mg via INTRAVENOUS

## 2018-09-07 MED ORDER — GABAPENTIN 100 MG PO CAPS: 100.0000 mg | ORAL_CAPSULE | Freq: Three times a day (TID) | ORAL | Status: DC

## 2018-09-07 MED ORDER — MIDAZOLAM HCL (PF) 5 MG/ML IJ SOLN
INTRAMUSCULAR | Status: AC
  Filled 2018-09-07: qty 2

## 2018-09-07 MED ORDER — GABAPENTIN 100 MG PO CAPS: 100.0000 mg | ORAL_CAPSULE | Freq: Three times a day (TID) | ORAL | 0 refills | Status: DC

## 2018-09-07 NOTE — Discharge Summary (Signed)
Discharge Summary  Michael Hines KYH:[id number] DOB: [birth year]  PCP: Finis Bud, MD  Admit date: 09/01/2018 Discharge date: 09/07/2018  Time spent: 35 minutes  Recommendations for Outpatient Follow-up:  1. Call within 48 hours for follow-up with your primary care provider regarding low random cortisol level 2. Follow-up with infectious disease 3. Follow-up with your cardiologist 4. Take your medications as prescribed 5. Continue physical therapy 6. Fall precautions  Discharge Diagnoses:  Active Hospital Problems   Diagnosis Date Noted  . Sepsis due to undetermined organism ([place]) 09/01/2018  . Bacteremia   . Diabetes mellitus without complication ([place]) 15/17/6160  . Hyponatremia 09/01/2018  . Polycythemia 09/01/2018  . Abnormal LFTs 09/01/2018  . Hypophosphatemia 09/01/2018  . Altered mental status 09/01/2018    Resolved Hospital Problems  No resolved problems to display.    Diet recommendation: Resume previous diet  Vitals:   09/07/18 1250 09/07/18 1300  BP: 102/73 (!) 99/50  Pulse: (!) 56 (!) 25  Resp: 18 18  Temp:    SpO2: 93% 94%    History of present illness:  Michael Schmelzle Simmonsis a 73 y.o.malewith medical history significant ofanxiety, B12 deficiency, back pain, type 2 diabetes, hyperlipidemia, testosterone insufficiency, aortic insufficiency with a history of aortic valve replacement who is coming to the emergency department with altered mental status and fever 3 days after receiving the influenza vaccine. TRH asked to admit for sepsis due to undetermined organism.  Infectious disease consulted and following.  Hospital course complicated by persistent diarrhea.  GI panel and C. difficile PCR negative.  Was started on IV Imodium as needed which he tolerated well with improvement of his diarrhea.  Also complicated by CoNS bacteremia in 1 set of blood cx from admit, given that he has AVR - recommend to get TEE to help decide course of treatment.  TEE  done on 09/07/2018 which was negative for valvular vegetation.  09/07/2018: Patient seen and examined at his bedside.  No acute events overnight.  No new complaints.  Had TEE done today which was negative for valvular vegetation.  Okay to discharge per infectious disease standpoint.  On the day of discharge, the patient was hemodynamically stable.  He will need to follow-up with his primary care provider and infectious disease posthospitalization.  He will also need to take his medications as prescribed.    Hospital Course:  Principal Problem:   Sepsis due to undetermined organism [facility]) Active Problems:   Diabetes mellitus without complication (HCC)   Hyponatremia   Polycythemia   Abnormal LFTs   Hypophosphatemia   Altered mental status   Bacteremia  Resolving sepsis secondary to gram-positive cocci bacteremia versus others Presented with fever with T-max of 104.4, tachycardia with heart rate of 116, tachypnea with respiration rate of 30, hypotension requiring boluses of IV fluids to maintain a map greater than 65 Blood cultures drawn on 09/01/2018 revealed gram-positive cocci Completed 7 days of IV antibiotics.  Okay to discharge with antibiotics per infectious disease Follow-up with infectious disease outpatient No valvular vegetation on TEE done on 09/07/2018 Repeat blood cultures x2 peripherally negative to date  Leukocytosis, suspect from hemoconcentration No sign of active infective process  Resolving intermittent hypotension/low random cortisol level  Ordered random cortisol level to rule out adrenal insufficiency, low at 6.4  Has been maintaining a map above 65 without treatment No hyponatremia or hyperkalemia Follow-up with your primary care provider; call within 48 hours for post hospital follow-up appointment  Chronic intermittent bradycardia Asymptomatic Followed by  cardiology outpatient for his chronic sinus bradycardia Avoid beta-blockers and calcium channel  blocker Follow-up with your cardiologist outpatient  Resolved hypokalemia post repletion   Resolved acute metabolic encephalopathy suspect secondary to sepsis versus others Alert and oriented x3  Diarrhea, improving GI panel and cdiff pcr negative Continue Imodium as needed  Type 2 diabetes On glipizide at home Hemoglobin A1c 7.4 on 09/04/2018 Resume home antidiabetic medications   Code Status:Full code.  Consults called:Infectious diseases Karolee Ohs, MD), cardiology for TEE.    Discharge Exam: BP (!) 99/50   Pulse (!) 25   Temp 98.3 F (36.8 C) (Oral)   Resp 18   Ht 5' 5.5" (1.664 m)   Wt 98.3 kg   SpO2 94%   BMI 35.51 kg/m  . General: 73 y.o. year-old male well developed well nourished in no acute distress.  Alert and oriented x3. . Cardiovascular: Regular rate and rhythm with no rubs or gallops.  No thyromegaly or JVD noted.   Marland Kitchen Respiratory: Clear to auscultation with no wheezes or rales. Good inspiratory effort. . Abdomen: Soft nontender nondistended with normal bowel sounds x4 quadrants. . Musculoskeletal: No lower extremity edema. 2/4 pulses in all 4 extremities. . Skin: No ulcerative lesions noted or rashes, . Psychiatry: Mood is appropriate for condition and setting  Discharge Instructions You were cared for by a hospitalist during your hospital stay. If you have any questions about your discharge medications or the care you received while you were in the hospital after you are discharged, you can call the unit and asked to speak with the hospitalist on call if the hospitalist that took care of you is not available. Once you are discharged, your primary care physician will handle any further medical issues. Please note that NO REFILLS for any discharge medications will be authorized once you are discharged, as it is imperative that you return to your primary care physician (or establish a relationship with a primary care physician if you do not have  one) for your aftercare needs so that they can reassess your need for medications and monitor your lab values.   Allergies as of 09/07/2018   No Known Allergies     Medication List    STOP taking these medications   buPROPion 300 MG 24 hr tablet Commonly known as:  WELLBUTRIN XL   HYDROcodone-acetaminophen 10-325 MG tablet Commonly known as:  NORCO     TAKE these medications   atorvastatin 20 MG tablet Commonly known as:  LIPITOR Take 20 mg by mouth at bedtime.   citalopram 40 MG tablet Commonly known as:  CELEXA Take 40 mg by mouth daily.   cyanocobalamin 1000 MCG/ML injection Commonly known as:  (VITAMIN B-12) Inject 1,000 mcg into the muscle See admin instructions. Inject 59mL intramuscularly weekly for four weeks. Then 70mL intramuscularly monthly thereafter.   gabapentin 100 MG capsule Commonly known as:  NEURONTIN Take 1 capsule (100 mg total) by mouth 3 (three) times daily. What changed:    medication strength  how much to take  when to take this  additional instructions   glipiZIDE 10 MG tablet Commonly known as:  GLUCOTROL Take 10 mg by mouth daily.   loperamide 2 MG capsule Commonly known as:  IMODIUM Take 1 capsule (2 mg total) by mouth as needed for diarrhea or loose stools.   mometasone-formoterol 100-5 MCG/ACT Aero Commonly known as:  DULERA Inhale 2 puffs into the lungs 2 (two) times daily.   testosterone cypionate 200 MG/ML injection  Commonly known as:  DEPOTESTOSTERONE CYPIONATE Inject 1 mL into the muscle every 14 (fourteen) days.      No Known Allergies Follow-up Information    Finis Bud, MD. Call in 1 day(s).   Specialty:  Family Medicine Why:  Call within 48 hours for a post hospital follow-up appointment Contact information: [address] [place] [address] [phone]        Carlyle Basques, MD. Call.   Specialty:  Infectious Diseases Why:  Please call for a post hospital follow-up  appointment. Contact information: [place] [address] [phone]            The results of significant diagnostics from this hospitalization (including imaging, microbiology, ancillary and laboratory) are listed below for reference.    Significant Diagnostic Studies: Dg Chest 2 View  Result Date: 09/01/2018 CLINICAL DATA:  Suspected sepsis EXAM: CHEST - 2 VIEW COMPARISON:  06/25/2015 FINDINGS: Normal heart size. Aortic tortuosity. Prior median sternotomy for aortic valve replacement. Interstitial coarsening similar to prior. There is no edema, consolidation, effusion, or pneumothorax. Artifact from EKG leads. IMPRESSION: No acute finding when compared to prior. Electronically Signed   By: Monte Fantasia M.D.   On: 09/01/2018 05:46   Ct Head Wo Contrast  Result Date: 09/01/2018 CLINICAL DATA:  Altered level of consciousness EXAM: CT HEAD WITHOUT CONTRAST TECHNIQUE: Contiguous axial images were obtained from the base of the skull through the vertex without intravenous contrast. COMPARISON:  None. FINDINGS: Brain: No evidence of acute infarction, hemorrhage, hydrocephalus, extra-axial collection or mass lesion/mass effect. Central predominant cerebral volume loss. 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 Vascular: No hyperdense vessel or unexpected calcification. Skull: Negative Sinuses/Orbits: Negative IMPRESSION: No acute finding. Electronically Signed   By: Monte Fantasia M.D.   On: 09/01/2018 05:40    Microbiology: Recent Results (from the past 240 hour(s))  Culture, blood (Routine x 2)     Status: Abnormal   Collection Time: 09/01/18  5:03 AM  Result Value Ref Range Status   Specimen Description   Final    BLOOD BLOOD LEFT FOREARM Performed at [facility] [address] [place], [place] [address]    Special Requests   Final    BOTTLES DRAWN AEROBIC AND ANAEROBIC Blood Culture adequate volume Performed at  [facility] [address], [place], [place] [address]    Culture  Setup Time   Final    GRAM POSITIVE COCCI AEROBIC BOTTLE ONLY CRITICAL RESULT CALLED TO, READ BACK BY AND VERIFIED WITH: PHARMD M RENZ [id number] MLM    Culture (A)  Final    STAPHYLOCOCCUS SPECIES (COAGULASE NEGATIVE) THE SIGNIFICANCE OF ISOLATING THIS ORGANISM FROM A SINGLE SET OF BLOOD CULTURES WHEN MULTIPLE SETS ARE DRAWN IS UNCERTAIN. PLEASE NOTIFY THE MICROBIOLOGY DEPARTMENT WITHIN ONE WEEK IF SPECIATION AND SENSITIVITIES ARE REQUIRED. Performed at [facility], [place] [address] [place], [place] [address]    Report Status 09/03/2018 FINAL  Final  Culture, blood (Routine x 2)     Status: None   Collection Time: 09/01/18  5:03 AM  Result Value Ref Range Status   Specimen Description   Final    BLOOD BLOOD RIGHT FOREARM Performed at [facility] [address], [place], [place] [address]    Special Requests   Final    BOTTLES DRAWN AEROBIC AND ANAEROBIC Blood Culture adequate volume Performed at [facility]  [address] [place], [place] [address]    Culture   Final    NO GROWTH 5 DAYS Performed at [facility], [place] [address], [place], [place] [address]    Report Status 09/06/2018 FINAL  Final  Blood Culture ID Panel (Reflexed)     Status: Abnormal   Collection Time: 09/01/18  5:03 AM  Result Value Ref Range Status   Enterococcus species NOT DETECTED NOT DETECTED Final   Listeria monocytogenes NOT DETECTED NOT DETECTED Final   Staphylococcus species DETECTED (A) NOT DETECTED Final    Comment: Methicillin (oxacillin) susceptible coagulase negative staphylococcus. Possible blood culture contaminant (unless isolated from more than one blood culture draw or clinical case suggests pathogenicity). No antibiotic treatment is indicated for blood  culture contaminants. CRITICAL RESULT CALLED TO, READ BACK BY AND VERIFIED WITH: Minette Brine  [id number] MLM    Staphylococcus aureus (BCID) NOT DETECTED NOT DETECTED Final   Methicillin resistance NOT DETECTED NOT DETECTED Final   Streptococcus species NOT DETECTED NOT DETECTED Final   Streptococcus agalactiae NOT DETECTED NOT DETECTED Final   Streptococcus pneumoniae NOT DETECTED NOT DETECTED Final   Streptococcus pyogenes NOT DETECTED NOT DETECTED Final   Acinetobacter baumannii NOT DETECTED NOT DETECTED Final   Enterobacteriaceae species NOT DETECTED NOT DETECTED Final   Enterobacter cloacae complex NOT DETECTED NOT DETECTED Final   Escherichia coli NOT DETECTED NOT DETECTED Final   Klebsiella oxytoca NOT DETECTED NOT DETECTED Final   Klebsiella pneumoniae NOT DETECTED NOT DETECTED Final   Proteus species NOT DETECTED NOT DETECTED Final   Serratia marcescens NOT DETECTED NOT DETECTED Final   Haemophilus influenzae NOT DETECTED NOT DETECTED Final   Neisseria meningitidis NOT DETECTED NOT DETECTED Final   Pseudomonas aeruginosa NOT DETECTED NOT DETECTED Final   Candida albicans NOT DETECTED NOT DETECTED Final   Candida glabrata NOT DETECTED NOT DETECTED Final   Candida krusei NOT DETECTED NOT DETECTED Final   Candida parapsilosis NOT DETECTED NOT DETECTED Final   Candida tropicalis NOT DETECTED NOT DETECTED Final    Comment: Performed at [facility], [address] [place], [place] [address]  Urine culture     Status: None   Collection Time: 09/01/18  5:07 AM  Result Value Ref Range Status   Specimen Description   Final    URINE, CATHETERIZED Performed at [facility] [address] [place], [place] [address]    Special Requests   Final    NONE Performed at [facility], [place] [address], [place], [place] [address]    Culture   Final    NO GROWTH Performed at [facility], [place] [address] [place], [place] [address]    Report Status 09/02/2018 FINAL  Final  CSF culture     Status: None   Collection Time: 09/01/18   6:43 AM  Result Value Ref Range Status   Specimen Description   Final    CSF Performed at [facility] [address], [place], [place] [address]    Special Requests   Final    NONE Performed at [facility], [place] [address], [place],  [address]    Gram Stain   Final    WBC PRESENT, PREDOMINANTLY MONONUCLEAR NO ORGANISMS SEEN CYTOSPIN SMEAR Gram Stain Report Called to,Read Back By and Verified With: SMITH,T. RN @0738  ON 1.22.2020 BY [email or web address]    Culture   Final    NO GROWTH 3 DAYS Performed at [facility], [place] [address], [place], [place] [address]    Report Status 09/04/2018 FINAL  Final  MRSA PCR Screening     Status: None   Collection Time: 09/01/18  7:55 AM  Result Value Ref Range Status   MRSA by PCR NEGATIVE NEGATIVE Final    Comment:        The GeneXpert MRSA Assay (FDA approved for NASAL specimens only), is one component of a comprehensive MRSA colonization surveillance program. It is not intended to diagnose MRSA infection nor to guide or monitor treatment for MRSA infections. Performed at [facility], [place] [address], [place], [place] [address]   Gastrointestinal Panel by PCR , Stool     Status: None   Collection Time: 09/02/18  8:54 AM  Result Value Ref Range Status   Campylobacter species NOT DETECTED NOT DETECTED Final   Plesimonas shigelloides NOT DETECTED NOT DETECTED Final   Salmonella species NOT DETECTED NOT DETECTED Final   Yersinia enterocolitica NOT DETECTED NOT DETECTED Final   Vibrio species NOT DETECTED NOT DETECTED Final   Vibrio cholerae NOT DETECTED NOT DETECTED Final   Enteroaggregative E coli (EAEC) NOT DETECTED NOT DETECTED Final   Enteropathogenic E coli (EPEC) NOT DETECTED NOT DETECTED Final   Enterotoxigenic E coli (ETEC) NOT DETECTED NOT DETECTED Final   Shiga like toxin producing E coli (STEC) NOT DETECTED NOT DETECTED Final   Shigella/Enteroinvasive E coli (EIEC)  NOT DETECTED NOT DETECTED Final   Cryptosporidium NOT DETECTED NOT DETECTED Final   Cyclospora cayetanensis NOT DETECTED NOT DETECTED Final   Entamoeba histolytica NOT DETECTED NOT DETECTED Final   Giardia lamblia NOT DETECTED NOT DETECTED Final   Adenovirus F40/41 NOT DETECTED NOT DETECTED Final   Astrovirus NOT DETECTED NOT DETECTED Final   Norovirus GI/GII NOT DETECTED NOT DETECTED Final   Rotavirus A NOT DETECTED NOT DETECTED Final   Sapovirus (I, II, IV, and V) NOT DETECTED NOT DETECTED Final    Comment: Performed at [facility], [place] [place], [place] [address]  C difficile quick scan w PCR reflex     Status: None   Collection Time: 09/02/18  3:32 PM  Result Value Ref Range Status   C Diff antigen NEGATIVE NEGATIVE Final   C Diff toxin NEGATIVE NEGATIVE Final   C Diff interpretation No C. difficile detected.  Final    Comment: Performed at [facility], [place] [address], [place], [place] [address]  Culture, blood (routine x 2)     Status: None (Preliminary result)   Collection Time: 09/03/18  3:24 PM  Result Value Ref Range Status   Specimen Description   Final    BLOOD LEFT HAND Performed at [facility] [address], [place], [place] [address]    Special Requests   Final    BOTTLES DRAWN AEROBIC ONLY Blood Culture adequate volume Performed at [facility] [address], [place], [place] [address]    Culture   Final    NO GROWTH 4 DAYS Performed at [facility], [place] [address], [place], [place] [address]    Report Status PENDING  Incomplete  Culture, blood (routine x 2)     Status: None (Preliminary result)   Collection Time: 09/03/18  3:29 PM  Result Value Ref Range Status   Specimen Description   Final    BLOOD RIGHT ANTECUBITAL Performed at [facility] [address], [place], [place] [address]  Special Requests   Final    BOTTLES DRAWN AEROBIC AND  ANAEROBIC Blood Culture adequate volume Performed at [facility] [address] [place], [place] [address]    Culture   Final    NO GROWTH 4 DAYS Performed at [facility], [place] [address], [place], [place] [address]    Report Status PENDING  Incomplete     Labs: Basic Metabolic Panel: Recent Labs  Lab 09/01/18 0501  09/03/18 0259 09/04/18 0249 09/05/18 1018 09/06/18 0546 09/07/18 0607  NA 131*   < > 137 137 136 136 137  K 3.8   < > 3.5 3.1* 3.4* 3.5 3.5  CL 99   < > 107 107 102 103 102  CO2 19*   < > 22 21* 28 24 24   GLUCOSE 176*   < > 115* 117* 175* 131* 139*  BUN 25*   < > 14 9 7* 5* 8  CREATININE 0.98   < > 0.95 0.88  0.93 0.91 0.86 0.99  CALCIUM 8.6*   < > 8.1* 8.0* 8.3* 8.5* 8.6*  MG 2.2  --   --   --   --   --   --   PHOS 2.2*  --   --   --   --   --   --    < > = values in this interval not displayed.   Liver Function Tests: Recent Labs  Lab 09/01/18 0501 09/02/18 0531 09/03/18 0259  AST 46* 36 35  ALT 57* 41 38  ALKPHOS 72 46 52  BILITOT 2.0* 1.3* 0.9  PROT 7.0 4.9* 5.5*  ALBUMIN 3.4* 2.4* 2.7*   No results for input(s): LIPASE, AMYLASE in the last 168 hours. No results for input(s): AMMONIA in the last 168 hours. CBC: Recent Labs  Lab 09/03/18 0259 09/04/18 0249 09/05/18 1018 09/06/18 0546 09/07/18 0607  WBC 13.3* 10.3 12.4* 14.9* 15.9*  NEUTROABS 10.2* 7.0 9.3* 11.1* 11.6*  HGB 14.6 13.6 15.7 16.6 17.2*  HCT 45.1 42.1 47.6 51.2 53.1*  MCV 93.4 92.9 91.7 93.8 91.6  PLT 180 179 229 232 287   Cardiac Enzymes: Recent Labs  Lab 09/01/18 1129 09/01/18 1647  TROPONINI 0.03* 0.03*   BNP: BNP (last 3 results) No results for input(s): BNP in the last 8760 hours.  ProBNP (last 3 results) No results for input(s): PROBNP in the last 8760 hours.  CBG: Recent Labs  Lab 09/05/18 2021 09/06/18 0740 09/06/18 1157 09/06/18 1700 09/06/18 1952  GLUCAP 125* 147* 165* 142* 123*       Signed:  Kayleen Memos,  MD [facility] 09/07/2018, 3:54 PM

## 2018-09-07 NOTE — Interval H&P Note (Signed)
History and Physical Interval Note:  09/07/2018 10:50 AM  Michael Hines  has presented today for surgery, with the diagnosis of BACTEREMIA  The various methods of treatment have been discussed with the patient and family. After consideration of risks, benefits and other options for treatment, the patient has consented to  Procedure(s): TRANSESOPHAGEAL ECHOCARDIOGRAM (TEE) (N/A) as a surgical intervention .  The patient's history has been reviewed, patient examined, no change in status, stable for surgery.  I have reviewed the patient's chart and labs.  Questions were answered to the patient's satisfaction.     Mertie Moores

## 2018-09-07 NOTE — CV Procedure (Signed)
    Transesophageal Echocardiogram Note  Michael Hines [id number] 10-06-1945  Procedure: Transesophageal Echocardiogram Indications: bacteremia   Procedure Details Consent: Obtained Time Out: Verified patient identification, verified procedure, site/side was marked, verified correct patient position, special equipment/implants available, Radiology Safety Procedures followed,  medications/allergies/relevent history reviewed, required imaging and test results available.  Performed  Medications:  During this procedure the patient is administered a total of Versed 4 mg and Fentanyl 50 mcg  to achieve and maintain moderate conscious sedation.  The patient's heart rate, blood pressure, and oxygen saturation are monitored continuously during the procedure. The period of conscious sedation is 30  minutes, of which I was present face-to-face 100% of this time.  Left Ventrical:  Normal LV function   Mitral Valve: no vegetation   Aortic Valve: bio-prosthetic AV, no vegetation   Tricuspid Valve: no vegetation   Pulmonic Valve: not well visualized   Left Atrium/ Left atrial appendage: not visualized    Atrial septum:  No ASD or PFO by color doppler   Aorta: mild calcification    Complications: No apparent complications Patient did tolerate procedure well.   Thayer Headings, Brooke Bonito., MD, [facility] 09/07/2018, 12:33 PM

## 2018-09-07 NOTE — Care Management Note (Addendum)
Case Management Note  Patient Details  Name: Michael Hines MRN: [id number] Date of Birth: 1946-03-05  Subjective/Objective:                  discharged  Action/Plan: Home with PT through [facility] List of choices given to wife and patient, and placed in shadow chart. Expected Discharge Date:  (unknown)               Expected Discharge Plan:  [place]  In-House Referral:     Discharge planning Services  CM Consult  Post Acute Care Choice:    Choice offered to:  Patient  DME Arranged:    DME Agency:     HH Arranged:  PT [place]:  [place]  Status of Service:  Completed, signed off  If discussed at [place] of Stay Meetings, dates discussed:    Additional Comments:  Leeroy Cha, RN 09/07/2018, 3:31 PM

## 2018-09-07 NOTE — Evaluation (Signed)
Physical Therapy Evaluation-1x Patient Details Name: Michael Hines MRN: [id number] DOB: Dec 26, 1945 Today's Date: 09/07/2018   History of Present Illness  73 yo male admitted with AMS, fevers. Dx: sepsis. Hx of back pain DM aortic insufficiency-s/p AVR  Clinical Impression  On eval, pt was Supv-Mod Ind with mobility. He walked ~100 feet around the unit. No LOB. Pt tolerated activity well. Both pt and wife deny need for HHPT f/u (ordered by MD). 1x eval. No PT needs. Will sign off.     Follow Up Recommendations No PT follow up(Pt and family do not want any f/u)    Equipment Recommendations       Recommendations for Other Services       Precautions / Restrictions Precautions Precautions: None Restrictions Weight Bearing Restrictions: No      Mobility  Bed Mobility               General bed mobility comments: oob sitting in armchair  Transfers Overall transfer level: Modified independent                  Ambulation/Gait Ambulation/Gait assistance: Supervision Gait Distance (Feet): 100 Feet Assistive device: None       General Gait Details: mildly unsteady intermittently. No LOB.   Stairs            Wheelchair Mobility    Modified Rankin (Stroke Patients Only)       Balance                                             Pertinent Vitals/Pain Pain Assessment: No/denies pain    Home Living Family/patient expects to be discharged to:: Private residence Living Arrangements: Spouse/significant other             Home Equipment: None      Prior Function Level of Independence: Independent               Hand Dominance        Extremity/Trunk Assessment   Upper Extremity Assessment Upper Extremity Assessment: Overall WFL for tasks assessed    Lower Extremity Assessment Lower Extremity Assessment: Overall WFL for tasks assessed    Cervical / Trunk Assessment Cervical / Trunk Assessment: Normal   Communication   Communication: No difficulties  Cognition Arousal/Alertness: Awake/alert Behavior During Therapy: WFL for tasks assessed/performed Overall Cognitive Status: Within Functional Limits for tasks assessed                                        General Comments      Exercises     Assessment/Plan    PT Assessment Patent does not need any further PT services  PT Problem List         PT Treatment Interventions      PT Goals (Current goals can be found in the Care Plan section)  Acute Rehab PT Goals Patient Stated Goal: home today. get to the beach in a couple of weeks. PT Goal Formulation: All assessment and education complete, DC therapy    Frequency     Barriers to discharge        Co-evaluation               AM-PAC PT "6 Clicks" Mobility  Outcome Measure Help  needed turning from your back to your side while in a flat bed without using bedrails?: None Help needed moving from lying on your back to sitting on the side of a flat bed without using bedrails?: None Help needed moving to and from a bed to a chair (including a wheelchair)?: None Help needed standing up from a chair using your arms (e.g., wheelchair or bedside chair)?: None Help needed to walk in hospital room?: None Help needed climbing 3-5 steps with a railing? : A Little 6 Click Score: 23    End of Session   Activity Tolerance: Patient tolerated treatment well Patient left: in chair;with call bell/phone within reach;with family/visitor present        Time: 1527-1540 PT Time Calculation (min) (ACUTE ONLY): 13 min   Charges:   PT Evaluation $PT Eval Moderate Complexity: [place], PT Acute Rehabilitation Services Pager: [phone] Office: [phone]

## 2018-09-07 NOTE — Care Management Important Message (Signed)
Important Message  Patient Details  Name: Michael Hines MRN: [id number] Date of Birth: 29-Nov-1945   Medicare Important Message Given:  Yes    Kerin Salen 09/07/2018, 11:04 AMImportant Message  Patient Details  Name: Michael Hines MRN: [id number] Date of Birth: [birth year]   Medicare Important Message Given:  Yes    Kerin Salen 09/07/2018, 11:04 AM

## 2018-09-07 NOTE — Progress Notes (Signed)
  Echocardiogram Echocardiogram Transesophageal has been performed.  Michael Hines G Francesa Eugenio 09/07/2018, 1:06 PM

## 2018-09-07 NOTE — Progress Notes (Signed)
    [facility] for Infectious Disease    Date of Admission:  09/01/2018   Total days of antibiotics 7   ID: Michael Hines is a 73 y.o. male with   Principal Problem:   Sepsis due to undetermined organism [facility]) Active Problems:   Diabetes mellitus without complication ([place])   Hyponatremia   Polycythemia   Abnormal LFTs   Hypophosphatemia   Altered mental status   Bacteremia    Subjective: Afebrile. TEE did not show any valves. He hasn't had much back pain  Medications:  . atorvastatin  20 mg Oral QHS  . citalopram  40 mg Oral Daily  . cyanocobalamin  1,000 mcg Intramuscular See admin instructions  . enoxaparin (LOVENOX) injection  40 mg Subcutaneous Q24H  . gabapentin  100 mg Oral TID  . insulin aspart  0-5 Units Subcutaneous QHS  . insulin aspart  0-9 Units Subcutaneous TID WC  . mouth rinse  15 mL Mouth Rinse BID  . mometasone-formoterol  2 puff Inhalation BID    Objective: Vital signs in last 24 hours: Temp:  [97.5 F (36.4 C)-98.3 F (36.8 C)] 98.3 F (36.8 C) (01/28 1239) Pulse Rate:  [25-105] 25 (01/28 1300) Resp:  [14-24] 18 (01/28 1300) BP: (99-132)/(50-102) 99/50 (01/28 1300) SpO2:  [91 %-98 %] 94 % (01/28 1300) Physical Exam  Constitutional: He is oriented to person, place, and time. He appears well-developed and well-nourished. No distress.  HENT:  Mouth/Throat: Oropharynx is clear and moist. No oropharyngeal exudate.  Cardiovascular: Normal rate, regular rhythm and normal heart sounds. Exam reveals no gallop and no friction rub.  No murmur heard.  Pulmonary/Chest: Effort normal and breath sounds normal. No respiratory distress. He has no wheezes.  Abdominal: Soft. Bowel sounds are normal. He exhibits no distension. There is no tenderness.  Lymphadenopathy:  He has no cervical adenopathy.  Neurological: He is alert and oriented to person, place, and time.  Skin: Skin is warm and dry. No rash noted. No erythema.  Psychiatric: He has a normal  mood and affect. His behavior is normal.     Lab Results Recent Labs    09/06/18 0546 09/07/18 0607  WBC 14.9* 15.9*  HGB 16.6 17.2*  HCT 51.2 53.1*  NA 136 137  K 3.5 3.5  CL 103 102  CO2 24 24  BUN 5* 8  CREATININE 0.86 0.99    Microbiology: 1/24 blood cx ngtd Studies/Results: No results found.   Assessment/Plan: Bacteremia = concern that he had CoNS bacteremia that caused his presenting illness. TEE is negative. Quickly improved.   Leukocytosis = likely due to hemoconcentrated over the last few days has steady increase. He hasn't had any clinical symptoms to suggest ongiong infection  I suspect his admission was likely viral like presentation  Will see him back in clinic in 7-10d  [facility]ious [facility] Cell: [phone] Pager: [phone]  09/07/2018, 5:04 PM

## 2018-09-07 NOTE — Discharge Instructions (Signed)
Confusion °Confusion is the inability to think with the usual speed or clarity. People who are confused often describe their thinking as cloudy or unclear. Confusion can also include feeling disoriented. This means you are unaware of where you are or who you are. You may also not know the date or time. When confused, you may have difficulty remembering, paying attention, or making decisions. Some people also act aggressively when they are confused. °In some cases, confusion may come on quickly. In other cases, it may develop slowly over time. How quickly confusion comes on depends on the cause. °Confusion may be caused by: °· Head injury (concussion). °· Seizures. °· Stroke. °· Fever. °· Brain tumor. °· Decrease in brain function due to a vascular or neurologic condition (dementia). °· Emotions, like rage or terror. °· Inability to know what is real and what is not (hallucinations). °· Infections, such as a urinary tract infection (UTI). °· Using too much alcohol, drugs, or medicines. °· Loss of fluid (dehydration) or an imbalance of salts in the body (electrolytes). °· Lack of sleep. °· Low blood sugar (diabetes). °· Low levels of oxygen. This comes from conditions such as chronic lung disorders. °· Side effects of medicines, or taking medicines that affect other medicines (drug interactions). °· Lack of certain nutrients, especially niacin, thiamine, vitamin C, or vitamin B. °· Sudden drop in body temperature (hypothermia). °· Change in routine, such as traveling or being hospitalized. °Follow these instructions at home: °Pay attention to your symptoms. Tell your health care provider about any changes or if you develop new symptoms. Follow these instructions to control or treat symptoms. Ask a family member or friend for help if needed. °Medicines °· Take over-the-counter and prescription medicines only as told by your health care provider. °· Ask your health care provider about changing or stopping any medicines  that may be causing your confusion. °· Avoid pain medicines or sleep medicines until you have fully recovered. °· Use a pillbox or an alarm to help you take the right medicines at the right time. °Lifestyle ° °· Eat a balanced diet that includes fruits and vegetables. °· Get enough sleep. For most adults, this is 7-9 hours each night. °· Do not drink alcohol. °· Do not become isolated. Spend time with other people and make plans for your days. °· Do not drive until your health care provider says that it is safe to do so. °· Do not use any products that contain nicotine or tobacco, such as cigarettes and e-cigarettes. If you need help quitting, ask your health care provider. °· Stop other activities that may increase your chances of getting hurt. These may include some work duties, sports activities, swimming, or bike riding. Ask your health care provider what activities are safe for you. °What caregivers can do °· Find out if the person is confused. Ask the person to state his or her name, age, and the date. If the person is unsure or answers incorrectly, he or she may be confused. °· Always introduce yourself, no matter how well the person knows you. °· Remind the person of his or her location. Do this often. °· Place a calendar and clock near the person who is confused. °· Talk about current events and plans for the day. °· Keep the environment calm, quiet, and peaceful. °· Help the person do the things that he or she is unable to do. These include: °? Taking medicines. °? Keeping follow-up visits with his or her health care   provider. ? Helping with household duties, including meal preparation. ? Running errands.  Get help if you need it. There are several support groups for caregivers.  If the person you are helping needs more support, consider day care, extended care programs, or a skilled nursing facility. The person's health care provider may be able to help evaluate these options. General  instructions  Monitor yourself for any conditions you may have. These may include: ? Checking your blood glucose levels, if you have diabetes. ? Watching your weight, if you are overweight. ? Monitoring your blood pressure, if you have hypertension. ? Monitoring your body temperature, if you have a fever.  Keep all follow-up visits as told by your health care provider. This is important. Contact a health care provider if:  Your symptoms get worse. Get help right away if you:  Feel that you are not able to care for yourself.  Develop severe headaches, repeated vomiting, seizures, blackouts, or slurred speech.  Have increasing confusion, weakness, numbness, restlessness, or personality changes.  Develop a loss of balance, have marked dizziness, feel uncoordinated, or fall.  Develop severe anxiety, or you have delusions or hallucinations. These symptoms may represent a serious problem that is an emergency. Do not wait to see if the symptoms will go away. Get medical help right away. Call your local emergency services (911 in the [place] Do not drive yourself to the hospital. Summary  Confusion is the inability to think with the usual speed or clarity. People who are confused often describe their thinking as cloudy or unclear.  Confusion can also include having difficulty remembering, paying attention, or making decisions.  Confusion may come on quickly or develop slowly over time, depending on the cause. There are many different causes of confusion.  Ask for help from family members or friends if you are unable to take care of yourself. This information is not intended to replace advice given to you by your health care provider. Make sure you discuss any questions you have with your health care provider. Document Released: 09/04/2004 Document Revised: 07/30/2017 Document Reviewed: 07/30/2017 Elsevier Interactive Patient Education  2019 [employer]   Sepsis, Diagnosis,  Adult Sepsis is a serious bodily reaction to an infection. The infection that triggers sepsis may be from a bacteria, virus, or fungus. Sepsis can result from an infection in any part of your body. Infections that commonly lead to sepsis include skin, lung, and urinary tract infections. Sepsis is a medical emergency that must be treated right away in a hospital. In severe cases, it can lead to septic shock. Septic shock can weaken your heart and cause your blood pressure to drop. This can cause your central nervous system and your body's organs to stop working. What are the causes? This condition is caused by a severe reaction to infections from bacteria, viruses, or fungus. The germs that most often lead to sepsis include:  Escherichia coli (E. coli) bacteria.  Staphylococcus aureus (staph) bacteria.  Some types of Streptococcus bacteria. The most common infections affect these organs:  The lung (pneumonia).  The kidneys or bladder (urinary tract infection).  The skin (cellulitis).  The bowel, gallbladder, or pancreas. What increases the risk? You are more likely to develop this condition if:  Your body's disease-fighting system (immune system) is weakened.  You are age 54 or older.  You are male.  You had surgery or you have been hospitalized.  You have these devices inserted into your body: ? A small, thin tube (  catheter). ? IV line. ? Breathing tube. ? Drainage tube.  You are not getting enough nutrients from food (malnourished).  You have a long-term (chronic) disease, such as cancer, lung disease, kidney disease, or diabetes.  You are African American. What are the signs or symptoms? Symptoms of this condition may include:  Fever.  Chills or feeling very cold.  Confusion or anxiety.  Fatigue.  Muscle aches.  Shortness of breath.  Nausea and vomiting.  Urinating much less than usual.  Fast heart rate (tachycardia).  Rapid breathing  (hyperventilation).  Changes in skin color. Your skin may look blotchy, pale, or blue.  Cool, clammy, or sweaty skin.  Skin rash. Other symptoms depend on the source of your infection. How is this diagnosed? This condition is diagnosed based on:  Your symptoms.  Your medical history.  A physical exam. Other tests may also be done to find out the cause of the infection and how severe the sepsis is. These tests may include:  Blood tests.  Urine tests.  Swabs from other areas of your body that may have an infection. These samples may be tested (cultured) to find out what type of bacteria is causing the infection.  Chest X-ray to check for pneumonia. Other imaging tests, such as a CT scan, may also be done.  Lumbar puncture. This removes a small amount of the fluid that surrounds your brain and spinal cord. The fluid is then examined for infection. How is this treated? This condition must be treated in a hospital. Based on the cause of your infection, you may be given an antibiotic, antiviral, or antifungal medicine. You may also receive:  Fluids through an IV.  Oxygen and breathing assistance.  Medicines to increase your blood pressure.  Kidney dialysis. This process cleans your blood if your kidneys have failed.  Surgery to remove infected tissue.  Blood transfusion if needed.  Medicine to prevent blood clots.  Nutrients to correct imbalances in basic body function (metabolism). You may: ? Receive important salts and minerals (electrolytes) through an IV. ? Have your blood sugar level adjusted. Follow these instructions at home: Medicines   Take over-the-counter and prescription medicines only as told by your health care provider.  If you were prescribed an antibiotic, antiviral, or antifungal medicine, take it as told by your health care provider. Do not stop taking the medicine even if you start to feel better. General instructions  If you have a catheter or  other indwelling device, ask to have it removed as soon as possible.  Keep all follow-up visits as told by your health care provider. This is important. Contact a health care provider if:  You do not feel like you are getting better or regaining strength.  You are having trouble coping with your recovery.  You frequently feel tired.  You feel worse or do not seem to get better after surgery.  You think you may have an infection after surgery. Get help right away if:  You have any symptoms of sepsis.  You have difficulty breathing.  You have a rapid or skipping heartbeat.  You become confused or disoriented.  You have a high fever.  Your skin becomes blotchy, pale, or blue.  You have an infection that is getting worse or not getting better. These symptoms may represent a serious problem that is an emergency. Do not wait to see if the symptoms will go away. Get medical help right away. Call your local emergency services (911 in the [place]  Do not drive yourself to the hospital. Summary  Sepsis is a medical emergency that requires immediate treatment in a hospital.  This condition is caused by a severe reaction to infections from bacteria, viruses, or fungus.  Based on the cause of your infection, you may be given an antibiotic, antiviral, or antifungal medicine.  Treatment may also include IV fluids, breathing assistance, and kidney dialysis. This information is not intended to replace advice given to you by your health care provider. Make sure you discuss any questions you have with your health care provider. Document Released: 04/26/2003 Document Revised: 03/05/2018 Document Reviewed: 03/05/2018 Elsevier Interactive Patient Education  2019 [employer].

## 2018-09-08 ENCOUNTER — Other Ambulatory Visit: Payer: Self-pay

## 2018-09-08 ENCOUNTER — Encounter (HOSPITAL_COMMUNITY): Payer: Self-pay | Admitting: *Deleted

## 2018-09-08 ENCOUNTER — Emergency Department (HOSPITAL_COMMUNITY)
Admission: EM | Admit: 2018-09-08 | Discharge: 2018-09-09 | Disposition: A | Discharge status: Home / Self Care | Payer: Medicare Other | Source: Home / Self Care | Attending: Emergency Medicine | Admitting: Emergency Medicine

## 2018-09-08 DIAGNOSIS — Z7984 Long term (current) use of oral hypoglycemic drugs: Secondary | ICD-10-CM

## 2018-09-08 DIAGNOSIS — E11649 Type 2 diabetes mellitus with hypoglycemia without coma: Secondary | ICD-10-CM

## 2018-09-08 DIAGNOSIS — R000 Tachycardia, unspecified: Secondary | ICD-10-CM | POA: Diagnosis not present

## 2018-09-08 DIAGNOSIS — E161 Other hypoglycemia: Secondary | ICD-10-CM | POA: Diagnosis not present

## 2018-09-08 DIAGNOSIS — Z87891 Personal history of nicotine dependence: Secondary | ICD-10-CM | POA: Insufficient documentation

## 2018-09-08 DIAGNOSIS — Z7982 Long term (current) use of aspirin: Secondary | ICD-10-CM | POA: Insufficient documentation

## 2018-09-08 DIAGNOSIS — Z79899 Other long term (current) drug therapy: Secondary | ICD-10-CM

## 2018-09-08 DIAGNOSIS — R4182 Altered mental status, unspecified: Secondary | ICD-10-CM | POA: Diagnosis not present

## 2018-09-08 DIAGNOSIS — E119 Type 2 diabetes mellitus without complications: Secondary | ICD-10-CM | POA: Insufficient documentation

## 2018-09-08 DIAGNOSIS — E162 Hypoglycemia, unspecified: Secondary | ICD-10-CM

## 2018-09-08 DIAGNOSIS — I499 Cardiac arrhythmia, unspecified: Secondary | ICD-10-CM | POA: Diagnosis not present

## 2018-09-08 DIAGNOSIS — A419 Sepsis, unspecified organism: Secondary | ICD-10-CM | POA: Diagnosis not present

## 2018-09-08 DIAGNOSIS — R404 Transient alteration of awareness: Secondary | ICD-10-CM | POA: Diagnosis not present

## 2018-09-08 LAB — URINALYSIS, ROUTINE W REFLEX MICROSCOPIC
Bacteria, UA: NONE SEEN
Bilirubin Urine: NEGATIVE
Glucose, UA: 50 mg/dL — AB
Ketones, ur: 5 mg/dL — AB
Leukocytes, UA: NEGATIVE
Nitrite: NEGATIVE
Protein, ur: 30 mg/dL — AB
Specific Gravity, Urine: 1.02 (ref 1.005–1.030)
pH: 6 (ref 5.0–8.0)

## 2018-09-08 LAB — CBC WITH DIFFERENTIAL/PLATELET
Abs Immature Granulocytes: 0.35 10*3/uL — ABNORMAL HIGH (ref 0.00–0.07)
Basophils Absolute: 0.1 10*3/uL (ref 0.0–0.1)
Basophils Relative: 1 %
Eosinophils Absolute: 0.2 10*3/uL (ref 0.0–0.5)
Eosinophils Relative: 1 %
HCT: 51 % (ref 39.0–52.0)
Hemoglobin: 16.3 g/dL (ref 13.0–17.0)
Immature Granulocytes: 2 %
Lymphocytes Relative: 8 %
Lymphs Abs: 1.5 10*3/uL (ref 0.7–4.0)
MCH: 29.2 pg (ref 26.0–34.0)
MCHC: 32 g/dL (ref 30.0–36.0)
MCV: 91.2 fL (ref 80.0–100.0)
Monocytes Absolute: 1 10*3/uL (ref 0.1–1.0)
Monocytes Relative: 6 %
Neutro Abs: 14.8 10*3/uL — ABNORMAL HIGH (ref 1.7–7.7)
Neutrophils Relative %: 82 %
Platelets: 269 10*3/uL (ref 150–400)
RBC: 5.59 MIL/uL (ref 4.22–5.81)
RDW: 14.2 % (ref 11.5–15.5)
WBC: 18 10*3/uL — ABNORMAL HIGH (ref 4.0–10.5)
nRBC: 0 % (ref 0.0–0.2)

## 2018-09-08 LAB — CULTURE, BLOOD (ROUTINE X 2)
Culture: NO GROWTH
Culture: NO GROWTH
Special Requests: ADEQUATE
Special Requests: ADEQUATE

## 2018-09-08 LAB — COMPREHENSIVE METABOLIC PANEL
ALT: 38 U/L (ref 0–44)
AST: 45 U/L — ABNORMAL HIGH (ref 15–41)
Albumin: 3.1 g/dL — ABNORMAL LOW (ref 3.5–5.0)
Alkaline Phosphatase: 63 U/L (ref 38–126)
Anion gap: 15 (ref 5–15)
BUN: 9 mg/dL (ref 8–23)
CO2: 20 mmol/L — ABNORMAL LOW (ref 22–32)
Calcium: 9.3 mg/dL (ref 8.9–10.3)
Chloride: 102 mmol/L (ref 98–111)
Creatinine, Ser: 1.23 mg/dL (ref 0.61–1.24)
GFR calc Af Amer: >60 mL/min (ref >60)
GFR calc non Af Amer: 59 mL/min — ABNORMAL LOW (ref >60)
Glucose, Bld: 97 mg/dL (ref 70–99)
Potassium: 4 mmol/L (ref 3.5–5.1)
Sodium: 137 mmol/L (ref 135–145)
Total Bilirubin: 1.1 mg/dL (ref 0.3–1.2)
Total Protein: 7.8 g/dL (ref 6.5–8.1)

## 2018-09-08 LAB — CBG MONITORING, ED
Glucose-Capillary: 156 mg/dL — ABNORMAL HIGH (ref 70–99)
Glucose-Capillary: 67 mg/dL — ABNORMAL LOW (ref 70–99)

## 2018-09-08 MED ORDER — SODIUM CHLORIDE 0.9 % IV BOLUS: 500.0000 mL | Freq: Once | INTRAVENOUS | Status: DC

## 2018-09-08 MED ORDER — IBUPROFEN 400 MG PO TABS
600.0000 mg | ORAL_TABLET | Freq: Once | ORAL | Status: AC
  Administered 2018-09-08: 600 mg via ORAL
  Filled 2018-09-08: qty 1

## 2018-09-08 NOTE — ED Provider Notes (Signed)
Medical screening examination/treatment/procedure(s) were conducted as a shared visit with non-physician practitioner(s) and myself.  I personally evaluated the patient during the encounter.  Patient states he does not eat much and otherwise get hypoglycemic.  He is also on lipid Zide which probably exacerbates the problem.  Patient became even hypoglycemic while he is here.  Discussed with him about admission but he stated he actually does not want to be admitted and will eat to avoid that.  Offered him one more chance to try to keep up his blood sugar with oral intake.  EKG Interpretation  Date/Time:  Wednesday September 08 2018 16:15:38 EST Ventricular Rate:  51 PR Interval:    QRS Duration: 99 QT Interval:  445 QTC Calculation: 410 R Axis:   20 Text Interpretation:  Sinus rhythm Anteroseptal infarct, age indeterminate Lateral leads are also involved improved rate from january 23 No acute changes Confirmed by Merrily Pew [phone]) on 09/08/2018 11:55:21 PM     Ryen Heitmeyer, Corene Cornea, MD 09/09/18 2055

## 2018-09-08 NOTE — ED Notes (Signed)
Pt has setled down at present  C/o being cold still but he does not have his blan kets on straight  Wife at the bedside

## 2018-09-08 NOTE — ED Notes (Signed)
Med given for a headache

## 2018-09-08 NOTE — ED Provider Notes (Signed)
[facility] EMERGENCY DEPARTMENT Provider Note   CSN: [id number] Arrival date & time: 09/08/18  1543     History   Chief Complaint Chief Complaint  Patient presents with  . Hypoglycemia    HPI Michael Hines is a 73 y.o. male.  HPI Patient presents to the emergency department with low blood sugar.  Patient was just discharged from the hospital yesterday.  The wife states that he seemed a little bit confused this afternoon so she called EMS because he was standing in the bathroom.  And would not follow her commands.  Patient states he remembers the whole incident his wife talking to him but he states that he is tired because he has not been sleeping very well since he was in the hospital and since he is gotten home.  Patient states that he really needs sleep and he feels like he would be better.  The patient denies chest pain, shortness of breath, headache,blurred vision, neck pain, fever, cough, weakness, numbness, dizziness, anorexia, edema, abdominal pain, nausea, vomiting, diarrhea, rash, back pain, dysuria, hematemesis, bloody stool, near syncope, or syncope. Past Medical History:  Diagnosis Date  . Anxiety   . B12 deficiency   . Back pain   . Diabetes mellitus without complication ([place])   . Hyperlipidemia   . Testosterone insufficiency     Patient Active Problem List   Diagnosis Date Noted  . Bacteremia   . Sepsis due to undetermined organism ([place]) 09/01/2018  . Diabetes mellitus without complication ([place]) 99/24/2683  . Hyponatremia 09/01/2018  . Polycythemia 09/01/2018  . Abnormal LFTs 09/01/2018  . Hypophosphatemia 09/01/2018  . Altered mental status 09/01/2018    Past Surgical History:  Procedure Laterality Date  . AORTIC VALVE REPLACEMENT  2013   redo bioprosthetic valve at [facility] Dr Mauricio Po  . CHOLECYSTECTOMY    . STRABISMUS SURGERY          Home Medications    Prior to Admission medications   Medication Sig Start Date End Date  Taking? Authorizing Provider  aspirin EC 81 MG tablet Take 81 mg by mouth daily.   Yes [provider]  atorvastatin (LIPITOR) 20 MG tablet Take 20 mg by mouth at bedtime. 06/30/18  Yes [provider]  budesonide-formoterol (SYMBICORT) 80-4.5 MCG/ACT inhaler Inhale 2 puffs into the lungs 2 (two) times daily.   Yes [provider]  citalopram (CELEXA) 40 MG tablet Take 40 mg by mouth daily. 10/06/17  Yes [provider]  cyanocobalamin (,VITAMIN B-12,) 1000 MCG/ML injection Inject 1,000 mcg into the muscle See admin instructions. Instructions from pharmacy:Inject 66mL intramuscularly weekly for four weeks. Then 35mL intramuscularly monthly thereafter.  Pt states: Pt receives every 2 weeks as needed, but not sure what for. Pt also states he has never received weekly injections. 08/17/18  Yes [provider]  gabapentin (NEURONTIN) 100 MG capsule Take 1 capsule (100 mg total) by mouth 3 (three) times daily. 09/07/18  Yes Hall, Carole N, DO  glipiZIDE (GLUCOTROL) 10 MG tablet Take 10 mg by mouth daily. 08/17/18  Yes [provider]  loperamide (IMODIUM) 2 MG capsule Take 1 capsule (2 mg total) by mouth as needed for diarrhea or loose stools. 09/07/18  Yes Kayleen Memos, DO  testosterone cypionate (DEPOTESTOSTERONE CYPIONATE) 200 MG/ML injection Inject 1 mL into the muscle every 14 (fourteen) days. 08/17/18  Yes [provider]  mometasone-formoterol (DULERA) 100-5 MCG/ACT AERO Inhale 2 puffs into the lungs 2 (two) times daily. Patient  not taking: Reported on 09/08/2018 09/07/18   Kayleen Memos, DO    Family History Family History  Problem Relation Age of Onset  . Hypertension Mother   . Diabetes Mother     Social History Social History   Tobacco Use  . Smoking status: Former Smoker    Packs/day: 1.50    Years: 55.00    Pack years: 82.50    Types: Cigarettes    Last attempt to quit: 08/24/2018    Years since quitting: 0.0  . Smokeless  tobacco: Former Network engineer Use Topics  . Alcohol use: Not Currently  . Drug use: Never     Allergies   Patient has no known allergies.   Review of Systems Review of Systems All other systems negative except as documented in the HPI. All pertinent positives and negatives as reviewed in the HPI.  Physical Exam Updated Vital Signs BP 112/84   Pulse (!) 35   Temp (!) 97.5 F (36.4 C) (Oral)   Resp 16   Ht 5\' 10"  (1.778 m)   Wt 99.8 kg   SpO2 100%   BMI 31.57 kg/m   Physical Exam Vitals signs and nursing note reviewed.  Constitutional:      General: He is not in acute distress.    Appearance: He is well-developed.  HENT:     Head: Normocephalic and atraumatic.  Eyes:     Pupils: Pupils are equal, round, and reactive to light.  Neck:     Musculoskeletal: Normal range of motion and neck supple.  Cardiovascular:     Rate and Rhythm: Normal rate and regular rhythm.     Heart sounds: Normal heart sounds. No murmur. No friction rub. No gallop.   Pulmonary:     Effort: Pulmonary effort is normal. No respiratory distress.     Breath sounds: Normal breath sounds. No wheezing.  Abdominal:     General: Bowel sounds are normal. There is no distension.     Palpations: Abdomen is soft.     Tenderness: There is no abdominal tenderness.  Skin:    General: Skin is warm and dry.     Capillary Refill: Capillary refill takes less than 2 seconds.     Findings: No erythema or rash.  Neurological:     Mental Status: He is alert and oriented to person, place, and time.     Motor: No abnormal muscle tone.     Coordination: Coordination normal.  Psychiatric:        Behavior: Behavior normal.      ED Treatments / Results  Labs (all labs ordered are listed, but only abnormal results are displayed) Labs Reviewed  COMPREHENSIVE METABOLIC PANEL - Abnormal; Notable for the following components:      Result Value   CO2 20 (*)    Albumin 3.1 (*)    AST 45 (*)    GFR calc non Af  Amer 59 (*)    All other components within normal limits  CBC WITH DIFFERENTIAL/PLATELET - Abnormal; Notable for the following components:   WBC 18.0 (*)    Neutro Abs 14.8 (*)    Abs Immature Granulocytes 0.35 (*)    All other components within normal limits  URINALYSIS, ROUTINE W REFLEX MICROSCOPIC - Abnormal; Notable for the following components:   Glucose, UA 50 (*)    Hgb urine dipstick SMALL (*)    Ketones, ur 5 (*)    Protein, ur 30 (*)    All other  components within normal limits  CBG MONITORING, ED - Abnormal; Notable for the following components:   Glucose-Capillary 156 (*)    All other components within normal limits  CBG MONITORING, ED - Abnormal; Notable for the following components:   Glucose-Capillary 67 (*)    All other components within normal limits    EKG None  Radiology No results found.  Procedures Procedures (including critical care time)  Medications Ordered in ED Medications  sodium chloride 0.9 % bolus 500 mL (has no administration in time range)  ibuprofen (ADVIL,MOTRIN) tablet 600 mg (600 mg Oral Given 09/08/18 1847)     Initial Impression / Assessment and Plan / ED Course  I have reviewed the triage vital signs and the nursing notes.  Pertinent labs & imaging results that were available during my care of the patient were reviewed by me and considered in my medical decision making (see chart for details).     Patient blood sugar went down to 67 while he was here.  The patient is emphatic that he does not want to be admitted.  His vital signs remained stable here in the emergency department otherwise.  Have advised him he will need to eat or else he will have to be admitted to the hospital.  He states he does not want to be admitted.  Final Clinical Impressions(s) / ED Diagnoses   Final diagnoses:  None    ED Discharge Orders    None       Rebeca Allegra 09/08/18 2327    Mesner, Corene Cornea, MD 09/09/18 2053

## 2018-09-08 NOTE — ED Notes (Signed)
C/o a headache still and he is c/o being cold again  hes hot and cold interfmitgtently

## 2018-09-08 NOTE — ED Triage Notes (Signed)
The pt arrived by gems from home.  He was found to have a 72 blood sugar.  He was given d10w  110ml    His sweating stopped he became very irrate  He does not wan=t [place] to do anythingfor him or t him he is c/o being too cold

## 2018-09-09 ENCOUNTER — Emergency Department (HOSPITAL_COMMUNITY): Payer: Medicare Other

## 2018-09-09 ENCOUNTER — Inpatient Hospital Stay (HOSPITAL_COMMUNITY)
Admission: EM | Admit: 2018-09-09 | Discharge: 2018-09-14 | DRG: 872 | Disposition: A | Discharge status: Home / Self Care | Payer: Medicare Other | Attending: Internal Medicine | Admitting: Internal Medicine

## 2018-09-09 ENCOUNTER — Other Ambulatory Visit: Payer: Self-pay

## 2018-09-09 ENCOUNTER — Encounter (HOSPITAL_COMMUNITY): Payer: Self-pay

## 2018-09-09 DIAGNOSIS — E785 Hyperlipidemia, unspecified: Secondary | ICD-10-CM | POA: Diagnosis not present

## 2018-09-09 DIAGNOSIS — R410 Disorientation, unspecified: Secondary | ICD-10-CM | POA: Diagnosis not present

## 2018-09-09 DIAGNOSIS — Z7982 Long term (current) use of aspirin: Secondary | ICD-10-CM

## 2018-09-09 DIAGNOSIS — E871 Hypo-osmolality and hyponatremia: Secondary | ICD-10-CM | POA: Diagnosis not present

## 2018-09-09 DIAGNOSIS — E119 Type 2 diabetes mellitus without complications: Secondary | ICD-10-CM

## 2018-09-09 DIAGNOSIS — Z833 Family history of diabetes mellitus: Secondary | ICD-10-CM | POA: Diagnosis not present

## 2018-09-09 DIAGNOSIS — R509 Fever, unspecified: Secondary | ICD-10-CM | POA: Diagnosis not present

## 2018-09-09 DIAGNOSIS — E860 Dehydration: Secondary | ICD-10-CM | POA: Diagnosis present

## 2018-09-09 DIAGNOSIS — Z952 Presence of prosthetic heart valve: Secondary | ICD-10-CM

## 2018-09-09 DIAGNOSIS — Z7984 Long term (current) use of oral hypoglycemic drugs: Secondary | ICD-10-CM

## 2018-09-09 DIAGNOSIS — B9561 Methicillin susceptible Staphylococcus aureus infection as the cause of diseases classified elsewhere: Secondary | ICD-10-CM | POA: Diagnosis present

## 2018-09-09 DIAGNOSIS — R0902 Hypoxemia: Secondary | ICD-10-CM | POA: Diagnosis not present

## 2018-09-09 DIAGNOSIS — E11649 Type 2 diabetes mellitus with hypoglycemia without coma: Secondary | ICD-10-CM | POA: Diagnosis not present

## 2018-09-09 DIAGNOSIS — Z87891 Personal history of nicotine dependence: Secondary | ICD-10-CM

## 2018-09-09 DIAGNOSIS — E876 Hypokalemia: Secondary | ICD-10-CM | POA: Diagnosis not present

## 2018-09-09 DIAGNOSIS — Z7951 Long term (current) use of inhaled steroids: Secondary | ICD-10-CM

## 2018-09-09 DIAGNOSIS — E538 Deficiency of other specified B group vitamins: Secondary | ICD-10-CM | POA: Diagnosis not present

## 2018-09-09 DIAGNOSIS — R21 Rash and other nonspecific skin eruption: Secondary | ICD-10-CM | POA: Diagnosis not present

## 2018-09-09 DIAGNOSIS — A419 Sepsis, unspecified organism: Secondary | ICD-10-CM | POA: Diagnosis not present

## 2018-09-09 DIAGNOSIS — R001 Bradycardia, unspecified: Secondary | ICD-10-CM | POA: Diagnosis not present

## 2018-09-09 DIAGNOSIS — Z8249 Family history of ischemic heart disease and other diseases of the circulatory system: Secondary | ICD-10-CM | POA: Diagnosis not present

## 2018-09-09 DIAGNOSIS — F17211 Nicotine dependence, cigarettes, in remission: Secondary | ICD-10-CM | POA: Diagnosis not present

## 2018-09-09 DIAGNOSIS — Z9049 Acquired absence of other specified parts of digestive tract: Secondary | ICD-10-CM | POA: Diagnosis not present

## 2018-09-09 DIAGNOSIS — R4182 Altered mental status, unspecified: Secondary | ICD-10-CM | POA: Diagnosis present

## 2018-09-09 DIAGNOSIS — E161 Other hypoglycemia: Secondary | ICD-10-CM | POA: Diagnosis not present

## 2018-09-09 DIAGNOSIS — R7881 Bacteremia: Secondary | ICD-10-CM | POA: Diagnosis present

## 2018-09-09 LAB — COMPREHENSIVE METABOLIC PANEL
ALBUMIN: 3.2 g/dL — AB (ref 3.5–5.0)
ALT: 40 U/L (ref 0–44)
AST: 38 U/L (ref 15–41)
Alkaline Phosphatase: 77 U/L (ref 38–126)
Anion gap: 9 (ref 5–15)
BUN: 10 mg/dL (ref 8–23)
CALCIUM: 8.3 mg/dL — AB (ref 8.9–10.3)
CO2: 24 mmol/L (ref 22–32)
Chloride: 98 mmol/L (ref 98–111)
Creatinine, Ser: 0.94 mg/dL (ref 0.61–1.24)
GFR calc Af Amer: >60 mL/min (ref >60)
GFR calc non Af Amer: >60 mL/min (ref >60)
Glucose, Bld: 82 mg/dL (ref 70–99)
Potassium: 3.3 mmol/L — ABNORMAL LOW (ref 3.5–5.1)
SODIUM: 131 mmol/L — AB (ref 135–145)
Total Bilirubin: 1.8 mg/dL — ABNORMAL HIGH (ref 0.3–1.2)
Total Protein: 6.6 g/dL (ref 6.5–8.1)

## 2018-09-09 LAB — CBC WITH DIFFERENTIAL/PLATELET
Abs Immature Granulocytes: 0.09 10*3/uL — ABNORMAL HIGH (ref 0.00–0.07)
Basophils Absolute: 0.1 10*3/uL (ref 0.0–0.1)
Basophils Relative: 1 %
Eosinophils Absolute: 0.1 10*3/uL (ref 0.0–0.5)
Eosinophils Relative: 1 %
HEMATOCRIT: 47.4 % (ref 39.0–52.0)
Hemoglobin: 15.7 g/dL (ref 13.0–17.0)
Immature Granulocytes: 1 %
Lymphocytes Relative: 6 %
Lymphs Abs: 0.8 10*3/uL (ref 0.7–4.0)
MCH: 29.8 pg (ref 26.0–34.0)
MCHC: 33.1 g/dL (ref 30.0–36.0)
MCV: 90.1 fL (ref 80.0–100.0)
Monocytes Absolute: 0.6 10*3/uL (ref 0.1–1.0)
Monocytes Relative: 5 %
Neutro Abs: 11.2 10*3/uL — ABNORMAL HIGH (ref 1.7–7.7)
Neutrophils Relative %: 86 %
Platelets: 173 10*3/uL (ref 150–400)
RBC: 5.26 MIL/uL (ref 4.22–5.81)
RDW: 14.5 % (ref 11.5–15.5)
WBC: 12.9 10*3/uL — ABNORMAL HIGH (ref 4.0–10.5)
nRBC: 0 % (ref 0.0–0.2)

## 2018-09-09 LAB — URINALYSIS, ROUTINE W REFLEX MICROSCOPIC
BACTERIA UA: NONE SEEN
Bilirubin Urine: NEGATIVE
Glucose, UA: NEGATIVE mg/dL
Ketones, ur: NEGATIVE mg/dL
LEUKOCYTES UA: NEGATIVE
Nitrite: NEGATIVE
Protein, ur: NEGATIVE mg/dL
Specific Gravity, Urine: 1.013 (ref 1.005–1.030)
pH: 6 (ref 5.0–8.0)

## 2018-09-09 LAB — I-STAT TROPONIN, ED: Troponin i, poc: 0.02 ng/mL (ref 0.00–0.08)

## 2018-09-09 LAB — CBG MONITORING, ED
Glucose-Capillary: 182 mg/dL — ABNORMAL HIGH (ref 70–99)
Glucose-Capillary: 80 mg/dL (ref 70–99)

## 2018-09-09 LAB — LACTIC ACID, PLASMA: Lactic Acid, Venous: 0.8 mmol/L (ref 0.5–1.9)

## 2018-09-09 MED ORDER — MOMETASONE FURO-FORMOTEROL FUM 100-5 MCG/ACT IN AERO
2.0000 | INHALATION_SPRAY | Freq: Two times a day (BID) | RESPIRATORY_TRACT | Status: DC
  Administered 2018-09-10 – 2018-09-13 (×5): 2 via RESPIRATORY_TRACT
  Filled 2018-09-09: qty 8.8

## 2018-09-09 MED ORDER — LOPERAMIDE HCL 2 MG PO CAPS
2.0000 mg | ORAL_CAPSULE | ORAL | Status: DC | PRN
  Administered 2018-09-11: 2 mg via ORAL
  Filled 2018-09-09: qty 1

## 2018-09-09 MED ORDER — CITALOPRAM HYDROBROMIDE 20 MG PO TABS
40.0000 mg | ORAL_TABLET | Freq: Every day | ORAL | Status: DC
  Administered 2018-09-10: 40 mg via ORAL
  Filled 2018-09-09 (×2): qty 2

## 2018-09-09 MED ORDER — HEPARIN SODIUM (PORCINE) 5000 UNIT/ML IJ SOLN
5000.0000 [IU] | Freq: Three times a day (TID) | INTRAMUSCULAR | Status: DC
  Administered 2018-09-10 – 2018-09-14 (×13): 5000 [IU] via SUBCUTANEOUS
  Filled 2018-09-09 (×13): qty 1

## 2018-09-09 MED ORDER — CEFAZOLIN SODIUM-DEXTROSE 1-4 GM/50ML-% IV SOLN: 1.0000 g | Freq: Three times a day (TID) | INTRAVENOUS | Status: DC

## 2018-09-09 MED ORDER — ONDANSETRON HCL 4 MG/2ML IJ SOLN: 4.0000 mg | Freq: Four times a day (QID) | INTRAMUSCULAR | Status: DC | PRN

## 2018-09-09 MED ORDER — SODIUM CHLORIDE 0.9 % IV SOLN
INTRAVENOUS | Status: DC
  Administered 2018-09-10 – 2018-09-13 (×6): via INTRAVENOUS

## 2018-09-09 MED ORDER — CEFAZOLIN SODIUM-DEXTROSE 2-4 GM/100ML-% IV SOLN
2.0000 g | Freq: Three times a day (TID) | INTRAVENOUS | Status: DC
  Administered 2018-09-09 – 2018-09-10 (×3): 2 g via INTRAVENOUS
  Filled 2018-09-09 (×6): qty 100

## 2018-09-09 MED ORDER — ACETAMINOPHEN 650 MG RE SUPP
RECTAL | Status: AC
  Filled 2018-09-09: qty 1

## 2018-09-09 MED ORDER — SODIUM CHLORIDE 0.9 % IV BOLUS
500.0000 mL | Freq: Once | INTRAVENOUS | Status: AC
  Administered 2018-09-09: 500 mL via INTRAVENOUS

## 2018-09-09 MED ORDER — GABAPENTIN 100 MG PO CAPS
100.0000 mg | ORAL_CAPSULE | Freq: Three times a day (TID) | ORAL | Status: DC
  Administered 2018-09-10 – 2018-09-14 (×13): 100 mg via ORAL
  Filled 2018-09-09 (×14): qty 1

## 2018-09-09 MED ORDER — ASPIRIN EC 81 MG PO TBEC
81.0000 mg | DELAYED_RELEASE_TABLET | Freq: Every day | ORAL | Status: DC
  Administered 2018-09-10 – 2018-09-14 (×5): 81 mg via ORAL
  Filled 2018-09-09 (×5): qty 1

## 2018-09-09 MED ORDER — INSULIN ASPART 100 UNIT/ML ~~LOC~~ SOLN
0.0000 [IU] | Freq: Three times a day (TID) | SUBCUTANEOUS | Status: DC
  Administered 2018-09-12: 2 [IU] via SUBCUTANEOUS

## 2018-09-09 MED ORDER — ACETAMINOPHEN 650 MG RE SUPP
650.0000 mg | Freq: Four times a day (QID) | RECTAL | Status: DC | PRN
  Administered 2018-09-10: 650 mg via RECTAL

## 2018-09-09 MED ORDER — LORAZEPAM 0.5 MG PO TABS
0.5000 mg | ORAL_TABLET | Freq: Every day | ORAL | Status: DC
  Administered 2018-09-10 – 2018-09-13 (×5): 0.5 mg via ORAL
  Filled 2018-09-09 (×5): qty 1

## 2018-09-09 MED ORDER — MOMETASONE FURO-FORMOTEROL FUM 100-5 MCG/ACT IN AERO: 2.0000 | INHALATION_SPRAY | Freq: Two times a day (BID) | RESPIRATORY_TRACT | Status: DC

## 2018-09-09 MED ORDER — ONDANSETRON HCL 4 MG PO TABS: 4.0000 mg | ORAL_TABLET | Freq: Four times a day (QID) | ORAL | Status: DC | PRN

## 2018-09-09 MED ORDER — INSULIN ASPART 100 UNIT/ML ~~LOC~~ SOLN
0.0000 [IU] | Freq: Every day | SUBCUTANEOUS | Status: DC
  Administered 2018-09-12: 2 [IU] via SUBCUTANEOUS

## 2018-09-09 MED ORDER — ATORVASTATIN CALCIUM 20 MG PO TABS
20.0000 mg | ORAL_TABLET | Freq: Every day | ORAL | Status: DC
  Administered 2018-09-10 – 2018-09-13 (×5): 20 mg via ORAL
  Filled 2018-09-09 (×5): qty 1

## 2018-09-09 MED ORDER — CYANOCOBALAMIN 1000 MCG/ML IJ SOLN
1000.0000 ug | INTRAMUSCULAR | Status: DC
  Filled 2018-09-09: qty 1

## 2018-09-09 MED ORDER — GLIPIZIDE 10 MG PO TABS
10.0000 mg | ORAL_TABLET | Freq: Every day | ORAL | Status: DC
  Administered 2018-09-10 – 2018-09-13 (×4): 10 mg via ORAL
  Filled 2018-09-09 (×4): qty 1

## 2018-09-09 MED ORDER — ACETAMINOPHEN 325 MG PO TABS
650.0000 mg | ORAL_TABLET | Freq: Four times a day (QID) | ORAL | Status: DC | PRN
  Administered 2018-09-10 – 2018-09-11 (×2): 650 mg via ORAL
  Filled 2018-09-09 (×2): qty 2

## 2018-09-09 MED ORDER — IBUPROFEN 200 MG PO TABS
600.0000 mg | ORAL_TABLET | Freq: Four times a day (QID) | ORAL | Status: DC | PRN
  Administered 2018-09-11 – 2018-09-13 (×3): 600 mg via ORAL
  Filled 2018-09-09 (×3): qty 3

## 2018-09-09 MED ORDER — LORAZEPAM 0.5 MG PO TABS: 0.5000 mg | ORAL_TABLET | Freq: Every day | ORAL | 0 refills | Status: DC

## 2018-09-09 NOTE — ED Notes (Signed)
Bed: WA07 Expected date:  Expected time:  Means of arrival:  Comments: 42M confused

## 2018-09-09 NOTE — ED Notes (Signed)
ED TO INPATIENT HANDOFF REPORT  Name/Age/Gender Michael Hines 73 y.o. male  Code Status Code Status History    Date Active Date Inactive Code Status Order ID Comments User Context   09/01/2018 0736 09/07/2018 1955 Full Code [id number]  Michael Milan, MD ED      Home/SNF/Other Home  Chief Complaint Altered Mental Status  Level of Care/Admitting Diagnosis ED Disposition    ED Disposition Condition [facility]: [facility] [100102]  Level of Care: Telemetry [5]  Admit to tele based on following criteria: Other see comments  Comments: acute Delirium  Diagnosis: Delirium [741287]  Admitting Physician: Elwyn Reach [[id number]]  Attending Physician: Elwyn Reach [[id number]]  Estimated length of stay: past midnight tomorrow  Certification:: I certify this patient will need inpatient services for at least 2 midnights  PT Class (Do Not Modify): Inpatient [101]  PT Acc Code (Do Not Modify): Private [1]       Medical History Past Medical History:  Diagnosis Date  . Anxiety   . B12 deficiency   . Back pain   . Diabetes mellitus without complication ([place])   . Hyperlipidemia   . Testosterone insufficiency     Allergies No Known Allergies  IV Location/Drains/Wounds Patient Lines/Drains/Airways Status   Active Line/Drains/Airways    Name:   Placement date:   Placement time:   Site:   Days:   Peripheral IV 09/09/18 Right Forearm   09/09/18    2045    Forearm   less than 1   Peripheral IV 09/09/18 Left Forearm   09/09/18    2200    Forearm   less than 1          Labs/Imaging Results for orders placed or performed during the hospital encounter of 09/09/18 (from the past 48 hour(s))  Lactic acid, plasma     Status: None   Collection Time: 09/09/18  9:48 PM  Result Value Ref Range   Lactic Acid, Venous 0.8 0.5 - 1.9 mmol/L    Comment: Performed at [facility], [place] [address], [place], [place] [address]   Comprehensive metabolic panel     Status: Abnormal   Collection Time: 09/09/18  9:48 PM  Result Value Ref Range   Sodium 131 (L) 135 - 145 mmol/L   Potassium 3.3 (L) 3.5 - 5.1 mmol/L   Chloride 98 98 - 111 mmol/L   CO2 24 22 - 32 mmol/L   Glucose, Bld 82 70 - 99 mg/dL   BUN 10 8 - 23 mg/dL   Creatinine, Ser 0.94 0.61 - 1.24 mg/dL   Calcium 8.3 (L) 8.9 - 10.3 mg/dL   Total Protein 6.6 6.5 - 8.1 g/dL   Albumin 3.2 (L) 3.5 - 5.0 g/dL   AST 38 15 - 41 U/L   ALT 40 0 - 44 U/L   Alkaline Phosphatase 77 38 - 126 U/L   Total Bilirubin 1.8 (H) 0.3 - 1.2 mg/dL   GFR calc non Af Amer >60 >60 mL/min   GFR calc Af Amer >60 >60 mL/min   Anion gap 9 5 - 15    Comment: Performed at [facility], [place] [address] [place], [place] [address]  CBC WITH DIFFERENTIAL     Status: Abnormal   Collection Time: 09/09/18  9:48 PM  Result Value Ref Range   WBC 12.9 (H) 4.0 - 10.5 K/uL   RBC 5.26 4.22 - 5.81 MIL/uL  Hemoglobin 15.7 13.0 - 17.0 g/dL   HCT 47.4 39.0 - 52.0 %   MCV 90.1 80.0 - 100.0 fL   MCH 29.8 26.0 - 34.0 pg   MCHC 33.1 30.0 - 36.0 g/dL   RDW 14.5 11.5 - 15.5 %   Platelets 173 150 - 400 K/uL   nRBC 0.0 0.0 - 0.2 %   Neutrophils Relative % 86 %   Neutro Abs 11.2 (H) 1.7 - 7.7 K/uL   Lymphocytes Relative 6 %   Lymphs Abs 0.8 0.7 - 4.0 K/uL   Monocytes Relative 5 %   Monocytes Absolute 0.6 0.1 - 1.0 K/uL   Eosinophils Relative 1 %   Eosinophils Absolute 0.1 0.0 - 0.5 K/uL   Basophils Relative 1 %   Basophils Absolute 0.1 0.0 - 0.1 K/uL   Immature Granulocytes 1 %   Abs Immature Granulocytes 0.09 (H) 0.00 - 0.07 K/uL    Comment: Performed at [facility], [place] [address] [place], [place] [address]  Urinalysis, Routine w reflex microscopic     Status: Abnormal   Collection Time: 09/09/18  9:48 PM  Result Value Ref Range   Color, Urine YELLOW YELLOW   APPearance CLEAR CLEAR   Specific Gravity, Urine 1.013 1.005 - 1.030   pH 6.0 5.0 - 8.0    Glucose, UA NEGATIVE NEGATIVE mg/dL   Hgb urine dipstick MODERATE (A) NEGATIVE   Bilirubin Urine NEGATIVE NEGATIVE   Ketones, ur NEGATIVE NEGATIVE mg/dL   Protein, ur NEGATIVE NEGATIVE mg/dL   Nitrite NEGATIVE NEGATIVE   Leukocytes, UA NEGATIVE NEGATIVE   RBC / HPF 21-50 0 - 5 RBC/hpf   WBC, UA 0-5 0 - 5 WBC/hpf   Bacteria, UA NONE SEEN NONE SEEN   Squamous Epithelial / LPF 0-5 0 - 5   Mucus PRESENT     Comment: Performed at [facility], [place] [address] [place], [place] [address]  CBG monitoring, ED     Status: None   Collection Time: 09/09/18  9:57 PM  Result Value Ref Range   Glucose-Capillary 80 70 - 99 mg/dL  I-stat troponin, ED     Status: None   Collection Time: 09/09/18 10:12 PM  Result Value Ref Range   Troponin i, poc 0.02 0.00 - 0.08 ng/mL   Comment 3            Comment: Due to the release kinetics of cTnI, a negative result within the first hours of the onset of symptoms does not rule out myocardial infarction with certainty. If myocardial infarction is still suspected, repeat the test at appropriate intervals.    Dg Chest 2 View  Result Date: 09/09/2018 CLINICAL DATA:  Altered mental status EXAM: CHEST - 2 VIEW COMPARISON:  09/01/2018 FINDINGS: Prior CABG. Cardiomegaly. Low lung volumes with bibasilar atelectasis. No overt edema, effusions or acute bony abnormality. IMPRESSION: Cardiomegaly.  Low lung volumes with bibasilar atelectasis. Electronically Signed   By: Rolm Baptise M.D.   On: 09/09/2018 22:35    Pending Labs Unresulted Labs (From admission, onward)    Start     Ordered   09/09/18 2125  Blood Culture (routine x 2)  BLOOD CULTURE X 2,   STAT     09/09/18 2125   09/09/18 2125  Urine culture  ONCE - STAT,   STAT     09/09/18 2125          Vitals/Pain Today's Vitals   09/09/18 2052 09/09/18 2055 09/09/18 2138 09/09/18 2145  BP:   122/85   Pulse:   60   Resp:   (!) 21   Temp:    (!) 101.2 F (38.4 C)  TempSrc:    Rectal   SpO2: 95%  95%   Weight:  99.8 kg    Height:  5\' 10"  (1.778 m)    PainSc:  0-No pain 0-No pain     Isolation Precautions No active isolations  Medications Medications  sodium chloride 0.9 % bolus 500 mL (500 mLs Intravenous New Bag/Given 09/09/18 2207)  ceFAZolin (ANCEF) IVPB 2g/100 mL premix (has no administration in time range)    Mobility walks with person assist

## 2018-09-09 NOTE — ED Triage Notes (Addendum)
Per EMS, patient coming from home with complaints of confusion. Patient was recently discharged from hospital after being treated for sepsis. Family states that patient has not been acting normal since his discharge. Family also reports decreased oral intake.   CBG 97 Patient O2 saturation was 93% on RA so EMS placed patient on 3L of O2 and O2 saturation is now 95%

## 2018-09-09 NOTE — ED Notes (Signed)
Patient transported to X-ray 

## 2018-09-09 NOTE — ED Provider Notes (Addendum)
[facility] DEPT Provider Note   CSN: [id number] Arrival date & time: 09/09/18  2035     History   Chief Complaint Chief Complaint  Patient presents with  . Altered Mental Status    HPI Michael Hines is a 73 y.o. male.  The history is provided by the patient, the spouse and medical records. No language interpreter was used.  Altered Mental Status   Michael Hines is a 73 y.o. male who presents to the Emergency Department complaining of confusion.  Level V caveat due to AMS.  Hx is provided by the patient's wife.  He was admitted to the hospital 1/22-28 for fever, AMS.  He was treated with abx for bacteremia.   Since hospital discharge he has experienced waxing and waning confusion.  He has poor oral intake.  Today he had urinary incontinence.  He has experienced episodes of hypoglycemia.  He had a blood sugar of 62 today and has increased confusion during episodes of hypoglycemia.  He reports mild sob and nausea.  No fall or head injury.    Patient's wife, Michael Hines [phone]  Past Medical History:  Diagnosis Date  . Anxiety   . B12 deficiency   . Back pain   . Diabetes mellitus without complication ([place])   . Hyperlipidemia   . Testosterone insufficiency     Patient Active Problem List   Diagnosis Date Noted  . Delirium 09/09/2018  . Bacteremia   . Sepsis due to undetermined organism ([place]) 09/01/2018  . Diabetes mellitus without complication ([place]) 63/87/5643  . Hyponatremia 09/01/2018  . Polycythemia 09/01/2018  . Abnormal LFTs 09/01/2018  . Hypophosphatemia 09/01/2018  . Altered mental status 09/01/2018    Past Surgical History:  Procedure Laterality Date  . AORTIC VALVE REPLACEMENT  2013   redo bioprosthetic valve at [facility], Dr Mauricio Po  . CHOLECYSTECTOMY    . STRABISMUS SURGERY          Home Medications    Prior to Admission medications   Medication Sig Start Date End Date Taking? Authorizing Provider    aspirin EC 81 MG tablet Take 81 mg by mouth daily.   Yes [provider]  atorvastatin (LIPITOR) 20 MG tablet Take 20 mg by mouth at bedtime. 06/30/18  Yes [provider]  budesonide-formoterol (SYMBICORT) 80-4.5 MCG/ACT inhaler Inhale 2 puffs into the lungs 2 (two) times daily.   Yes [provider]  citalopram (CELEXA) 40 MG tablet Take 40 mg by mouth daily. 10/06/17  Yes [provider]  cyanocobalamin (,VITAMIN B-12,) 1000 MCG/ML injection Inject 1,000 mcg into the muscle See admin instructions. Instructions from pharmacy:Inject 70mL intramuscularly weekly for four weeks. Then 86mL intramuscularly monthly thereafter.  Pt states: Pt receives every 2 weeks as needed, but not sure what for. Pt also states he has never received weekly injections. 08/17/18  Yes [provider]  glipiZIDE (GLUCOTROL) 10 MG tablet Take 10 mg by mouth daily. 08/17/18  Yes [provider]  loperamide (IMODIUM) 2 MG capsule Take 1 capsule (2 mg total) by mouth as needed for diarrhea or loose stools. 09/07/18  Yes Kayleen Memos, DO  testosterone cypionate (DEPOTESTOSTERONE CYPIONATE) 200 MG/ML injection Inject 1 mL into the muscle every 14 (fourteen) days. 08/17/18  Yes [provider]  gabapentin (NEURONTIN) 100 MG capsule Take 1 capsule (100 mg total) by mouth 3 (three) times daily. 09/07/18   Kayleen Memos, DO  LORazepam (ATIVAN) 0.5 MG tablet Take 1 tablet (0.5  mg total) by mouth at bedtime. 09/09/18   Lawyer, Harrell Gave, PA-C  mometasone-formoterol (DULERA) 100-5 MCG/ACT AERO Inhale 2 puffs into the lungs 2 (two) times daily. Patient not taking: Reported on 09/08/2018 09/07/18   Kayleen Memos, DO    Family History Family History  Problem Relation Age of Onset  . Hypertension Mother   . Diabetes Mother     Social History Social History   Tobacco Use  . Smoking status: Former Smoker    Packs/day: 1.50    Years: 55.00    Pack years: 82.50    Types:  Cigarettes    Last attempt to quit: 08/24/2018    Years since quitting: 0.0  . Smokeless tobacco: Former Network engineer Use Topics  . Alcohol use: Not Currently  . Drug use: Never     Allergies   Patient has no known allergies.   Review of Systems Review of Systems  All other systems reviewed and are negative.    Physical Exam Updated Vital Signs BP 122/85 (BP Location: Left Arm)   Pulse 60   Temp (!) 101.2 F (38.4 C) (Rectal)   Resp (!) 21   Ht 5\' 10"  (1.778 m)   Wt 99.8 kg   SpO2 95%   BMI 31.57 kg/m   Physical Exam Vitals signs and nursing note reviewed.  Constitutional:      Appearance: He is well-developed.  HENT:     Head: Normocephalic and atraumatic.  Cardiovascular:     Rate and Rhythm: Normal rate and regular rhythm.     Heart sounds: No murmur.  Pulmonary:     Effort: Pulmonary effort is normal. No respiratory distress.     Breath sounds: Normal breath sounds.  Abdominal:     Palpations: Abdomen is soft.     Tenderness: There is no abdominal tenderness. There is no guarding or rebound.  Musculoskeletal:        General: No tenderness.  Skin:    General: Skin is warm and dry.  Neurological:     Mental Status: He is alert.     Comments: Confused.  Oriented to person, place, time.  Disoriented to recent events.  5/5 strength in all four extremities with sensation to light touch intact in all four extremities.    Psychiatric:     Comments: Mildly agitated.        ED Treatments / Results  Labs (all labs ordered are listed, but only abnormal results are displayed) Labs Reviewed  COMPREHENSIVE METABOLIC PANEL - Abnormal; Notable for the following components:      Result Value   Sodium 131 (*)    Potassium 3.3 (*)    Calcium 8.3 (*)    Albumin 3.2 (*)    Total Bilirubin 1.8 (*)    All other components within normal limits  CBC WITH DIFFERENTIAL/PLATELET - Abnormal; Notable for the following components:   WBC 12.9 (*)    Neutro Abs 11.2 (*)     Abs Immature Granulocytes 0.09 (*)    All other components within normal limits  URINALYSIS, ROUTINE W REFLEX MICROSCOPIC - Abnormal; Notable for the following components:   Hgb urine dipstick MODERATE (*)    All other components within normal limits  CULTURE, BLOOD (ROUTINE X 2)  CULTURE, BLOOD (ROUTINE X 2)  URINE CULTURE  LACTIC ACID, PLASMA  CBG MONITORING, ED  I-STAT TROPONIN, ED    EKG EKG Interpretation  Date/Time:  Thursday September 09 2018 21:38:09 EST Ventricular Rate:  60  PR Interval:    QRS Duration: 93 QT Interval:  418 QTC Calculation: 418 R Axis:   -26 Text Interpretation:  Sinus or ectopic atrial rhythm Ventricular premature complex Borderline left axis deviation Abnormal R-wave progression, late transition Borderline T wave abnormalities Confirmed by Quintella Reichert [phone]) on 09/09/2018 9:58:19 PM   Radiology Dg Chest 2 View  Result Date: 09/09/2018 CLINICAL DATA:  Altered mental status EXAM: CHEST - 2 VIEW COMPARISON:  09/01/2018 FINDINGS: Prior CABG. Cardiomegaly. Low lung volumes with bibasilar atelectasis. No overt edema, effusions or acute bony abnormality. IMPRESSION: Cardiomegaly.  Low lung volumes with bibasilar atelectasis. Electronically Signed   By: Rolm Baptise M.D.   On: 09/09/2018 22:35    Procedures Procedures (including critical care time)  Medications Ordered in ED Medications  ceFAZolin (ANCEF) IVPB 2g/100 mL premix (2 g Intravenous New Bag/Given 09/09/18 2310)  sodium chloride 0.9 % bolus 500 mL (0 mLs Intravenous Stopped 09/09/18 2308)     Initial Impression / Assessment and Plan / ED Course  I have reviewed the triage vital signs and the nursing notes.  Pertinent labs & imaging results that were available during my care of the patient were reviewed by me and considered in my medical decision making (see chart for details).     Patient here for evaluation of altered mental status. He is noted to be febrile and encephalopathy on  examination concerning for delirium. He was treated with Ancef for presumed bacteremia. No clear source of infection based on history and exam. Plan to admit for further evaluation and management. Hospitalist consulted for admission.   CRITICAL CARE Performed by: Quintella Reichert   Total critical care time: 30 minutes  Critical care time was exclusive of separately billable procedures and treating other patients.  Critical care was necessary to treat or prevent imminent or life-threatening deterioration.  Critical care was time spent personally by me on the following activities: development of treatment plan with patient and/or surrogate as well as nursing, discussions with consultants, evaluation of patient's response to treatment, examination of patient, obtaining history from patient or surrogate, ordering and performing treatments and interventions, ordering and review of laboratory studies, ordering and review of radiographic studies, pulse oximetry and re-evaluation of patient's condition.  Final Clinical Impressions(s) / ED Diagnoses   Final diagnoses:  None    ED Discharge Orders    None       Quintella Reichert, MD 09/09/18 2328    Quintella Reichert, MD 09/21/18 [phone]

## 2018-09-09 NOTE — Discharge Instructions (Signed)
Return here as needed.  You will need to check your sugar during the night to make sure it is not dropping.  You need to eat something before bedtime as well.  Follow-up with your primary doctor.

## 2018-09-09 NOTE — H&P (Signed)
History and Physical   Michael Hines WUX:[id number] DOB: 07/17/1946 DOA: 09/09/2018  Referring MD/NP/PA: Dr. Ralene Bathe  PCP: Finis Bud, MD   Outpatient Specialists: None  Patient coming from: Home  Chief Complaint: Fever and chills  HPI: Michael Hines is a 73 y.o. male with medical history significant of anxiety, B12 deficiency, back pain, type 2 diabetes, hyperlipidemia, testosterone insufficiency, aortic insufficiency with a history of aortic valve replacement who is coming to the emergency department with altered mental status and fever for 3 days.  He was recently admitted with similar symptoms after receiving the influenza vaccine.  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  TEE was done on January 28 which was negative.  He was subsequently discharged home but came back today with another fever of 103 chills as well as significant weakness.  Patient's previous blood cultures grew coag negative staph 1 out of 2 bottles.  He appears to have another episode of sepsis from undetermined organism.  Hospitalist consulted from the ER to admit.    ED Course: Temperature is 101.2 with blood pressure 119/84.  Pulse 92 and respiratory rate of 21 oxygen sat 95% on room air.  White count is 12.9.  Hemoglobin and platelets are within normal.  Sodium 131 and potassium 3.3.  The rest of the chemistry appears to be within normal.  Chest x-ray and urinalysis are all negative.  Patient is being admitted with sepsis most likely due to undetermined organism again.  Review of Systems: As per HPI otherwise 10 point review of systems negative.    Past Medical History:  Diagnosis Date  . Anxiety   . B12 deficiency   . Back pain   . Diabetes mellitus without complication ([place])   . Hyperlipidemia   . Testosterone insufficiency     Past Surgical History:  Procedure Laterality Date  . AORTIC VALVE REPLACEMENT  2013   redo  bioprosthetic valve at [facility], Dr Mauricio Po  . CHOLECYSTECTOMY    . STRABISMUS SURGERY       reports that he quit smoking about 2 weeks ago. His smoking use included cigarettes. He has a 82.50 pack-year smoking history. He has quit using smokeless tobacco. He reports previous alcohol use. He reports that he does not use drugs.  No Known Allergies  Family History  Problem Relation Age of Onset  . Hypertension Mother   . Diabetes Mother      Prior to Admission medications   Medication Sig Start Date End Date Taking? Authorizing Provider  aspirin EC 81 MG tablet Take 81 mg by mouth daily.   Yes [provider]  atorvastatin (LIPITOR) 20 MG tablet Take 20 mg by mouth at bedtime. 06/30/18  Yes [provider]  budesonide-formoterol (SYMBICORT) 80-4.5 MCG/ACT inhaler Inhale 2 puffs into the lungs 2 (two) times daily.   Yes [provider]  citalopram (CELEXA) 40 MG tablet Take 40 mg by mouth daily. 10/06/17  Yes [provider]  cyanocobalamin (,VITAMIN B-12,) 1000 MCG/ML injection Inject 1,000 mcg into the muscle See admin instructions. Instructions from pharmacy:Inject 6mL intramuscularly weekly for four weeks. Then 51mL intramuscularly monthly thereafter.  Pt states: Pt receives every 2 weeks as needed, but not sure what for. Pt also states he has never received weekly injections. 08/17/18  Yes [provider]  glipiZIDE (GLUCOTROL) 10 MG tablet Take 10 mg by mouth daily. 08/17/18  Yes [provider]  loperamide (IMODIUM) 2 MG capsule  Take 1 capsule (2 mg total) by mouth as needed for diarrhea or loose stools. 09/07/18  Yes Kayleen Memos, DO  testosterone cypionate (DEPOTESTOSTERONE CYPIONATE) 200 MG/ML injection Inject 1 mL into the muscle every 14 (fourteen) days. 08/17/18  Yes [provider]  gabapentin (NEURONTIN) 100 MG capsule Take 1 capsule (100 mg total) by mouth 3 (three) times daily. 09/07/18   Kayleen Memos, DO  LORazepam  (ATIVAN) 0.5 MG tablet Take 1 tablet (0.5 mg total) by mouth at bedtime. 09/09/18   Lawyer, Harrell Gave, PA-C  mometasone-formoterol (DULERA) 100-5 MCG/ACT AERO Inhale 2 puffs into the lungs 2 (two) times daily. Patient not taking: Reported on 09/08/2018 09/07/18   Kayleen Memos, DO    Physical Exam: Vitals:   09/09/18 2052 09/09/18 2055 09/09/18 2138 09/09/18 2145  BP:   122/85   Pulse:   60   Resp:   (!) 21   Temp:    (!) 101.2 F (38.4 C)  TempSrc:    Rectal  SpO2: 95%  95%   Weight:  99.8 kg    Height:  5\' 10"  (1.778 m)        Constitutional: NAD, calm, uncomfortable and shaking in bed Vitals:   09/09/18 2052 09/09/18 2055 09/09/18 2138 09/09/18 2145  BP:   122/85   Pulse:   60   Resp:   (!) 21   Temp:    (!) 101.2 F (38.4 C)  TempSrc:    Rectal  SpO2: 95%  95%   Weight:  99.8 kg    Height:  5\' 10"  (1.778 m)     Eyes: PERRL, lids and conjunctivae normal ENMT: Mucous membranes are moist. Posterior pharynx clear of any exudate or lesions.Normal dentition.  Neck: normal, supple, no masses, no thyromegaly Respiratory: clear to auscultation bilaterally, no wheezing, no crackles. Normal respiratory effort. No accessory muscle use.  Cardiovascular: Regular rate and rhythm, no murmurs / rubs / gallops. No extremity edema. 2+ pedal pulses. No carotid bruits.  Abdomen: no tenderness, no masses palpated. No hepatosplenomegaly. Bowel sounds positive.  Musculoskeletal: no clubbing / cyanosis. No joint deformity upper and lower extremities. Good ROM, no contractures. Normal muscle tone.  Skin: no rashes, lesions, ulcers. No induration Neurologic: CN 2-12 grossly intact. Sensation intact, DTR normal. Strength 5/5 in all 4.  Psychiatric: Normal judgment and insight. Alert and oriented x 3. Normal mood.     Labs on Admission: I have personally reviewed following labs and imaging studies  CBC: Recent Labs  Lab 09/05/18 1018 09/06/18 0546 09/07/18 0607 09/08/18 1715  09/09/18 2148  WBC 12.4* 14.9* 15.9* 18.0* 12.9*  NEUTROABS 9.3* 11.1* 11.6* 14.8* 11.2*  HGB 15.7 16.6 17.2* 16.3 15.7  HCT 47.6 51.2 53.1* 51.0 47.4  MCV 91.7 93.8 91.6 91.2 90.1  PLT 229 232 287 269 237   Basic Metabolic Panel: Recent Labs  Lab 09/05/18 1018 09/06/18 0546 09/07/18 0607 09/08/18 1715 09/09/18 2148  NA 136 136 137 137 131*  K 3.4* 3.5 3.5 4.0 3.3*  CL 102 103 102 102 98  CO2 28 24 24  20* 24  GLUCOSE 175* 131* 139* 97 82  BUN 7* 5* 8 9 10   CREATININE 0.91 0.86 0.99 1.23 0.94  CALCIUM 8.3* 8.5* 8.6* 9.3 8.3*   GFR: Estimated Creatinine Clearance: 86.6 mL/min (by C-G formula based on SCr of 0.94 mg/dL). Liver Function Tests: Recent Labs  Lab 09/03/18 0259 09/08/18 1715 09/09/18 2148  AST 35 45* 38  ALT 38 38  40  ALKPHOS 52 63 77  BILITOT 0.9 1.1 1.8*  PROT 5.5* 7.8 6.6  ALBUMIN 2.7* 3.1* 3.2*   No results for input(s): LIPASE, AMYLASE in the last 168 hours. No results for input(s): AMMONIA in the last 168 hours. Coagulation Profile: No results for input(s): INR, PROTIME in the last 168 hours. Cardiac Enzymes: No results for input(s): CKTOTAL, CKMB, CKMBINDEX, TROPONINI in the last 168 hours. BNP (last 3 results) No results for input(s): PROBNP in the last 8760 hours. HbA1C: No results for input(s): HGBA1C in the last 72 hours. CBG: Recent Labs  Lab 09/06/18 1952 09/08/18 1602 09/08/18 2230 09/08/18 2359 09/09/18 2157  GLUCAP 123* 156* 67* 182* 80   Lipid Profile: No results for input(s): CHOL, HDL, LDLCALC, TRIG, CHOLHDL, LDLDIRECT in the last 72 hours. Thyroid Function Tests: No results for input(s): TSH, T4TOTAL, FREET4, T3FREE, THYROIDAB in the last 72 hours. Anemia Panel: No results for input(s): VITAMINB12, FOLATE, FERRITIN, TIBC, IRON, RETICCTPCT in the last 72 hours. Urine analysis:    Component Value Date/Time   COLORURINE YELLOW 09/09/2018 2148   APPEARANCEUR CLEAR 09/09/2018 2148   LABSPEC 1.013 09/09/2018 2148    PHURINE 6.0 09/09/2018 2148   GLUCOSEU NEGATIVE 09/09/2018 2148   HGBUR MODERATE (A) 09/09/2018 2148   BILIRUBINUR NEGATIVE 09/09/2018 2148   KETONESUR NEGATIVE 09/09/2018 2148   PROTEINUR NEGATIVE 09/09/2018 2148   NITRITE NEGATIVE 09/09/2018 2148   LEUKOCYTESUR NEGATIVE 09/09/2018 2148   Sepsis Labs: @LABRCNTIP (procalcitonin:4,lacticidven:4) ) Recent Results (from the past 240 hour(s))  Culture, blood (Routine x 2)     Status: Abnormal   Collection Time: 09/01/18  5:03 AM  Result Value Ref Range Status   Specimen Description   Final    BLOOD BLOOD LEFT FOREARM Performed at [facility], [place] [address], [place], [place] [address]    Special Requests   Final    BOTTLES DRAWN AEROBIC AND ANAEROBIC Blood Culture adequate volume Performed at [place] [address] [place], [place] [address]    Culture  Setup Time   Final    GRAM POSITIVE COCCI AEROBIC BOTTLE ONLY CRITICAL RESULT CALLED TO, READ BACK BY AND VERIFIED WITH: PHARMD M RENZ [id number] MLM    Culture (A)  Final    STAPHYLOCOCCUS SPECIES (COAGULASE NEGATIVE) THE SIGNIFICANCE OF ISOLATING THIS ORGANISM FROM A SINGLE SET OF BLOOD CULTURES WHEN MULTIPLE SETS ARE DRAWN IS UNCERTAIN. PLEASE NOTIFY THE MICROBIOLOGY DEPARTMENT WITHIN ONE WEEK IF SPECIATION AND SENSITIVITIES ARE REQUIRED. Performed at [facility], [place] [address], [place], [place] [address]    Report Status 09/03/2018 FINAL  Final  Culture, blood (Routine x 2)     Status: None   Collection Time: 09/01/18  5:03 AM  Result Value Ref Range Status   Specimen Description   Final    BLOOD BLOOD RIGHT FOREARM Performed at [place] [address], [place], [place] [address]    Special Requests   Final    BOTTLES DRAWN AEROBIC AND ANAEROBIC Blood Culture adequate volume Performed at [place] [address] [place], [place] [address]    Culture   Final    NO GROWTH  5 DAYS Performed at [facility], [place] [address] [place], [place] [address]    Report Status 09/06/2018 FINAL  Final  Blood Culture ID Panel (Reflexed)     Status: Abnormal   Collection Time: 09/01/18  5:03 AM  Result Value Ref Range  Status   Enterococcus species NOT DETECTED NOT DETECTED Final   Listeria monocytogenes NOT DETECTED NOT DETECTED Final   Staphylococcus species DETECTED (A) NOT DETECTED Final    Comment: Methicillin (oxacillin) susceptible coagulase negative staphylococcus. Possible blood culture contaminant (unless isolated from more than one blood culture draw or clinical case suggests pathogenicity). No antibiotic treatment is indicated for blood  culture contaminants. CRITICAL RESULT CALLED TO, READ BACK BY AND VERIFIED WITH: Minette Brine [id number] MLM    Staphylococcus aureus (BCID) NOT DETECTED NOT DETECTED Final   Methicillin resistance NOT DETECTED NOT DETECTED Final   Streptococcus species NOT DETECTED NOT DETECTED Final   Streptococcus agalactiae NOT DETECTED NOT DETECTED Final   Streptococcus pneumoniae NOT DETECTED NOT DETECTED Final   Streptococcus pyogenes NOT DETECTED NOT DETECTED Final   Acinetobacter baumannii NOT DETECTED NOT DETECTED Final   Enterobacteriaceae species NOT DETECTED NOT DETECTED Final   Enterobacter cloacae complex NOT DETECTED NOT DETECTED Final   Escherichia coli NOT DETECTED NOT DETECTED Final   Klebsiella oxytoca NOT DETECTED NOT DETECTED Final   Klebsiella pneumoniae NOT DETECTED NOT DETECTED Final   Proteus species NOT DETECTED NOT DETECTED Final   Serratia marcescens NOT DETECTED NOT DETECTED Final   Haemophilus influenzae NOT DETECTED NOT DETECTED Final   Neisseria meningitidis NOT DETECTED NOT DETECTED Final   Pseudomonas aeruginosa NOT DETECTED NOT DETECTED Final   Candida albicans NOT DETECTED NOT DETECTED Final   Candida glabrata NOT DETECTED NOT DETECTED Final   Candida krusei NOT DETECTED NOT DETECTED Final    Candida parapsilosis NOT DETECTED NOT DETECTED Final   Candida tropicalis NOT DETECTED NOT DETECTED Final    Comment: Performed at [facility], [address] [place], [place] [address]  Urine culture     Status: None   Collection Time: 09/01/18  5:07 AM  Result Value Ref Range Status   Specimen Description   Final    URINE, CATHETERIZED Performed at [place] [address] [place], [place] [address]    Special Requests   Final    NONE Performed at [facility], [place] [address] [place], [place] [address]    Culture   Final    NO GROWTH Performed at [facility], [place] [address], [place], [place] [address]    Report Status 09/02/2018 FINAL  Final  CSF culture     Status: None   Collection Time: 09/01/18  6:43 AM  Result Value Ref Range Status   Specimen Description   Final    CSF Performed at [place] [address], [place], [place] [address]    Special Requests   Final    NONE Performed at [facility], [place] [address] [place], [place] [address]    Gram Stain   Final    WBC PRESENT, PREDOMINANTLY MONONUCLEAR NO ORGANISMS SEEN CYTOSPIN SMEAR Gram Stain Report Called to,Read Back By and Verified With: SMITH,T. RN @0738  ON 1.22.2020 BY [email or web address]    Culture   Final    NO GROWTH 3 DAYS Performed at [facility], [place] [address], [place], [place] [address]    Report Status 09/04/2018 FINAL  Final  MRSA PCR Screening     Status: None   Collection Time: 09/01/18  7:55 AM  Result Value Ref Range Status   MRSA by PCR NEGATIVE NEGATIVE Final    Comment:        The GeneXpert MRSA Assay (  FDA approved for NASAL specimens only), is one component of a comprehensive MRSA colonization surveillance program. It is not intended to diagnose MRSA infection nor to guide or monitor treatment for MRSA infections. Performed at [facility], [place] [address],  [place], [place] [address]   Gastrointestinal Panel by PCR , Stool     Status: None   Collection Time: 09/02/18  8:54 AM  Result Value Ref Range Status   Campylobacter species NOT DETECTED NOT DETECTED Final   Plesimonas shigelloides NOT DETECTED NOT DETECTED Final   Salmonella species NOT DETECTED NOT DETECTED Final   Yersinia enterocolitica NOT DETECTED NOT DETECTED Final   Vibrio species NOT DETECTED NOT DETECTED Final   Vibrio cholerae NOT DETECTED NOT DETECTED Final   Enteroaggregative E coli (EAEC) NOT DETECTED NOT DETECTED Final   Enteropathogenic E coli (EPEC) NOT DETECTED NOT DETECTED Final   Enterotoxigenic E coli (ETEC) NOT DETECTED NOT DETECTED Final   Shiga like toxin producing E coli (STEC) NOT DETECTED NOT DETECTED Final   Shigella/Enteroinvasive E coli (EIEC) NOT DETECTED NOT DETECTED Final   Cryptosporidium NOT DETECTED NOT DETECTED Final   Cyclospora cayetanensis NOT DETECTED NOT DETECTED Final   Entamoeba histolytica NOT DETECTED NOT DETECTED Final   Giardia lamblia NOT DETECTED NOT DETECTED Final   Adenovirus F40/41 NOT DETECTED NOT DETECTED Final   Astrovirus NOT DETECTED NOT DETECTED Final   Norovirus GI/GII NOT DETECTED NOT DETECTED Final   Rotavirus A NOT DETECTED NOT DETECTED Final   Sapovirus (I, II, IV, and V) NOT DETECTED NOT DETECTED Final    Comment: Performed at [facility], [place] [place], [place] [address]  C difficile quick scan w PCR reflex     Status: None   Collection Time: 09/02/18  3:32 PM  Result Value Ref Range Status   C Diff antigen NEGATIVE NEGATIVE Final   C Diff toxin NEGATIVE NEGATIVE Final   C Diff interpretation No C. difficile detected.  Final    Comment: Performed at [facility], [place] [address], [place], [place] [address]  Culture, blood (routine x 2)     Status: None   Collection Time: 09/03/18  3:24 PM  Result Value Ref Range Status   Specimen Description   Final    BLOOD LEFT HAND Performed  at [place] [address], [place], [place] [address]    Special Requests   Final    BOTTLES DRAWN AEROBIC ONLY Blood Culture adequate volume Performed at [place] [address], [place], [place] [address]    Culture   Final    NO GROWTH 5 DAYS Performed at [facility], [place] [address] [place], [place] [address]    Report Status 09/08/2018 FINAL  Final  Culture, blood (routine x 2)     Status: None   Collection Time: 09/03/18  3:29 PM  Result Value Ref Range Status   Specimen Description   Final    BLOOD RIGHT ANTECUBITAL Performed at [place] [address], [place], [place] [address]    Special Requests   Final    BOTTLES DRAWN AEROBIC AND ANAEROBIC Blood Culture adequate volume Performed at [place] [address] [place], [place] [address]    Culture   Final    NO GROWTH 5 DAYS Performed at [facility], [place] [address], [place], [place] [address]    Report Status 09/08/2018 FINAL  Final  Radiological Exams on Admission: Dg Chest 2 View  Result Date: 09/09/2018 CLINICAL DATA:  Altered mental status EXAM: CHEST - 2 VIEW COMPARISON:  09/01/2018 FINDINGS: Prior CABG. Cardiomegaly. Low lung volumes with bibasilar atelectasis. No overt edema, effusions or acute bony abnormality. IMPRESSION: Cardiomegaly.  Low lung volumes with bibasilar atelectasis. Electronically Signed   By: Rolm Baptise M.D.   On: 09/09/2018 22:35      Assessment/Plan Principal Problem:   Sepsis due to undetermined organism [facility]) Active Problems:   Diabetes mellitus without complication ([place])   Hyponatremia   Bacteremia   Delirium   Hypokalemia     #1 sepsis: Most likely due to some occult bacteremia.  Patient is having recurrent symptoms after recent hospitalization.  We will admit the patient and start him on IV Ancef.  Obtain another set of blood cultures.  Consult ID.  Patient may  have fastidious organisms or slow-growing organisms including fungal infections.  Will defer work-up to infectious disease.  #2 diabetes: Blood sugar appears to be controlled.  Sliding scale insulin will be continued with.  #3 hypokalemia: Replete potassium.  #4 hyponatremia: Most likely due to dehydration.  Hydrate gently and follow closely.  #5 acute delirium: Most likely related to his sepsis.  Patient is slowly improving.  Awake and alert x2 now.   DVT prophylaxis: Heparin Code Status: Full code Family Communication: No family available Disposition Plan: To be determined Consults called: Consult ID in the morning Admission status: Inpatient  Severity of Illness: The appropriate patient status for this patient is INPATIENT. Inpatient status is judged to be reasonable and necessary in order to provide the required intensity of service to ensure the patient's safety. The patient's presenting symptoms, physical exam findings, and initial radiographic and laboratory data in the context of their chronic comorbidities is felt to place them at high risk for further clinical deterioration. Furthermore, it is not anticipated that the patient will be medically stable for discharge from the hospital within 2 midnights of admission. The following factors support the patient status of inpatient.   " The patient's presenting symptoms include fever and chills. " The worrisome physical exam findings include patient febrile and shaky. " The initial radiographic and laboratory data are worrisome because of no significant findings. " The chronic co-morbidities include history of aortic valve disease.   * I certify that at the point of admission it is my clinical judgment that the patient will require inpatient hospital care spanning beyond 2 midnights from the point of admission due to high intensity of service, high risk for further deterioration and high frequency of surveillance  required.Barbette Merino MD [facility] Pager [phone]  If 7PM-7AM, please contact night-coverage [email or web address] Password [facility]  09/09/2018, 11:53 PM

## 2018-09-10 ENCOUNTER — Encounter (HOSPITAL_COMMUNITY): Payer: Self-pay | Admitting: Cardiovascular Disease

## 2018-09-10 DIAGNOSIS — Z952 Presence of prosthetic heart valve: Secondary | ICD-10-CM

## 2018-09-10 DIAGNOSIS — E876 Hypokalemia: Secondary | ICD-10-CM

## 2018-09-10 DIAGNOSIS — R509 Fever, unspecified: Secondary | ICD-10-CM

## 2018-09-10 DIAGNOSIS — E119 Type 2 diabetes mellitus without complications: Secondary | ICD-10-CM

## 2018-09-10 DIAGNOSIS — F17211 Nicotine dependence, cigarettes, in remission: Secondary | ICD-10-CM

## 2018-09-10 DIAGNOSIS — R4182 Altered mental status, unspecified: Secondary | ICD-10-CM

## 2018-09-10 DIAGNOSIS — R21 Rash and other nonspecific skin eruption: Secondary | ICD-10-CM

## 2018-09-10 LAB — COMPREHENSIVE METABOLIC PANEL
ALT: 34 U/L (ref 0–44)
AST: 31 U/L (ref 15–41)
Albumin: 2.8 g/dL — ABNORMAL LOW (ref 3.5–5.0)
Alkaline Phosphatase: 72 U/L (ref 38–126)
Anion gap: 10 (ref 5–15)
BUN: 9 mg/dL (ref 8–23)
CO2: 23 mmol/L (ref 22–32)
Calcium: 8 mg/dL — ABNORMAL LOW (ref 8.9–10.3)
Chloride: 102 mmol/L (ref 98–111)
Creatinine, Ser: 0.91 mg/dL (ref 0.61–1.24)
GFR calc Af Amer: >60 mL/min (ref >60)
GFR calc non Af Amer: >60 mL/min (ref >60)
Glucose, Bld: 97 mg/dL (ref 70–99)
Potassium: 3.4 mmol/L — ABNORMAL LOW (ref 3.5–5.1)
SODIUM: 135 mmol/L (ref 135–145)
Total Bilirubin: 1.2 mg/dL (ref 0.3–1.2)
Total Protein: 6 g/dL — ABNORMAL LOW (ref 6.5–8.1)

## 2018-09-10 LAB — CBC
HCT: 48 % (ref 39.0–52.0)
Hemoglobin: 15.4 g/dL (ref 13.0–17.0)
MCH: 30.2 pg (ref 26.0–34.0)
MCHC: 32.1 g/dL (ref 30.0–36.0)
MCV: 94.1 fL (ref 80.0–100.0)
PLATELETS: 156 10*3/uL (ref 150–400)
RBC: 5.1 MIL/uL (ref 4.22–5.81)
RDW: 14.6 % (ref 11.5–15.5)
WBC: 13.5 10*3/uL — ABNORMAL HIGH (ref 4.0–10.5)
nRBC: 0 % (ref 0.0–0.2)

## 2018-09-10 LAB — GLUCOSE, CAPILLARY
Glucose-Capillary: 114 mg/dL — ABNORMAL HIGH (ref 70–99)
Glucose-Capillary: 134 mg/dL — ABNORMAL HIGH (ref 70–99)
Glucose-Capillary: 76 mg/dL (ref 70–99)
Glucose-Capillary: 90 mg/dL (ref 70–99)
Glucose-Capillary: 91 mg/dL (ref 70–99)

## 2018-09-10 LAB — SEDIMENTATION RATE: SED RATE: 15 mm/h (ref 0–16)

## 2018-09-10 LAB — C-REACTIVE PROTEIN: CRP: 13 mg/dL — AB (ref <1.0)

## 2018-09-10 LAB — TSH: TSH: 1.068 u[IU]/mL (ref 0.350–4.500)

## 2018-09-10 MED ORDER — VANCOMYCIN HCL 10 G IV SOLR
1750.0000 mg | INTRAVENOUS | Status: DC
  Administered 2018-09-11 – 2018-09-12 (×2): 1750 mg via INTRAVENOUS
  Filled 2018-09-10 (×2): qty 1750

## 2018-09-10 MED ORDER — VANCOMYCIN HCL 10 G IV SOLR
2000.0000 mg | Freq: Once | INTRAVENOUS | Status: AC
  Administered 2018-09-10: 2000 mg via INTRAVENOUS
  Filled 2018-09-10: qty 2000

## 2018-09-10 MED ORDER — COSYNTROPIN 0.25 MG IJ SOLR
0.2500 mg | Freq: Once | INTRAMUSCULAR | Status: AC
  Administered 2018-09-11: 0.25 mg via INTRAVENOUS
  Filled 2018-09-10: qty 0.25

## 2018-09-10 NOTE — Consult Note (Signed)
   [facility] Inpatient Consult   09/10/2018  Michael Hines 11-16-1945 [id number]    Received referral from office regarding EMMI alert. [name] is currently admitted to [facility].   Will continue to follow and engage for potential [facility] Care Management services at later time.  Spoke with inpatient RNCM to make aware of above notes.    Marthenia Rolling, MSN-Ed, RN,BSN [facility] Liaison [phone]

## 2018-09-10 NOTE — Consult Note (Signed)
[place] for Infectious Disease  Total days of antibiotics 2        Day 2 cefazolin               Reason for Consult:fever   Referring Physician: chiu  Principal Problem:   Sepsis due to undetermined organism [facility]) Active Problems:   Diabetes mellitus without complication ([place])   Hyponatremia   Bacteremia   Delirium   Hypokalemia    HPI: Michael Hines is a 73 y.o. male who was hospitalized from 1/21 through 1/27 for new onset fevers, AMS, n/v/cough. Appeared to have influenza like illness with his infectious work up of lumbar puncture (negative aerobic cx. 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 He had period of hypotension, for which he did find to have low am cortisol, but did not do cortisol stimulation test to see if adrenal function is intact. He was discharged on the 1/28 and readmitted on 1/29 for AMS in the setting of hypoglycemia. His WBC was found to be 13.5k. he had a fever of 102F last night and ID asked to weigh in on management. He was restarted on cefazolin.  Past Medical History:  Diagnosis Date  . Anxiety   . B12 deficiency   . Back pain   . Diabetes mellitus without complication ([place])   . Hyperlipidemia   . Testosterone insufficiency     Allergies: No Known Allergies  MEDICATIONS: . aspirin EC  81 mg Oral Daily  . atorvastatin  20 mg Oral QHS  . citalopram  40 mg Oral Daily  . [START ON 09/11/2018] cyanocobalamin  1,000 mcg Intramuscular Q30 days  . gabapentin  100 mg Oral TID  . glipiZIDE  10 mg Oral Q breakfast  . heparin  5,000 Units Subcutaneous Q8H  . insulin aspart  0-5 Units Subcutaneous QHS  . insulin aspart  0-9 Units Subcutaneous TID WC  . LORazepam  0.5 mg Oral QHS  . mometasone-formoterol  2 puff Inhalation BID    Social History   Tobacco Use  . Smoking status: Former Smoker   Packs/day: 1.50    Years: 55.00    Pack years: 82.50    Types: Cigarettes    Last attempt to quit: 08/24/2018    Years since quitting: 0.0  . Smokeless tobacco: Former Network engineer Use Topics  . Alcohol use: Not Currently  . Drug use: Never    Family History  Problem Relation Age of Onset  . Hypertension Mother   . Diabetes Mother     Review of Systems  Constitutional:+ fever, chills, diaphoresis, activity change, appetite change, fatigue and unexpected weight change.  HENT: Negative for congestion, sore throat, rhinorrhea, sneezing, trouble swallowing and sinus pressure.  Eyes: Negative for photophobia and visual disturbance.  Respiratory: Negative for cough, chest tightness, shortness of breath, wheezing and stridor.  Cardiovascular: Negative for chest pain, palpitations and leg swelling.  Gastrointestinal: Negative for nausea, vomiting, abdominal pain, diarrhea, constipation, blood in stool, abdominal distention and anal bleeding.  Genitourinary: Negative for dysuria, hematuria, flank pain and difficulty urinating.  Musculoskeletal: Negative for myalgias, back pain, joint swelling, arthralgias and gait problem.  Skin: Negative for color change, pallor, rash and wound.  Neurological: + altered mental status  Hematological: Negative for adenopathy. Does not bruise/bleed easily.  Psychiatric/Behavioral: Negative for  behavioral problems, confusion, sleep disturbance, dysphoric mood, decreased concentration and agitation.    OBJECTIVE: Temp:  [98 F (36.7 C)-102.1 F (38.9 C)] 98.2 F (36.8 C) (01/31 1403) Pulse Rate:  [60-99] 71 (01/31 1403) Resp:  [16-21] 16 (01/31 1403) BP: (100-127)/(65-87) 100/65 (01/31 1403) SpO2:  [93 %-99 %] 93 % (01/31 1403) Weight:  [99.8 kg] 99.8 kg (01/30 2055) Physical Exam  Constitutional: He is oriented to person, place, and time. He appears well-developed and well-nourished. No distress.  HENT:  Mouth/Throat: Oropharynx is clear and  moist. No oropharyngeal exudate.  Cardiovascular: Normal rate, regular rhythm and normal heart sounds. Exam reveals no gallop and no friction rub.  No murmur heard.  Pulmonary/Chest: Effort normal and breath sounds normal. No respiratory distress. He has no wheezes.  Abdominal: Soft. Bowel sounds are normal. He exhibits no distension. There is no tenderness.  Lymphadenopathy:  He has no cervical adenopathy.  Neurological: He is alert and oriented to person, place, and time.  Skin: Skin is warm and dry. No rash noted. +rash to face and back Psychiatric: He has a normal mood and affect. His behavior is normal.     LABS: Results for orders placed or performed during the hospital encounter of 09/09/18 (from the past 48 hour(s))  Lactic acid, plasma     Status: None   Collection Time: 09/09/18  9:48 PM  Result Value Ref Range   Lactic Acid, Venous 0.8 0.5 - 1.9 mmol/L    Comment: Performed at [facility], [place] [address] [place], [place] [address]  Comprehensive metabolic panel     Status: Abnormal   Collection Time: 09/09/18  9:48 PM  Result Value Ref Range   Sodium 131 (L) 135 - 145 mmol/L   Potassium 3.3 (L) 3.5 - 5.1 mmol/L   Chloride 98 98 - 111 mmol/L   CO2 24 22 - 32 mmol/L   Glucose, Bld 82 70 - 99 mg/dL   BUN 10 8 - 23 mg/dL   Creatinine, Ser 0.94 0.61 - 1.24 mg/dL   Calcium 8.3 (L) 8.9 - 10.3 mg/dL   Total Protein 6.6 6.5 - 8.1 g/dL   Albumin 3.2 (L) 3.5 - 5.0 g/dL   AST 38 15 - 41 U/L   ALT 40 0 - 44 U/L   Alkaline Phosphatase 77 38 - 126 U/L   Total Bilirubin 1.8 (H) 0.3 - 1.2 mg/dL   GFR calc non Af Amer >60 >60 mL/min   GFR calc Af Amer >60 >60 mL/min   Anion gap 9 5 - 15    Comment: Performed at [facility], [place] [address], [place], [place] [address]  CBC WITH DIFFERENTIAL     Status: Abnormal   Collection Time: 09/09/18  9:48 PM  Result Value Ref Range   WBC 12.9 (H) 4.0 - 10.5 K/uL   RBC 5.26 4.22 - 5.81 MIL/uL    Hemoglobin 15.7 13.0 - 17.0 g/dL   HCT 47.4 39.0 - 52.0 %   MCV 90.1 80.0 - 100.0 fL   MCH 29.8 26.0 - 34.0 pg   MCHC 33.1 30.0 - 36.0 g/dL   RDW 14.5 11.5 - 15.5 %   Platelets 173 150 - 400 K/uL   nRBC 0.0 0.0 - 0.2 %   Neutrophils Relative % 86 %   Neutro Abs 11.2 (H) 1.7 - 7.7 K/uL   Lymphocytes Relative 6 %   Lymphs Abs 0.8 0.7 - 4.0 K/uL   Monocytes Relative 5 %  Monocytes Absolute 0.6 0.1 - 1.0 K/uL   Eosinophils Relative 1 %   Eosinophils Absolute 0.1 0.0 - 0.5 K/uL   Basophils Relative 1 %   Basophils Absolute 0.1 0.0 - 0.1 K/uL   Immature Granulocytes 1 %   Abs Immature Granulocytes 0.09 (H) 0.00 - 0.07 K/uL    Comment: Performed at [facility], [place] [address], [place], [place] [address]  Urinalysis, Routine w reflex microscopic     Status: Abnormal   Collection Time: 09/09/18  9:48 PM  Result Value Ref Range   Color, Urine YELLOW YELLOW   APPearance CLEAR CLEAR   Specific Gravity, Urine 1.013 1.005 - 1.030   pH 6.0 5.0 - 8.0   Glucose, UA NEGATIVE NEGATIVE mg/dL   Hgb urine dipstick MODERATE (A) NEGATIVE   Bilirubin Urine NEGATIVE NEGATIVE   Ketones, ur NEGATIVE NEGATIVE mg/dL   Protein, ur NEGATIVE NEGATIVE mg/dL   Nitrite NEGATIVE NEGATIVE   Leukocytes, UA NEGATIVE NEGATIVE   RBC / HPF 21-50 0 - 5 RBC/hpf   WBC, UA 0-5 0 - 5 WBC/hpf   Bacteria, UA NONE SEEN NONE SEEN   Squamous Epithelial / LPF 0-5 0 - 5   Mucus PRESENT     Comment: Performed at [facility], [place] [address], [place], [place] [address]  Blood Culture (routine x 2)     Status: None (Preliminary result)   Collection Time: 09/09/18  9:55 PM  Result Value Ref Range   Specimen Description      BLOOD LEFT FOREARM Performed at [facility], [place] [address], [place], [place] [address]    Special Requests      BOTTLES DRAWN AEROBIC AND ANAEROBIC Blood Culture results may not be optimal due to an excessive volume of blood received in culture  bottles Performed at [place] [address], [place], [place] [address]    Culture PENDING    Report Status PENDING   CBG monitoring, ED     Status: None   Collection Time: 09/09/18  9:57 PM  Result Value Ref Range   Glucose-Capillary 80 70 - 99 mg/dL  I-stat troponin, ED     Status: None   Collection Time: 09/09/18 10:12 PM  Result Value Ref Range   Troponin i, poc 0.02 0.00 - 0.08 ng/mL   Comment 3            Comment: Due to the release kinetics of cTnI, a negative result within the first hours of the onset of symptoms does not rule out myocardial infarction with certainty. If myocardial infarction is still suspected, repeat the test at appropriate intervals.   Glucose, capillary     Status: None   Collection Time: 09/10/18 12:30 AM  Result Value Ref Range   Glucose-Capillary 90 70 - 99 mg/dL  Comprehensive metabolic panel     Status: Abnormal   Collection Time: 09/10/18  5:27 AM  Result Value Ref Range   Sodium 135 135 - 145 mmol/L   Potassium 3.4 (L) 3.5 - 5.1 mmol/L   Chloride 102 98 - 111 mmol/L   CO2 23 22 - 32 mmol/L   Glucose, Bld 97 70 - 99 mg/dL   BUN 9 8 - 23 mg/dL   Creatinine, Ser 0.91 0.61 - 1.24 mg/dL   Calcium 8.0 (L) 8.9 - 10.3 mg/dL   Total Protein 6.0 (L) 6.5 - 8.1 g/dL   Albumin 2.8 (L) 3.5 - 5.0 g/dL   AST  31 15 - 41 U/L   ALT 34 0 - 44 U/L   Alkaline Phosphatase 72 38 - 126 U/L   Total Bilirubin 1.2 0.3 - 1.2 mg/dL   GFR calc non Af Amer >60 >60 mL/min   GFR calc Af Amer >60 >60 mL/min   Anion gap 10 5 - 15    Comment: Performed at [facility], [place] [address], [place], [place] [address]  CBC     Status: Abnormal   Collection Time: 09/10/18  5:27 AM  Result Value Ref Range   WBC 13.5 (H) 4.0 - 10.5 K/uL   RBC 5.10 4.22 - 5.81 MIL/uL   Hemoglobin 15.4 13.0 - 17.0 g/dL   HCT 48.0 39.0 - 52.0 %   MCV 94.1 80.0 - 100.0 fL   MCH 30.2 26.0 - 34.0 pg   MCHC 32.1 30.0 - 36.0 g/dL   RDW 14.6 11.5 - 15.5  %   Platelets 156 150 - 400 K/uL   nRBC 0.0 0.0 - 0.2 %    Comment: Performed at [facility], [place] [address], [place], [place] [address]  Glucose, capillary     Status: None   Collection Time: 09/10/18  7:44 AM  Result Value Ref Range   Glucose-Capillary 91 70 - 99 mg/dL  Sedimentation rate     Status: None   Collection Time: 09/10/18 11:20 AM  Result Value Ref Range   Sed Rate 15 0 - 16 mm/hr    Comment: Performed at [facility], [place] [address] [place], [place] [address]  C-reactive protein     Status: Abnormal   Collection Time: 09/10/18 11:20 AM  Result Value Ref Range   CRP 13.0 (H) <1.0 mg/dL    Comment: Performed at [place] [address], [place], [place] [address]  Glucose, capillary     Status: Abnormal   Collection Time: 09/10/18 11:35 AM  Result Value Ref Range   Glucose-Capillary 114 (H) 70 - 99 mg/dL    MICRO: pending IMAGING: Dg Chest 2 View  Result Date: 09/09/2018 CLINICAL DATA:  Altered mental status EXAM: CHEST - 2 VIEW COMPARISON:  09/01/2018 FINDINGS: Prior CABG. Cardiomegaly. Low lung volumes with bibasilar atelectasis. No overt edema, effusions or acute bony abnormality. IMPRESSION: Cardiomegaly.  Low lung volumes with bibasilar atelectasis. Electronically Signed   By: Rolm Baptise M.D.   On: 09/09/2018 22:35    Assessment/Plan:  73yo M with readmission for altered mental status, fever, concern for SIRS/sepsis - - clinically there is concern for untreated infection, though it is unclear what that might be - he received 6 days for possible bloodstream infection (1 of 4 CoNS) until endocarditis was ruled out  - no localizing pain to back or abdomen or headache to prompt further imaging - would keep on abtx for next 24hr to see if any blood cx become positive. If blood cx are ngtd, would consider monitoring off of abtx  Rash = possibly due to cephalosporins. Recommend to switch to vancomycin and d/c  cefazolin.  Dr Megan Salon to see over the weekend.

## 2018-09-10 NOTE — Progress Notes (Signed)
Pharmacy Antibiotic Note  Michael Hines is a 73 y.o. male admitted on 09/09/2018 with bacteremia.  Pharmacy has been consulted for vancomycin dosing. Patient originally started on Ancef for MSSA bacteremia but may have developed rash from Ancef so ID changed abx to vancomycin  Plan: Vancomycin 2g IV x 1 then 1750mg  IV q24 - goal AUC 400-550  Height: 5\' 10"  (177.8 cm) Weight: 220 lb (99.8 kg) IBW/kg (Calculated) : 73  Temp (24hrs), Avg:99.5 F (37.5 C), Min:98 F (36.7 C), Max:102.1 F (38.9 C)  Recent Labs  Lab 09/06/18 0546 09/07/18 0607 09/08/18 1715 09/09/18 2148 09/10/18 0527  WBC 14.9* 15.9* 18.0* 12.9* 13.5*  CREATININE 0.86 0.99 1.23 0.94 0.91  LATICACIDVEN  --   --   --  0.8  --     Estimated Creatinine Clearance: 89.4 mL/min (by C-G formula based on SCr of 0.91 mg/dL).    No Known Allergies   Thank you for allowing pharmacy to be a part of this patient's care.  Kara Mead 09/10/2018 6:33 PM

## 2018-09-10 NOTE — Progress Notes (Signed)
PROGRESS NOTE    Michael Hines  YTK:[id number] DOB: Jul 27, 1946 DOA: 09/09/2018 PCP: Finis Bud, MD    Brief Narrative:  73 y.o. male with medical history significant of anxiety, B12 deficiency, back pain, type 2 diabetes, hyperlipidemia, testosterone insufficiency, aortic insufficiency with a history of aortic valve replacement who is coming to the emergency department with altered mental status and fever for 3 days.  He was recently admitted with similar symptoms after receiving the influenza vaccine.  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  TEE was done on January 28 which was negative.  He was subsequently discharged home but came back today with another fever of 103 chills as well as significant weakness.  Patient's previous blood cultures grew coag negative staph 1 out of 2 bottles.  He appears to have another episode of sepsis from undetermined organism.  Hospitalist consulted from the ER to admit.    ED Course: Temperature is 101.2 with blood pressure 119/84.  Pulse 92 and respiratory rate of 21 oxygen sat 95% on room air.  White count is 12.9.  Hemoglobin and platelets are within normal.  Sodium 131 and potassium 3.3.  The rest of the chemistry appears to be within normal.  Chest x-ray and urinalysis are all negative.  Patient is being admitted with sepsis most likely due to undetermined organism again  Assessment & Plan:   Principal Problem:   Sepsis due to undetermined organism [facility]) Active Problems:   Diabetes mellitus without complication ([place])   Hyponatremia   Bacteremia   Delirium   Hypokalemia   #1 sepsis:  -Uncertain etiology -Initially started on empiric ancef secondary to 1/2 Coag neg staph from prior admit -Remains febrile this AM -Pan cultures pending -ESR unremarkable although CRP is elevated at 13 -ID consulted and discussed with Dr. Baxter Flattery. Recommendation to transition to  empiric vancomycin -Will check cosyntropin stim test in AM to r/o adrenal insufficiency -Will check TSH  #2 diabetes:  -glucose presently stable and controlled -continue on SSI coverage as tolerated  #3 hypokalemia:  -Will replace -Repeat bmet in AM.  #4 hyponatremia:  -Most likely due to dehydration.   -Continued on hydration  #5 acute delirium:  -Unclear etiology -conversant however is still confused  DVT prophylaxis: Heparin subq Code Status: Full Family Communication: Pt in room, family not at bedside Disposition Plan: Uncertain at this time  Consultants:   ID  Procedures:     Antimicrobials: Anti-infectives (From admission, onward)   Start     Dose/Rate Route Frequency Ordered Stop   09/10/18 0000  ceFAZolin (ANCEF) IVPB 1 g/50 mL premix  Status:  Discontinued     1 g 100 mL/hr over 30 Minutes Intravenous Every 8 hours 09/09/18 2353 09/10/18 0002   09/09/18 2245  ceFAZolin (ANCEF) IVPB 2g/100 mL premix  Status:  Discontinued     2 g 200 mL/hr over 30 Minutes Intravenous Every 8 hours 09/09/18 2241 09/10/18 1827       Subjective: Confused this AM  Objective: Vitals:   09/10/18 0439 09/10/18 0924 09/10/18 1138 09/10/18 1403  BP: 117/70  106/74 100/65  Pulse: 85  90 71  Resp: '16  20 16  ' Temp: 98.4 F (36.9 C)  98 F (36.7 C) 98.2 F (36.8 C)  TempSrc: Oral  Oral Oral  SpO2: 98% 94% 99% 93%  Weight:      Height:        Intake/Output Summary (Last 24 hours) at  09/10/2018 1828 Last data filed at 09/10/2018 1143 Gross per 24 hour  Intake 1316.06 ml  Output 867 ml  Net 449.06 ml   Filed Weights   09/09/18 2055  Weight: 99.8 kg    Examination:  General exam: Appears calm and comfortable  Respiratory system: Clear to auscultation. Respiratory effort normal. Cardiovascular system: S1 & S2 heard, RRR Gastrointestinal system: Abdomen is nondistended, soft and nontender. No organomegaly or masses felt. Normal bowel sounds heard. Central  nervous system: Alert, confused. No focal neurological deficits. Extremities: Symmetric 5 x 5 power. Skin: Petechial rash over cheeks, lesions  Psychiatry: confused, affect appears normal.   Data Reviewed: I have personally reviewed following labs and imaging studies  CBC: Recent Labs  Lab 09/05/18 1018 09/06/18 0546 09/07/18 0607 09/08/18 1715 09/09/18 2148 09/10/18 0527  WBC 12.4* 14.9* 15.9* 18.0* 12.9* 13.5*  NEUTROABS 9.3* 11.1* 11.6* 14.8* 11.2*  --   HGB 15.7 16.6 17.2* 16.3 15.7 15.4  HCT 47.6 51.2 53.1* 51.0 47.4 48.0  MCV 91.7 93.8 91.6 91.2 90.1 94.1  PLT 229 232 287 269 173 242   Basic Metabolic Panel: Recent Labs  Lab 09/06/18 0546 09/07/18 0607 09/08/18 1715 09/09/18 2148 09/10/18 0527  NA 136 137 137 131* 135  K 3.5 3.5 4.0 3.3* 3.4*  CL 103 102 102 98 102  CO2 24 24 20* 24 23  GLUCOSE 131* 139* 97 82 97  BUN 5* '8 9 10 9  ' CREATININE 0.86 0.99 1.23 0.94 0.91  CALCIUM 8.5* 8.6* 9.3 8.3* 8.0*   GFR: Estimated Creatinine Clearance: 89.4 mL/min (by C-G formula based on SCr of 0.91 mg/dL). Liver Function Tests: Recent Labs  Lab 09/08/18 1715 09/09/18 2148 09/10/18 0527  AST 45* 38 31  ALT 38 40 34  ALKPHOS 63 77 72  BILITOT 1.1 1.8* 1.2  PROT 7.8 6.6 6.0*  ALBUMIN 3.1* 3.2* 2.8*   No results for input(s): LIPASE, AMYLASE in the last 168 hours. No results for input(s): AMMONIA in the last 168 hours. Coagulation Profile: No results for input(s): INR, PROTIME in the last 168 hours. Cardiac Enzymes: No results for input(s): CKTOTAL, CKMB, CKMBINDEX, TROPONINI in the last 168 hours. BNP (last 3 results) No results for input(s): PROBNP in the last 8760 hours. HbA1C: No results for input(s): HGBA1C in the last 72 hours. CBG: Recent Labs  Lab 09/09/18 2157 09/10/18 0030 09/10/18 0744 09/10/18 1135 09/10/18 1700  GLUCAP 80 90 91 114* 134*   Lipid Profile: No results for input(s): CHOL, HDL, LDLCALC, TRIG, CHOLHDL, LDLDIRECT in the last 72  hours. Thyroid Function Tests: No results for input(s): TSH, T4TOTAL, FREET4, T3FREE, THYROIDAB in the last 72 hours. Anemia Panel: No results for input(s): VITAMINB12, FOLATE, FERRITIN, TIBC, IRON, RETICCTPCT in the last 72 hours. Sepsis Labs: Recent Labs  Lab 09/09/18 2148  LATICACIDVEN 0.8    Recent Results (from the past 240 hour(s))  Culture, blood (Routine x 2)     Status: Abnormal   Collection Time: 09/01/18  5:03 AM  Result Value Ref Range Status   Specimen Description   Final    BLOOD BLOOD LEFT FOREARM Performed at [facility] [address], [place], [place] [address]    Special Requests   Final    BOTTLES DRAWN AEROBIC AND ANAEROBIC Blood Culture adequate volume Performed at [facility] [address], [place], [place] [address]    Culture  Setup Time   Final    GRAM POSITIVE COCCI AEROBIC  BOTTLE ONLY CRITICAL RESULT CALLED TO, READ BACK BY AND VERIFIED WITH: PHARMD M RENZ [id number] MLM    Culture (A)  Final    STAPHYLOCOCCUS SPECIES (COAGULASE NEGATIVE) THE SIGNIFICANCE OF ISOLATING THIS ORGANISM FROM A SINGLE SET OF BLOOD CULTURES WHEN MULTIPLE SETS ARE DRAWN IS UNCERTAIN. PLEASE NOTIFY THE MICROBIOLOGY DEPARTMENT WITHIN ONE WEEK IF SPECIATION AND SENSITIVITIES ARE REQUIRED. Performed at [facility], [place] [address] [place], [place] [address]    Report Status 09/03/2018 FINAL  Final  Culture, blood (Routine x 2)     Status: None   Collection Time: 09/01/18  5:03 AM  Result Value Ref Range Status   Specimen Description   Final    BLOOD BLOOD RIGHT FOREARM Performed at [facility] [address] [place], [place] [address]    Special Requests   Final    BOTTLES DRAWN AEROBIC AND ANAEROBIC Blood Culture adequate volume Performed at [facility] [address] [place], [place] [address]    Culture   Final    NO GROWTH 5 DAYS Performed at [facility], [place] [address] [place], [place] [address]    Report Status 09/06/2018 FINAL  Final  Blood Culture ID Panel (Reflexed)     Status: Abnormal   Collection Time: 09/01/18  5:03 AM  Result Value Ref Range Status   Enterococcus species NOT DETECTED NOT DETECTED Final   Listeria monocytogenes NOT DETECTED NOT DETECTED Final   Staphylococcus species DETECTED (A) NOT DETECTED Final    Comment: Methicillin (oxacillin) susceptible coagulase negative staphylococcus. Possible blood culture contaminant (unless isolated from more than one blood culture draw or clinical case suggests pathogenicity). No antibiotic treatment is indicated for blood  culture contaminants. CRITICAL RESULT CALLED TO, READ BACK BY AND VERIFIED WITH: Minette Brine [id number] MLM    Staphylococcus aureus (BCID) NOT DETECTED NOT DETECTED Final   Methicillin resistance NOT DETECTED NOT DETECTED Final   Streptococcus species NOT DETECTED NOT DETECTED Final   Streptococcus agalactiae NOT DETECTED NOT DETECTED Final   Streptococcus pneumoniae NOT DETECTED NOT DETECTED Final   Streptococcus pyogenes NOT DETECTED NOT DETECTED Final   Acinetobacter baumannii NOT DETECTED NOT DETECTED Final   Enterobacteriaceae species NOT DETECTED NOT DETECTED Final   Enterobacter cloacae complex NOT DETECTED NOT DETECTED Final   Escherichia coli NOT DETECTED NOT DETECTED Final   Klebsiella oxytoca NOT DETECTED NOT DETECTED Final   Klebsiella pneumoniae NOT DETECTED NOT DETECTED Final   Proteus species NOT DETECTED NOT DETECTED Final   Serratia marcescens NOT DETECTED NOT DETECTED Final   Haemophilus influenzae NOT DETECTED NOT DETECTED Final   Neisseria meningitidis NOT DETECTED NOT DETECTED Final   Pseudomonas aeruginosa NOT DETECTED NOT DETECTED Final   Candida albicans NOT DETECTED NOT DETECTED Final   Candida glabrata NOT DETECTED NOT DETECTED Final   Candida krusei NOT DETECTED NOT DETECTED Final   Candida parapsilosis NOT DETECTED NOT DETECTED Final    Candida tropicalis NOT DETECTED NOT DETECTED Final    Comment: Performed at [facility], [address], [place], [place] [address]  Urine culture     Status: None   Collection Time: 09/01/18  5:07 AM  Result Value Ref Range Status   Specimen Description   Final    URINE, CATHETERIZED Performed at [facility] [address], [place], [place] [address]    Special Requests   Final    NONE Performed at [facility]  [place] [address], [place], [place] [address]    Culture   Final    NO GROWTH Performed at [facility], [place] [address] [place], [place] [address]    Report Status 09/02/2018 FINAL  Final  CSF culture     Status: None   Collection Time: 09/01/18  6:43 AM  Result Value Ref Range Status   Specimen Description   Final    CSF Performed at [facility] [address] [place], [place] [address]    Special Requests   Final    NONE Performed at [facility], [place] [address], [place], [place] [address]    Gram Stain   Final    WBC PRESENT, PREDOMINANTLY MONONUCLEAR NO ORGANISMS SEEN CYTOSPIN SMEAR Gram Stain Report Called to,Read Back By and Verified With: SMITH,T. RN '@[id number]'  ON 1.22.2020 BY [email or web address]    Culture   Final    NO GROWTH 3 DAYS Performed at [facility], [place] [address], [place], [place] [address]    Report Status 09/04/2018 FINAL  Final  MRSA PCR Screening     Status: None   Collection Time: 09/01/18  7:55 AM  Result Value Ref Range Status   MRSA by PCR NEGATIVE NEGATIVE Final    Comment:        The GeneXpert MRSA Assay (FDA approved for NASAL specimens only), is one component of a comprehensive MRSA colonization surveillance program. It is not intended to diagnose MRSA infection nor to guide or monitor treatment for MRSA infections. Performed at [facility], [place] [address] [place], [place] [address]   Gastrointestinal Panel by PCR ,  Stool     Status: None   Collection Time: 09/02/18  8:54 AM  Result Value Ref Range Status   Campylobacter species NOT DETECTED NOT DETECTED Final   Plesimonas shigelloides NOT DETECTED NOT DETECTED Final   Salmonella species NOT DETECTED NOT DETECTED Final   Yersinia enterocolitica NOT DETECTED NOT DETECTED Final   Vibrio species NOT DETECTED NOT DETECTED Final   Vibrio cholerae NOT DETECTED NOT DETECTED Final   Enteroaggregative E coli (EAEC) NOT DETECTED NOT DETECTED Final   Enteropathogenic E coli (EPEC) NOT DETECTED NOT DETECTED Final   Enterotoxigenic E coli (ETEC) NOT DETECTED NOT DETECTED Final   Shiga like toxin producing E coli (STEC) NOT DETECTED NOT DETECTED Final   Shigella/Enteroinvasive E coli (EIEC) NOT DETECTED NOT DETECTED Final   Cryptosporidium NOT DETECTED NOT DETECTED Final   Cyclospora cayetanensis NOT DETECTED NOT DETECTED Final   Entamoeba histolytica NOT DETECTED NOT DETECTED Final   Giardia lamblia NOT DETECTED NOT DETECTED Final   Adenovirus F40/41 NOT DETECTED NOT DETECTED Final   Astrovirus NOT DETECTED NOT DETECTED Final   Norovirus GI/GII NOT DETECTED NOT DETECTED Final   Rotavirus A NOT DETECTED NOT DETECTED Final   Sapovirus (I, II, IV, and V) NOT DETECTED NOT DETECTED Final    Comment: Performed at [facility], [place] [place],  [address]  C difficile quick scan w PCR reflex     Status: None   Collection Time: 09/02/18  3:32 PM  Result Value Ref Range Status   C Diff antigen NEGATIVE NEGATIVE Final   C Diff toxin NEGATIVE NEGATIVE Final   C Diff interpretation No C. difficile detected.  Final    Comment: Performed at [facility], [place] [address], [place],  [address]  Culture, blood (routine x 2)  Status: None   Collection Time: 09/03/18  3:24 PM  Result Value Ref Range Status   Specimen Description   Final    BLOOD LEFT HAND Performed at [facility], [place] [address]  [place], [place] [address]    Special Requests   Final    BOTTLES DRAWN AEROBIC ONLY Blood Culture adequate volume Performed at [facility] [address] [place], [place] [address]    Culture   Final    NO GROWTH 5 DAYS Performed at [facility], [place] [address], [place], [place] [address]    Report Status 09/08/2018 FINAL  Final  Culture, blood (routine x 2)     Status: None   Collection Time: 09/03/18  3:29 PM  Result Value Ref Range Status   Specimen Description   Final    BLOOD RIGHT ANTECUBITAL Performed at [facility] [address], [place], [place] [address]    Special Requests   Final    BOTTLES DRAWN AEROBIC AND ANAEROBIC Blood Culture adequate volume Performed at [facility] [address], [place], [place] [address]    Culture   Final    NO GROWTH 5 DAYS Performed at [facility], [place] [address], [place], [place] [address]    Report Status 09/08/2018 FINAL  Final  Blood Culture (routine x 2)     Status: None (Preliminary result)   Collection Time: 09/09/18  9:55 PM  Result Value Ref Range Status   Specimen Description   Final    BLOOD LEFT FOREARM Performed at [facility], [place] [address] [place], [place] [address]    Special Requests   Final    BOTTLES DRAWN AEROBIC AND ANAEROBIC Blood Culture results may not be optimal due to an excessive volume of blood received in culture bottles Performed at [facility] [address], [place],  [address]    Culture PENDING  Incomplete   Report Status PENDING  Incomplete     Radiology Studies: Dg Chest 2 View  Result Date: 09/09/2018 CLINICAL DATA:  Altered mental status EXAM: CHEST - 2 VIEW COMPARISON:  09/01/2018 FINDINGS: Prior CABG. Cardiomegaly. Low lung volumes with bibasilar atelectasis. No overt edema, effusions or acute bony abnormality. IMPRESSION: Cardiomegaly.  Low lung volumes with bibasilar atelectasis.  Electronically Signed   By: Rolm Baptise M.D.   On: 09/09/2018 22:35    Scheduled Meds: . aspirin EC  81 mg Oral Daily  . atorvastatin  20 mg Oral QHS  . citalopram  40 mg Oral Daily  . [START ON 09/11/2018] cosyntropin  0.25 mg Intravenous Once  . [START ON 09/11/2018] cyanocobalamin  1,000 mcg Intramuscular Q30 days  . gabapentin  100 mg Oral TID  . glipiZIDE  10 mg Oral Q breakfast  . heparin  5,000 Units Subcutaneous Q8H  . insulin aspart  0-5 Units Subcutaneous QHS  . insulin aspart  0-9 Units Subcutaneous TID WC  . LORazepam  0.5 mg Oral QHS  . mometasone-formoterol  2 puff Inhalation BID   Continuous Infusions: . sodium chloride 100 mL/hr at 09/10/18 1138     LOS: 1 day   Marylu Lund, MD [facility] Pager On Amion  If 7PM-7AM, please contact night-coverage 09/10/2018, 6:28 PM

## 2018-09-11 LAB — ACTH STIMULATION, 3 TIME POINTS
Cortisol, 30 Min: 19.9 ug/dL
Cortisol, 60 Min: 20.9 ug/dL
Cortisol, Base: 12.3 ug/dL

## 2018-09-11 LAB — URINE CULTURE: Culture: NO GROWTH

## 2018-09-11 LAB — CBC
HEMATOCRIT: 43.5 % (ref 39.0–52.0)
HEMOGLOBIN: 13.9 g/dL (ref 13.0–17.0)
MCH: 30.1 pg (ref 26.0–34.0)
MCHC: 32 g/dL (ref 30.0–36.0)
MCV: 94.2 fL (ref 80.0–100.0)
Platelets: 147 10*3/uL — ABNORMAL LOW (ref 150–400)
RBC: 4.62 MIL/uL (ref 4.22–5.81)
RDW: 14.6 % (ref 11.5–15.5)
WBC: 12 10*3/uL — ABNORMAL HIGH (ref 4.0–10.5)
nRBC: 0 % (ref 0.0–0.2)

## 2018-09-11 LAB — COMPREHENSIVE METABOLIC PANEL
ALK PHOS: 66 U/L (ref 38–126)
ALT: 37 U/L (ref 0–44)
AST: 44 U/L — ABNORMAL HIGH (ref 15–41)
Albumin: 2.6 g/dL — ABNORMAL LOW (ref 3.5–5.0)
Anion gap: 9 (ref 5–15)
BUN: 10 mg/dL (ref 8–23)
CO2: 25 mmol/L (ref 22–32)
Calcium: 8.1 mg/dL — ABNORMAL LOW (ref 8.9–10.3)
Chloride: 101 mmol/L (ref 98–111)
Creatinine, Ser: 0.92 mg/dL (ref 0.61–1.24)
GFR calc Af Amer: >60 mL/min (ref >60)
GFR calc non Af Amer: >60 mL/min (ref >60)
Glucose, Bld: 95 mg/dL (ref 70–99)
Potassium: 3.7 mmol/L (ref 3.5–5.1)
Sodium: 135 mmol/L (ref 135–145)
Total Bilirubin: 1.3 mg/dL — ABNORMAL HIGH (ref 0.3–1.2)
Total Protein: 5.9 g/dL — ABNORMAL LOW (ref 6.5–8.1)

## 2018-09-11 LAB — GLUCOSE, CAPILLARY
Glucose-Capillary: 86 mg/dL (ref 70–99)
Glucose-Capillary: 94 mg/dL (ref 70–99)
Glucose-Capillary: 90 mg/dL (ref 70–99)
Glucose-Capillary: 111 mg/dL — ABNORMAL HIGH (ref 70–99)

## 2018-09-11 MED ORDER — CITALOPRAM HYDROBROMIDE 20 MG PO TABS
20.0000 mg | ORAL_TABLET | Freq: Every day | ORAL | Status: DC
  Administered 2018-09-12 – 2018-09-14 (×3): 20 mg via ORAL
  Filled 2018-09-11 (×3): qty 1

## 2018-09-11 MED ORDER — MORPHINE SULFATE (PF) 2 MG/ML IV SOLN
1.0000 mg | Freq: Once | INTRAVENOUS | Status: AC | PRN
  Administered 2018-09-11: 1 mg via INTRAVENOUS
  Filled 2018-09-11: qty 1

## 2018-09-11 MED ORDER — SODIUM CHLORIDE 0.9 % IV BOLUS
500.0000 mL | Freq: Once | INTRAVENOUS | Status: AC
  Administered 2018-09-11: 500 mL via INTRAVENOUS

## 2018-09-11 NOTE — Progress Notes (Signed)
[facility] for Infectious Disease   Reason for visit: Follow up on AMS, fever  Interval History: improving mentation, no fever, no positive cultures; WBC 12.  Wife at bedside.   No associated rash  Physical Exam: Constitutional:  Vitals:   09/11/18 0517 09/11/18 1431  BP: 121/71 (!) 90/58  Pulse: 66 75  Resp: 14 18  Temp: 98.4 F (36.9 C) 98.3 F (36.8 C)  SpO2: 97% 98%   patient appears in NAD, sleeping Eyes: anicteric Respiratory: Normal respiratory effort; CTA B Cardiovascular: tachy RR GI: soft, nt, nd  Review of Systems: Constitutional: negative for fevers Gastrointestinal: negative for nausea Integument/breast: negative for rash  Lab Results  Component Value Date   WBC 12.0 (H) 09/11/2018   HGB 13.9 09/11/2018   HCT 43.5 09/11/2018   MCV 94.2 09/11/2018   PLT 147 (L) 09/11/2018    Lab Results  Component Value Date   CREATININE 0.92 09/11/2018   BUN 10 09/11/2018   NA 135 09/11/2018   K 3.7 09/11/2018   CL 101 09/11/2018   CO2 25 09/11/2018    Lab Results  Component Value Date   ALT 37 09/11/2018   AST 44 (H) 09/11/2018   ALKPHOS 66 09/11/2018     Microbiology: Recent Results (from the past 240 hour(s))  Gastrointestinal Panel by PCR , Stool     Status: None   Collection Time: 09/02/18  8:54 AM  Result Value Ref Range Status   Campylobacter species NOT DETECTED NOT DETECTED Final   Plesimonas shigelloides NOT DETECTED NOT DETECTED Final   Salmonella species NOT DETECTED NOT DETECTED Final   Yersinia enterocolitica NOT DETECTED NOT DETECTED Final   Vibrio species NOT DETECTED NOT DETECTED Final   Vibrio cholerae NOT DETECTED NOT DETECTED Final   Enteroaggregative E coli (EAEC) NOT DETECTED NOT DETECTED Final   Enteropathogenic E coli (EPEC) NOT DETECTED NOT DETECTED Final   Enterotoxigenic E coli (ETEC) NOT DETECTED NOT DETECTED Final   Shiga like toxin producing E coli (STEC) NOT DETECTED NOT DETECTED Final   Shigella/Enteroinvasive E  coli (EIEC) NOT DETECTED NOT DETECTED Final   Cryptosporidium NOT DETECTED NOT DETECTED Final   Cyclospora cayetanensis NOT DETECTED NOT DETECTED Final   Entamoeba histolytica NOT DETECTED NOT DETECTED Final   Giardia lamblia NOT DETECTED NOT DETECTED Final   Adenovirus F40/41 NOT DETECTED NOT DETECTED Final   Astrovirus NOT DETECTED NOT DETECTED Final   Norovirus GI/GII NOT DETECTED NOT DETECTED Final   Rotavirus A NOT DETECTED NOT DETECTED Final   Sapovirus (I, II, IV, and V) NOT DETECTED NOT DETECTED Final    Comment: Performed at [facility], [place] [place], [place] [address]  C difficile quick scan w PCR reflex     Status: None   Collection Time: 09/02/18  3:32 PM  Result Value Ref Range Status   C Diff antigen NEGATIVE NEGATIVE Final   C Diff toxin NEGATIVE NEGATIVE Final   C Diff interpretation No C. difficile detected.  Final    Comment: Performed at [facility], [place] [address] [place], [place] [address]  Culture, blood (routine x 2)     Status: None   Collection Time: 09/03/18  3:24 PM  Result Value Ref Range Status   Specimen Description   Final    BLOOD LEFT HAND Performed at [facility] [address], [place], [place] [address]    Special Requests   Final    BOTTLES  DRAWN AEROBIC ONLY Blood Culture adequate volume Performed at [facility] [address] [place], [place] [address]    Culture   Final    NO GROWTH 5 DAYS Performed at [facility], [place] [address], [place], [place] [address]    Report Status 09/08/2018 FINAL  Final  Culture, blood (routine x 2)     Status: None   Collection Time: 09/03/18  3:29 PM  Result Value Ref Range Status   Specimen Description   Final    BLOOD RIGHT ANTECUBITAL Performed at [facility] [address] [place],  [place] [address]    Special Requests   Final    BOTTLES DRAWN AEROBIC AND ANAEROBIC Blood Culture adequate  volume Performed at [facility] [address], [place], [place] [address]    Culture   Final    NO GROWTH 5 DAYS Performed at [facility], [place] [address] [place], [place] [address]    Report Status 09/08/2018 FINAL  Final  Blood Culture (routine x 2)     Status: None (Preliminary result)   Collection Time: 09/09/18  9:46 PM  Result Value Ref Range Status   Specimen Description BLOOD RIGHT ANTECUBITAL  Final   Special Requests   Final    BOTTLES DRAWN AEROBIC AND ANAEROBIC Blood Culture results may not be optimal due to an excessive volume of blood received in culture bottles Performed at [facility], [place] [address], [place], [place] [address]    Culture NO GROWTH 1 DAY  Final   Report Status PENDING  Incomplete  Urine culture     Status: None   Collection Time: 09/09/18  9:48 PM  Result Value Ref Range Status   Specimen Description   Final    URINE, RANDOM Performed at [facility], [place] [address], [place], [place] [address]    Special Requests   Final    NONE Performed at [facility], [place] [address], [place], [place] [address]    Culture   Final    NO GROWTH Performed at [facility], [place] [address] [place], [place] [address]    Report Status 09/11/2018 FINAL  Final  Blood Culture (routine x 2)     Status: None (Preliminary result)   Collection Time: 09/09/18  9:55 PM  Result Value Ref Range Status   Specimen Description   Final    BLOOD LEFT FOREARM Performed at [facility], [place] [address] [place], [place] [address]    Special Requests   Final    BOTTLES DRAWN AEROBIC AND ANAEROBIC Blood Culture results may not be optimal due to an excessive volume of blood received in culture bottles Performed at [facility] [address], [place],  [address]    Culture NO GROWTH 1 DAY  Final   Report Status PENDING  Incomplete    Impression/Plan:    1. Fever - unclear etiology. Initial fever up to 104 in previous hospitalization, up to 102 this time.   Differential certainly is infectious but no localizing symptoms.  He has had improvement on antibiotics with his fever and AMS improving but not dramatic. Also on the differential is serotonin syndrome on SSRIs.  He recently was on Wellbutrin and had an increase dose, changed to celexa during last hospitalization.  Not thought to have been taking it per his wife in between hospitalizations.  Improving now on the Celexa.  2.  ? Serotonin syndrome - as above.  Consider weaning him off of celexa once he improves.  May benefit from symptomatic treatment for this.   3. Positive CoNS blood culture - 1 out of 4 bottles.  Doubt significance but can continue with 14 days of treatment (total) to assure clearance in light of unclear diagnosis (such as doxycycline + Keflex)

## 2018-09-11 NOTE — Progress Notes (Signed)
PROGRESS NOTE    Michael Hines  KGM:[id number] DOB: 05/06/1946 DOA: 09/09/2018 PCP: Finis Bud, MD    Brief Narrative:  73 y.o. male with medical history significant of anxiety, B12 deficiency, back pain, type 2 diabetes, hyperlipidemia, testosterone insufficiency, aortic insufficiency with a history of aortic valve replacement who is coming to the emergency department with altered mental status and fever for 3 days.  He was recently admitted with similar symptoms after receiving the influenza vaccine.  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  TEE was done on January 28 which was negative.  He was subsequently discharged home but came back today with another fever of 103 chills as well as significant weakness.  Patient's previous blood cultures grew coag negative staph 1 out of 2 bottles.  He appears to have another episode of sepsis from undetermined organism.  Hospitalist consulted from the ER to admit.    ED Course: Temperature is 101.2 with blood pressure 119/84.  Pulse 92 and respiratory rate of 21 oxygen sat 95% on room air.  White count is 12.9.  Hemoglobin and platelets are within normal.  Sodium 131 and potassium 3.3.  The rest of the chemistry appears to be within normal.  Chest x-ray and urinalysis are all negative.  Patient is being admitted with sepsis most likely due to undetermined organism again  Assessment & Plan:   Principal Problem:   Sepsis due to undetermined organism [facility]) Active Problems:   Diabetes mellitus without complication ([place])   Hyponatremia   Bacteremia   Delirium   Hypokalemia   #1 sepsis:  -Uncertain etiology -Initially started on empiric ancef secondary to 1/2 Coag neg staph from prior admit -Remains febrile this AM -Pan cultures pending -ESR unremarkable although CRP is elevated at 13 -ID consulted and discussed with Dr. Baxter Flattery. Recommendation to transition to  empiric vancomycin -Cosyntropin stim test ordered, however pt has been refusing labs -TSH normal -Now afebrile overnight  #2 diabetes:  -glucose presently stable and controlled -continue on SSI coverage as tolerated  #3 hypokalemia:  -Replaced -Will repeat bmet in  AM  #4 hyponatremia:  -Most likely due to dehydration.   -normalized following hydration  #5 acute delirium:  -Unclear etiology -conversant however is still confused -cosyntropin was ordered however pt declined blood further blood draws  DVT prophylaxis: Heparin subq Code Status: Full Family Communication: Pt in room, family at bedside Disposition Plan: Uncertain at this time  Consultants:   ID  Procedures:     Antimicrobials: Anti-infectives (From admission, onward)   Start     Dose/Rate Route Frequency Ordered Stop   09/11/18 2000  vancomycin (VANCOCIN) 1,750 mg in sodium chloride 0.9 % 500 mL IVPB     1,750 mg 250 mL/hr over 120 Minutes Intravenous Every 24 hours 09/10/18 1838     09/10/18 1900  vancomycin (VANCOCIN) 2,000 mg in sodium chloride 0.9 % 500 mL IVPB     2,000 mg 250 mL/hr over 120 Minutes Intravenous  Once 09/10/18 1838 09/10/18 2213   09/10/18 0000  ceFAZolin (ANCEF) IVPB 1 g/50 mL premix  Status:  Discontinued     1 g 100 mL/hr over 30 Minutes Intravenous Every 8 hours 09/09/18 2353 09/10/18 0002   09/09/18 2245  ceFAZolin (ANCEF) IVPB 2g/100 mL premix  Status:  Discontinued     2 g 200 mL/hr over 30 Minutes Intravenous Every 8 hours 09/09/18 2241 09/10/18 1827      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  Objective: Vitals:   09/10/18 1403 09/10/18 2016 09/10/18 2206 09/11/18 0517  BP: 100/65 109/71  121/71  Pulse: 71 78  66  Resp: 16 (!) 24  14  Temp: 98.2 F (36.8 C) 99.5 F (37.5 C)  98.4 F (36.9 C)  TempSrc: Oral Oral  Oral  SpO2: 93% 99% 98% 97%  Weight:      Height:        Intake/Output Summary (Last 24 hours) at 09/11/2018 1108 Last data filed at 09/11/2018  0600 Gross per 24 hour  Intake 2900.01 ml  Output 12 ml  Net 2888.01 ml   Filed Weights   09/09/18 2055  Weight: 99.8 kg    Examination: General exam: Awake, laying in bed, in nad Respiratory system: Normal respiratory effort, no wheezing Cardiovascular system: regular rate, s1, s2 Gastrointestinal system: Soft, nondistended, positive BS Central nervous system: CN2-12 grossly intact, strength intact Extremities: Perfused, no clubbing Skin: Normal skin turgor, no notable skin lesions seen Psychiatry: Mood normal // no visual hallucinations   Data Reviewed: I have personally reviewed following labs and imaging studies  CBC: Recent Labs  Lab 09/05/18 1018 09/06/18 0546 09/07/18 0607 09/08/18 1715 09/09/18 2148 09/10/18 0527 09/11/18 0724  WBC 12.4* 14.9* 15.9* 18.0* 12.9* 13.5* 12.0*  NEUTROABS 9.3* 11.1* 11.6* 14.8* 11.2*  --   --   HGB 15.7 16.6 17.2* 16.3 15.7 15.4 13.9  HCT 47.6 51.2 53.1* 51.0 47.4 48.0 43.5  MCV 91.7 93.8 91.6 91.2 90.1 94.1 94.2  PLT 229 232 287 269 173 156 779*   Basic Metabolic Panel: Recent Labs  Lab 09/07/18 0607 09/08/18 1715 09/09/18 2148 09/10/18 0527 09/11/18 0724  NA 137 137 131* 135 135  K 3.5 4.0 3.3* 3.4* 3.7  CL 102 102 98 102 101  CO2 24 20* _0 GLUCOSE 139* 97 82 97 95  BUN _1 CREATININE 0.99 1.23 0.94 0.91 0.92  CALCIUM 8.6* 9.3 8.3* 8.0* 8.1*   GFR: Estimated Creatinine Clearance: 88.5 mL/min (by C-G formula based on SCr of 0.92 mg/dL). Liver Function Tests: Recent Labs  Lab 09/08/18 1715 09/09/18 2148 09/10/18 0527 09/11/18 0724  AST 45* 38 31 44*  ALT 38 40 34 37  ALKPHOS 63 77 72 66  BILITOT 1.1 1.8* 1.2 1.3*  PROT 7.8 6.6 6.0* 5.9*  ALBUMIN 3.1* 3.2* 2.8* 2.6*   No results for input(s): LIPASE, AMYLASE in the last 168 hours. No results for input(s): AMMONIA in the last 168 hours. Coagulation Profile: No results for input(s): INR, PROTIME in the last 168 hours. Cardiac Enzymes: No  results for input(s): CKTOTAL, CKMB, CKMBINDEX, TROPONINI in the last 168 hours. BNP (last 3 results) No results for input(s): PROBNP in the last 8760 hours. HbA1C: No results for input(s): HGBA1C in the last 72 hours. CBG: Recent Labs  Lab 09/10/18 0744 09/10/18 1135 09/10/18 1700 09/10/18 2120 09/11/18 0731  GLUCAP 91 114* 134* 76 86   Lipid Profile: No results for input(s): CHOL, HDL, LDLCALC, TRIG, CHOLHDL, LDLDIRECT in the last 72 hours. Thyroid Function Tests: Recent Labs    09/10/18 1934  TSH 1.068   Anemia Panel: No results for input(s): VITAMINB12, FOLATE, FERRITIN, TIBC, IRON, RETICCTPCT in the last 72 hours. Sepsis Labs: Recent Labs  Lab 09/09/18 2148  LATICACIDVEN 0.8    Recent Results (from the past 240 hour(s))  Gastrointestinal Panel by PCR , Stool     Status: None   Collection  Time: 09/02/18  8:54 AM  Result Value Ref Range Status   Campylobacter species NOT DETECTED NOT DETECTED Final   Plesimonas shigelloides NOT DETECTED NOT DETECTED Final   Salmonella species NOT DETECTED NOT DETECTED Final   Yersinia enterocolitica NOT DETECTED NOT DETECTED Final   Vibrio species NOT DETECTED NOT DETECTED Final   Vibrio cholerae NOT DETECTED NOT DETECTED Final   Enteroaggregative E coli (EAEC) NOT DETECTED NOT DETECTED Final   Enteropathogenic E coli (EPEC) NOT DETECTED NOT DETECTED Final   Enterotoxigenic E coli (ETEC) NOT DETECTED NOT DETECTED Final   Shiga like toxin producing E coli (STEC) NOT DETECTED NOT DETECTED Final   Shigella/Enteroinvasive E coli (EIEC) NOT DETECTED NOT DETECTED Final   Cryptosporidium NOT DETECTED NOT DETECTED Final   Cyclospora cayetanensis NOT DETECTED NOT DETECTED Final   Entamoeba histolytica NOT DETECTED NOT DETECTED Final   Giardia lamblia NOT DETECTED NOT DETECTED Final   Adenovirus F40/41 NOT DETECTED NOT DETECTED Final   Astrovirus NOT DETECTED NOT DETECTED Final   Norovirus GI/GII NOT DETECTED NOT DETECTED Final    Rotavirus A NOT DETECTED NOT DETECTED Final   Sapovirus (I, II, IV, and V) NOT DETECTED NOT DETECTED Final    Comment: Performed at [facility], [place] [place], [place] [address]  C difficile quick scan w PCR reflex     Status: None   Collection Time: 09/02/18  3:32 PM  Result Value Ref Range Status   C Diff antigen NEGATIVE NEGATIVE Final   C Diff toxin NEGATIVE NEGATIVE Final   C Diff interpretation No C. difficile detected.  Final    Comment: Performed at [facility], [place] [address], [place], [place] [address]  Culture, blood (routine x 2)     Status: None   Collection Time: 09/03/18  3:24 PM  Result Value Ref Range Status   Specimen Description   Final    BLOOD LEFT HAND Performed at [facility] [address] [place], [place] [address]    Special Requests   Final    BOTTLES DRAWN AEROBIC ONLY Blood Culture adequate volume Performed at [facility] [address], [place], [place] [address]    Culture   Final    NO GROWTH 5 DAYS Performed at [facility], [place] [address], [place], [place] [address]    Report Status 09/08/2018 FINAL  Final  Culture, blood (routine x 2)     Status: None   Collection Time: 09/03/18  3:29 PM  Result Value Ref Range Status   Specimen Description   Final    BLOOD RIGHT ANTECUBITAL Performed at [facility] [address], [place], [place] [address]    Special Requests   Final    BOTTLES DRAWN AEROBIC AND ANAEROBIC Blood Culture adequate volume Performed at [facility] [address] [place], [place] [address]    Culture   Final    NO GROWTH 5 DAYS Performed at [facility], [place] [address] [place],  [address]    Report Status 09/08/2018 FINAL  Final  Blood Culture (routine x 2)     Status: None (Preliminary result)   Collection Time: 09/09/18  9:46 PM  Result Value Ref Range Status   Specimen  Description BLOOD RIGHT ANTECUBITAL  Final   Special Requests   Final    BOTTLES DRAWN AEROBIC AND ANAEROBIC Blood Culture results may not be optimal due to an excessive  volume of blood received in culture bottles Performed at [facility] [address] [place], [place] [address]    Culture NO GROWTH 1 DAY  Final   Report Status PENDING  Incomplete  Urine culture     Status: None   Collection Time: 09/09/18  9:48 PM  Result Value Ref Range Status   Specimen Description   Final    URINE, RANDOM Performed at [facility], [place] [address], [place], [place] [address]    Special Requests   Final    NONE Performed at [facility], [place] [address], [place], [place] [address]    Culture   Final    NO GROWTH Performed at [facility], [place] [address], [place], [place] [address]    Report Status 09/11/2018 FINAL  Final  Blood Culture (routine x 2)     Status: None (Preliminary result)   Collection Time: 09/09/18  9:55 PM  Result Value Ref Range Status   Specimen Description   Final    BLOOD LEFT FOREARM Performed at [facility], [place] [address] [place], [place] [address]    Special Requests   Final    BOTTLES DRAWN AEROBIC AND ANAEROBIC Blood Culture results may not be optimal due to an excessive volume of blood received in culture bottles Performed at [facility] [address], [place], [place] [address]    Culture NO GROWTH 1 DAY  Final   Report Status PENDING  Incomplete     Radiology Studies: Dg Chest 2 View  Result Date: 09/09/2018 CLINICAL DATA:  Altered mental status EXAM: CHEST - 2 VIEW COMPARISON:  09/01/2018 FINDINGS: Prior CABG. Cardiomegaly. Low lung volumes with bibasilar atelectasis. No overt edema, effusions or acute bony abnormality. IMPRESSION: Cardiomegaly.  Low lung volumes with bibasilar atelectasis. Electronically Signed   By: Rolm Baptise M.D.   On: 09/09/2018 22:35     Scheduled Meds: . aspirin EC  81 mg Oral Daily  . atorvastatin  20 mg Oral QHS  . citalopram  40 mg Oral Daily  . cyanocobalamin  1,000 mcg Intramuscular Q30 days  . gabapentin  100 mg Oral TID  . glipiZIDE  10 mg Oral Q breakfast  . heparin  5,000 Units Subcutaneous Q8H  . insulin aspart  0-5 Units Subcutaneous QHS  . insulin aspart  0-9 Units Subcutaneous TID WC  . LORazepam  0.5 mg Oral QHS  . mometasone-formoterol  2 puff Inhalation BID   Continuous Infusions: . sodium chloride 100 mL/hr at 09/10/18 2123  . vancomycin       LOS: 2 days   Marylu Lund, MD [facility] Pager On Amion  If 7PM-7AM, please contact night-coverage 09/11/2018, 11:08 AM

## 2018-09-11 NOTE — Progress Notes (Signed)
Patient is refusing all am labs. Witnessed him saying to phelebotomy: "You ain't drawing no more of my blood." ACTH level ordered for today which requires 3 timed labs, 2 of which are after Cortrosyn is administered. Since patient is refusing lab work, will hold off on giving medication for now. Provider on call made aware.

## 2018-09-12 DIAGNOSIS — R001 Bradycardia, unspecified: Secondary | ICD-10-CM

## 2018-09-12 LAB — COMPREHENSIVE METABOLIC PANEL
ALT: 55 U/L — ABNORMAL HIGH (ref 0–44)
AST: 67 U/L — ABNORMAL HIGH (ref 15–41)
Albumin: 2.5 g/dL — ABNORMAL LOW (ref 3.5–5.0)
Alkaline Phosphatase: 67 U/L (ref 38–126)
Anion gap: 7 (ref 5–15)
BUN: 9 mg/dL (ref 8–23)
CHLORIDE: 107 mmol/L (ref 98–111)
CO2: 24 mmol/L (ref 22–32)
Calcium: 7.7 mg/dL — ABNORMAL LOW (ref 8.9–10.3)
Creatinine, Ser: 1.08 mg/dL (ref 0.61–1.24)
GFR calc Af Amer: >60 mL/min (ref >60)
GFR calc non Af Amer: >60 mL/min (ref >60)
Glucose, Bld: 147 mg/dL — ABNORMAL HIGH (ref 70–99)
Potassium: 3.5 mmol/L (ref 3.5–5.1)
Sodium: 138 mmol/L (ref 135–145)
Total Bilirubin: 1.1 mg/dL (ref 0.3–1.2)
Total Protein: 5.4 g/dL — ABNORMAL LOW (ref 6.5–8.1)

## 2018-09-12 LAB — GLUCOSE, CAPILLARY
GLUCOSE-CAPILLARY: 189 mg/dL — AB (ref 70–99)
Glucose-Capillary: 103 mg/dL — ABNORMAL HIGH (ref 70–99)
Glucose-Capillary: 104 mg/dL — ABNORMAL HIGH (ref 70–99)
Glucose-Capillary: 108 mg/dL — ABNORMAL HIGH (ref 70–99)
Glucose-Capillary: 259 mg/dL — ABNORMAL HIGH (ref 70–99)

## 2018-09-12 LAB — CBC
HCT: 42.3 % (ref 39.0–52.0)
Hemoglobin: 13.3 g/dL (ref 13.0–17.0)
MCH: 29.4 pg (ref 26.0–34.0)
MCHC: 31.4 g/dL (ref 30.0–36.0)
MCV: 93.4 fL (ref 80.0–100.0)
NRBC: 0 % (ref 0.0–0.2)
Platelets: 150 10*3/uL (ref 150–400)
RBC: 4.53 MIL/uL (ref 4.22–5.81)
RDW: 14.8 % (ref 11.5–15.5)
WBC: 13.2 10*3/uL — ABNORMAL HIGH (ref 4.0–10.5)

## 2018-09-12 LAB — LACTIC ACID, PLASMA: Lactic Acid, Venous: 0.9 mmol/L (ref 0.5–1.9)

## 2018-09-12 MED ORDER — SODIUM CHLORIDE 0.9 % IV BOLUS
1000.0000 mL | Freq: Once | INTRAVENOUS | Status: AC
  Administered 2018-09-12: 1000 mL via INTRAVENOUS

## 2018-09-12 MED ORDER — SODIUM CHLORIDE 0.9 % IV SOLN
Freq: Once | INTRAVENOUS | Status: AC
  Administered 2018-09-12: 09:00:00 via INTRAVENOUS

## 2018-09-12 MED ORDER — HYDROCORTISONE NA SUCCINATE PF 100 MG IJ SOLR
50.0000 mg | Freq: Three times a day (TID) | INTRAMUSCULAR | Status: DC
  Administered 2018-09-12 (×2): 50 mg via INTRAVENOUS
  Filled 2018-09-12 (×2): qty 2

## 2018-09-12 NOTE — Progress Notes (Signed)
This RN noted pt's HR SB, sustaining in the 88s. Pt has been on 2L [place] throughout the shift. Pt arousable while sleeping. Pt not noted to be drowsy or lethargic when awoken. Able to answer all orientation questions appropriately. VSS. On call provider made aware. Will continue to monitor.

## 2018-09-12 NOTE — Progress Notes (Signed)
   09/11/18 2019  Vitals  Temp (!) 101.3 F (38.5 C)  Temp Source Oral  BP (!) 159/94  MAP (mmHg) 107  BP Location Left Arm  BP Method Automatic  Patient Position (if appropriate) Lying  Pulse Rate (!) 110  Resp 20  Oxygen Therapy  SpO2 100 %  O2 Device Nasal Cannula  MEWS Score  MEWS RR 0  MEWS Pulse 1  MEWS Systolic 0  MEWS LOC 0  MEWS Temp 1  MEWS Score 2  MEWS Score Color Yellow   Pt spiked temp of 101.3. Rectal temp confirmed 102.4. PRN tylenol given. On call provider made aware. New orders for 500cc bolus. MEWS protocol initiated. Will continue to monitor closely.

## 2018-09-12 NOTE — Progress Notes (Signed)
Pt with 15-bts run of Vtach. Pt in bed talking and laughing on the phone. Dr. Wyline Copas made aware. New orders given.

## 2018-09-12 NOTE — Progress Notes (Signed)
RRT returned to pt room d/t call r/t pt temperature elevated.  Pt already receiving a liter NS bolus.  PT is A/O and following commands laughing and talking.  Bedside RN received orders from NP with [facility].  Informed to follow up with any concerns and continue to monitor pt for now.

## 2018-09-12 NOTE — Progress Notes (Signed)
PROGRESS NOTE    Michael Hines  TGG:[id number] DOB: March 03, 1946 DOA: 09/09/2018 PCP: Finis Bud, MD    Brief Narrative:  73 y.o. male with medical history significant of anxiety, B12 deficiency, back pain, type 2 diabetes, hyperlipidemia, testosterone insufficiency, aortic insufficiency with a history of aortic valve replacement who is coming to the emergency department with altered mental status and fever for 3 days.  He was recently admitted with similar symptoms after receiving the influenza vaccine.  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  TEE was done on January 28 which was negative.  He was subsequently discharged home but came back today with another fever of 103 chills as well as significant weakness.  Patient's previous blood cultures grew coag negative staph 1 out of 2 bottles.  He appears to have another episode of sepsis from undetermined organism.  Hospitalist consulted from the ER to admit.    ED Course: Temperature is 101.2 with blood pressure 119/84.  Pulse 92 and respiratory rate of 21 oxygen sat 95% on room air.  White count is 12.9.  Hemoglobin and platelets are within normal.  Sodium 131 and potassium 3.3.  The rest of the chemistry appears to be within normal.  Chest x-ray and urinalysis are all negative.  Patient is being admitted with sepsis most likely due to undetermined organism again  Assessment & Plan:   Principal Problem:   Sepsis due to undetermined organism [facility]) Active Problems:   Diabetes mellitus without complication ([place])   Hyponatremia   Bacteremia   Delirium   Hypokalemia   #1 sepsis:  -Uncertain etiology -Initially started on empiric ancef secondary to 1/2 Coag neg staph from prior admit -Remains febrile this AM -Pan cultures pending -ESR unremarkable although CRP is elevated at 13 -ID consulted and discussed with Dr. Baxter Flattery. Recommendation to transition to  empiric vancomycin -Cosyntropin stim test ordered, however pt has been refusing labs -TSH normal -Fevers noted overnight  #2 diabetes:  -glucose presently stable and controlled -continue on SSI coverage as tolerated  #3 hypokalemia:  -Replaced -Recheck bmet in AM  #4 hyponatremia:  -Most likely due to dehydration.   -normalized after hydration  #5 acute delirium:  -Unclear etiology -conversant however is still confused -cosyntropin was ordered however pt declined blood further blood draws  DVT prophylaxis: Heparin subq Code Status: Full Family Communication: Pt in room, family at bedside Disposition Plan: Uncertain at this time  Consultants:   ID  Procedures:     Antimicrobials: Anti-infectives (From admission, onward)   Start     Dose/Rate Route Frequency Ordered Stop   09/11/18 2000  vancomycin (VANCOCIN) 1,750 mg in sodium chloride 0.9 % 500 mL IVPB     1,750 mg 250 mL/hr over 120 Minutes Intravenous Every 24 hours 09/10/18 1838     09/10/18 1900  vancomycin (VANCOCIN) 2,000 mg in sodium chloride 0.9 % 500 mL IVPB     2,000 mg 250 mL/hr over 120 Minutes Intravenous  Once 09/10/18 1838 09/10/18 2213   09/10/18 0000  ceFAZolin (ANCEF) IVPB 1 g/50 mL premix  Status:  Discontinued     1 g 100 mL/hr over 30 Minutes Intravenous Every 8 hours 09/09/18 2353 09/10/18 0002   09/09/18 2245  ceFAZolin (ANCEF) IVPB 2g/100 mL premix  Status:  Discontinued     2 g 200 mL/hr over 30 Minutes Intravenous Every 8 hours 09/09/18 2241 09/10/18 1827      Subjective: Reports fevers overnight  Objective: Vitals:   09/12/18 0837 09/12/18 0930 09/12/18 0955 09/12/18 1330  BP: (!) 88/54 (!) 95/47 118/63 120/68  Pulse: (!) 41 (!) 40 (!) 45 (!) 48  Resp: '18 16 16 16  '$ Temp: 97.9 F (36.6 C) 97.7 F (36.5 C)  98.2 F (36.8 C)  TempSrc: Oral Oral  Oral  SpO2: 100% 100% 100% 99%  Weight:      Height:        Intake/Output Summary (Last 24 hours) at 09/12/2018 1442 Last  data filed at 09/12/2018 1340 Gross per 24 hour  Intake 3304.31 ml  Output 1500 ml  Net 1804.31 ml   Filed Weights   09/09/18 2055  Weight: 99.8 kg    Examination: General exam: Conversant, in no acute distress Respiratory system: normal chest rise, clear, no audible wheezing Cardiovascular system: regular rhythm, s1-s2 Gastrointestinal system: Nondistended, nontender, pos BS Central nervous system: No seizures, no tremors Extremities: No cyanosis, no joint deformities Skin: No rashes, no pallor Psychiatry: Affect normal // no auditory hallucinations   Data Reviewed: I have personally reviewed following labs and imaging studies  CBC: Recent Labs  Lab 09/06/18 0546 09/07/18 0607 09/08/18 1715 09/09/18 2148 09/10/18 0527 09/11/18 0724 09/12/18 0511  WBC 14.9* 15.9* 18.0* 12.9* 13.5* 12.0* 13.2*  NEUTROABS 11.1* 11.6* 14.8* 11.2*  --   --   --   HGB 16.6 17.2* 16.3 15.7 15.4 13.9 13.3  HCT 51.2 53.1* 51.0 47.4 48.0 43.5 42.3  MCV 93.8 91.6 91.2 90.1 94.1 94.2 93.4  PLT 232 287 269 173 156 147* 983   Basic Metabolic Panel: Recent Labs  Lab 09/08/18 1715 09/09/18 2148 09/10/18 0527 09/11/18 0724 09/12/18 0511  NA 137 131* 135 135 138  K 4.0 3.3* 3.4* 3.7 3.5  CL 102 98 102 101 107  CO2 20* '24 23 25 24  '$ GLUCOSE 97 82 97 95 147*  BUN '9 10 9 10 9  '$ CREATININE 1.23 0.94 0.91 0.92 1.08  CALCIUM 9.3 8.3* 8.0* 8.1* 7.7*   GFR: Estimated Creatinine Clearance: 75.3 mL/min (by C-G formula based on SCr of 1.08 mg/dL). Liver Function Tests: Recent Labs  Lab 09/08/18 1715 09/09/18 2148 09/10/18 0527 09/11/18 0724 09/12/18 0511  AST 45* 38 31 44* 67*  ALT 38 40 34 37 55*  ALKPHOS 63 77 72 66 67  BILITOT 1.1 1.8* 1.2 1.3* 1.1  PROT 7.8 6.6 6.0* 5.9* 5.4*  ALBUMIN 3.1* 3.2* 2.8* 2.6* 2.5*   No results for input(s): LIPASE, AMYLASE in the last 168 hours. No results for input(s): AMMONIA in the last 168 hours. Coagulation Profile: No results for input(s): INR,  PROTIME in the last 168 hours. Cardiac Enzymes: No results for input(s): CKTOTAL, CKMB, CKMBINDEX, TROPONINI in the last 168 hours. BNP (last 3 results) No results for input(s): PROBNP in the last 8760 hours. HbA1C: No results for input(s): HGBA1C in the last 72 hours. CBG: Recent Labs  Lab 09/11/18 1641 09/11/18 2324 09/12/18 0744 09/12/18 0847 09/12/18 1207  GLUCAP 90 111* 103* 104* 108*   Lipid Profile: No results for input(s): CHOL, HDL, LDLCALC, TRIG, CHOLHDL, LDLDIRECT in the last 72 hours. Thyroid Function Tests: Recent Labs    09/10/18 1934  TSH 1.068   Anemia Panel: No results for input(s): VITAMINB12, FOLATE, FERRITIN, TIBC, IRON, RETICCTPCT in the last 72 hours. Sepsis Labs: Recent Labs  Lab 09/09/18 2148 09/12/18 0036  LATICACIDVEN 0.8 0.9    Recent Results (from the past 240 hour(s))  C difficile quick scan  w PCR reflex     Status: None   Collection Time: 09/02/18  3:32 PM  Result Value Ref Range Status   C Diff antigen NEGATIVE NEGATIVE Final   C Diff toxin NEGATIVE NEGATIVE Final   C Diff interpretation No C. difficile detected.  Final    Comment: Performed at [facility], [place] [address] [place], [place] [address]  Culture, blood (routine x 2)     Status: None   Collection Time: 09/03/18  3:24 PM  Result Value Ref Range Status   Specimen Description   Final    BLOOD LEFT HAND Performed at [facility] [address], [place], [place] [address]    Special Requests   Final    BOTTLES DRAWN AEROBIC ONLY Blood Culture adequate volume Performed at [facility] [address], [place], [place] [address]    Culture   Final    NO GROWTH 5 DAYS Performed at [facility], [place] [address], [place], [place] [address]    Report Status 09/08/2018 FINAL  Final  Culture, blood (routine x 2)     Status: None   Collection Time: 09/03/18  3:29 PM  Result Value Ref Range Status   Specimen  Description   Final    BLOOD RIGHT ANTECUBITAL Performed at [facility] [address] [place], [place] [address]    Special Requests   Final    BOTTLES DRAWN AEROBIC AND ANAEROBIC Blood Culture adequate volume Performed at [facility] [address], [place], [place] [address]    Culture   Final    NO GROWTH 5 DAYS Performed at [facility], [place] [address] [place], [place] [address]    Report Status 09/08/2018 FINAL  Final  Blood Culture (routine x 2)     Status: None (Preliminary result)   Collection Time: 09/09/18  9:46 PM  Result Value Ref Range Status   Specimen Description BLOOD RIGHT ANTECUBITAL  Final   Special Requests   Final    BOTTLES DRAWN AEROBIC AND ANAEROBIC Blood Culture results may not be optimal due to an excessive volume of blood received in culture bottles Performed at [facility], [place] [address] [place], [place] [address]    Culture NO GROWTH 2 DAYS  Final   Report Status PENDING  Incomplete  Urine culture     Status: None   Collection Time: 09/09/18  9:48 PM  Result Value Ref Range Status   Specimen Description   Final    URINE, RANDOM Performed at [place] [address] [place], [place] [address]    Special Requests   Final    NONE Performed at [facility], [place] [address] [place], [place] [address]    Culture   Final    NO GROWTH Performed at [facility], [place] [address] [place], [place] [address]    Report Status 09/11/2018 FINAL  Final  Blood Culture (routine x 2)     Status: None (Preliminary result)   Collection Time: 09/09/18  9:55 PM  Result Value Ref Range Status   Specimen Description   Final    BLOOD LEFT FOREARM Performed at [facility], [place] [address] [place],  [address]    Special Requests   Final    BOTTLES DRAWN AEROBIC AND ANAEROBIC Blood Culture results may not be optimal due to an excessive  volume of  blood received in culture bottles Performed at [facility], [place] [address], [place], [place] [address]    Culture NO GROWTH 2 DAYS  Final   Report Status PENDING  Incomplete     Radiology Studies: No results found.  Scheduled Meds: . aspirin EC  81 mg Oral Daily  . atorvastatin  20 mg Oral QHS  . citalopram  20 mg Oral Daily  . cyanocobalamin  1,000 mcg Intramuscular Q30 days  . gabapentin  100 mg Oral TID  . glipiZIDE  10 mg Oral Q breakfast  . heparin  5,000 Units Subcutaneous Q8H  . hydrocortisone sod succinate (SOLU-CORTEF) inj  50 mg Intravenous Q8H  . insulin aspart  0-5 Units Subcutaneous QHS  . insulin aspart  0-9 Units Subcutaneous TID WC  . LORazepam  0.5 mg Oral QHS  . mometasone-formoterol  2 puff Inhalation BID   Continuous Infusions: . sodium chloride 100 mL/hr at 09/12/18 0831  . vancomycin 1,750 mg (09/11/18 2019)     LOS: 3 days   Marylu Lund, MD [facility] Pager On Amion  If 7PM-7AM, please contact night-coverage 09/12/2018, 2:42 PM

## 2018-09-12 NOTE — Progress Notes (Signed)
[facility] for Infectious Disease   Reason for visit: Follow up on fever  Interval History: fever to 104 overnight, mental status improving though, overall improving symptomatically; no associated rash.  No new symtoms    Physical Exam: Constitutional:  Vitals:   09/12/18 0930 09/12/18 0955  BP: (!) 95/47 118/63  Pulse: (!) 40 (!) 45  Resp: 16 16  Temp: 97.7 F (36.5 C)   SpO2: 100% 100%   patient appears in NAD Respiratory: Normal respiratory effort; CTA B Cardiovascular: brady RR GI: soft, nt, nd  Review of Systems: Constitutional: negative for chills and anorexia Respiratory: negative for cough or sputum Integument/breast: negative for rash  Lab Results  Component Value Date   WBC 13.2 (H) 09/12/2018   HGB 13.3 09/12/2018   HCT 42.3 09/12/2018   MCV 93.4 09/12/2018   PLT 150 09/12/2018    Lab Results  Component Value Date   CREATININE 1.08 09/12/2018   BUN 9 09/12/2018   NA 138 09/12/2018   K 3.5 09/12/2018   CL 107 09/12/2018   CO2 24 09/12/2018    Lab Results  Component Value Date   ALT 55 (H) 09/12/2018   AST 67 (H) 09/12/2018   ALKPHOS 67 09/12/2018     Microbiology: Recent Results (from the past 240 hour(s))  C difficile quick scan w PCR reflex     Status: None   Collection Time: 09/02/18  3:32 PM  Result Value Ref Range Status   C Diff antigen NEGATIVE NEGATIVE Final   C Diff toxin NEGATIVE NEGATIVE Final   C Diff interpretation No C. difficile detected.  Final    Comment: Performed at [facility], [place] [address], [place], [place] [address]  Culture, blood (routine x 2)     Status: None   Collection Time: 09/03/18  3:24 PM  Result Value Ref Range Status   Specimen Description   Final    BLOOD LEFT HAND Performed at [facility] [address], [place], [place] [address]    Special Requests   Final    BOTTLES DRAWN AEROBIC ONLY Blood Culture adequate volume Performed at [facility] [address], [place], [place] [address]    Culture   Final    NO GROWTH 5 DAYS Performed at [facility], [place] [address] [place], [place] [address]    Report Status 09/08/2018 FINAL  Final  Culture, blood (routine x 2)     Status: None   Collection Time: 09/03/18  3:29 PM  Result Value Ref Range Status   Specimen Description   Final    BLOOD RIGHT ANTECUBITAL Performed at [facility] [address] [place], [place] [address]    Special Requests   Final    BOTTLES DRAWN AEROBIC AND ANAEROBIC Blood Culture adequate volume Performed at [facility] [address], [place], [place] [address]    Culture   Final    NO GROWTH 5 DAYS Performed at [facility], [place] [address], [place], [place] [address]    Report Status 09/08/2018 FINAL  Final  Blood Culture (routine x 2)     Status: None (Preliminary result)   Collection Time: 09/09/18  9:46 PM  Result Value Ref Range Status   Specimen Description BLOOD RIGHT ANTECUBITAL  Final   Special Requests   Final    BOTTLES DRAWN AEROBIC AND ANAEROBIC Blood Culture results may not be optimal due  to an excessive volume of blood received in culture bottles Performed at [facility] [address], [place], [place] [address]    Culture NO GROWTH 2 DAYS  Final   Report Status PENDING  Incomplete  Urine culture     Status: None   Collection Time: 09/09/18  9:48 PM  Result Value Ref Range Status   Specimen Description   Final    URINE, RANDOM Performed at [facility] [address] [place], [place] [address]    Special Requests   Final    NONE Performed at [facility], [place] [address] [place], [place] [address]    Culture   Final    NO GROWTH Performed at [facility], [place] [address], [place], [place] [address]    Report Status 09/11/2018 FINAL  Final  Blood Culture (routine x 2)     Status: None  (Preliminary result)   Collection Time: 09/09/18  9:55 PM  Result Value Ref Range Status   Specimen Description   Final    BLOOD LEFT FOREARM Performed at [facility], [place] [address] [place], [place] [address]    Special Requests   Final    BOTTLES DRAWN AEROBIC AND ANAEROBIC Blood Culture results may not be optimal due to an excessive volume of blood received in culture bottles Performed at [facility] [address], [place],  [address]    Culture NO GROWTH 2 DAYS  Final   Report Status PENDING  Incomplete    Impression/Plan:  1. Possible serotonin syndrome vs withdrawal of SSRI (taking intermittently) - hard to know since it was unknown if the patient was taking it or not.  Fever again so may be worth a quick wean off of the SSRI.  No signs of infection with no positive blood cultures, no localizing signs to consider infection.  High fever to 104 again yesterday but overall improving mental status, other symptoms.    I discussed with his wife that would be ideal to wean off of the SSRI and see how he does without that for his anxiety treatment; hopefully will be ok without pharmacologic treatment for his anxiety.     2.  Fever - likely from #1.  On antibiotics and still with fever.  No localizing symptoms.   Can continue with antibiotics for 2 weeks total  3.  Bradycardia - ? Side effect as well vs sinus issue.    I will sign off, Dr. Megan Salon on tomorrow if new issues come up. thanks

## 2018-09-12 NOTE — Progress Notes (Signed)
Pt has an elevated temperature called to evaluate.

## 2018-09-12 NOTE — Progress Notes (Signed)
   09/11/18 2200  Vitals  Temp (!) 104 F (40 C)  Temp Source Rectal  MEWS Score  MEWS RR 0  MEWS Pulse 2  MEWS Systolic 0  MEWS LOC 0  MEWS Temp 2  MEWS Score 4  MEWS Score Color Red   Pt's temp now up to 104. PRN motrin given. Pt alert and oriented with no new complaints. Rapid response called to bedside. On call provider made aware. New orders to draw lactic acid. MEWS protocol continued. Will continue to monitor.

## 2018-09-12 NOTE — Progress Notes (Signed)
Pt's BP 87/68. Manual BP 79/60. Pt with no new complaints at this time. Alert and oriented. On call provider made aware of new findings. New order for 1L bolus. Will carry out new orders and continue to monitor closely.

## 2018-09-12 NOTE — Progress Notes (Signed)
Followed up with pt nurse about pt MEWS of 4. Pt will receive Motrin, po.  Continue to monitor.

## 2018-09-13 DIAGNOSIS — R410 Disorientation, unspecified: Secondary | ICD-10-CM

## 2018-09-13 LAB — GLUCOSE, CAPILLARY
GLUCOSE-CAPILLARY: 65 mg/dL — AB (ref 70–99)
Glucose-Capillary: 103 mg/dL — ABNORMAL HIGH (ref 70–99)
Glucose-Capillary: 56 mg/dL — ABNORMAL LOW (ref 70–99)
Glucose-Capillary: 87 mg/dL (ref 70–99)
Glucose-Capillary: 100 mg/dL — ABNORMAL HIGH (ref 70–99)
Glucose-Capillary: 94 mg/dL (ref 70–99)

## 2018-09-13 LAB — COMPREHENSIVE METABOLIC PANEL
ALT: 43 U/L (ref 0–44)
AST: 36 U/L (ref 15–41)
Albumin: 2.2 g/dL — ABNORMAL LOW (ref 3.5–5.0)
Alkaline Phosphatase: 60 U/L (ref 38–126)
Anion gap: 5 (ref 5–15)
BUN: 8 mg/dL (ref 8–23)
CO2: 24 mmol/L (ref 22–32)
Calcium: 7.7 mg/dL — ABNORMAL LOW (ref 8.9–10.3)
Chloride: 111 mmol/L (ref 98–111)
Creatinine, Ser: 0.93 mg/dL (ref 0.61–1.24)
GFR calc Af Amer: >60 mL/min (ref >60)
GFR calc non Af Amer: >60 mL/min (ref >60)
Glucose, Bld: 131 mg/dL — ABNORMAL HIGH (ref 70–99)
Potassium: 3 mmol/L — ABNORMAL LOW (ref 3.5–5.1)
SODIUM: 140 mmol/L (ref 135–145)
Total Bilirubin: 0.6 mg/dL (ref 0.3–1.2)
Total Protein: 5 g/dL — ABNORMAL LOW (ref 6.5–8.1)

## 2018-09-13 LAB — CBC
HCT: 38.1 % — ABNORMAL LOW (ref 39.0–52.0)
Hemoglobin: 11.9 g/dL — ABNORMAL LOW (ref 13.0–17.0)
MCH: 29.6 pg (ref 26.0–34.0)
MCHC: 31.2 g/dL (ref 30.0–36.0)
MCV: 94.8 fL (ref 80.0–100.0)
Platelets: 153 10*3/uL (ref 150–400)
RBC: 4.02 MIL/uL — AB (ref 4.22–5.81)
RDW: 14.7 % (ref 11.5–15.5)
WBC: 9.6 10*3/uL (ref 4.0–10.5)
nRBC: 0 % (ref 0.0–0.2)

## 2018-09-13 MED ORDER — POTASSIUM CHLORIDE CRYS ER 20 MEQ PO TBCR
40.0000 meq | EXTENDED_RELEASE_TABLET | Freq: Two times a day (BID) | ORAL | Status: AC
  Administered 2018-09-13 (×2): 40 meq via ORAL
  Filled 2018-09-13 (×2): qty 2

## 2018-09-13 MED ORDER — DIPHENHYDRAMINE HCL 25 MG PO CAPS
25.0000 mg | ORAL_CAPSULE | Freq: Every evening | ORAL | Status: DC | PRN
  Administered 2018-09-13: 25 mg via ORAL
  Filled 2018-09-13: qty 1

## 2018-09-13 MED ORDER — DOXYCYCLINE HYCLATE 100 MG PO TABS
100.0000 mg | ORAL_TABLET | Freq: Two times a day (BID) | ORAL | Status: DC
  Administered 2018-09-13 – 2018-09-14 (×3): 100 mg via ORAL
  Filled 2018-09-13 (×3): qty 1

## 2018-09-13 MED ORDER — CEPHALEXIN 500 MG PO CAPS
500.0000 mg | ORAL_CAPSULE | Freq: Two times a day (BID) | ORAL | Status: DC
  Administered 2018-09-13 – 2018-09-14 (×3): 500 mg via ORAL
  Filled 2018-09-13 (×3): qty 1

## 2018-09-13 NOTE — Plan of Care (Signed)
  Problem: Health Behavior/Discharge Planning: Goal: Ability to manage health-related needs will improve Outcome: Progressing   Problem: Clinical Measurements: Goal: Ability to maintain clinical measurements within normal limits will improve Outcome: Progressing Note:  Afebrile x 24 hrs.  Goal: Will remain free from infection Outcome: Progressing Note:  Transitioned to PO abx.  Goal: Diagnostic test results will improve Outcome: Progressing

## 2018-09-13 NOTE — Care Management Important Message (Signed)
Important Message  Patient Details  Name: Michael Hines MRN: [id number] Date of Birth: 07-20-1946   Medicare Important Message Given:  Yes    Kerin Salen 09/13/2018, 12:03 [place] Message  Patient Details  Name: Michael Hines MRN: [id number] Date of Birth: 1945/08/24   Medicare Important Message Given:  Yes    Kerin Salen 09/13/2018, 12:03 PM

## 2018-09-13 NOTE — Consult Note (Addendum)
   [facility] Inpatient Consult   09/13/2018  Michael Hines August 07, 1946 [id number]   [facility] hospital liaison follow up.  Made aware by inpatient RNCM that Michael Hines is agreeable to [facility] services.  Went to bedside to speak with Michael Hines about [facility] program services. He remains agreeable and [facility] written consent obtained. [facility] folder provided.   Michael Hines states he is interested in DM education and management in "helping me to work myself out of diabetes."   Confirmed Primary Care MD is Dr. Finis Bud with [facility]. Confirmed best contact number for Michael Hines as [phone]. Michael Hines denies any concerns with medications or with transportation. He lives with his wife.   Discussed [facility] Care Management will not interfere or replace any other services such as home health.  Will make referral to [facility] for transition of care and for DM.  Made inpatient RNCM aware [facility] will follow post hospital discharge.   Marthenia Rolling, MSN-Ed, RN,BSN [facility] Liaison [phone]

## 2018-09-13 NOTE — Progress Notes (Signed)
PROGRESS NOTE    Michael Hines  HRC:[id number] DOB: 1946-01-10 DOA: 09/09/2018 PCP: Finis Bud, MD    Brief Narrative:  74 y.o. male with medical history significant of anxiety, B12 deficiency, back pain, type 2 diabetes, hyperlipidemia, testosterone insufficiency, aortic insufficiency with a history of aortic valve replacement who is coming to the emergency department with altered mental status and fever for 3 days.  He was recently admitted with similar symptoms after receiving the influenza vaccine.  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  TEE was done on January 28 which was negative.  He was subsequently discharged home but came back today with another fever of 103 chills as well as significant weakness.  Patient's previous blood cultures grew coag negative staph 1 out of 2 bottles.  He appears to have another episode of sepsis from undetermined organism.  Hospitalist consulted from the ER to admit.    ED Course: Temperature is 101.2 with blood pressure 119/84.  Pulse 92 and respiratory rate of 21 oxygen sat 95% on room air.  White count is 12.9.  Hemoglobin and platelets are within normal.  Sodium 131 and potassium 3.3.  The rest of the chemistry appears to be within normal.  Chest x-ray and urinalysis are all negative.  Patient is being admitted with sepsis most likely due to undetermined organism again  Assessment & Plan:   Principal Problem:   Sepsis due to undetermined organism [facility]) Active Problems:   Diabetes mellitus without complication ([place])   Hyponatremia   Bacteremia   Delirium   Hypokalemia   #1 sepsis from uncertain etiology:  -Uncertain etiology -Initially started on empiric ancef secondary to 1/2 Coag neg staph from prior admit -Remains febrile this AM -Pan cultures pending -ESR unremarkable although CRP is elevated at 13 -ID consulted and discussed with Dr. Baxter Flattery.  Recommendation to transition to empiric vancomycin -Cosyntropin stim test ordered, however pt has been refusing labs -TSH normal -No further fevers noted overnight. Discussed with Pharmacy. Also discussed with ID over weekend. Recommendation to transition to PO keflex and doxy empirically to complete course -Will observe overnight. If patient becomes febrile overnight, consider CT imaging at that time. Otherwise, pt is asymptomatic and afebrile at this time  #2 diabetes:  -glucose low this AM -Hold glipizide -continue on SSI coverage as tolerated  #3 hypokalemia:  -Replaced -Repeat bmet in AM  #4 hyponatremia:  -Most likely due to dehydration.   -normalized after hydration  #5 acute delirium:  -Unclear etiology -conversant however is still confused -Stim test performed this weekend, unremarkable  DVT prophylaxis: Heparin subq Code Status: Full Family Communication: Pt in room, family at bedside Disposition Plan: Uncertain at this time  Consultants:   ID  Procedures:     Antimicrobials: Anti-infectives (From admission, onward)   Start     Dose/Rate Route Frequency Ordered Stop   09/13/18 1100  doxycycline (VIBRA-TABS) tablet 100 mg     100 mg Oral Every 12 hours 09/13/18 1004 09/17/18 2359   09/13/18 1100  cephALEXin (KEFLEX) capsule 500 mg     500 mg Oral Every 12 hours 09/13/18 1004 09/17/18 2359   09/11/18 2000  vancomycin (VANCOCIN) 1,750 mg in sodium chloride 0.9 % 500 mL IVPB  Status:  Discontinued     1,750 mg 250 mL/hr over 120 Minutes Intravenous Every 24 hours 09/10/18 1838 09/13/18 1003   09/10/18 1900  vancomycin (VANCOCIN) 2,000 mg in sodium chloride 0.9 % 500  mL IVPB     2,000 mg 250 mL/hr over 120 Minutes Intravenous  Once 09/10/18 1838 09/10/18 2213   09/10/18 0000  ceFAZolin (ANCEF) IVPB 1 g/50 mL premix  Status:  Discontinued     1 g 100 mL/hr over 30 Minutes Intravenous Every 8 hours 09/09/18 2353 09/10/18 0002   09/09/18 2245  ceFAZolin  (ANCEF) IVPB 2g/100 mL premix  Status:  Discontinued     2 g 200 mL/hr over 30 Minutes Intravenous Every 8 hours 09/09/18 2241 09/10/18 1827      Subjective: Eager to go home  Objective: Vitals:   09/12/18 2034 09/13/18 0459 09/13/18 0746 09/13/18 1306  BP: 111/63 (!) 99/56  118/72  Pulse: 63 (!) 49  (!) 48  Resp:  18  13  Temp: (!) 97.3 F (36.3 C) 97.7 F (36.5 C)  97.7 F (36.5 C)  TempSrc: Axillary Oral  Oral  SpO2: 98% 90% 95% 98%  Weight:      Height:        Intake/Output Summary (Last 24 hours) at 09/13/2018 1422 Last data filed at 09/13/2018 1000 Gross per 24 hour  Intake 3230.9 ml  Output 2100 ml  Net 1130.9 ml   Filed Weights   09/09/18 2055  Weight: 99.8 kg    Examination: General exam: Conversant, in no acute distress Respiratory system: normal chest rise, clear, no audible wheezing Cardiovascular system: regular rhythm, s1-s2 Gastrointestinal system: Nondistended, nontender, pos BS Central nervous system: No seizures, no tremors Extremities: No cyanosis, no joint deformities Skin: No rashes, no pallor Psychiatry: Affect normal // no auditory hallucinations   Data Reviewed: I have personally reviewed following labs and imaging studies  CBC: Recent Labs  Lab 09/07/18 0607 09/08/18 1715 09/09/18 2148 09/10/18 0527 09/11/18 0724 09/12/18 0511 09/13/18 0520  WBC 15.9* 18.0* 12.9* 13.5* 12.0* 13.2* 9.6  NEUTROABS 11.6* 14.8* 11.2*  --   --   --   --   HGB 17.2* 16.3 15.7 15.4 13.9 13.3 11.9*  HCT 53.1* 51.0 47.4 48.0 43.5 42.3 38.1*  MCV 91.6 91.2 90.1 94.1 94.2 93.4 94.8  PLT 287 269 173 156 147* 150 482   Basic Metabolic Panel: Recent Labs  Lab 09/09/18 2148 09/10/18 0527 09/11/18 0724 09/12/18 0511 09/13/18 0520  NA 131* 135 135 138 140  K 3.3* 3.4* 3.7 3.5 3.0*  CL 98 102 101 107 111  CO2 '24 23 25 24 24  '$ GLUCOSE 82 97 95 147* 131*  BUN '10 9 10 9 8  '$ CREATININE 0.94 0.91 0.92 1.08 0.93  CALCIUM 8.3* 8.0* 8.1* 7.7* 7.7*    GFR: Estimated Creatinine Clearance: 87.5 mL/min (by C-G formula based on SCr of 0.93 mg/dL). Liver Function Tests: Recent Labs  Lab 09/09/18 2148 09/10/18 0527 09/11/18 0724 09/12/18 0511 09/13/18 0520  AST 38 31 44* 67* 36  ALT 40 34 37 55* 43  ALKPHOS 77 72 66 67 60  BILITOT 1.8* 1.2 1.3* 1.1 0.6  PROT 6.6 6.0* 5.9* 5.4* 5.0*  ALBUMIN 3.2* 2.8* 2.6* 2.5* 2.2*   No results for input(s): LIPASE, AMYLASE in the last 168 hours. No results for input(s): AMMONIA in the last 168 hours. Coagulation Profile: No results for input(s): INR, PROTIME in the last 168 hours. Cardiac Enzymes: No results for input(s): CKTOTAL, CKMB, CKMBINDEX, TROPONINI in the last 168 hours. BNP (last 3 results) No results for input(s): PROBNP in the last 8760 hours. HbA1C: No results for input(s): HGBA1C in the last 72 hours. CBG: Recent  Labs  Lab 09/12/18 1600 09/12/18 2041 09/13/18 0722 09/13/18 1139 09/13/18 1209  GLUCAP 189* 259* 103* 56* 87   Lipid Profile: No results for input(s): CHOL, HDL, LDLCALC, TRIG, CHOLHDL, LDLDIRECT in the last 72 hours. Thyroid Function Tests: Recent Labs    09/10/18 1934  TSH 1.068   Anemia Panel: No results for input(s): VITAMINB12, FOLATE, FERRITIN, TIBC, IRON, RETICCTPCT in the last 72 hours. Sepsis Labs: Recent Labs  Lab 09/09/18 2148 09/12/18 0036  LATICACIDVEN 0.8 0.9    Recent Results (from the past 240 hour(s))  Culture, blood (routine x 2)     Status: None   Collection Time: 09/03/18  3:24 PM  Result Value Ref Range Status   Specimen Description   Final    BLOOD LEFT HAND Performed at [facility], [place] [address], [place], [place] [address]    Special Requests   Final    BOTTLES DRAWN AEROBIC ONLY Blood Culture adequate volume Performed at [place] [address] [place], [place] [address]    Culture   Final    NO GROWTH 5 DAYS Performed at [facility], [place] [address]  [place], [place] [address]    Report Status 09/08/2018 FINAL  Final  Culture, blood (routine x 2)     Status: None   Collection Time: 09/03/18  3:29 PM  Result Value Ref Range Status   Specimen Description   Final    BLOOD RIGHT ANTECUBITAL Performed at [facility] [address] [place], [place] [address]    Special Requests   Final    BOTTLES DRAWN AEROBIC AND ANAEROBIC Blood Culture adequate volume Performed at [facility] [address], [place], [place] [address]    Culture   Final    NO GROWTH 5 DAYS Performed at [facility], [place] [address], [place], [place] [address]    Report Status 09/08/2018 FINAL  Final  Blood Culture (routine x 2)     Status: None (Preliminary result)   Collection Time: 09/09/18  9:46 PM  Result Value Ref Range Status   Specimen Description   Final    BLOOD RIGHT ANTECUBITAL Performed at [facility] [address] [place], [place] [address]    Special Requests   Final    BOTTLES DRAWN AEROBIC AND ANAEROBIC Blood Culture results may not be optimal due to an excessive volume of blood received in culture bottles Performed at [place] [address] [place], [place] [address]    Culture   Final    NO GROWTH 3 DAYS Performed at [facility], [place] [address], [place], [place] [address]    Report Status PENDING  Incomplete  Urine culture     Status: None   Collection Time: 09/09/18  9:48 PM  Result Value Ref Range Status   Specimen Description   Final    URINE, RANDOM Performed at [facility] [address], [place], [place] [address]    Special Requests   Final    NONE Performed at [facility], [place] [address], [place], [place] [address]    Culture   Final    NO GROWTH Performed at [facility], [place] [address], [place],  [address]    Report Status 09/11/2018 FINAL  Final  Blood Culture (routine x  2)     Status: None (Preliminary result)   Collection Time: 09/09/18  9:55 PM  Result Value Ref Range Status   Specimen Description   Final    BLOOD LEFT FOREARM Performed at [facility], [place] [address], [place], [place] [address]    Special Requests   Final    BOTTLES DRAWN AEROBIC AND ANAEROBIC Blood Culture results may not be optimal due to an excessive volume of blood received in culture bottles Performed at [facility] [address] [place], [place] [address]    Culture   Final    NO GROWTH 3 DAYS Performed at [facility], [place] [address] [place], [place] [address]    Report Status PENDING  Incomplete     Radiology Studies: No results found.  Scheduled Meds: . aspirin EC  81 mg Oral Daily  . atorvastatin  20 mg Oral QHS  . cephALEXin  500 mg Oral Q12H  . citalopram  20 mg Oral Daily  . cyanocobalamin  1,000 mcg Intramuscular Q30 days  . doxycycline  100 mg Oral Q12H  . gabapentin  100 mg Oral TID  . glipiZIDE  10 mg Oral Q breakfast  . heparin  5,000 Units Subcutaneous Q8H  . insulin aspart  0-5 Units Subcutaneous QHS  . insulin aspart  0-9 Units Subcutaneous TID WC  . LORazepam  0.5 mg Oral QHS  . mometasone-formoterol  2 puff Inhalation BID  . potassium chloride  40 mEq Oral BID   Continuous Infusions:    LOS: 4 days   Marylu Lund, MD [facility] Pager On Amion  If 7PM-7AM, please contact night-coverage 09/13/2018, 2:22 PM

## 2018-09-13 NOTE — Progress Notes (Signed)
Hypoglycemic Event  CBG:65  Treatment: 4 oz orange juice  Symptoms: none  Follow-up CBG: Time:737 CBG Result:100  Possible Reasons for Event:glipizide this AM.   Comments/MD notified:Dr Wyline Copas. No new orders.     Michael Hines

## 2018-09-13 NOTE — Progress Notes (Signed)
Hypoglycemic Event  CBG: 56  Treatment: 4 oz orange juice  Symptoms: asymptomatic  Follow-up CBG: Time:1210 CBG Result:87  Possible Reasons for Event: pt got glipizide this AM. But did eat well for breakfast.   Comments/MD notified:Dr Janey Genta, [place]

## 2018-09-13 NOTE — Progress Notes (Addendum)
Pt had a run of 5 beats vtach at 2010, non sustaining; pt is sitting in the chair, asymtomatic, no c/o Chest pain, SOB, nausea or any distress. VS: 133/80, 50HR, 98.6 temp, 99% RA. Provider made aware. AT 2036, pt had a run another set of vtach, 17beats nonsustaining, then another 3beats afterwards. Pt was walking that time when it happened. Pt has no c/o of any distress at this time. Provider made aware. Will continue to monitor patient.

## 2018-09-14 ENCOUNTER — Encounter: Payer: Self-pay | Admitting: *Deleted

## 2018-09-14 LAB — COMPREHENSIVE METABOLIC PANEL
ALBUMIN: 2.7 g/dL — AB (ref 3.5–5.0)
ALT: 45 U/L — ABNORMAL HIGH (ref 0–44)
AST: 39 U/L (ref 15–41)
Alkaline Phosphatase: 58 U/L (ref 38–126)
Anion gap: 9 (ref 5–15)
BUN: <5 mg/dL — ABNORMAL LOW (ref 8–23)
CO2: 24 mmol/L (ref 22–32)
Calcium: 8.3 mg/dL — ABNORMAL LOW (ref 8.9–10.3)
Chloride: 107 mmol/L (ref 98–111)
Creatinine, Ser: 0.98 mg/dL (ref 0.61–1.24)
GFR calc Af Amer: >60 mL/min (ref >60)
GFR calc non Af Amer: >60 mL/min (ref >60)
GLUCOSE: 130 mg/dL — AB (ref 70–99)
POTASSIUM: 3.9 mmol/L (ref 3.5–5.1)
SODIUM: 140 mmol/L (ref 135–145)
Total Bilirubin: 0.8 mg/dL (ref 0.3–1.2)
Total Protein: 5.7 g/dL — ABNORMAL LOW (ref 6.5–8.1)

## 2018-09-14 LAB — CBC
HCT: 41.3 % (ref 39.0–52.0)
Hemoglobin: 13.2 g/dL (ref 13.0–17.0)
MCH: 29.4 pg (ref 26.0–34.0)
MCHC: 32 g/dL (ref 30.0–36.0)
MCV: 92 fL (ref 80.0–100.0)
Platelets: 220 10*3/uL (ref 150–400)
RBC: 4.49 MIL/uL (ref 4.22–5.81)
RDW: 14.8 % (ref 11.5–15.5)
WBC: 8.2 10*3/uL (ref 4.0–10.5)
nRBC: 0 % (ref 0.0–0.2)

## 2018-09-14 LAB — GLUCOSE, CAPILLARY: Glucose-Capillary: 140 mg/dL — ABNORMAL HIGH (ref 70–99)

## 2018-09-14 MED ORDER — IBUPROFEN 600 MG PO TABS: 600.0000 mg | ORAL_TABLET | Freq: Four times a day (QID) | ORAL | 0 refills | Status: DC | PRN

## 2018-09-14 MED ORDER — DOXYCYCLINE HYCLATE 100 MG PO TABS: 100.0000 mg | ORAL_TABLET | Freq: Two times a day (BID) | ORAL | 0 refills | Status: AC

## 2018-09-14 MED ORDER — CITALOPRAM HYDROBROMIDE 20 MG PO TABS: 20.0000 mg | ORAL_TABLET | Freq: Every day | ORAL | 0 refills | Status: DC

## 2018-09-14 MED ORDER — CEPHALEXIN 500 MG PO CAPS: 500.0000 mg | ORAL_CAPSULE | Freq: Two times a day (BID) | ORAL | 0 refills | Status: AC

## 2018-09-14 NOTE — Discharge Summary (Signed)
Physician Discharge Summary  Michael Hines MRN:[id number] DOB: 05/29/1946 DOA: 09/09/2018  PCP: Swaney, Cathy Judge, MD  Admit date: 09/09/2018 Discharge date: 09/14/2018  Admitted From: Home Disposition:  home  Recommendations for Outpatient Follow-up:  1. Follow up with PCP in 1-2 weeks 2. Follow up with ID as scheduled  Discharge Condition:Improved CODE STATUS:Full Diet recommendation: Diabetic   Brief/Interim Summary: 72 y.o.malewith medical history significant ofanxiety, B12 deficiency, back pain, type 2 diabetes, hyperlipidemia, testosterone insufficiency, aortic insufficiency with a history of aortic valve replacement who is coming to the emergency department with altered mental status and feverfor3 days.He was recently admitted with similar symptomsafter receiving the influenza vaccine. 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 TEE was done on January 28 which was negative. He was subsequently discharged home but came back today with another fever of 103 chills as well as significant weakness. Patient's previous blood cultures grew coag negative staph 1 out of 2 bottles. He appears to have another episode of sepsis from undetermined organism. Hospitalist consulted from the ER to admit.   ED Course:Temperature is 101.2 with blood pressure 119/84.Pulse 92 and respiratory rate of 21 oxygen sat 95% on room air. White count is 12.9. Hemoglobin and platelets are within normal. Sodium 131 and potassium 3.3. The rest of the chemistry appears to be within normal. Chest x-ray and urinalysis are all negative. Patient is being admitted with sepsis most likely due to undetermined organism again  Discharge Diagnoses:  Principal Problem:   Sepsis due to undetermined organism (HCC) Active Problems:   Diabetes mellitus without complication (HCC)   Hyponatremia   Bacteremia   Delirium    Hypokalemia  #1 sepsis from uncertain etiology:  -Uncertain etiology. Infectious vs medication induced -Initially started on empiric ancef secondary to 1/2 Coag neg staph from prior admit -Remains febrile this AM -Pan cultures pending -ESR unremarkable although CRP is elevated at 13 -ID consulted and discussed with Dr. Snider. Recommendation to transition to empiric vancomycin -TSH normal -No further fevers noted overnight. Discussed with Pharmacy. Also discussed with ID over weekend. Recommendation to transition to PO keflex and doxy empirically to complete course, final day of abx is 2/7, to complete total 14 day course of antibiotics -Remained medically stable. Plan to complete course of abx and continue meds as ordered in hospital  #2 diabetes: -glucose low this admission -Hold glipizide  #3 hypokalemia:  -Replaced  #4 hyponatremia: -Most likely due to dehydration.  -normalized after hydration  #5 acute delirium: -Unclear etiology -conversant however is still confused -Stim test performed this weekend, unremarkable   Discharge Instructions  Discharge Instructions    AMB Referral to [facility]   Complete by:  As directed    Please assign to [facility] RN[facility] for transition of care and DM. Written consent obtained. Hospital readmission. Currently at [facility]. Please call with questions. Thanks. Atika Hall, MSN-Ed, RN,BSN-[facility] Hospital Liaison-[phone]   Reason for consult:  Please assign to [facility]   Diagnoses of:  Diabetes   Expected date of contact:  1-3 days (reserved for hospital discharges)     Allergies as of 09/14/2018   No Known Allergies     Medication List    STOP taking these medications   glipiZIDE 10 MG tablet Commonly known as:  GLUCOTROL   mometasone-formoterol 100-5 MCG/ACT Aero Commonly known as:  DULERA   testosterone cypionate 200 MG/ML injection Commonly known as:  DEPOTESTOSTERONE  CYPIONATE       TAKE these medications   aspirin EC 81 MG tablet Take 81 mg by mouth daily.   atorvastatin 20 MG tablet Commonly known as:  LIPITOR Take 20 mg by mouth at bedtime.   budesonide-formoterol 80-4.5 MCG/ACT inhaler Commonly known as:  SYMBICORT Inhale 2 puffs into the lungs 2 (two) times daily.   cephALEXin 500 MG capsule Commonly known as:  KEFLEX Take 1 capsule (500 mg total) by mouth every 12 (twelve) hours for 3 days.   citalopram 20 MG tablet Commonly known as:  CELEXA Take 1 tablet (20 mg total) by mouth daily for 30 days. Start taking on:  September 15, 2018 What changed:    medication strength  how much to take   cyanocobalamin 1000 MCG/ML injection Commonly known as:  (VITAMIN B-12) Inject 1,000 mcg into the muscle See admin instructions. Instructions from pharmacy:Inject 51m intramuscularly weekly for four weeks. Then 148mintramuscularly monthly thereafter.  Pt states: Pt receives every 2 weeks as needed, but not sure what for. Pt also states he has never received weekly injections.   doxycycline 100 MG tablet Commonly known as:  VIBRA-TABS Take 1 tablet (100 mg total) by mouth every 12 (twelve) hours for 3 days.   gabapentin 100 MG capsule Commonly known as:  NEURONTIN Take 1 capsule (100 mg total) by mouth 3 (three) times daily.   ibuprofen 600 MG tablet Commonly known as:  ADVIL,MOTRIN Take 1 tablet (600 mg total) by mouth every 6 (six) hours as needed for moderate pain.   loperamide 2 MG capsule Commonly known as:  IMODIUM Take 1 capsule (2 mg total) by mouth as needed for diarrhea or loose stools.   LORazepam 0.5 MG tablet Commonly known as:  ATIVAN Take 1 tablet (0.5 mg total) by mouth at bedtime.      Follow-up Information    SwFinis BudMD. Schedule an appointment as soon as possible for a visit in 1 week(s).   Specialty:  Family Medicine Contact information: [id number] [place]  [phone]      SnCarlyle BasquesMD Follow up.   Specialty:  Infectious Diseases Why:  as scheduled Contact information: [address] [place] [place] [phone]        No Known Allergies  Consultations:  ID  Procedures/Studies: Dg Chest 2 View  Result Date: 09/09/2018 CLINICAL DATA:  Altered mental status EXAM: CHEST - 2 VIEW COMPARISON:  09/01/2018 FINDINGS: Prior CABG. Cardiomegaly. Low lung volumes with bibasilar atelectasis. No overt edema, effusions or acute bony abnormality. IMPRESSION: Cardiomegaly.  Low lung volumes with bibasilar atelectasis. Electronically Signed   By: KeRolm Baptise.D.   On: 09/09/2018 22:35   Dg Chest 2 View  Result Date: 09/01/2018 CLINICAL DATA:  Suspected sepsis EXAM: CHEST - 2 VIEW COMPARISON:  06/25/2015 FINDINGS: Normal heart size. Aortic tortuosity. Prior median sternotomy for aortic valve replacement. Interstitial coarsening similar to prior. There is no edema, consolidation, effusion, or pneumothorax. Artifact from EKG leads. IMPRESSION: No acute finding when compared to prior. Electronically Signed   By: JoMonte Fantasia.D.   On: 09/01/2018 05:46   Ct Head Wo Contrast  Result Date: 09/01/2018 CLINICAL DATA:  Altered level of consciousness EXAM: CT HEAD WITHOUT CONTRAST TECHNIQUE: Contiguous axial images were obtained from the base of the skull through the vertex without intravenous contrast. COMPARISON:  None. FINDINGS: Brain: No evidence of acute infarction, hemorrhage, hydrocephalus, extra-axial collection or mass lesion/mass effect.  Central predominant cerebral volume loss. High-density at the foramen Monro is choroid/vascular. Vascular: No hyperdense vessel or unexpected calcification. Skull: Negative Sinuses/Orbits: Negative IMPRESSION: No acute finding. Electronically Signed   By: Jonathon  Watts M.D.   On: 09/01/2018 05:40    Subjective: Eager to go home  Discharge Exam: Vitals:   09/13/18  2058 09/14/18 0525  BP:  119/81  Pulse:  73  Resp:  16  Temp:  97.7 F (36.5 C)  SpO2: 97% 95%   Vitals:   09/13/18 1738 09/13/18 2024 09/13/18 2058 09/14/18 0525  BP:  133/80  119/81  Pulse:  (!) 50  73  Resp:  16  16  Temp: 97.6 F (36.4 C) 98 F (36.7 C)  97.7 F (36.5 C)  TempSrc: Oral Oral  Oral  SpO2:  99% 97% 95%  Weight:      Height:        General: Pt is alert, awake, not in acute distress Cardiovascular: RRR, S1/S2 +, no rubs, no gallops Respiratory: CTA bilaterally, no wheezing, no rhonchi Abdominal: Soft, NT, ND, bowel sounds + Extremities: no edema, no cyanosis   The results of significant diagnostics from this hospitalization (including imaging, microbiology, ancillary and laboratory) are listed below for reference.     Microbiology: Recent Results (from the past 240 hour(s))  Blood Culture (routine x 2)     Status: None (Preliminary result)   Collection Time: 09/09/18  9:46 PM  Result Value Ref Range Status   Specimen Description   Final    BLOOD RIGHT ANTECUBITAL Performed at [facility], [address] [place], [place] [address]    Special Requests   Final    BOTTLES DRAWN AEROBIC AND ANAEROBIC Blood Culture results may not be optimal due to an excessive volume of blood received in culture bottles Performed at [facility], [address] [place], [place] [address]    Culture   Final    NO GROWTH 4 DAYS Performed at [facility], [address] [place], [place] [address]    Report Status PENDING  Incomplete  Urine culture     Status: None   Collection Time: 09/09/18  9:48 PM  Result Value Ref Range Status   Specimen Description   Final    URINE, RANDOM Performed at [facility], [address] [place], [place] [address]    Special Requests   Final    NONE Performed at [facility], [address] [place], [place] [address]    Culture   Final    NO  GROWTH Performed at [facility], [address] [place], [place] [address]    Report Status 09/11/2018 FINAL  Final  Blood Culture (routine x 2)     Status: None (Preliminary result)   Collection Time: 09/09/18  9:55 PM  Result Value Ref Range Status   Specimen Description   Final    BLOOD LEFT FOREARM Performed at [facility], [address] [place], [place] [address]    Special Requests   Final    BOTTLES DRAWN AEROBIC AND ANAEROBIC Blood Culture results may not be optimal due to an excessive volume of blood received in culture bottles Performed at [facility], [address] [place], [place] [address]    Culture   Final    NO GROWTH 4 DAYS Performed at [facility], [address] [place],  [address]      Report Status PENDING  Incomplete     Labs: BNP (last 3 results) No results for input(s): BNP in the last 8760 hours. Basic Metabolic Panel: Recent Labs  Lab 09/10/18 0527 09/11/18 0724 09/12/18 0511 09/13/18 0520 09/14/18 0546  NA 135 135 138 140 140  K 3.4* 3.7 3.5 3.0* 3.9  CL 102 101 107 111 107  CO2 23 25 24 24 24  GLUCOSE 97 95 147* 131* 130*  BUN 9 10 9 8 <5*  CREATININE 0.91 0.92 1.08 0.93 0.98  CALCIUM 8.0* 8.1* 7.7* 7.7* 8.3*   Liver Function Tests: Recent Labs  Lab 09/10/18 0527 09/11/18 0724 09/12/18 0511 09/13/18 0520 09/14/18 0546  AST 31 44* 67* 36 39  ALT 34 37 55* 43 45*  ALKPHOS 72 66 67 60 58  BILITOT 1.2 1.3* 1.1 0.6 0.8  PROT 6.0* 5.9* 5.4* 5.0* 5.7*  ALBUMIN 2.8* 2.6* 2.5* 2.2* 2.7*   No results for input(s): LIPASE, AMYLASE in the last 168 hours. No results for input(s): AMMONIA in the last 168 hours. CBC: Recent Labs  Lab 09/08/18 1715 09/09/18 2148 09/10/18 0527 09/11/18 0724 09/12/18 0511 09/13/18 0520 09/14/18 0546  WBC 18.0* 12.9* 13.5* 12.0* 13.2* 9.6 8.2  NEUTROABS 14.8* 11.2*  --   --   --   --   --   HGB 16.3 15.7 15.4 13.9 13.3 11.9* 13.2  HCT 51.0 47.4 48.0  43.5 42.3 38.1* 41.3  MCV 91.2 90.1 94.1 94.2 93.4 94.8 92.0  PLT 269 173 156 147* 150 153 220   Cardiac Enzymes: No results for input(s): CKTOTAL, CKMB, CKMBINDEX, TROPONINI in the last 168 hours. BNP: Invalid input(s): POCBNP CBG: Recent Labs  Lab 09/13/18 1209 09/13/18 1653 09/13/18 1737 09/13/18 2050 09/14/18 0732  GLUCAP 87 65* 100* 94 140*   D-Dimer No results for input(s): DDIMER in the last 72 hours. Hgb A1c No results for input(s): HGBA1C in the last 72 hours. Lipid Profile No results for input(s): CHOL, HDL, LDLCALC, TRIG, CHOLHDL, LDLDIRECT in the last 72 hours. Thyroid function studies No results for input(s): TSH, T4TOTAL, T3FREE, THYROIDAB in the last 72 hours.  Invalid input(s): FREET3 Anemia work up No results for input(s): VITAMINB12, FOLATE, FERRITIN, TIBC, IRON, RETICCTPCT in the last 72 hours. Urinalysis    Component Value Date/Time   COLORURINE YELLOW 09/09/2018 2148   APPEARANCEUR CLEAR 09/09/2018 2148   LABSPEC 1.013 09/09/2018 2148   PHURINE 6.0 09/09/2018 2148   GLUCOSEU NEGATIVE 09/09/2018 2148   HGBUR MODERATE (A) 09/09/2018 2148   BILIRUBINUR NEGATIVE 09/09/2018 2148   KETONESUR NEGATIVE 09/09/2018 2148   PROTEINUR NEGATIVE 09/09/2018 2148   NITRITE NEGATIVE 09/09/2018 2148   LEUKOCYTESUR NEGATIVE 09/09/2018 2148   Sepsis Labs Invalid input(s): PROCALCITONIN,  WBC,  LACTICIDVEN Microbiology Recent Results (from the past 240 hour(s))  Blood Culture (routine x 2)     Status: None (Preliminary result)   Collection Time: 09/09/18  9:46 PM  Result Value Ref Range Status   Specimen Description   Final    BLOOD RIGHT ANTECUBITAL Performed at [facility], [address] [place], [place] [address]    Special Requests   Final    BOTTLES DRAWN AEROBIC AND ANAEROBIC Blood Culture results may not be optimal due to an excessive volume of blood received in culture bottles Performed at [facility], [address] [place], [place] [address]    Culture   Final    NO GROWTH 4 DAYS Performed at [facility], [address] [place], [place] [address]    Report Status PENDING  Incomplete  Urine culture     Status: None   Collection Time: 09/09/18  9:48 PM  Result Value Ref Range Status   Specimen Description   Final    URINE, RANDOM Performed at [facility], [address] [place], [place] [address]    Special Requests   Final    NONE Performed at [facility], [address] [place], [place] [address]    Culture   Final    NO GROWTH Performed at [facility], [address] [place], [place] [address]    Report Status 09/11/2018 FINAL  Final  Blood Culture (routine x 2)     Status: None (Preliminary result)   Collection Time: 09/09/18  9:55 PM  Result Value Ref Range Status   Specimen Description   Final    BLOOD LEFT FOREARM Performed at [facility], [address] [place], [place] [address]    Special Requests   Final    BOTTLES DRAWN AEROBIC AND ANAEROBIC Blood Culture results may not be optimal due to an excessive volume of blood received in culture bottles Performed at [facility], [address] [place], [place] [address]    Culture   Final    NO GROWTH 4 DAYS Performed at [facility], [address] , [place] [address]    Report Status PENDING  Incomplete   Time spent: 30min  SIGNED:   Stephen Chiu, MD  [facility] 09/14/2018, 12:29 PM  If 7PM-7AM, please contact night-coverage  

## 2018-09-14 NOTE — Progress Notes (Deleted)
Physician Discharge Summary  Michael Hines MRN:[id number] DOB: 12/18/1945 DOA: 09/09/2018  PCP: Swaney, Cathy Judge, MD  Admit date: 09/09/2018 Discharge date: 09/14/2018  Admitted From: Home Disposition:  home  Recommendations for Outpatient Follow-up:  1. Follow up with PCP in 1-2 weeks 2. Follow up with ID as scheduled  Discharge Condition:Improved CODE STATUS:Full Diet recommendation: Diabetic   Brief/Interim Summary: 73 y.o.malewith medical history significant ofanxiety, B12 deficiency, back pain, type 2 diabetes, hyperlipidemia, testosterone insufficiency, aortic insufficiency with a history of aortic valve replacement who is coming to the emergency department with altered mental status and feverfor3 days.He was recently admitted with similar symptomsafter receiving the influenza vaccine. 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 TEE was done on January 28 which was negative. He was subsequently discharged home but came back today with another fever of 103 chills as well as significant weakness. Patient's previous blood cultures grew coag negative staph 1 out of 2 bottles. He appears to have another episode of sepsis from undetermined organism. Hospitalist consulted from the ER to admit.   ED Course:Temperature is 101.2 with blood pressure 119/84.Pulse 92 and respiratory rate of 21 oxygen sat 95% on room air. White count is 12.9. Hemoglobin and platelets are within normal. Sodium 131 and potassium 3.3. The rest of the chemistry appears to be within normal. Chest x-ray and urinalysis are all negative. Patient is being admitted with sepsis most likely due to undetermined organism again  Discharge Diagnoses:  Principal Problem:   Sepsis due to undetermined organism (HCC) Active Problems:   Diabetes mellitus without complication (HCC)   Hyponatremia   Bacteremia   Delirium    Hypokalemia  #1 sepsis from uncertain etiology:  -Uncertain etiology. Infectious vs medication induced -Initially started on empiric ancef secondary to 1/2 Coag neg staph from prior admit -Remains febrile this AM -Pan cultures pending -ESR unremarkable although CRP is elevated at 13 -ID consulted and discussed with Dr. Snider. Recommendation to transition to empiric vancomycin -TSH normal -No further fevers noted overnight. Discussed with Pharmacy. Also discussed with ID over weekend. Recommendation to transition to PO keflex and doxy empirically to complete course, final day of abx is 2/7, to complete total 14 day course of antibiotics -Remained medically stable. Plan to complete course of abx and continue meds as ordered in hospital  #2 diabetes: -glucose low this admission -Hold glipizide  #3 hypokalemia:  -Replaced  #4 hyponatremia: -Most likely due to dehydration.  -normalized after hydration  #5 acute delirium: -Unclear etiology -conversant however is still confused -Stim test performed this weekend, unremarkable   Discharge Instructions  Discharge Instructions    AMB Referral to [facility]   Complete by:  As directed    Please assign to [facility] RNCM for transition of care and DM. Written consent obtained. Hospital readmission. Currently at [facility]. Please call with questions. Thanks. Atika Hall, MSN-Ed, RN,BSN-[facility] Hospital Liaison-[phone]   Reason for consult:  Please assign to [facility] RN[facility]   Diagnoses of:  Diabetes   Expected date of contact:  1-3 days (reserved for hospital discharges)     Allergies as of 09/14/2018   No Known Allergies     Medication List    STOP taking these medications   glipiZIDE 10 MG tablet Commonly known as:  GLUCOTROL   mometasone-formoterol 100-5 MCG/ACT Aero Commonly known as:  DULERA   testosterone cypionate 200 MG/ML injection Commonly known as:  DEPOTESTOSTERONE  CYPIONATE       TAKE these medications   aspirin EC 81 MG tablet Take 81 mg by mouth daily.   atorvastatin 20 MG tablet Commonly known as:  LIPITOR Take 20 mg by mouth at bedtime.   budesonide-formoterol 80-4.5 MCG/ACT inhaler Commonly known as:  SYMBICORT Inhale 2 puffs into the lungs 2 (two) times daily.   cephALEXin 500 MG capsule Commonly known as:  KEFLEX Take 1 capsule (500 mg total) by mouth every 12 (twelve) hours for 3 days.   citalopram 20 MG tablet Commonly known as:  CELEXA Take 1 tablet (20 mg total) by mouth daily for 30 days. Start taking on:  September 15, 2018 What changed:    medication strength  how much to take   cyanocobalamin 1000 MCG/ML injection Commonly known as:  (VITAMIN B-12) Inject 1,000 mcg into the muscle See admin instructions. Instructions from pharmacy:Inject 6m intramuscularly weekly for four weeks. Then 157mintramuscularly monthly thereafter.  Pt states: Pt receives every 2 weeks as needed, but not sure what for. Pt also states he has never received weekly injections.   doxycycline 100 MG tablet Commonly known as:  VIBRA-TABS Take 1 tablet (100 mg total) by mouth every 12 (twelve) hours for 3 days.   gabapentin 100 MG capsule Commonly known as:  NEURONTIN Take 1 capsule (100 mg total) by mouth 3 (three) times daily.   ibuprofen 600 MG tablet Commonly known as:  ADVIL,MOTRIN Take 1 tablet (600 mg total) by mouth every 6 (six) hours as needed for moderate pain.   loperamide 2 MG capsule Commonly known as:  IMODIUM Take 1 capsule (2 mg total) by mouth as needed for diarrhea or loose stools.   LORazepam 0.5 MG tablet Commonly known as:  ATIVAN Take 1 tablet (0.5 mg total) by mouth at bedtime.      Follow-up Information    SwFinis BudMD. Schedule an appointment as soon as possible for a visit in 1 week(s).   Specialty:  Family Medicine Contact information: [id number] [place]  [phone]      SnCarlyle BasquesMD Follow up.   Specialty:  Infectious Diseases Why:  as scheduled Contact information: [address] [place] [place] [phone]        No Known Allergies  Consultations:  ID  Procedures/Studies: Dg Chest 2 View  Result Date: 09/09/2018 CLINICAL DATA:  Altered mental status EXAM: CHEST - 2 VIEW COMPARISON:  09/01/2018 FINDINGS: Prior CABG. Cardiomegaly. Low lung volumes with bibasilar atelectasis. No overt edema, effusions or acute bony abnormality. IMPRESSION: Cardiomegaly.  Low lung volumes with bibasilar atelectasis. Electronically Signed   By: KeRolm Baptise.D.   On: 09/09/2018 22:35   Dg Chest 2 View  Result Date: 09/01/2018 CLINICAL DATA:  Suspected sepsis EXAM: CHEST - 2 VIEW COMPARISON:  06/25/2015 FINDINGS: Normal heart size. Aortic tortuosity. Prior median sternotomy for aortic valve replacement. Interstitial coarsening similar to prior. There is no edema, consolidation, effusion, or pneumothorax. Artifact from EKG leads. IMPRESSION: No acute finding when compared to prior. Electronically Signed   By: JoMonte Fantasia.D.   On: 09/01/2018 05:46   Ct Head Wo Contrast  Result Date: 09/01/2018 CLINICAL DATA:  Altered level of consciousness EXAM: CT HEAD WITHOUT CONTRAST TECHNIQUE: Contiguous axial images were obtained from the base of the skull through the vertex without intravenous contrast. COMPARISON:  None. FINDINGS: Brain: No evidence of acute infarction, hemorrhage, hydrocephalus, extra-axial collection or mass lesion/mass effect.  Central predominant cerebral volume loss. 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 Vascular: No hyperdense vessel or unexpected calcification. Skull: Negative Sinuses/Orbits: Negative IMPRESSION: No acute finding. Electronically Signed   By: Monte Fantasia M.D.   On: 09/01/2018 05:40     Subjective: Eager to go home  Discharge Exam: Vitals:   09/13/18  2058 09/14/18 0525  BP:  119/81  Pulse:  73  Resp:  16  Temp:  97.7 F (36.5 C)  SpO2: 97% 95%   Vitals:   09/13/18 1738 09/13/18 2024 09/13/18 2058 09/14/18 0525  BP:  133/80  119/81  Pulse:  (!) 50  73  Resp:  16  16  Temp: 97.6 F (36.4 C) 98 F (36.7 C)  97.7 F (36.5 C)  TempSrc: Oral Oral  Oral  SpO2:  99% 97% 95%  Weight:      Height:        General: Pt is alert, awake, not in acute distress Cardiovascular: RRR, S1/S2 +, no rubs, no gallops Respiratory: CTA bilaterally, no wheezing, no rhonchi Abdominal: Soft, NT, ND, bowel sounds + Extremities: no edema, no cyanosis   The results of significant diagnostics from this hospitalization (including imaging, microbiology, ancillary and laboratory) are listed below for reference.     Microbiology: Recent Results (from the past 240 hour(s))  Blood Culture (routine x 2)     Status: None (Preliminary result)   Collection Time: 09/09/18  9:46 PM  Result Value Ref Range Status   Specimen Description   Final    BLOOD RIGHT ANTECUBITAL Performed at [facility] [address], [place], [place] [address]    Special Requests   Final    BOTTLES DRAWN AEROBIC AND ANAEROBIC Blood Culture results may not be optimal due to an excessive volume of blood received in culture bottles Performed at [facility] [address], [place], [place] [address]    Culture   Final    NO GROWTH 4 DAYS Performed at [facility], [place] [address] [place], [place] [address]    Report Status PENDING  Incomplete  Urine culture     Status: None   Collection Time: 09/09/18  9:48 PM  Result Value Ref Range Status   Specimen Description   Final    URINE, RANDOM Performed at [facility] [address] [place], [place] [address]    Special Requests   Final    NONE Performed at [facility], [place] [address], [place], [place] [address]    Culture   Final    NO  GROWTH Performed at [facility], [place] [address], [place], [place] [address]    Report Status 09/11/2018 FINAL  Final  Blood Culture (routine x 2)     Status: None (Preliminary result)   Collection Time: 09/09/18  9:55 PM  Result Value Ref Range Status   Specimen Description   Final    BLOOD LEFT FOREARM Performed at [facility], [place] [address], [place], [place] [address]    Special Requests   Final    BOTTLES DRAWN AEROBIC AND ANAEROBIC Blood Culture results may not be optimal due to an excessive volume of blood received in culture bottles Performed at [facility] [address], [place], [place] [address]    Culture   Final    NO GROWTH 4 DAYS Performed at [facility], [place] [address] [place], [place] [address]  Report Status PENDING  Incomplete     Labs: BNP (last 3 results) No results for input(s): BNP in the last 8760 hours. Basic Metabolic Panel: Recent Labs  Lab 09/10/18 0527 09/11/18 0724 09/12/18 0511 09/13/18 0520 09/14/18 0546  NA 135 135 138 140 140  K 3.4* 3.7 3.5 3.0* 3.9  CL 102 101 107 111 107  CO2 23 25 24 24 24  GLUCOSE 97 95 147* 131* 130*  BUN 9 10 9 8 <5*  CREATININE 0.91 0.92 1.08 0.93 0.98  CALCIUM 8.0* 8.1* 7.7* 7.7* 8.3*   Liver Function Tests: Recent Labs  Lab 09/10/18 0527 09/11/18 0724 09/12/18 0511 09/13/18 0520 09/14/18 0546  AST 31 44* 67* 36 39  ALT 34 37 55* 43 45*  ALKPHOS 72 66 67 60 58  BILITOT 1.2 1.3* 1.1 0.6 0.8  PROT 6.0* 5.9* 5.4* 5.0* 5.7*  ALBUMIN 2.8* 2.6* 2.5* 2.2* 2.7*   No results for input(s): LIPASE, AMYLASE in the last 168 hours. No results for input(s): AMMONIA in the last 168 hours. CBC: Recent Labs  Lab 09/08/18 1715 09/09/18 2148 09/10/18 0527 09/11/18 0724 09/12/18 0511 09/13/18 0520 09/14/18 0546  WBC 18.0* 12.9* 13.5* 12.0* 13.2* 9.6 8.2  NEUTROABS 14.8* 11.2*  --   --   --   --   --   HGB 16.3 15.7 15.4 13.9 13.3 11.9* 13.2  HCT 51.0 47.4 48.0  43.5 42.3 38.1* 41.3  MCV 91.2 90.1 94.1 94.2 93.4 94.8 92.0  PLT 269 173 156 147* 150 153 220   Cardiac Enzymes: No results for input(s): CKTOTAL, CKMB, CKMBINDEX, TROPONINI in the last 168 hours. BNP: Invalid input(s): POCBNP CBG: Recent Labs  Lab 09/13/18 1209 09/13/18 1653 09/13/18 1737 09/13/18 2050 09/14/18 0732  GLUCAP 87 65* 100* 94 140*   D-Dimer No results for input(s): DDIMER in the last 72 hours. Hgb A1c No results for input(s): HGBA1C in the last 72 hours. Lipid Profile No results for input(s): CHOL, HDL, LDLCALC, TRIG, CHOLHDL, LDLDIRECT in the last 72 hours. Thyroid function studies No results for input(s): TSH, T4TOTAL, T3FREE, THYROIDAB in the last 72 hours.  Invalid input(s): FREET3 Anemia work up No results for input(s): VITAMINB12, FOLATE, FERRITIN, TIBC, IRON, RETICCTPCT in the last 72 hours. Urinalysis    Component Value Date/Time   COLORURINE YELLOW 09/09/2018 2148   APPEARANCEUR CLEAR 09/09/2018 2148   LABSPEC 1.013 09/09/2018 2148   PHURINE 6.0 09/09/2018 2148   GLUCOSEU NEGATIVE 09/09/2018 2148   HGBUR MODERATE (A) 09/09/2018 2148   BILIRUBINUR NEGATIVE 09/09/2018 2148   KETONESUR NEGATIVE 09/09/2018 2148   PROTEINUR NEGATIVE 09/09/2018 2148   NITRITE NEGATIVE 09/09/2018 2148   LEUKOCYTESUR NEGATIVE 09/09/2018 2148   Sepsis Labs Invalid input(s): PROCALCITONIN,  WBC,  LACTICIDVEN Microbiology Recent Results (from the past 240 hour(s))  Blood Culture (routine x 2)     Status: None (Preliminary result)   Collection Time: 09/09/18  9:46 PM  Result Value Ref Range Status   Specimen Description   Final    BLOOD RIGHT ANTECUBITAL Performed at [facility], [address] [place], [place] [address]    Special Requests   Final    BOTTLES DRAWN AEROBIC AND ANAEROBIC Blood Culture results may not be optimal due to an excessive volume of blood received in culture bottles Performed at [facility], [address] [place], [place] [address]    Culture   Final    NO GROWTH 4 DAYS Performed at [facility], [place] [address] [place], [place] [address]    Report Status PENDING  Incomplete  Urine culture     Status: None   Collection Time: 09/09/18  9:48 PM  Result Value Ref Range Status   Specimen Description   Final    URINE, RANDOM Performed at [facility] [address] [place], [place] [address]    Special Requests   Final    NONE Performed at [facility], [place] [address] [place], [place] [address]    Culture   Final    NO GROWTH Performed at [facility], [place] [address], [place], [place] [address]    Report Status 09/11/2018 FINAL  Final  Blood Culture (routine x 2)     Status: None (Preliminary result)   Collection Time: 09/09/18  9:55 PM  Result Value Ref Range Status   Specimen Description   Final    BLOOD LEFT FOREARM Performed at [facility], [place] [address] [place], [place] [address]    Special Requests   Final    BOTTLES DRAWN AEROBIC AND ANAEROBIC Blood Culture results may not be optimal due to an excessive volume of blood received in culture bottles Performed at [facility] [address] [place], [place] [address]    Culture   Final    NO GROWTH 4 DAYS Performed at [facility], [place] [address], [place], [place] [address]    Report Status PENDING  Incomplete   Time spent: 53mn  SIGNED:   SMarylu Lund MD  [facility] 09/14/2018, 11:17 AM  If 7PM-7AM, please contact night-coverage

## 2018-09-15 LAB — CULTURE, BLOOD (ROUTINE X 2)
Culture: NO GROWTH
Culture: NO GROWTH

## 2018-09-16 ENCOUNTER — Encounter: Payer: Self-pay | Admitting: *Deleted

## 2018-09-16 ENCOUNTER — Other Ambulatory Visit: Payer: Self-pay | Admitting: *Deleted

## 2018-09-16 NOTE — Patient Outreach (Signed)
[facility]) Care Management  09/16/2018  JAMIERE GULAS 02-27-1946 [id number]    Initial outreach attempt  to patient. No answer. RN CM left HIPAA compliant voicemail message along with contact info. RN CM will send unsuccessful outreach letter to patient, wait 10 business days along with a  final call attempt and close case if no response from patient.  Will rescheduled another outreach call next week.  Raina Mina, RN Care Management Coordinator [place] Office [phone]

## 2018-09-17 DIAGNOSIS — E871 Hypo-osmolality and hyponatremia: Secondary | ICD-10-CM | POA: Diagnosis not present

## 2018-09-17 DIAGNOSIS — Z7689 Persons encountering health services in other specified circumstances: Secondary | ICD-10-CM | POA: Diagnosis not present

## 2018-09-17 DIAGNOSIS — E876 Hypokalemia: Secondary | ICD-10-CM | POA: Diagnosis not present

## 2018-09-17 DIAGNOSIS — Z09 Encounter for follow-up examination after completed treatment for conditions other than malignant neoplasm: Secondary | ICD-10-CM | POA: Diagnosis not present

## 2018-09-17 DIAGNOSIS — D751 Secondary polycythemia: Secondary | ICD-10-CM | POA: Diagnosis not present

## 2018-09-17 DIAGNOSIS — R7881 Bacteremia: Secondary | ICD-10-CM | POA: Diagnosis not present

## 2018-09-20 ENCOUNTER — Ambulatory Visit: Payer: Medicare Other | Admitting: Internal Medicine

## 2018-09-20 ENCOUNTER — Encounter: Payer: Self-pay | Admitting: Internal Medicine

## 2018-09-20 VITALS — BP 128/83 | HR 61 | Temp 98.1°F | Ht 69.0 in | Wt 206.8 lb

## 2018-09-20 DIAGNOSIS — E538 Deficiency of other specified B group vitamins: Secondary | ICD-10-CM | POA: Diagnosis not present

## 2018-09-20 DIAGNOSIS — R509 Fever, unspecified: Secondary | ICD-10-CM | POA: Diagnosis not present

## 2018-09-20 NOTE — Progress Notes (Signed)
RFV: follow up hospitalization with FUO  Patient ID: Michael Hines, male   DOB: [birth year], 73 y.o.   MRN: [id number]  HPI Michael Hines is a 73 y.o. male with hx of AVR who was admitted on 1/21 where he had Fever, AMS, flu like illness but had CoNS bacteremia where he underwent TEE which was negative for vegetation. He was quickly readmitted < 48hrs for ongoing fevers. 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 Since being discharged he has not had recurrent fevers.    Outpatient Encounter Medications as of 09/20/2018  Medication Sig  . aspirin EC 81 MG tablet Take 81 mg by mouth daily.  Marland Kitchen atorvastatin (LIPITOR) 20 MG tablet Take 20 mg by mouth at bedtime.  . budesonide-formoterol (SYMBICORT) 80-4.5 MCG/ACT inhaler Inhale 2 puffs into the lungs 2 (two) times daily.  . citalopram (CELEXA) 20 MG tablet Take 1 tablet (20 mg total) by mouth daily for 30 days.  . cyanocobalamin (,VITAMIN B-12,) 1000 MCG/ML injection Inject 1,000 mcg into the muscle See admin instructions. Instructions from pharmacy:Inject 52mL intramuscularly weekly for four weeks. Then 58mL intramuscularly monthly thereafter.  Pt states: Pt receives every 2 weeks as needed, but not sure what for. Pt also states he has never received weekly injections.  . gabapentin (NEURONTIN) 100 MG capsule Take 1 capsule (100 mg total) by mouth 3 (three) times daily.  Marland Kitchen ibuprofen (ADVIL,MOTRIN) 600 MG tablet Take 1 tablet (600 mg total) by mouth every 6 (six) hours as needed for moderate pain.  Marland Kitchen loperamide (IMODIUM) 2 MG capsule Take 1 capsule (2 mg total) by mouth as needed for diarrhea or loose stools.  Marland Kitchen LORazepam (ATIVAN) 0.5 MG tablet Take 1 tablet (0.5 mg total) by mouth at bedtime. (Patient not taking: Reported on 09/20/2018)   No facility-administered encounter medications on file as of 09/20/2018.      Patient Active Problem List   Diagnosis Date Noted  . Delirium 09/09/2018  .  Hypokalemia 09/09/2018  . Bacteremia   . Sepsis due to undetermined organism ([place]) 09/01/2018  . Diabetes mellitus without complication ([place]) 08/65/7846  . Hyponatremia 09/01/2018  . Polycythemia 09/01/2018  . Abnormal LFTs 09/01/2018  . Hypophosphatemia 09/01/2018  . Altered mental status 09/01/2018     Health Maintenance Due  Topic Date Due  . FOOT EXAM  02/25/1958  . OPHTHALMOLOGY EXAM  02/25/1958  . URINE MICROALBUMIN  02/25/1958  . TETANUS/TDAP  02/26/1967  . COLONOSCOPY  02/25/1998  . PNA vac Low Risk Adult (1 of 2 - PCV13) 02/25/2013  . INFLUENZA VACCINE  03/11/2018     Review of Systems 10 point ros is negative Physical Exam   BP 128/83   Pulse 61   Temp 98.1 F (36.7 C) (Oral)   Ht 5\' 9"  (1.753 m)   Wt 206 lb 12 oz (93.8 kg)   BMI 30.53 kg/m   Physical Exam  Constitutional: He is oriented to person, place, and time. He appears well-developed and well-nourished. No distress.  HENT:  Mouth/Throat: Oropharynx is clear and moist. No oropharyngeal exudate.  Cardiovascular: Normal rate, regular rhythm and normal heart sounds. Exam reveals no gallop and no friction rub.  No murmur heard.  Pulmonary/Chest: Effort normal and breath sounds normal. No respiratory distress. He has no wheezes.  Lymphadenopathy:  He has no cervical adenopathy.  Neurological: He is alert and oriented to person, place, and time.  Skin: Skin is warm and dry. No rash noted. No erythema.  Psychiatric: He has a normal mood and affect. His behavior is normal.    CBC Lab Results  Component Value Date   WBC 8.2 09/14/2018   RBC 4.49 09/14/2018   HGB 13.2 09/14/2018   HCT 41.3 09/14/2018   PLT 220 09/14/2018   MCV 92.0 09/14/2018   MCH 29.4 09/14/2018   MCHC 32.0 09/14/2018   RDW 14.8 09/14/2018   LYMPHSABS 0.8 09/09/2018   MONOABS 0.6 09/09/2018   EOSABS 0.1 09/09/2018    BMET Lab Results  Component Value Date   NA 140 09/14/2018   K 3.9 09/14/2018   CL 107 09/14/2018   CO2  24 09/14/2018   GLUCOSE 130 (H) 09/14/2018   BUN <5 (L) 09/14/2018   CREATININE 0.98 09/14/2018   CALCIUM 8.3 (L) 09/14/2018   GFRNONAA >60 09/14/2018   GFRAA >60 09/14/2018    Assessment and Plan  fuo = now resolved

## 2018-09-21 ENCOUNTER — Other Ambulatory Visit: Payer: Self-pay | Admitting: *Deleted

## 2018-09-21 NOTE — Patient Outreach (Signed)
[facility]) Care Management  09/21/2018  ARHUM PEEPLES 19-May-1946 [id number]    RN attempted the second outreach calls to both the home and mobile numbers however no answer and unable to leave a voice message. Will rescheduled another follow up call for pending [facility] services.   Raina Mina, RN Care Management Coordinator [place] Office [phone]

## 2018-09-24 ENCOUNTER — Other Ambulatory Visit: Payer: Self-pay | Admitting: *Deleted

## 2018-09-24 NOTE — Patient Outreach (Signed)
[facility]) Care Management  09/24/2018  TAYSHAUN KROH [birth year] [id number]    RN 3rd outreach attempt unsuccessful contacting all available contacts. RN able to leave a HIPAA approved voice message requesting a call back for pending services. Will allow pt time to return a call for pending services via [facility]. If no response with close this case from services in 10 days.  Raina Mina, RN Care Management Coordinator [place] Office [phone]

## 2018-09-30 ENCOUNTER — Other Ambulatory Visit: Payer: Self-pay | Admitting: *Deleted

## 2018-09-30 NOTE — Patient Outreach (Signed)
Michael Hines [facility]) Care Management  09/30/2018  Michael Hines 20-May-1946 [id number]    Several outreach call to this pt however all unsuccessful with no response to the outreach letter sent. Case will be closed at this time. And case closure letter sent to the pt's provider.  Raina Mina, RN Care Management Coordinator [place] Office [phone]

## 2018-10-08 DIAGNOSIS — R7309 Other abnormal glucose: Secondary | ICD-10-CM | POA: Diagnosis not present

## 2018-10-19 DIAGNOSIS — G5603 Carpal tunnel syndrome, bilateral upper limbs: Secondary | ICD-10-CM | POA: Diagnosis not present

## 2018-10-19 DIAGNOSIS — M5441 Lumbago with sciatica, right side: Secondary | ICD-10-CM | POA: Diagnosis not present

## 2018-10-19 DIAGNOSIS — M5442 Lumbago with sciatica, left side: Secondary | ICD-10-CM | POA: Diagnosis not present

## 2018-10-19 DIAGNOSIS — Z79899 Other long term (current) drug therapy: Secondary | ICD-10-CM | POA: Diagnosis not present

## 2018-10-26 DIAGNOSIS — E538 Deficiency of other specified B group vitamins: Secondary | ICD-10-CM | POA: Diagnosis not present

## 2018-11-29 DIAGNOSIS — J439 Emphysema, unspecified: Secondary | ICD-10-CM | POA: Diagnosis not present

## 2018-11-29 DIAGNOSIS — E1140 Type 2 diabetes mellitus with diabetic neuropathy, unspecified: Secondary | ICD-10-CM | POA: Diagnosis not present

## 2018-11-29 DIAGNOSIS — E7800 Pure hypercholesterolemia, unspecified: Secondary | ICD-10-CM | POA: Diagnosis not present

## 2018-12-23 DIAGNOSIS — G603 Idiopathic progressive neuropathy: Secondary | ICD-10-CM | POA: Diagnosis not present

## 2018-12-23 DIAGNOSIS — Z03818 Encounter for observation for suspected exposure to other biological agents ruled out: Secondary | ICD-10-CM | POA: Diagnosis not present

## 2018-12-23 DIAGNOSIS — G5603 Carpal tunnel syndrome, bilateral upper limbs: Secondary | ICD-10-CM | POA: Diagnosis not present

## 2018-12-23 DIAGNOSIS — Z79899 Other long term (current) drug therapy: Secondary | ICD-10-CM | POA: Diagnosis not present

## 2018-12-23 DIAGNOSIS — M545 Low back pain: Secondary | ICD-10-CM | POA: Diagnosis not present

## 2018-12-27 DIAGNOSIS — Z23 Encounter for immunization: Secondary | ICD-10-CM | POA: Diagnosis not present

## 2018-12-27 DIAGNOSIS — E538 Deficiency of other specified B group vitamins: Secondary | ICD-10-CM | POA: Diagnosis not present

## 2019-01-27 DIAGNOSIS — I359 Nonrheumatic aortic valve disorder, unspecified: Secondary | ICD-10-CM | POA: Diagnosis not present

## 2019-01-27 DIAGNOSIS — Z952 Presence of prosthetic heart valve: Secondary | ICD-10-CM | POA: Diagnosis not present

## 2019-02-01 DIAGNOSIS — E538 Deficiency of other specified B group vitamins: Secondary | ICD-10-CM | POA: Diagnosis not present

## 2019-02-17 DIAGNOSIS — M5441 Lumbago with sciatica, right side: Secondary | ICD-10-CM | POA: Diagnosis not present

## 2019-02-17 DIAGNOSIS — G603 Idiopathic progressive neuropathy: Secondary | ICD-10-CM | POA: Diagnosis not present

## 2019-02-17 DIAGNOSIS — Z79899 Other long term (current) drug therapy: Secondary | ICD-10-CM | POA: Diagnosis not present

## 2019-02-17 DIAGNOSIS — G5603 Carpal tunnel syndrome, bilateral upper limbs: Secondary | ICD-10-CM | POA: Diagnosis not present

## 2019-02-21 DIAGNOSIS — H2513 Age-related nuclear cataract, bilateral: Secondary | ICD-10-CM | POA: Diagnosis not present

## 2019-02-21 DIAGNOSIS — Z7984 Long term (current) use of oral hypoglycemic drugs: Secondary | ICD-10-CM | POA: Diagnosis not present

## 2019-02-21 DIAGNOSIS — E119 Type 2 diabetes mellitus without complications: Secondary | ICD-10-CM | POA: Diagnosis not present

## 2019-02-21 DIAGNOSIS — H53001 Unspecified amblyopia, right eye: Secondary | ICD-10-CM | POA: Diagnosis not present

## 2019-02-22 DIAGNOSIS — E538 Deficiency of other specified B group vitamins: Secondary | ICD-10-CM | POA: Diagnosis not present

## 2019-03-07 DIAGNOSIS — G4730 Sleep apnea, unspecified: Secondary | ICD-10-CM | POA: Diagnosis not present

## 2019-03-23 DIAGNOSIS — Z0000 Encounter for general adult medical examination without abnormal findings: Secondary | ICD-10-CM | POA: Diagnosis not present

## 2019-03-23 DIAGNOSIS — E538 Deficiency of other specified B group vitamins: Secondary | ICD-10-CM | POA: Diagnosis not present

## 2019-03-23 DIAGNOSIS — E1140 Type 2 diabetes mellitus with diabetic neuropathy, unspecified: Secondary | ICD-10-CM | POA: Diagnosis not present

## 2019-03-23 DIAGNOSIS — E7800 Pure hypercholesterolemia, unspecified: Secondary | ICD-10-CM | POA: Diagnosis not present

## 2019-03-24 DIAGNOSIS — G5623 Lesion of ulnar nerve, bilateral upper limbs: Secondary | ICD-10-CM | POA: Diagnosis not present

## 2019-03-24 DIAGNOSIS — M5417 Radiculopathy, lumbosacral region: Secondary | ICD-10-CM | POA: Diagnosis not present

## 2019-03-24 DIAGNOSIS — E538 Deficiency of other specified B group vitamins: Secondary | ICD-10-CM | POA: Diagnosis not present

## 2019-03-24 DIAGNOSIS — R270 Ataxia, unspecified: Secondary | ICD-10-CM | POA: Diagnosis not present

## 2019-03-24 DIAGNOSIS — G5603 Carpal tunnel syndrome, bilateral upper limbs: Secondary | ICD-10-CM | POA: Diagnosis not present

## 2019-03-24 DIAGNOSIS — G603 Idiopathic progressive neuropathy: Secondary | ICD-10-CM | POA: Diagnosis not present

## 2019-04-21 DIAGNOSIS — M545 Low back pain: Secondary | ICD-10-CM | POA: Diagnosis not present

## 2019-04-21 DIAGNOSIS — G250 Essential tremor: Secondary | ICD-10-CM | POA: Diagnosis not present

## 2019-04-21 DIAGNOSIS — G603 Idiopathic progressive neuropathy: Secondary | ICD-10-CM | POA: Diagnosis not present

## 2019-04-21 DIAGNOSIS — E538 Deficiency of other specified B group vitamins: Secondary | ICD-10-CM | POA: Diagnosis not present

## 2019-04-21 DIAGNOSIS — Z79899 Other long term (current) drug therapy: Secondary | ICD-10-CM | POA: Diagnosis not present

## 2019-05-20 DIAGNOSIS — Z23 Encounter for immunization: Secondary | ICD-10-CM | POA: Diagnosis not present

## 2019-06-17 DIAGNOSIS — E538 Deficiency of other specified B group vitamins: Secondary | ICD-10-CM | POA: Diagnosis not present

## 2019-06-23 DIAGNOSIS — G250 Essential tremor: Secondary | ICD-10-CM | POA: Diagnosis not present

## 2019-06-23 DIAGNOSIS — Z79899 Other long term (current) drug therapy: Secondary | ICD-10-CM | POA: Diagnosis not present

## 2019-06-23 DIAGNOSIS — G4733 Obstructive sleep apnea (adult) (pediatric): Secondary | ICD-10-CM | POA: Diagnosis not present

## 2019-06-23 DIAGNOSIS — G603 Idiopathic progressive neuropathy: Secondary | ICD-10-CM | POA: Diagnosis not present

## 2019-06-23 DIAGNOSIS — M545 Low back pain: Secondary | ICD-10-CM | POA: Diagnosis not present

## 2019-07-14 DIAGNOSIS — E538 Deficiency of other specified B group vitamins: Secondary | ICD-10-CM | POA: Diagnosis not present

## 2019-08-03 DIAGNOSIS — E538 Deficiency of other specified B group vitamins: Secondary | ICD-10-CM | POA: Diagnosis not present

## 2019-08-03 DIAGNOSIS — E1140 Type 2 diabetes mellitus with diabetic neuropathy, unspecified: Secondary | ICD-10-CM | POA: Diagnosis not present

## 2019-08-03 DIAGNOSIS — E7800 Pure hypercholesterolemia, unspecified: Secondary | ICD-10-CM | POA: Diagnosis not present

## 2019-08-25 DIAGNOSIS — E538 Deficiency of other specified B group vitamins: Secondary | ICD-10-CM | POA: Diagnosis not present

## 2019-08-31 ENCOUNTER — Emergency Department (HOSPITAL_COMMUNITY)
Admission: EM | Admit: 2019-08-31 | Discharge: 2019-08-31 | Disposition: A | Discharge status: Home / Self Care | Payer: Medicare Other | Attending: Emergency Medicine | Admitting: Emergency Medicine

## 2019-08-31 ENCOUNTER — Emergency Department (HOSPITAL_COMMUNITY): Payer: Medicare Other

## 2019-08-31 ENCOUNTER — Other Ambulatory Visit: Payer: Self-pay

## 2019-08-31 ENCOUNTER — Encounter (HOSPITAL_COMMUNITY): Payer: Self-pay

## 2019-08-31 DIAGNOSIS — R0789 Other chest pain: Secondary | ICD-10-CM | POA: Diagnosis not present

## 2019-08-31 DIAGNOSIS — R1011 Right upper quadrant pain: Secondary | ICD-10-CM | POA: Diagnosis not present

## 2019-08-31 DIAGNOSIS — Y92410 Unspecified street and highway as the place of occurrence of the external cause: Secondary | ICD-10-CM | POA: Insufficient documentation

## 2019-08-31 DIAGNOSIS — R404 Transient alteration of awareness: Secondary | ICD-10-CM | POA: Diagnosis not present

## 2019-08-31 DIAGNOSIS — Z79899 Other long term (current) drug therapy: Secondary | ICD-10-CM | POA: Diagnosis not present

## 2019-08-31 DIAGNOSIS — Y9389 Activity, other specified: Secondary | ICD-10-CM | POA: Diagnosis not present

## 2019-08-31 DIAGNOSIS — M5030 Other cervical disc degeneration, unspecified cervical region: Secondary | ICD-10-CM | POA: Diagnosis not present

## 2019-08-31 DIAGNOSIS — Z87891 Personal history of nicotine dependence: Secondary | ICD-10-CM | POA: Insufficient documentation

## 2019-08-31 DIAGNOSIS — E119 Type 2 diabetes mellitus without complications: Secondary | ICD-10-CM | POA: Diagnosis not present

## 2019-08-31 DIAGNOSIS — Z743 Need for continuous supervision: Secondary | ICD-10-CM | POA: Diagnosis not present

## 2019-08-31 DIAGNOSIS — Y998 Other external cause status: Secondary | ICD-10-CM | POA: Diagnosis not present

## 2019-08-31 DIAGNOSIS — Z952 Presence of prosthetic heart valve: Secondary | ICD-10-CM | POA: Diagnosis not present

## 2019-08-31 DIAGNOSIS — R52 Pain, unspecified: Secondary | ICD-10-CM | POA: Diagnosis not present

## 2019-08-31 DIAGNOSIS — R609 Edema, unspecified: Secondary | ICD-10-CM | POA: Diagnosis not present

## 2019-08-31 DIAGNOSIS — S199XXA Unspecified injury of neck, initial encounter: Secondary | ICD-10-CM | POA: Diagnosis present

## 2019-08-31 DIAGNOSIS — Z7982 Long term (current) use of aspirin: Secondary | ICD-10-CM | POA: Insufficient documentation

## 2019-08-31 DIAGNOSIS — T148XXA Other injury of unspecified body region, initial encounter: Secondary | ICD-10-CM

## 2019-08-31 DIAGNOSIS — S1093XA Contusion of unspecified part of neck, initial encounter: Secondary | ICD-10-CM | POA: Diagnosis not present

## 2019-08-31 DIAGNOSIS — N200 Calculus of kidney: Secondary | ICD-10-CM | POA: Diagnosis not present

## 2019-08-31 DIAGNOSIS — R410 Disorientation, unspecified: Secondary | ICD-10-CM | POA: Diagnosis not present

## 2019-08-31 DIAGNOSIS — I714 Abdominal aortic aneurysm, without rupture: Secondary | ICD-10-CM | POA: Diagnosis not present

## 2019-08-31 DIAGNOSIS — K5790 Diverticulosis of intestine, part unspecified, without perforation or abscess without bleeding: Secondary | ICD-10-CM | POA: Diagnosis not present

## 2019-08-31 DIAGNOSIS — I6782 Cerebral ischemia: Secondary | ICD-10-CM | POA: Diagnosis not present

## 2019-08-31 LAB — CBC WITH DIFFERENTIAL/PLATELET
Abs Immature Granulocytes: 0.09 10*3/uL — ABNORMAL HIGH (ref 0.00–0.07)
Basophils Absolute: 0.1 10*3/uL (ref 0.0–0.1)
Basophils Relative: 1 %
Eosinophils Absolute: 0.2 10*3/uL (ref 0.0–0.5)
Eosinophils Relative: 3 %
HCT: 45.3 % (ref 39.0–52.0)
Hemoglobin: 14.5 g/dL (ref 13.0–17.0)
Immature Granulocytes: 1 %
Lymphocytes Relative: 21 %
Lymphs Abs: 1.6 10*3/uL (ref 0.7–4.0)
MCH: 30.7 pg (ref 26.0–34.0)
MCHC: 32 g/dL (ref 30.0–36.0)
MCV: 96 fL (ref 80.0–100.0)
Monocytes Absolute: 0.6 10*3/uL (ref 0.1–1.0)
Monocytes Relative: 8 %
Neutro Abs: 5 10*3/uL (ref 1.7–7.7)
Neutrophils Relative %: 66 %
Platelets: 178 10*3/uL (ref 150–400)
RBC: 4.72 MIL/uL (ref 4.22–5.81)
RDW: 13.9 % (ref 11.5–15.5)
WBC: 7.6 10*3/uL (ref 4.0–10.5)
nRBC: 0 % (ref 0.0–0.2)

## 2019-08-31 LAB — COMPREHENSIVE METABOLIC PANEL
ALT: 20 U/L (ref 0–44)
AST: 26 U/L (ref 15–41)
Albumin: 2.8 g/dL — ABNORMAL LOW (ref 3.5–5.0)
Alkaline Phosphatase: 50 U/L (ref 38–126)
Anion gap: 6 (ref 5–15)
BUN: 8 mg/dL (ref 8–23)
CO2: 24 mmol/L (ref 22–32)
Calcium: 7 mg/dL — ABNORMAL LOW (ref 8.9–10.3)
Chloride: 111 mmol/L (ref 98–111)
Creatinine, Ser: 1.03 mg/dL (ref 0.61–1.24)
GFR calc Af Amer: >60 mL/min (ref >60)
GFR calc non Af Amer: >60 mL/min (ref >60)
Glucose, Bld: 98 mg/dL (ref 70–99)
Potassium: 3.3 mmol/L — ABNORMAL LOW (ref 3.5–5.1)
Sodium: 141 mmol/L (ref 135–145)
Total Bilirubin: 0.4 mg/dL (ref 0.3–1.2)
Total Protein: 4.9 g/dL — ABNORMAL LOW (ref 6.5–8.1)
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
## 2019-08-31 MED ORDER — TETANUS-DIPHTH-ACELL PERTUSSIS 5-2.5-18.5 LF-MCG/0.5 IM SUSP
0.5000 mL | Freq: Once | INTRAMUSCULAR | Status: AC
  Administered 2019-08-31: 0.5 mL via INTRAMUSCULAR
  Filled 2019-08-31: qty 0.5

## 2019-08-31 MED ORDER — FENTANYL CITRATE (PF) 100 MCG/2ML IJ SOLN
50.0000 ug | Freq: Once | INTRAMUSCULAR | Status: AC
  Administered 2019-08-31: 50 ug via INTRAVENOUS
  Filled 2019-08-31: qty 2

## 2019-08-31 MED ORDER — HYDROMORPHONE HCL 1 MG/ML IJ SOLN
0.5000 mg | Freq: Once | INTRAMUSCULAR | Status: AC
  Administered 2019-08-31: 0.5 mg via INTRAVENOUS
  Filled 2019-08-31: qty 1

## 2019-08-31 MED ORDER — SODIUM CHLORIDE 0.9 % IV BOLUS
1000.0000 mL | Freq: Once | INTRAVENOUS | Status: AC
  Administered 2019-08-31: 1000 mL via INTRAVENOUS

## 2019-08-31 MED ORDER — ALBUTEROL SULFATE HFA 108 (90 BASE) MCG/ACT IN AERS
2.0000 | INHALATION_SPRAY | Freq: Once | RESPIRATORY_TRACT | Status: AC
  Administered 2019-08-31: 2 via RESPIRATORY_TRACT
  Filled 2019-08-31: qty 6.7

## 2019-08-31 MED ORDER — IOHEXOL 350 MG/ML SOLN
50.0000 mL | Freq: Once | INTRAVENOUS | Status: AC | PRN
  Administered 2019-08-31: 50 mL via INTRAVENOUS

## 2019-08-31 MED ORDER — IOHEXOL 300 MG/ML  SOLN
100.0000 mL | Freq: Once | INTRAMUSCULAR | Status: AC | PRN
  Administered 2019-08-31: 100 mL via INTRAVENOUS
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
## 2019-08-31 NOTE — Discharge Instructions (Addendum)
You may take your regularly scheduled pain medications at home.with your symptoms.  You will likely be very sore tomorrow.  Please make an appointment to follow-up with your regular doctor in the next week for reassessment of your symptoms.  You should also call the cardiothoracic surgery office to make an appointment for follow-up in regards to the thoracic aortic aneurysm that was found on your CT scan today.  Please return to the emergency department for any new or worsening symptoms in the meantime.
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
## 2019-09-06 DIAGNOSIS — T148XXA Other injury of unspecified body region, initial encounter: Secondary | ICD-10-CM | POA: Diagnosis not present

## 2019-09-06 DIAGNOSIS — I712 Thoracic aortic aneurysm, without rupture: Secondary | ICD-10-CM | POA: Diagnosis not present

## 2019-09-06 DIAGNOSIS — M62830 Muscle spasm of back: Secondary | ICD-10-CM | POA: Diagnosis not present

## 2019-09-06 DIAGNOSIS — T07XXXA Unspecified multiple injuries, initial encounter: Secondary | ICD-10-CM | POA: Diagnosis not present

## 2019-09-13 DIAGNOSIS — G603 Idiopathic progressive neuropathy: Secondary | ICD-10-CM | POA: Diagnosis not present

## 2019-09-13 DIAGNOSIS — G5603 Carpal tunnel syndrome, bilateral upper limbs: Secondary | ICD-10-CM | POA: Diagnosis not present

## 2019-09-13 DIAGNOSIS — M545 Low back pain: Secondary | ICD-10-CM | POA: Diagnosis not present

## 2019-09-13 DIAGNOSIS — Z79899 Other long term (current) drug therapy: Secondary | ICD-10-CM | POA: Diagnosis not present

## 2019-09-13 DIAGNOSIS — G250 Essential tremor: Secondary | ICD-10-CM | POA: Diagnosis not present

## 2019-09-14 ENCOUNTER — Institutional Professional Consult (permissible substitution): Payer: Medicare Other | Admitting: Cardiothoracic Surgery

## 2019-09-14 ENCOUNTER — Other Ambulatory Visit: Payer: Self-pay

## 2019-09-14 ENCOUNTER — Encounter: Payer: Self-pay | Admitting: Cardiothoracic Surgery

## 2019-09-14 DIAGNOSIS — I712 Thoracic aortic aneurysm, without rupture, unspecified: Secondary | ICD-10-CM | POA: Insufficient documentation
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
## 2019-10-04 DIAGNOSIS — E538 Deficiency of other specified B group vitamins: Secondary | ICD-10-CM | POA: Diagnosis not present

## 2019-11-04 DIAGNOSIS — E538 Deficiency of other specified B group vitamins: Secondary | ICD-10-CM | POA: Diagnosis not present

## 2019-11-15 DIAGNOSIS — M545 Low back pain: Secondary | ICD-10-CM | POA: Diagnosis not present

## 2019-11-15 DIAGNOSIS — Z79899 Other long term (current) drug therapy: Secondary | ICD-10-CM | POA: Diagnosis not present

## 2019-11-15 DIAGNOSIS — G4733 Obstructive sleep apnea (adult) (pediatric): Secondary | ICD-10-CM | POA: Diagnosis not present

## 2019-11-15 DIAGNOSIS — G250 Essential tremor: Secondary | ICD-10-CM | POA: Diagnosis not present

## 2019-11-25 ENCOUNTER — Emergency Department (HOSPITAL_BASED_OUTPATIENT_CLINIC_OR_DEPARTMENT_OTHER): Payer: Medicare Other

## 2019-11-25 ENCOUNTER — Other Ambulatory Visit: Payer: Self-pay

## 2019-11-25 ENCOUNTER — Encounter (HOSPITAL_BASED_OUTPATIENT_CLINIC_OR_DEPARTMENT_OTHER): Payer: Self-pay | Admitting: *Deleted

## 2019-11-25 DIAGNOSIS — Y9389 Activity, other specified: Secondary | ICD-10-CM | POA: Insufficient documentation

## 2019-11-25 DIAGNOSIS — Y999 Unspecified external cause status: Secondary | ICD-10-CM | POA: Diagnosis not present

## 2019-11-25 DIAGNOSIS — W108XXA Fall (on) (from) other stairs and steps, initial encounter: Secondary | ICD-10-CM | POA: Diagnosis not present

## 2019-11-25 DIAGNOSIS — Z87891 Personal history of nicotine dependence: Secondary | ICD-10-CM | POA: Diagnosis not present

## 2019-11-25 DIAGNOSIS — Z7982 Long term (current) use of aspirin: Secondary | ICD-10-CM | POA: Diagnosis not present

## 2019-11-25 DIAGNOSIS — Z7984 Long term (current) use of oral hypoglycemic drugs: Secondary | ICD-10-CM | POA: Diagnosis not present

## 2019-11-25 DIAGNOSIS — E119 Type 2 diabetes mellitus without complications: Secondary | ICD-10-CM | POA: Insufficient documentation

## 2019-11-25 DIAGNOSIS — S20212A Contusion of left front wall of thorax, initial encounter: Secondary | ICD-10-CM | POA: Diagnosis not present

## 2019-11-25 DIAGNOSIS — Y9289 Other specified places as the place of occurrence of the external cause: Secondary | ICD-10-CM | POA: Insufficient documentation

## 2019-11-25 DIAGNOSIS — R030 Elevated blood-pressure reading, without diagnosis of hypertension: Secondary | ICD-10-CM | POA: Insufficient documentation

## 2019-11-25 DIAGNOSIS — Z79899 Other long term (current) drug therapy: Secondary | ICD-10-CM | POA: Insufficient documentation

## 2019-11-25 DIAGNOSIS — S299XXA Unspecified injury of thorax, initial encounter: Secondary | ICD-10-CM | POA: Diagnosis not present

## 2019-11-25 IMAGING — CR DG RIBS W/ CHEST 3+V*L*
3 series · 3 of 3 positions shown · non-contrast
Comparison: CT [DATE]

CLINICAL DATA: Fall 2 days prior, rib pain

EXAM:
LEFT RIBS AND CHEST - 3+ VIEW

[w chest pa]
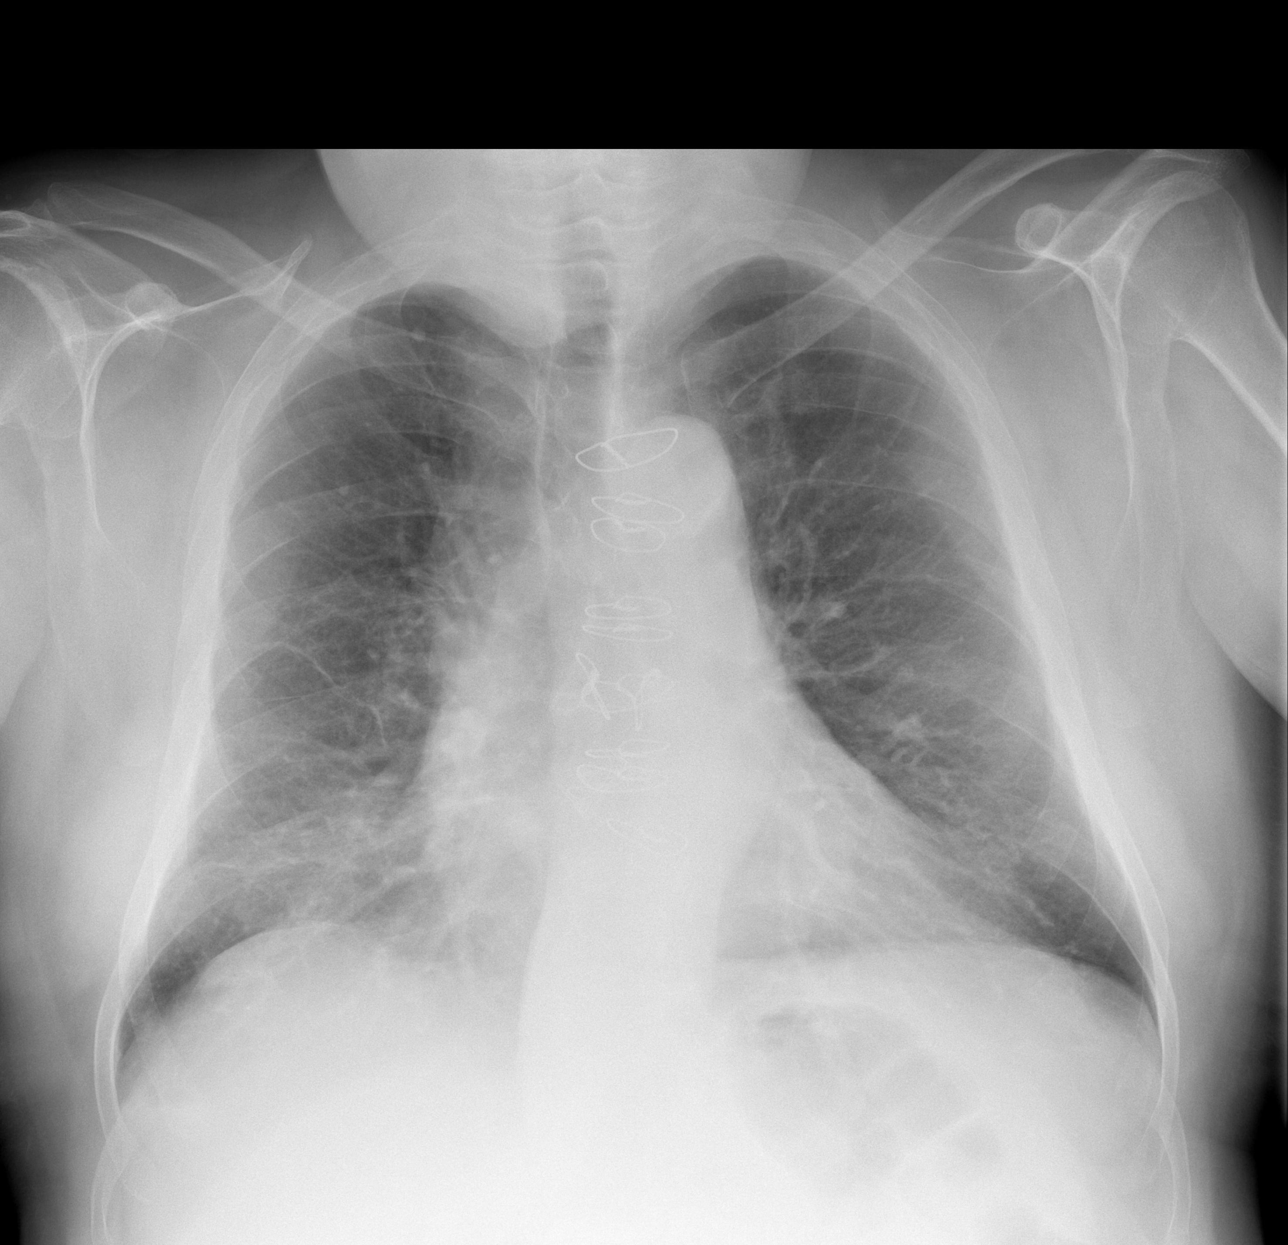

[w ribs ap/pa upper left]
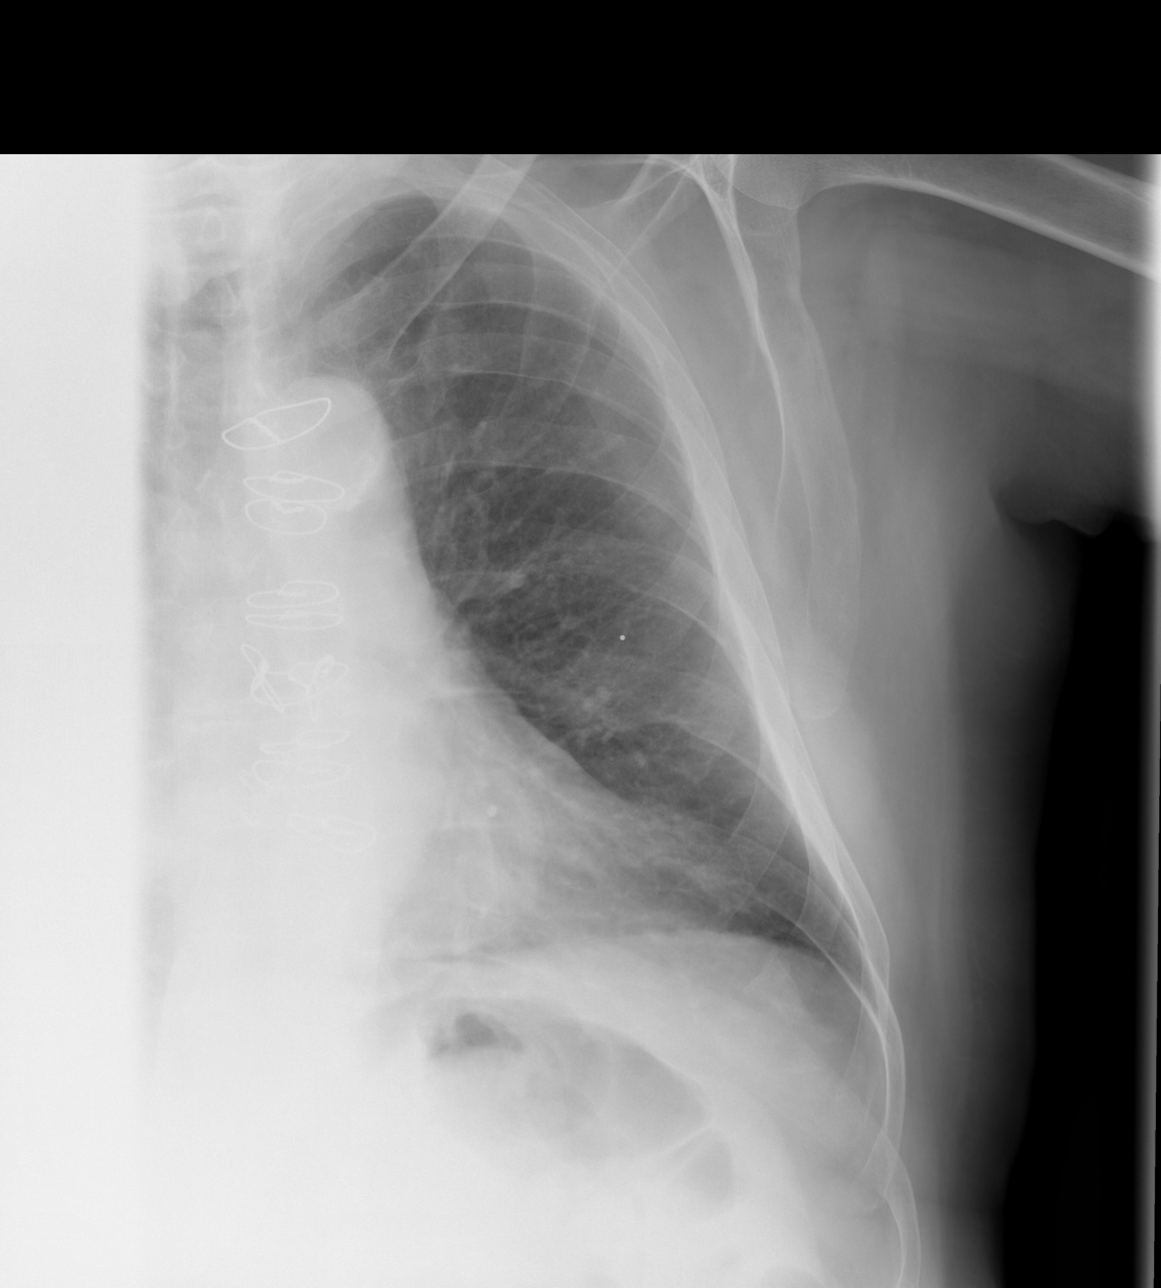

[w ribs oblique left]
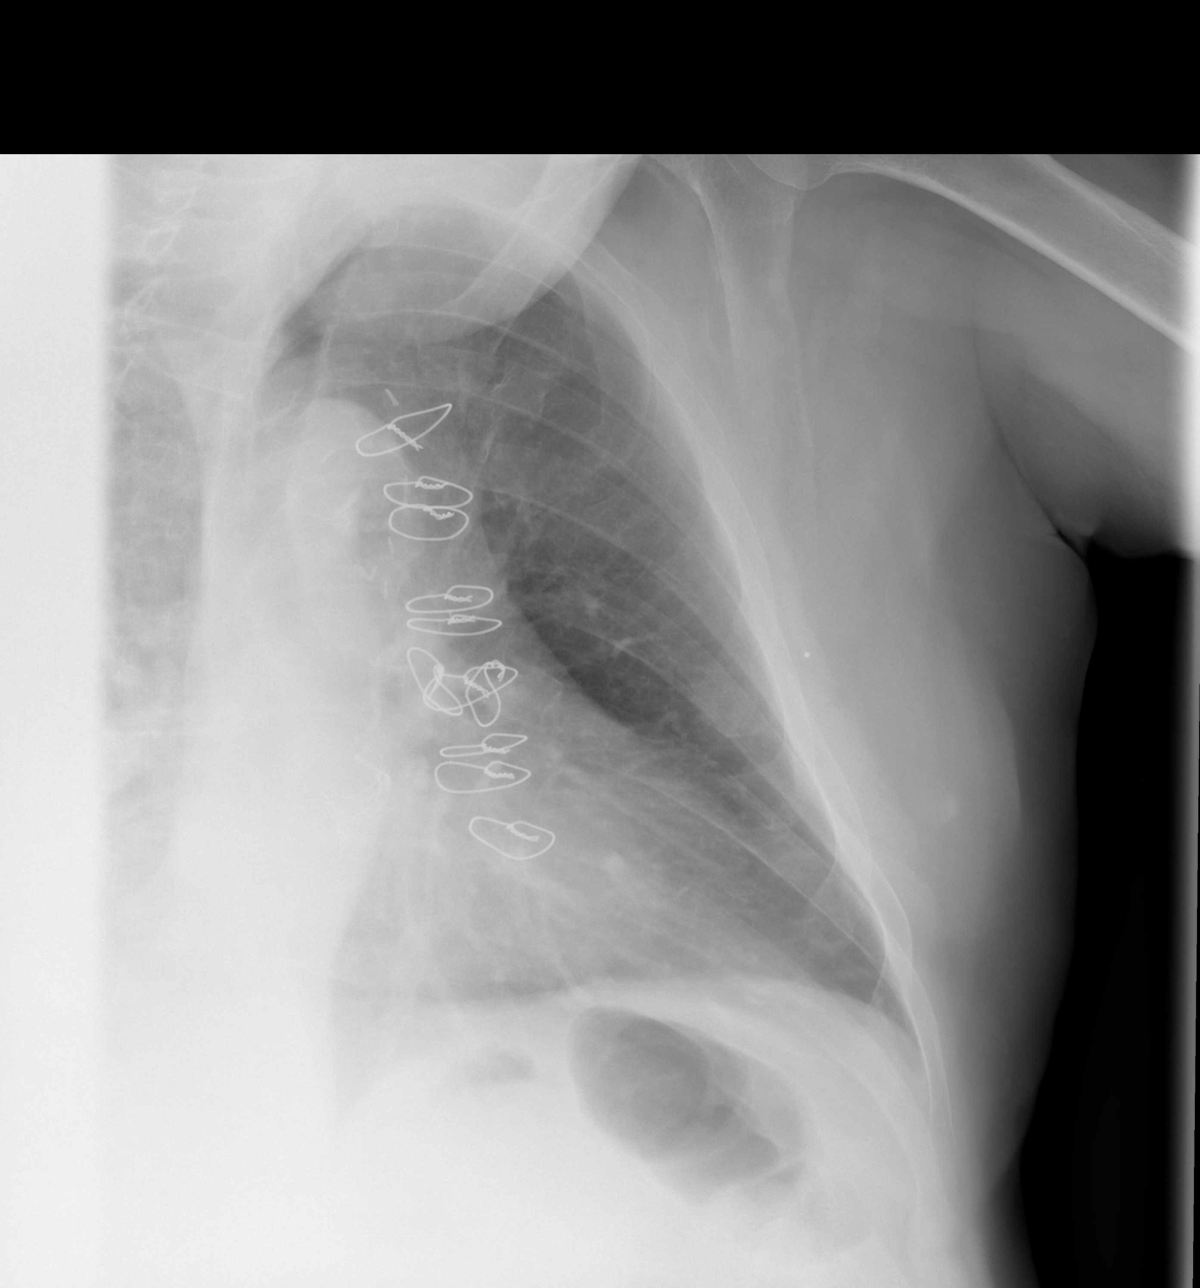

[3 of 3 positions shown; findings below may reference images not displayed]

FINDINGS: There are postsurgical changes from prior sternotomy a bioprosthetic
aortic valve replacement. Stable mild cardiomegaly is similar to
comparison. The aorta is calcified. The remaining cardiomediastinal
contours are unremarkable. There is some patchy consolidation in the
right infrahilar lung with more streaky opacities which may reflect
atelectatic change in the setting chest wall pain and potential
splinting though underlying infection is not excluded. No visible
displaced rib fracture is evident. No other acute osseous or soft
tissue abnormality is seen.
IMPRESSION: 1. Patchy opacity in the right infrahilar lung with more streaky
opacities which may reflect atelectatic change in the setting chest
wall pain and potential splinting though underlying infection is not
excluded.
2. No visible displaced rib fracture, pneumothorax or effusion.
3. Prior sternotomy and bioprosthetic aortic valve replacement.

## 2019-11-25 NOTE — ED Triage Notes (Signed)
2 days ago he carried heavy luggage up 3 stories of steps and he fell onto the luggage. Injury to his left ribs. He is ambulatory. He took a Vicodin an hour ago with some relief.

## 2019-11-26 ENCOUNTER — Emergency Department (HOSPITAL_BASED_OUTPATIENT_CLINIC_OR_DEPARTMENT_OTHER)
Admission: EM | Admit: 2019-11-26 | Discharge: 2019-11-26 | Disposition: A | Discharge status: Home / Self Care | Payer: Medicare Other | Attending: Emergency Medicine | Admitting: Emergency Medicine

## 2019-11-26 DIAGNOSIS — R030 Elevated blood-pressure reading, without diagnosis of hypertension: Secondary | ICD-10-CM

## 2019-11-26 DIAGNOSIS — W010XXA Fall on same level from slipping, tripping and stumbling without subsequent striking against object, initial encounter: Secondary | ICD-10-CM

## 2019-11-26 DIAGNOSIS — S20212A Contusion of left front wall of thorax, initial encounter: Secondary | ICD-10-CM
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
## 2019-11-26 NOTE — Discharge Instructions (Addendum)
Apply ice for 30 minutes at a time, 4 times a day.  Continue taking hydrocodone-acetaminophen and ibuprofen as needed.  Your blood pressure was slightly elevated today.  Please have it checked at home.  If it stays elevated, you may need to be on medication to control it.

## 2019-12-01 DIAGNOSIS — J439 Emphysema, unspecified: Secondary | ICD-10-CM | POA: Diagnosis not present

## 2019-12-01 DIAGNOSIS — E1169 Type 2 diabetes mellitus with other specified complication: Secondary | ICD-10-CM | POA: Diagnosis not present

## 2019-12-01 DIAGNOSIS — E7800 Pure hypercholesterolemia, unspecified: Secondary | ICD-10-CM | POA: Diagnosis not present

## 2019-12-01 DIAGNOSIS — E538 Deficiency of other specified B group vitamins: Secondary | ICD-10-CM | POA: Diagnosis not present

## 2019-12-01 DIAGNOSIS — E349 Endocrine disorder, unspecified: Secondary | ICD-10-CM | POA: Diagnosis not present

## 2019-12-01 DIAGNOSIS — E1140 Type 2 diabetes mellitus with diabetic neuropathy, unspecified: Secondary | ICD-10-CM | POA: Diagnosis not present

## 2019-12-04 ENCOUNTER — Encounter (HOSPITAL_COMMUNITY): Payer: Self-pay | Admitting: Emergency Medicine

## 2019-12-04 ENCOUNTER — Emergency Department (HOSPITAL_COMMUNITY): Payer: Medicare Other

## 2019-12-04 ENCOUNTER — Other Ambulatory Visit: Payer: Self-pay

## 2019-12-04 ENCOUNTER — Emergency Department (HOSPITAL_COMMUNITY)
Admission: EM | Admit: 2019-12-04 | Discharge: 2019-12-05 | Disposition: A | Discharge status: Home / Self Care | Payer: Medicare Other | Attending: Emergency Medicine | Admitting: Emergency Medicine

## 2019-12-04 DIAGNOSIS — R531 Weakness: Secondary | ICD-10-CM | POA: Insufficient documentation

## 2019-12-04 DIAGNOSIS — S3992XA Unspecified injury of lower back, initial encounter: Secondary | ICD-10-CM | POA: Diagnosis not present

## 2019-12-04 DIAGNOSIS — R112 Nausea with vomiting, unspecified: Secondary | ICD-10-CM | POA: Diagnosis not present

## 2019-12-04 DIAGNOSIS — Z9049 Acquired absence of other specified parts of digestive tract: Secondary | ICD-10-CM | POA: Insufficient documentation

## 2019-12-04 DIAGNOSIS — R0602 Shortness of breath: Secondary | ICD-10-CM | POA: Diagnosis not present

## 2019-12-04 DIAGNOSIS — Z743 Need for continuous supervision: Secondary | ICD-10-CM | POA: Diagnosis not present

## 2019-12-04 DIAGNOSIS — J449 Chronic obstructive pulmonary disease, unspecified: Secondary | ICD-10-CM | POA: Insufficient documentation

## 2019-12-04 DIAGNOSIS — I712 Thoracic aortic aneurysm, without rupture, unspecified: Secondary | ICD-10-CM

## 2019-12-04 DIAGNOSIS — M549 Dorsalgia, unspecified: Secondary | ICD-10-CM

## 2019-12-04 DIAGNOSIS — Z79899 Other long term (current) drug therapy: Secondary | ICD-10-CM | POA: Diagnosis not present

## 2019-12-04 DIAGNOSIS — Z952 Presence of prosthetic heart valve: Secondary | ICD-10-CM | POA: Diagnosis not present

## 2019-12-04 DIAGNOSIS — Z7982 Long term (current) use of aspirin: Secondary | ICD-10-CM | POA: Insufficient documentation

## 2019-12-04 DIAGNOSIS — M47812 Spondylosis without myelopathy or radiculopathy, cervical region: Secondary | ICD-10-CM | POA: Diagnosis not present

## 2019-12-04 DIAGNOSIS — I10 Essential (primary) hypertension: Secondary | ICD-10-CM | POA: Diagnosis not present

## 2019-12-04 DIAGNOSIS — R1110 Vomiting, unspecified: Secondary | ICD-10-CM

## 2019-12-04 DIAGNOSIS — R197 Diarrhea, unspecified: Secondary | ICD-10-CM | POA: Diagnosis not present

## 2019-12-04 DIAGNOSIS — E119 Type 2 diabetes mellitus without complications: Secondary | ICD-10-CM | POA: Insufficient documentation

## 2019-12-04 DIAGNOSIS — Z87891 Personal history of nicotine dependence: Secondary | ICD-10-CM | POA: Insufficient documentation

## 2019-12-04 DIAGNOSIS — R000 Tachycardia, unspecified: Secondary | ICD-10-CM | POA: Diagnosis not present

## 2019-12-04 DIAGNOSIS — Z7984 Long term (current) use of oral hypoglycemic drugs: Secondary | ICD-10-CM | POA: Insufficient documentation

## 2019-12-04 DIAGNOSIS — M50323 Other cervical disc degeneration at C6-C7 level: Secondary | ICD-10-CM | POA: Diagnosis not present

## 2019-12-04 DIAGNOSIS — R05 Cough: Secondary | ICD-10-CM | POA: Diagnosis not present

## 2019-12-04 DIAGNOSIS — R410 Disorientation, unspecified: Secondary | ICD-10-CM | POA: Diagnosis not present

## 2019-12-04 LAB — CBC WITH DIFFERENTIAL/PLATELET
Abs Immature Granulocytes: 0.12 10*3/uL — ABNORMAL HIGH (ref 0.00–0.07)
Basophils Absolute: 0.1 10*3/uL (ref 0.0–0.1)
Basophils Relative: 0 %
Eosinophils Absolute: 0 10*3/uL (ref 0.0–0.5)
Eosinophils Relative: 0 %
HCT: 55.5 % — ABNORMAL HIGH (ref 39.0–52.0)
Hemoglobin: 18.2 g/dL — ABNORMAL HIGH (ref 13.0–17.0)
Immature Granulocytes: 1 %
Lymphocytes Relative: 7 %
Lymphs Abs: 0.8 10*3/uL (ref 0.7–4.0)
MCH: 30.6 pg (ref 26.0–34.0)
MCHC: 32.8 g/dL (ref 30.0–36.0)
MCV: 93.4 fL (ref 80.0–100.0)
Monocytes Absolute: 0.9 10*3/uL (ref 0.1–1.0)
Monocytes Relative: 7 %
Neutro Abs: 10.1 10*3/uL — ABNORMAL HIGH (ref 1.7–7.7)
Neutrophils Relative %: 85 %
Platelets: 135 10*3/uL — ABNORMAL LOW (ref 150–400)
RBC: 5.94 MIL/uL — ABNORMAL HIGH (ref 4.22–5.81)
RDW: 15.5 % (ref 11.5–15.5)
WBC: 11.9 10*3/uL — ABNORMAL HIGH (ref 4.0–10.5)
nRBC: 0 % (ref 0.0–0.2)

## 2019-12-04 LAB — COMPREHENSIVE METABOLIC PANEL
ALT: 31 U/L (ref 0–44)
AST: 26 U/L (ref 15–41)
Albumin: 3.5 g/dL (ref 3.5–5.0)
Alkaline Phosphatase: 79 U/L (ref 38–126)
Anion gap: 11 (ref 5–15)
BUN: 15 mg/dL (ref 8–23)
CO2: 22 mmol/L (ref 22–32)
Calcium: 8.4 mg/dL — ABNORMAL LOW (ref 8.9–10.3)
Chloride: 101 mmol/L (ref 98–111)
Creatinine, Ser: 1.08 mg/dL (ref 0.61–1.24)
GFR calc Af Amer: >60 mL/min (ref >60)
GFR calc non Af Amer: >60 mL/min (ref >60)
Glucose, Bld: 113 mg/dL — ABNORMAL HIGH (ref 70–99)
Potassium: 4 mmol/L (ref 3.5–5.1)
Sodium: 134 mmol/L — ABNORMAL LOW (ref 135–145)
Total Bilirubin: 1.2 mg/dL (ref 0.3–1.2)
Total Protein: 6.7 g/dL (ref 6.5–8.1)

## 2019-12-04 LAB — I-STAT CHEM 8, ED
BUN: 13 mg/dL (ref 8–23)
Calcium, Ion: 0.81 mmol/L — CL (ref 1.15–1.40)
Chloride: 105 mmol/L (ref 98–111)
Creatinine, Ser: 0.7 mg/dL (ref 0.61–1.24)
Glucose, Bld: 96 mg/dL (ref 70–99)
HCT: 51 % (ref 39.0–52.0)
Hemoglobin: 17.3 g/dL — ABNORMAL HIGH (ref 13.0–17.0)
Potassium: 3 mmol/L — ABNORMAL LOW (ref 3.5–5.1)
Sodium: 140 mmol/L (ref 135–145)
TCO2: 21 mmol/L — ABNORMAL LOW (ref 22–32)

## 2019-12-04 LAB — MAGNESIUM: Magnesium: 1.8 mg/dL (ref 1.7–2.4)

## 2019-12-04 LAB — TROPONIN I (HIGH SENSITIVITY)
Troponin I (High Sensitivity): 25 ng/L — ABNORMAL HIGH (ref <18)
Troponin I (High Sensitivity): 23 ng/L — ABNORMAL HIGH (ref <18)

## 2019-12-04 LAB — LIPASE, BLOOD: Lipase: 23 U/L (ref 11–51)

## 2019-12-04 IMAGING — DX DG CHEST 1V PORT
1 series · 1 of 1 positions shown · non-contrast
Comparison: [DATE]

CLINICAL DATA: Cough, watery diarrhea, diabetes, history of tobacco
abuse

EXAM:
PORTABLE CHEST 1 VIEW

[chest ap]
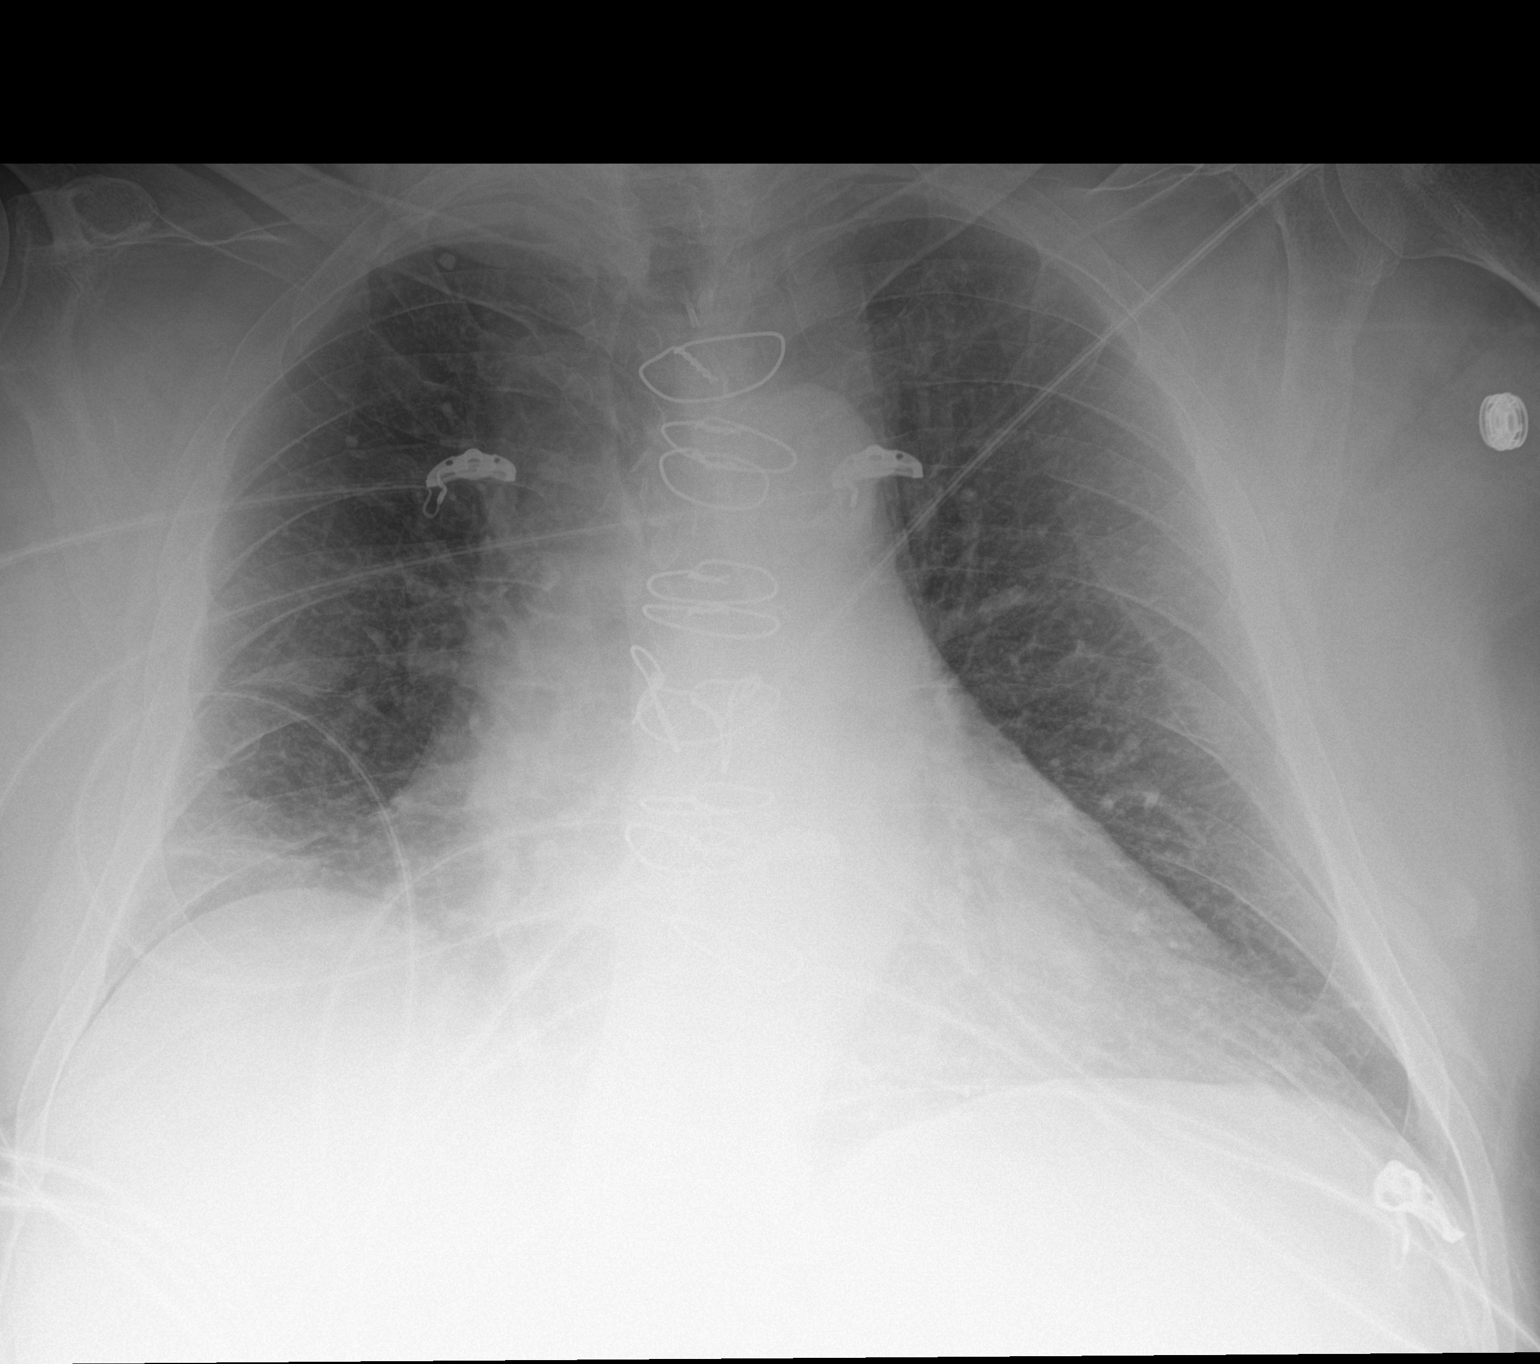

[1 of 1 positions shown; findings below may reference images not displayed]

FINDINGS: Single frontal view of the chest demonstrates an enlarged cardiac
silhouette. Postsurgical changes from median sternotomy. There is
vascular congestion with diffuse interstitial prominence. Patchy
consolidation at the right lung base. No effusion or pneumothorax.
IMPRESSION: 1. Findings consistent with mild fluid overload and interstitial
edema.

## 2019-12-04 IMAGING — CT CT HEAD W/O CM
3 series · 15 of 47 positions shown, 18 images · non-contrast
Comparison: [DATE]

CLINICAL DATA: Nausea, vomiting, diarrhea and confusion.

EXAM:
CT HEAD WITHOUT CONTRAST
TECHNIQUE: Contiguous axial images were obtained from the base of the skull
through the vertex without intravenous contrast.

[Series 5: coronal soft tissue · coronal · 0.34mm/px · 3 of 77 slices shown]
[im 26/77  brain]
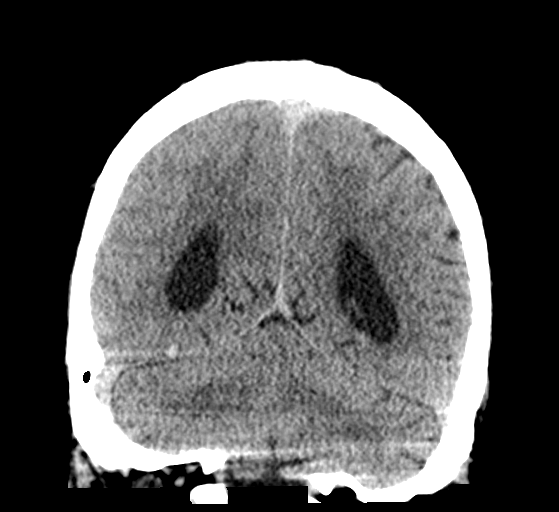
[im 34/77  brain]
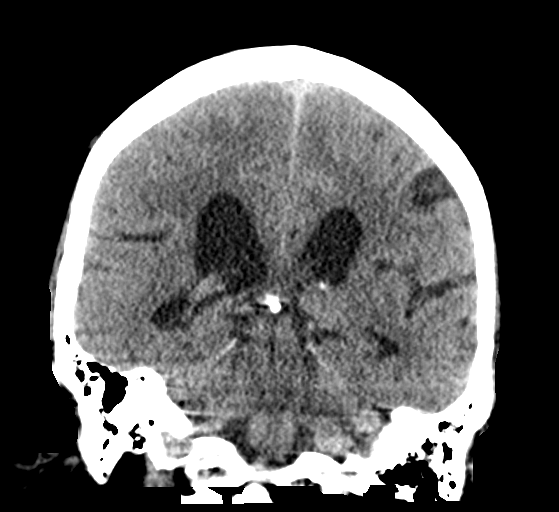
[im 43/77  brain]
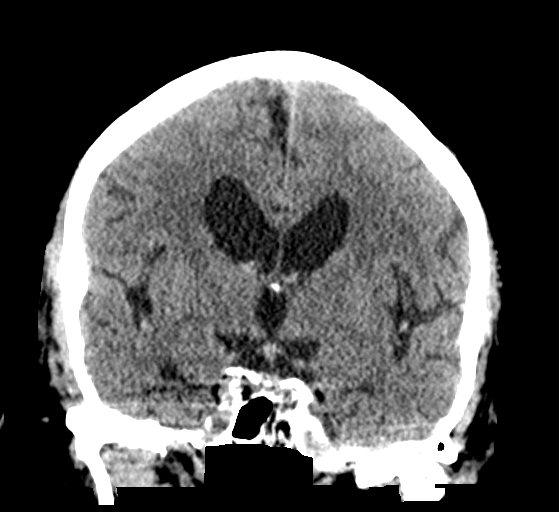

[Series 6: sagittal soft tissue · sagittal · 0.33mm/px · 3 of 63 slices shown]
[im 21/63  brain]
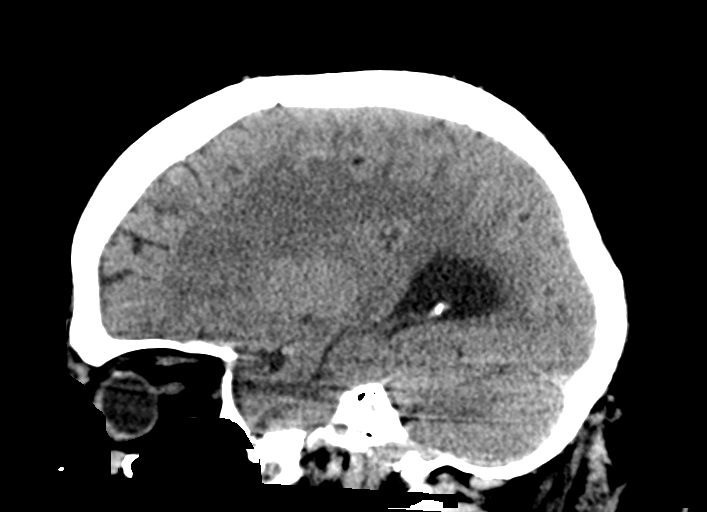
[im 32/63  brain]
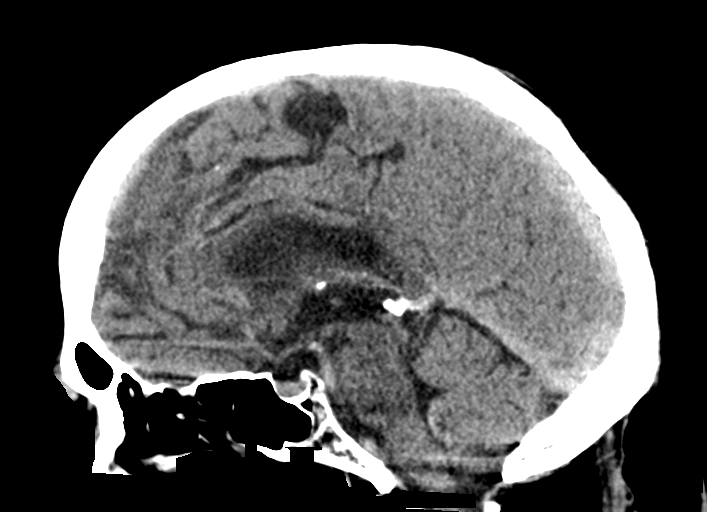
[im 42/63  brain]
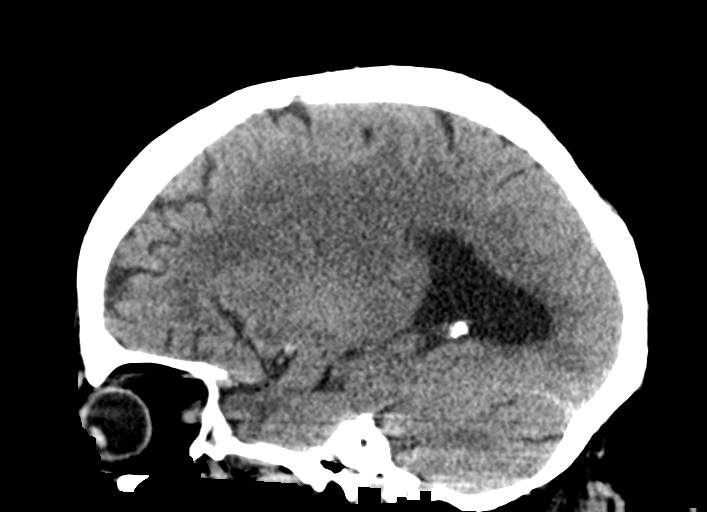

[Series 503: head wo · axial · 0.48mm/px · z∈[-136,+9]mm · 9 of 35 slices shown, 12 images]
[im 3/35  brain]
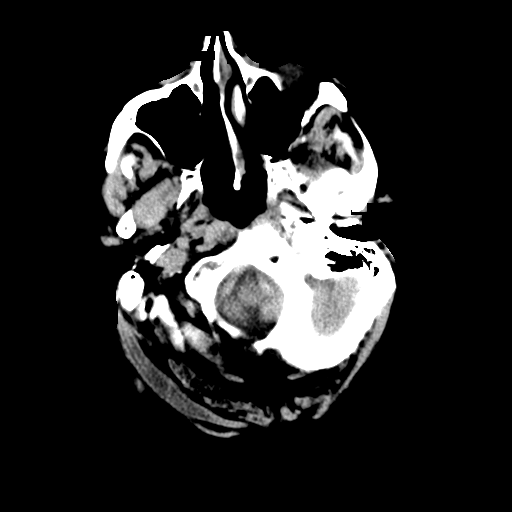
[im 3/35  bone]
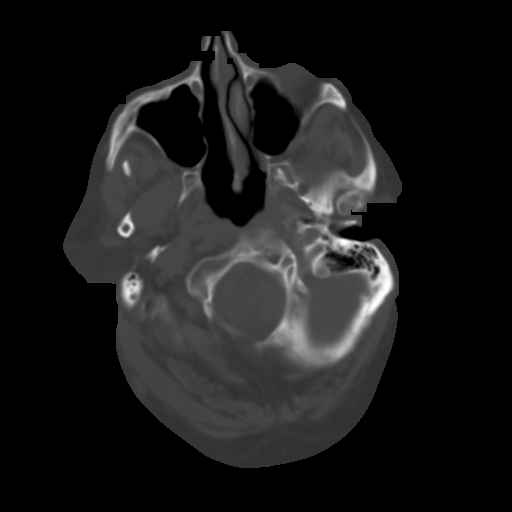
[im 6/35  brain]
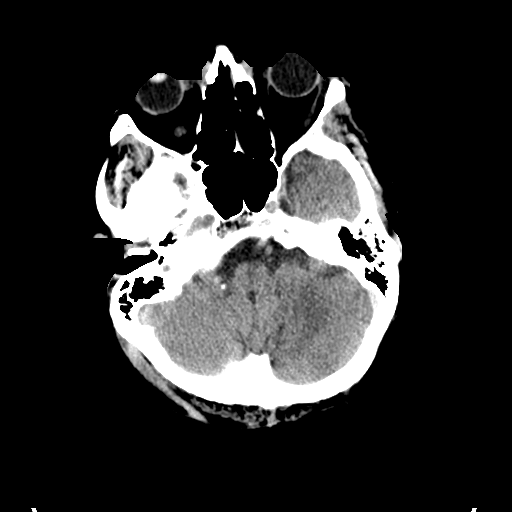
[im 10/35  brain]
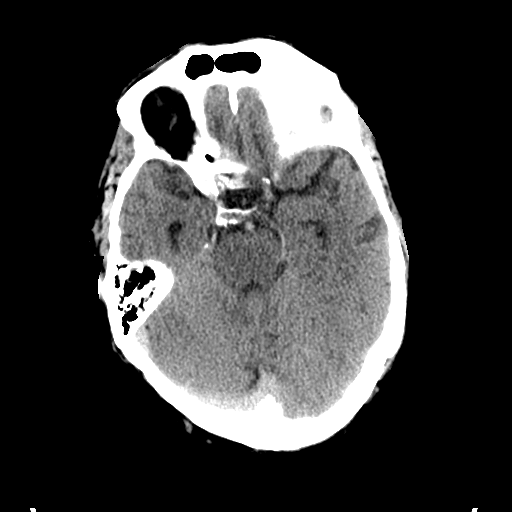
[im 13/35  brain]
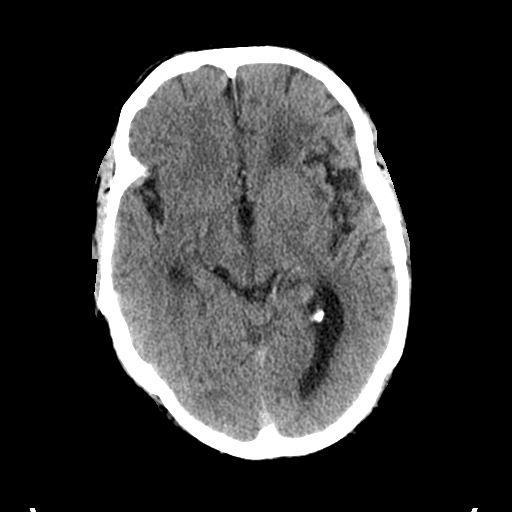
[im 18/35  brain]
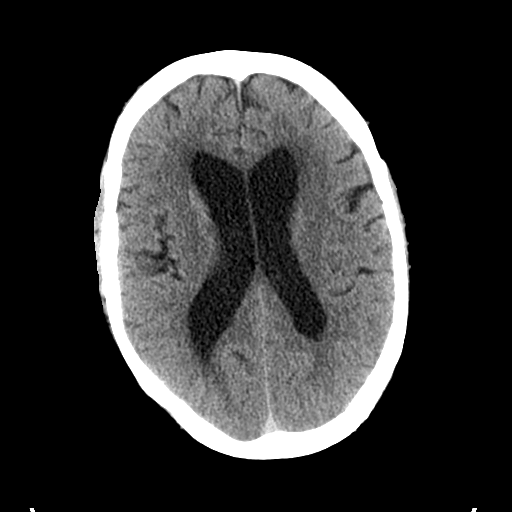
[im 18/35  bone]
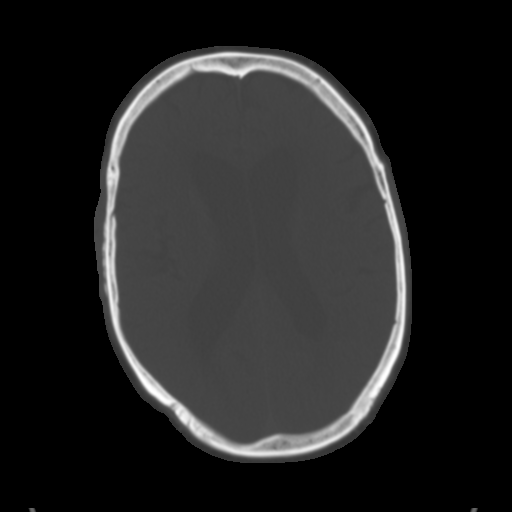
[im 22/35  brain]
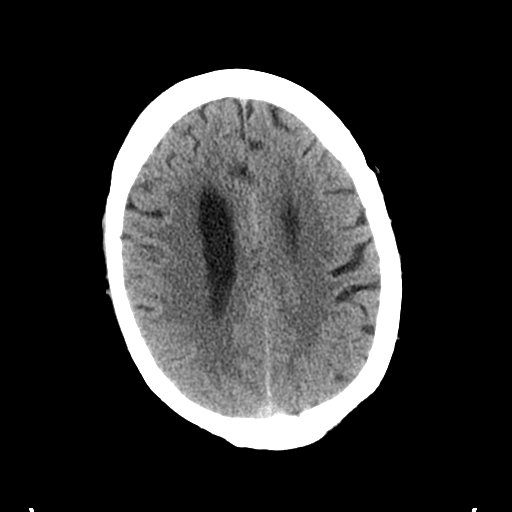
[im 25/35  brain]
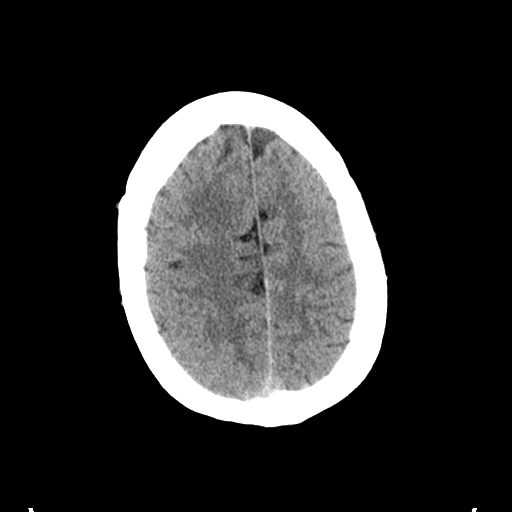
[im 29/35  brain]
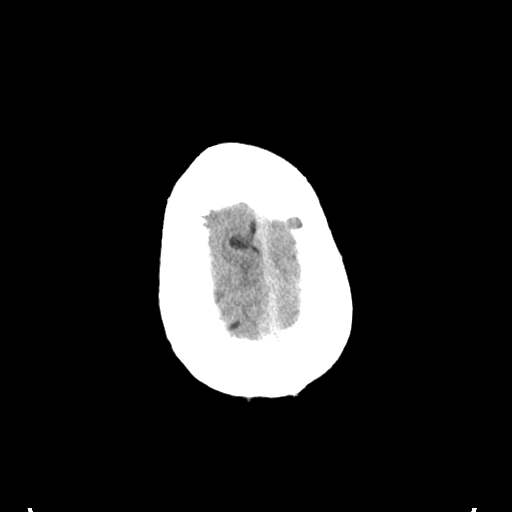
[im 32/35  brain]
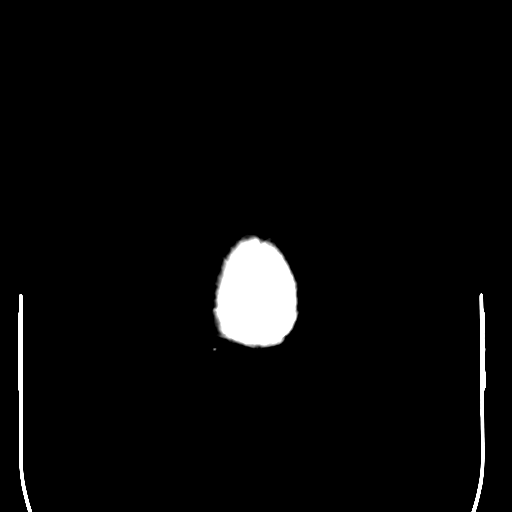
[im 32/35  bone]
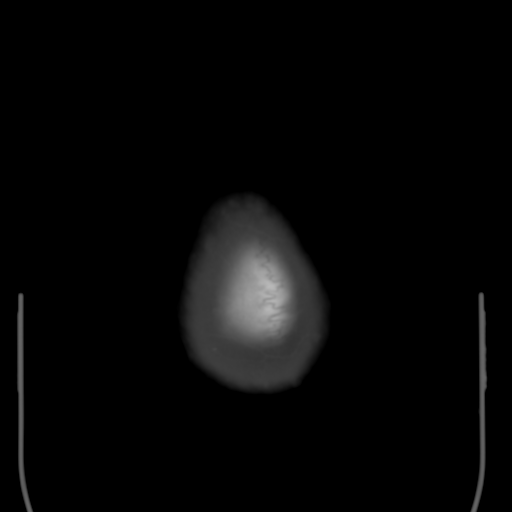

[15 of 47 positions shown; findings below may reference images not displayed]

FINDINGS: Brain: There is mild cerebral atrophy with widening of the
extra-axial spaces and ventricular dilatation.
There are areas of decreased attenuation within the white matter
tracts of the supratentorial brain, consistent with microvascular
disease changes.

Vascular: No hyperdense vessel or unexpected calcification.

Skull: Normal. Negative for fracture or focal lesion.

Sinuses/Orbits: No acute finding.

Other: None.
IMPRESSION: 1. Generalized cerebral atrophy.
2. No acute intracranial abnormality.

## 2019-12-04 IMAGING — CT CT ABD-PELV W/ CM
3 of 6 series · 14 of 46 positions shown, 16 images · IV contrast (omnipaque)
Comparison: [DATE]

CLINICAL DATA: Shortness of breath, nausea vomiting

EXAM:
CT ANGIOGRAPHY CHEST WITH CONTRAST
TECHNIQUE: Multidetector CT imaging of the chest was performed using the
standard protocol during bolus administration of intravenous
contrast. Multiplanar CT image reconstructions and MIPs were
obtained to evaluate the vascular anatomy.
CONTRAST:  100mL OMNIPAQUE IOHEXOL 350 MG/ML SOLN

[Series 3: axial st · axial · 0.85mm/px · z∈[-869,-439]mm · 9 of 108 slices shown, 11 images]
[im 11/108  soft-tissue]
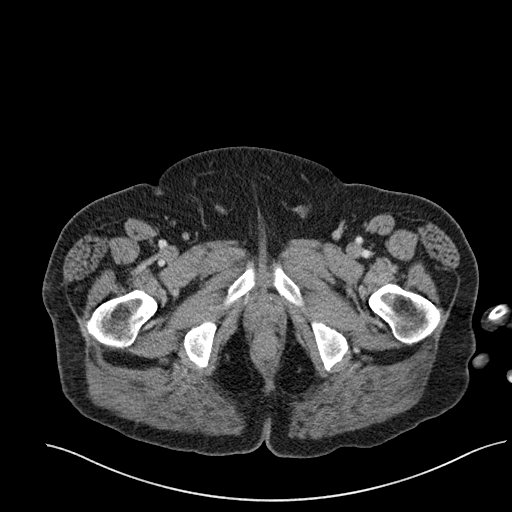
[im 11/108  bone]
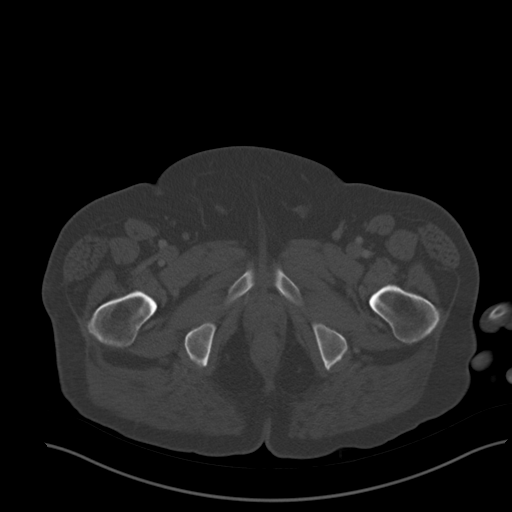
[im 22/108  soft-tissue]
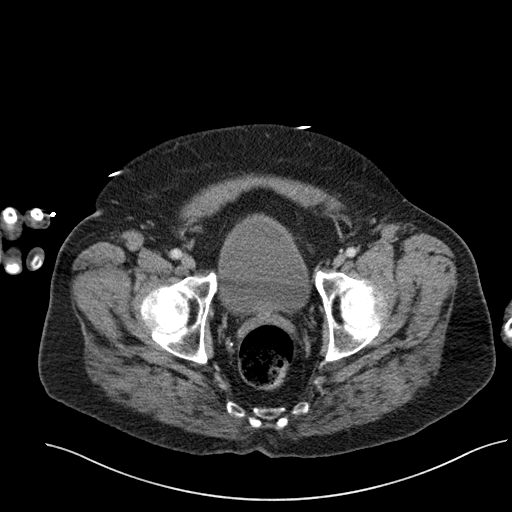
[im 33/108  soft-tissue]
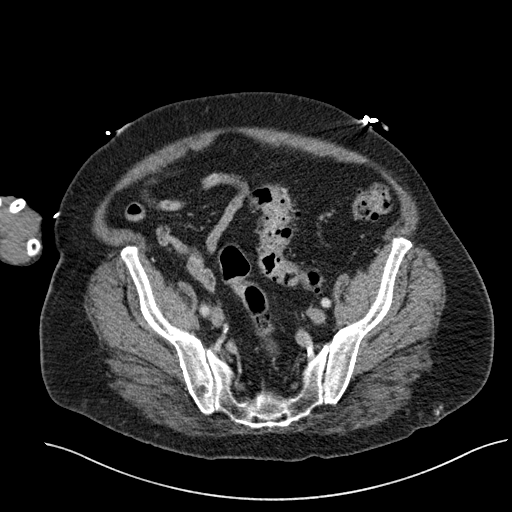
[im 43/108  soft-tissue]
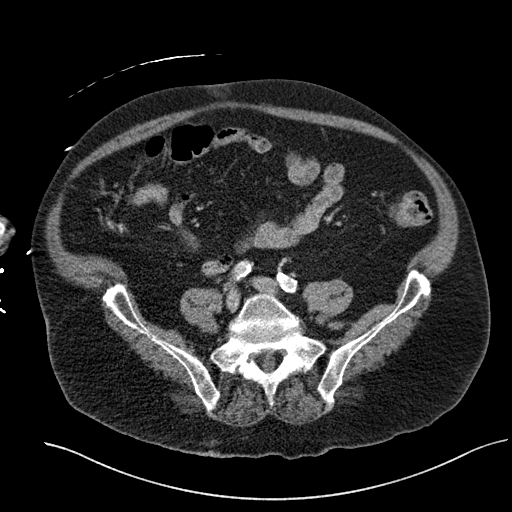
[im 54/108  soft-tissue]
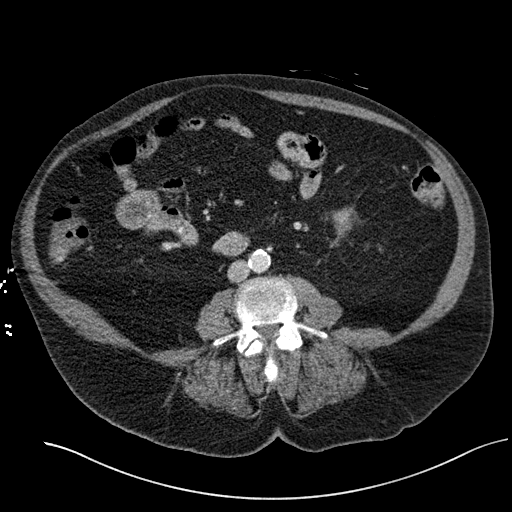
[im 65/108  soft-tissue]
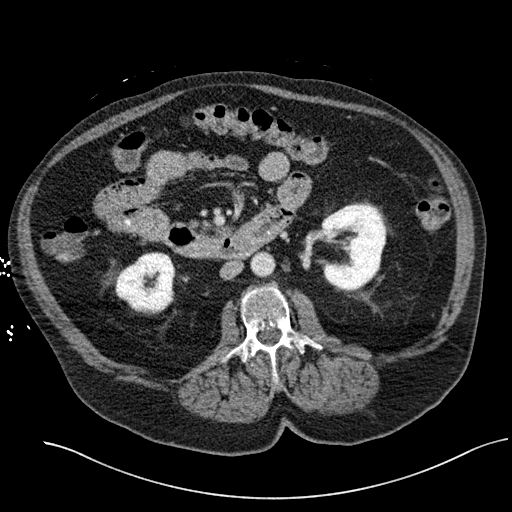
[im 75/108  soft-tissue]
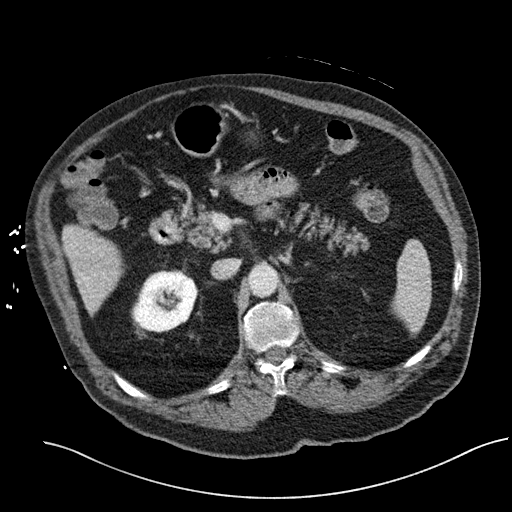
[im 86/108  soft-tissue]
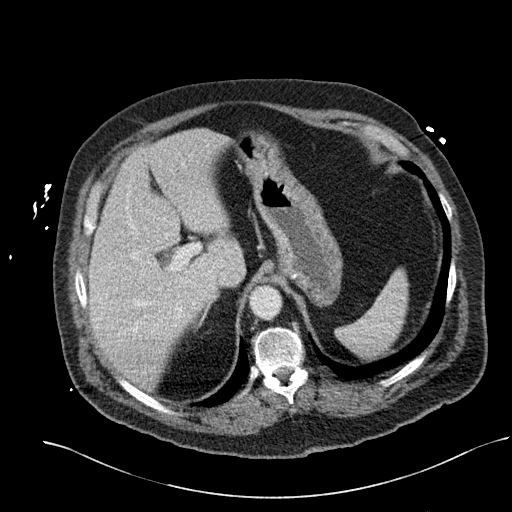
[im 97/108  soft-tissue]
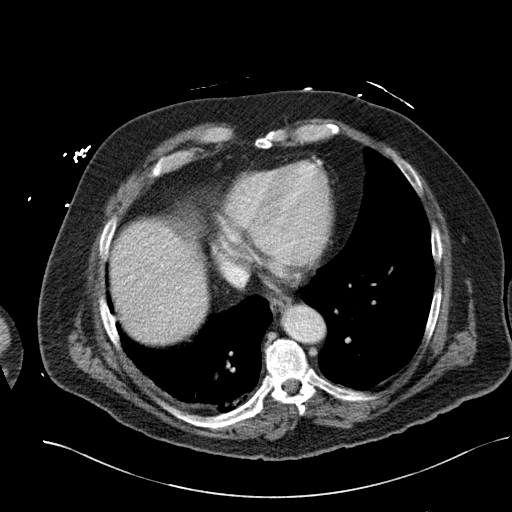
[im 97/108  bone]
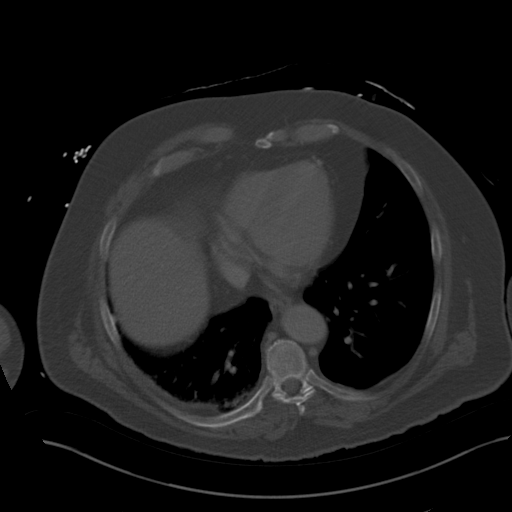

[Series 6: coronal st · coronal · 0.86mm/px · 3 of 115 slices shown]
[im 39/115  soft-tissue]
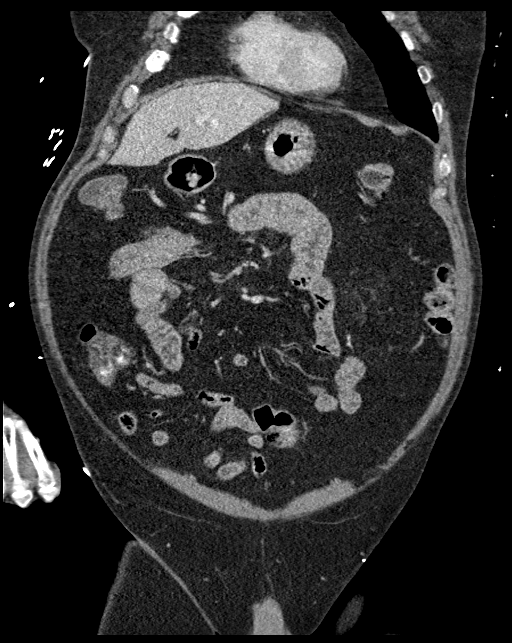
[im 51/115  soft-tissue]
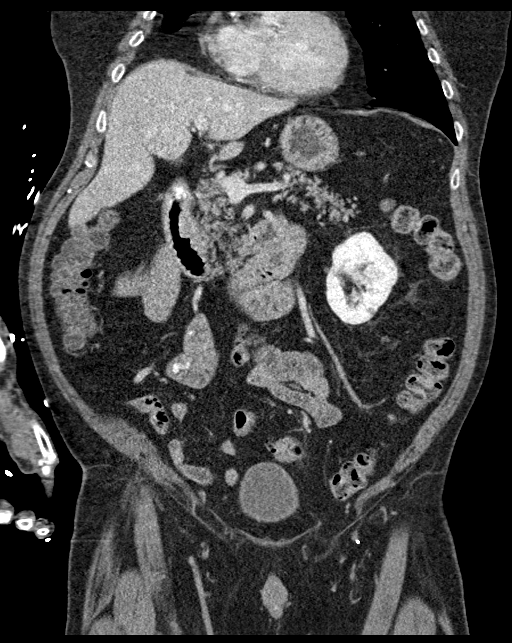
[im 64/115  soft-tissue]
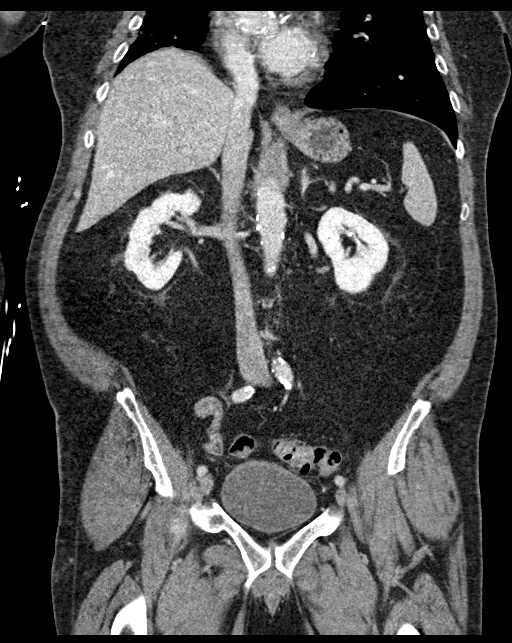

[Series 9: l-spine axial bone · axial · 0.29mm/px · z∈[-708,-686]mm · 2 of 114 slices shown]
[im 12/114  bone]
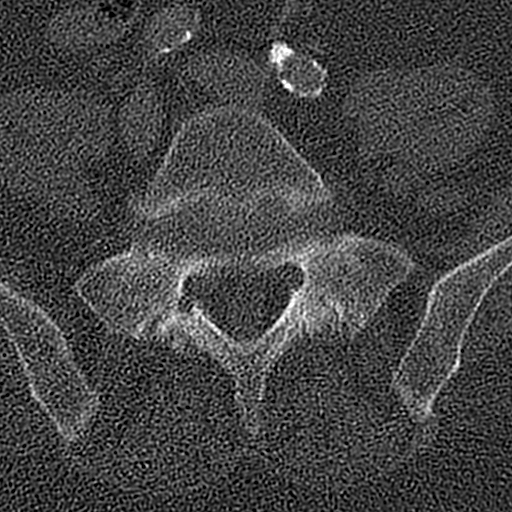
[im 23/114  bone]
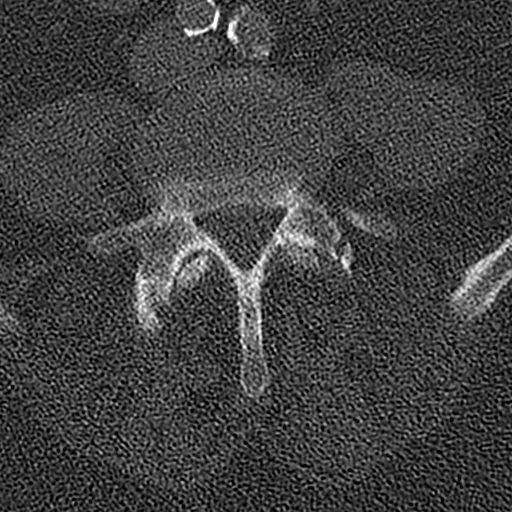

[14 of 46 positions shown; findings below may reference images not displayed]

FINDINGS: Cardiovascular: There is a optimal opacification of the pulmonary
arteries. There is no central,segmental, or subsegmental filling
defects within the pulmonary arteries. There is mild cardiomegaly.
No pericardial effusion or thickening. No evidence right heart
strain. There is normal three-vessel brachiocephalic anatomy without
proximal stenosis. The patient is status post aortic valve repair.
There is aneurysmal dilatation of the ascending intrathoracic aorta
measuring up to 4.6 cm in transverse dimension which tapers at the
level of the aortic arch. Scattered aortic atherosclerosis is seen.

Mediastinum/Nodes: No hilar, mediastinal, or axillary adenopathy.
Thyroid gland, trachea, and esophagus demonstrate no significant
findings.

Lungs/Pleura: Calcified granulomata at the right lung apex is seen.
Scattered atelectasis or scarring at the right lung base is seen. No
pleural effusion or pneumothorax. No airspace consolidation.

Musculoskeletal: No chest wall abnormality. No acute or significant
osseous findings. Overlying median sternotomy wires are present.

Review of the MIP images confirms the above findings.

Lower chest: The visualized heart size within normal limits. No
pericardial fluid/thickening.

No hiatal hernia.

The visualized portions of the lungs are clear.

Hepatobiliary: The liver is normal in density without focal
abnormality.The main portal vein is patent. No evidence of calcified
gallstones, gallbladder wall thickening or biliary dilatation.

Pancreas: Unremarkable. No pancreatic ductal dilatation or
surrounding inflammatory changes.

Spleen: Normal in size without focal abnormality.

Adrenals/Urinary Tract: Both adrenal glands appear normal. There is
scattered bilateral punctate calcifications. No hydronephrosis is
seen. No collecting system calculi is noted. There are several small
subcentimeter low-density lesions seen throughout both kidneys.
Bladder is unremarkable.

Stomach/Bowel: The stomach, small bowel, and colon are normal in
appearance. No inflammatory changes, wall thickening, or obstructive
findings.Scattered colonic diverticula is seen without
diverticulitis.

Vascular/Lymphatic: There are no enlarged mesenteric,
retroperitoneal, or pelvic lymph nodes. Scattered aortic
atherosclerotic calcifications are seen without aneurysmal
dilatation.

Reproductive: The prostate is unremarkable.

Other: Bilateral fat containing inguinal hernias are present.

Musculoskeletal: No acute or significant osseous findings.
IMPRESSION: 1. No central, segmental, or subsegmental pulmonary embolism.
2. Ascending thoracic aortic aneurysm measuring 4.6 cm in transverse
dimension. Recommend semi-annual imaging followup by CTA or MRA and
referral to cardiothoracic surgery if not already obtained. This
recommendation follows [9B]
ACCF/AHA/AATS/ACR/ASA/SCA/NIVIRUS/NIVIRUS/NIVIRUS/NIVIRUS Guidelines for the
Diagnosis and Management of Patients With Thoracic Aortic Disease.
Circulation. [9B]; 121: [id number]. Aortic aneurysm NOS ([9B]-[9B])
3. Scarring or atelectasis at the right lung base.
4. Scattered colonic diverticula without diverticulitis.
5. Bilateral non-obstructing renal calculi.
6.  Aortic Atherosclerosis ([9B]-[9B]).

## 2019-12-04 IMAGING — CT CT L SPINE W/O CM
3 of 4 series · 16 of 33 positions shown, 19 images · non-contrast
Comparison: None.

CLINICAL DATA: Status post trauma.

EXAM:
CT LUMBAR SPINE WITHOUT CONTRAST
TECHNIQUE: Multidetector CT imaging of the lumbar spine was performed without
intravenous contrast administration. Multiplanar CT image
reconstructions were also generated.

[Series 604: l-spine thin axial bone · axial · 0.30mm/px · z∈[-716,-526]mm · 8 of 476 slices shown, 10 images]
[im 48/476  soft-tissue]
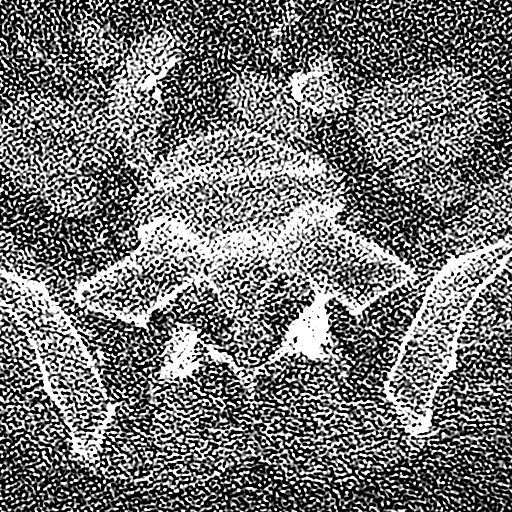
[im 48/476  bone]
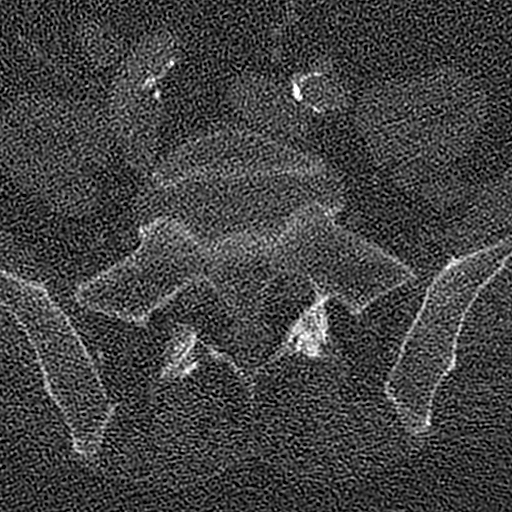
[im 96/476  bone]
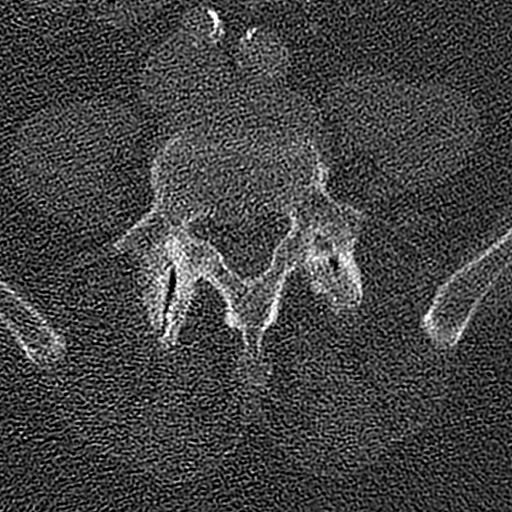
[im 143/476  bone]
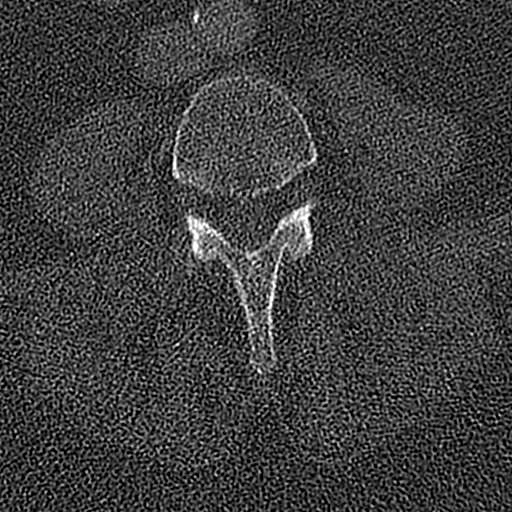
[im 191/476  bone]
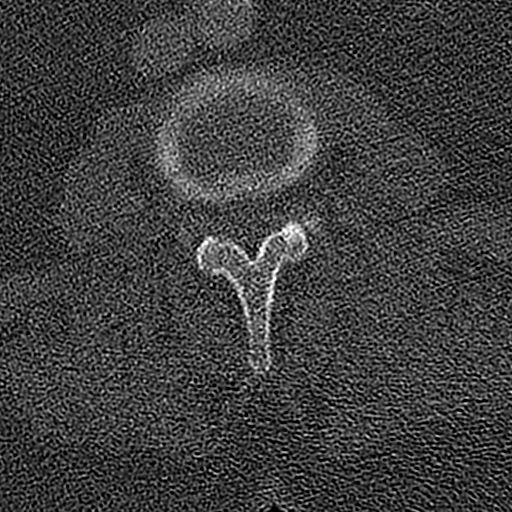
[im 286/476  soft-tissue]
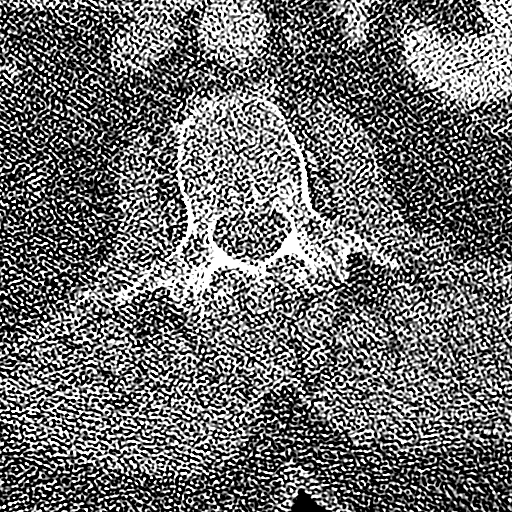
[im 286/476  bone]
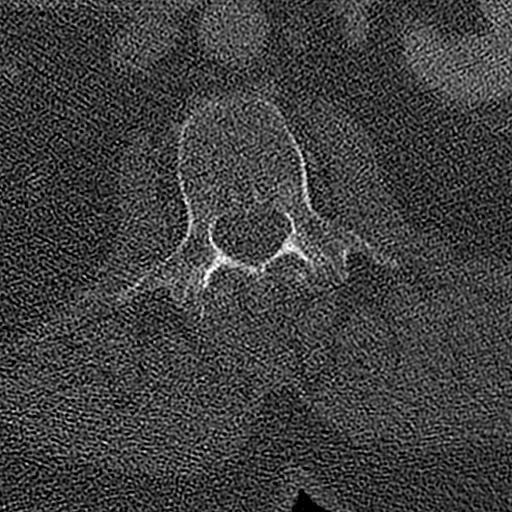
[im 333/476  bone]
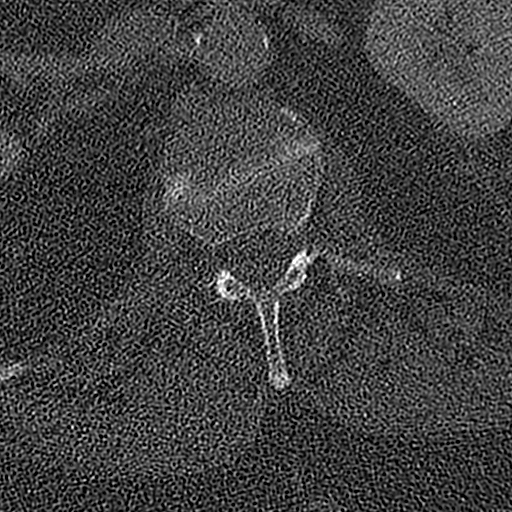
[im 381/476  bone]
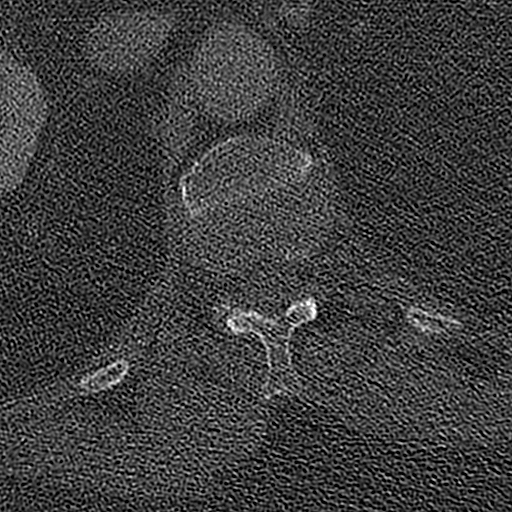
[im 428/476  bone]
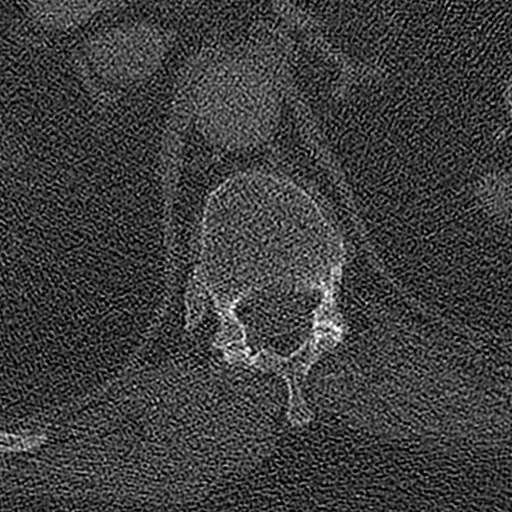

[Series 605: coronal l-spine · coronal · 0.47mm/px · 3 of 56 slices shown]
[im 12/56  bone]
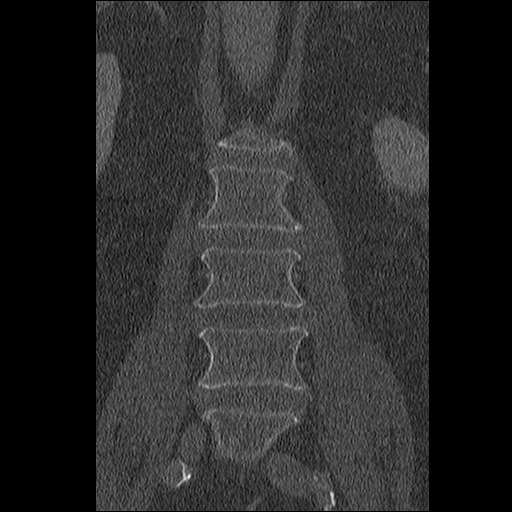
[im 23/56  bone]
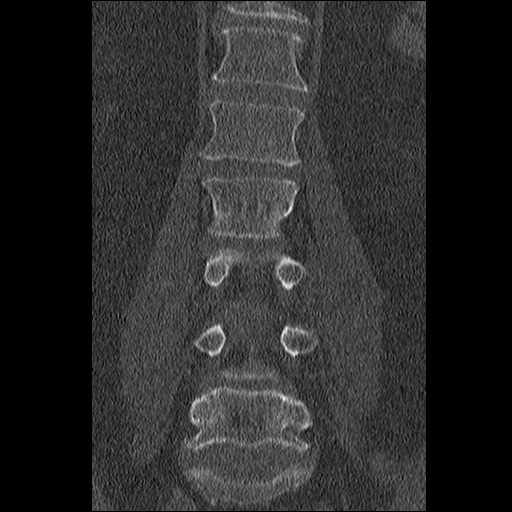
[im 34/56  bone]
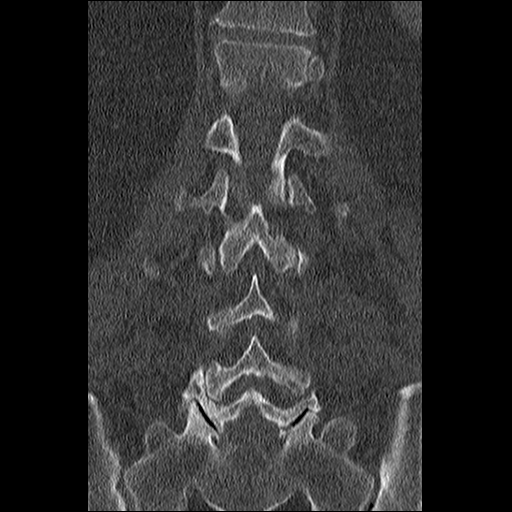

[Series 606: sagital l-spine · sagittal · 0.47mm/px · 5 of 41 slices shown, 6 images]
[im 14/41  bone]
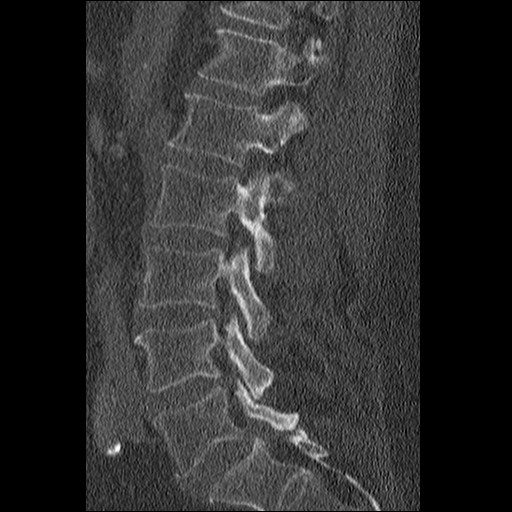
[im 17/41  bone]
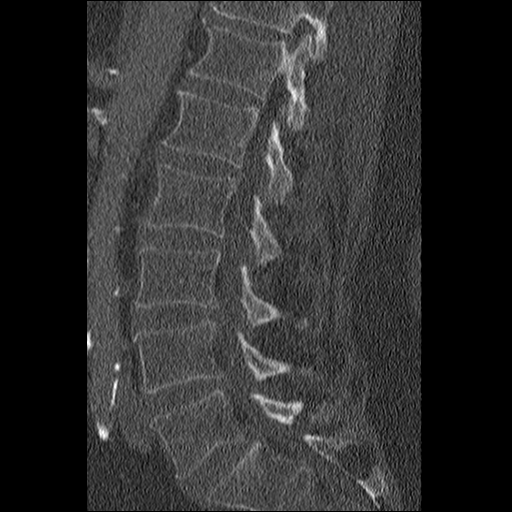
[im 21/41  soft-tissue]
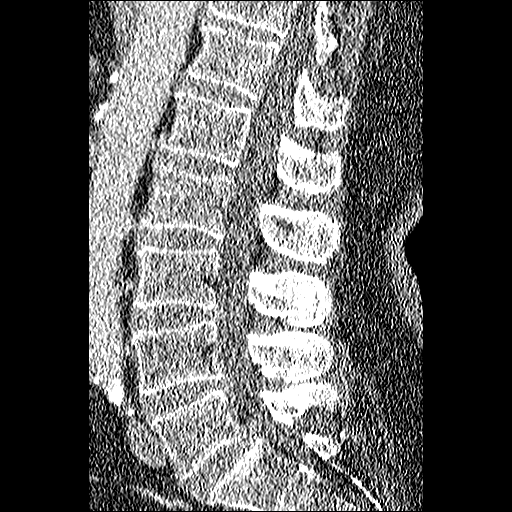
[im 21/41  bone]
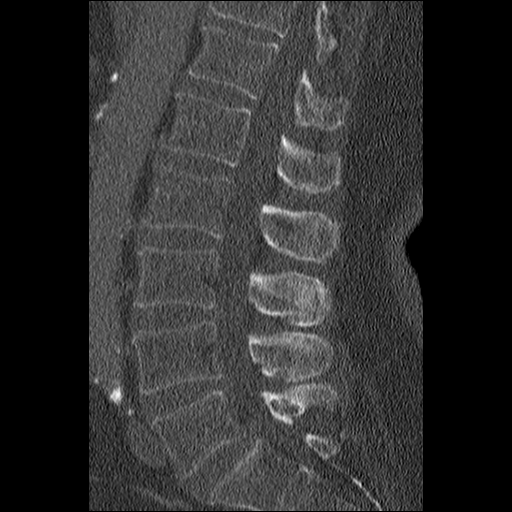
[im 24/41  bone]
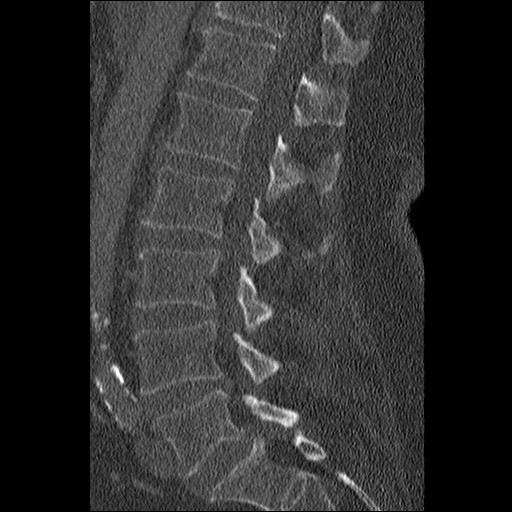
[im 27/41  bone]
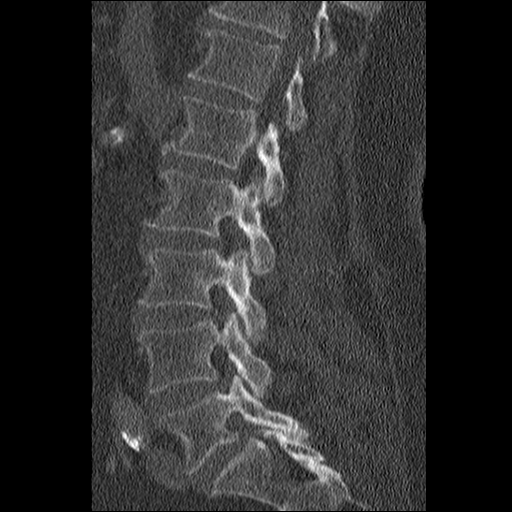

[16 of 33 positions shown; findings below may reference images not displayed]

FINDINGS: Segmentation: 5 lumbar type vertebrae.

Alignment: Normal.

Vertebrae: No acute fracture or focal pathologic process.

Paraspinal and other soft tissues: Negative.

Disc levels: Mild multilevel endplate sclerosis is seen with very
mild multilevel intervertebral disc space narrowing. Mild multilevel
bilateral facet joint hypertrophy is seen. This is most prominent at
the levels of L4 and L5.
IMPRESSION: 1. No acute osseous abnormality.
2. Mild multilevel degenerative changes.

## 2019-12-04 IMAGING — CT CT ANGIO CHEST
2 of 6 series · 17 of 36 positions shown · IV contrast (omnipaque)
Comparison: [DATE]

CLINICAL DATA: Shortness of breath, nausea vomiting

EXAM:
CT ANGIOGRAPHY CHEST WITH CONTRAST
TECHNIQUE: Multidetector CT imaging of the chest was performed using the
standard protocol during bolus administration of intravenous
contrast. Multiplanar CT image reconstructions and MIPs were
obtained to evaluate the vascular anatomy.
CONTRAST:  100mL OMNIPAQUE IOHEXOL 350 MG/ML SOLN

[Series 4: thins · axial · 0.83mm/px · z∈[-528,-272]mm · 16 of 286 slices shown]
[im 15/286  lung]
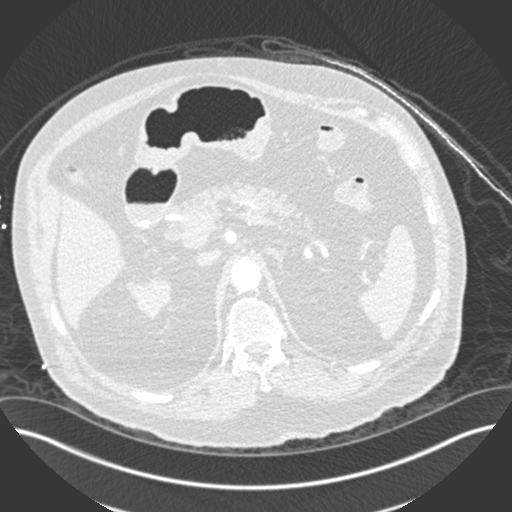
[im 29/286  mediastinal]
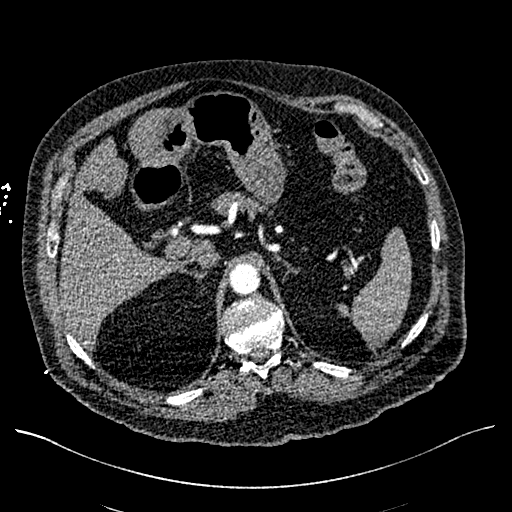
[im 43/286  lung]
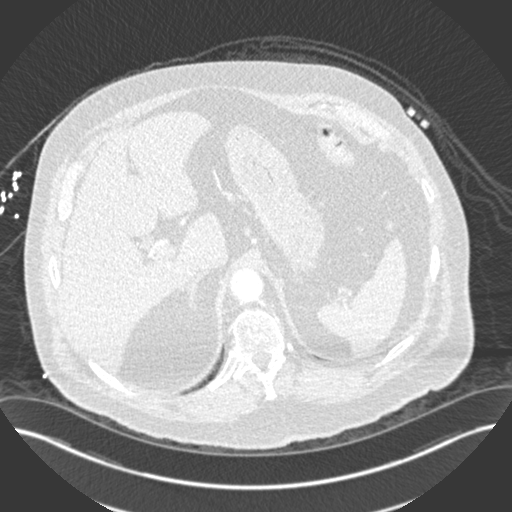
[im 72/286  mediastinal]
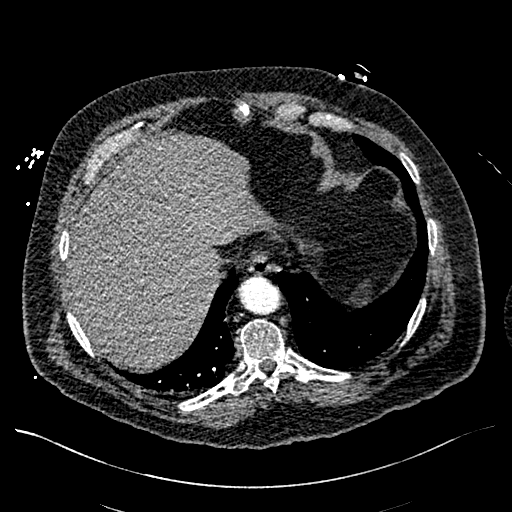
[im 86/286  lung]
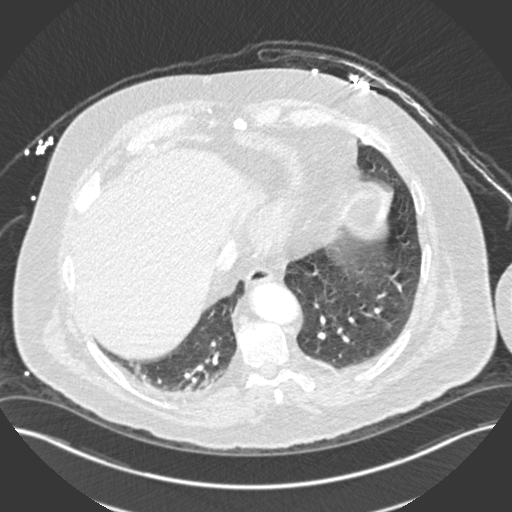
[im 100/286  mediastinal]
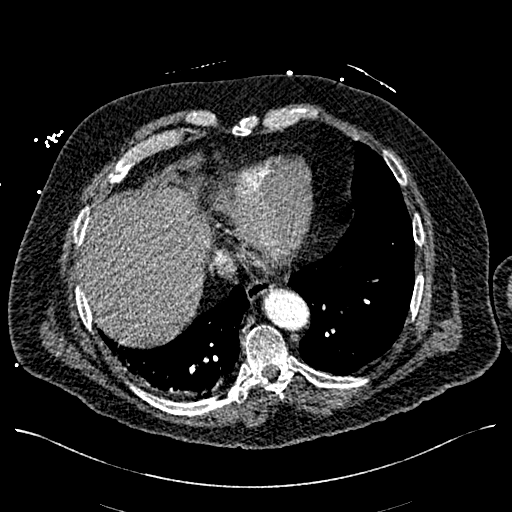
[im 115/286  lung]
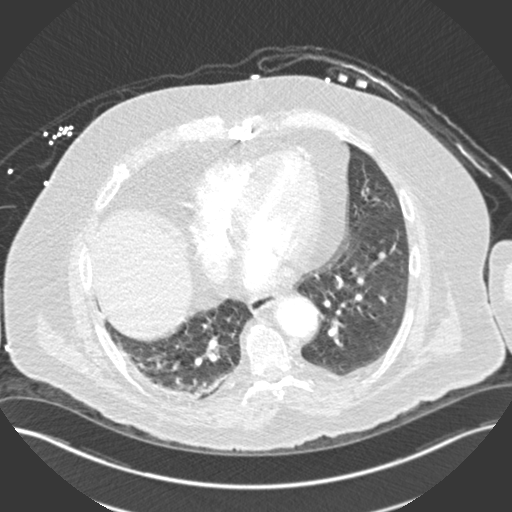
[im 129/286  mediastinal]
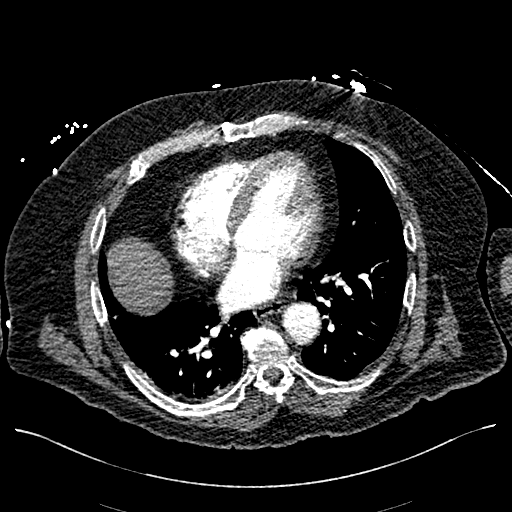
[im 157/286  lung]
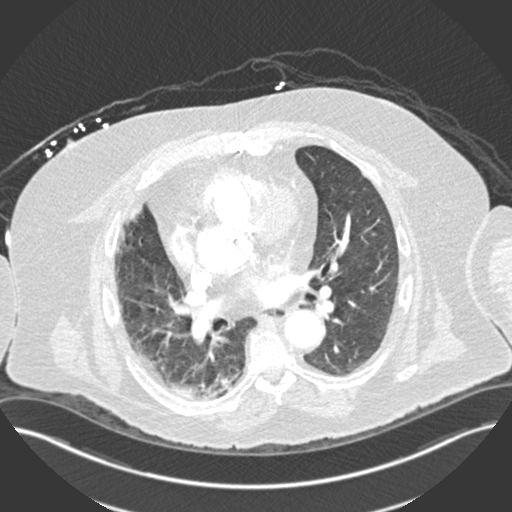
[im 172/286  mediastinal]
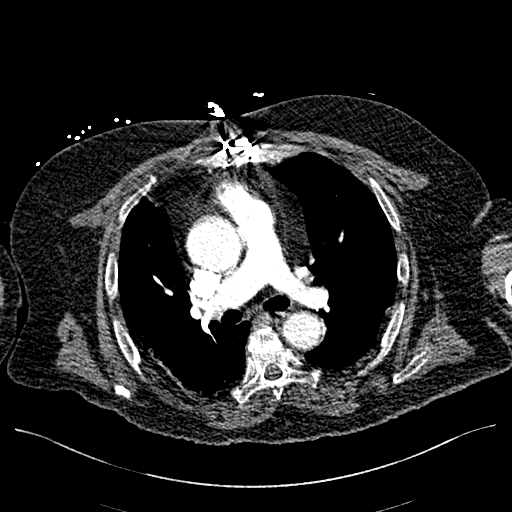
[im 186/286  lung]
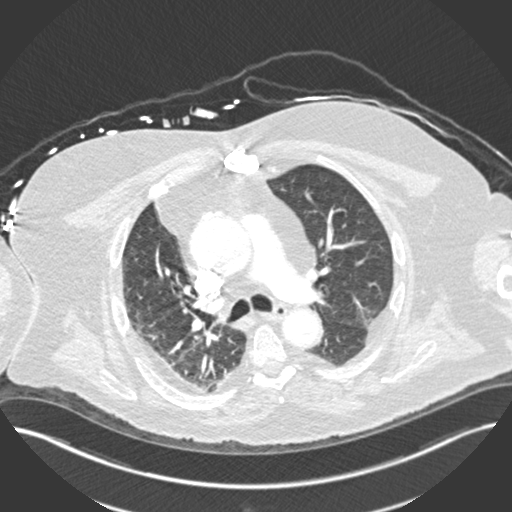
[im 200/286  mediastinal]
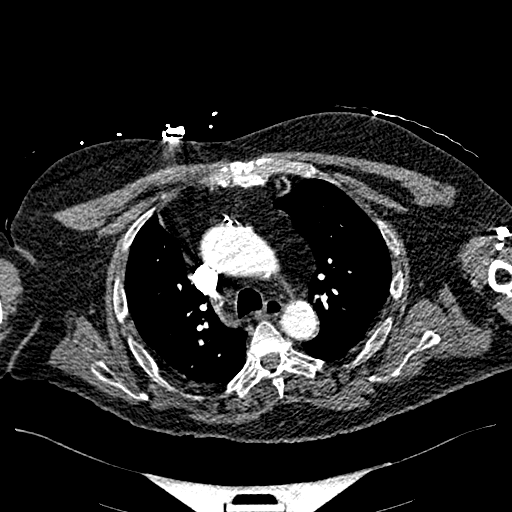
[im 214/286  lung]
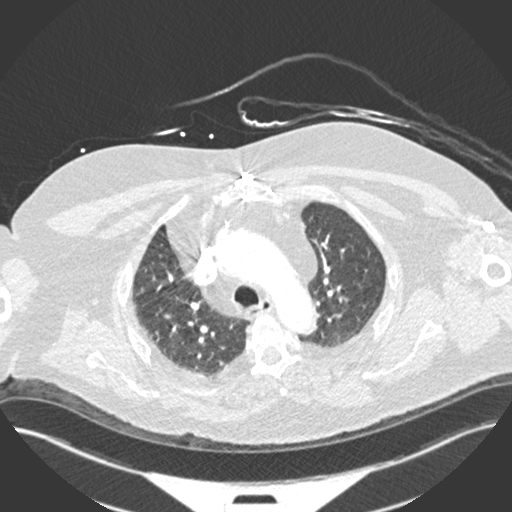
[im 243/286  mediastinal]
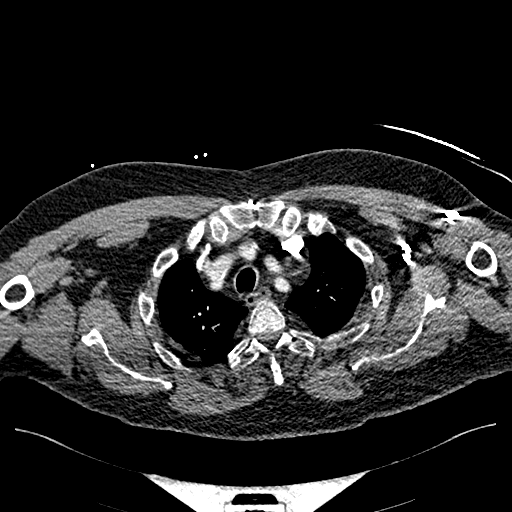
[im 257/286  lung]
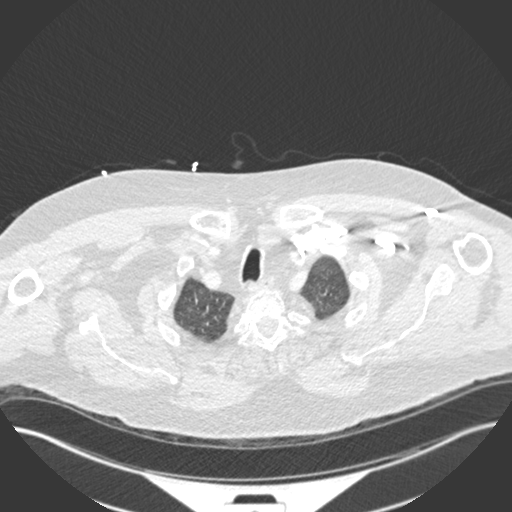
[im 271/286  mediastinal]
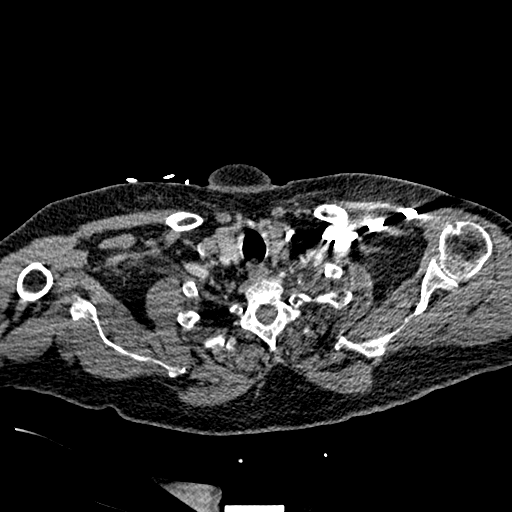

[Series 6: coronal mpr · coronal · 0.57mm/px · 1 of 168 slices shown]
[im 84/168  mediastinal]
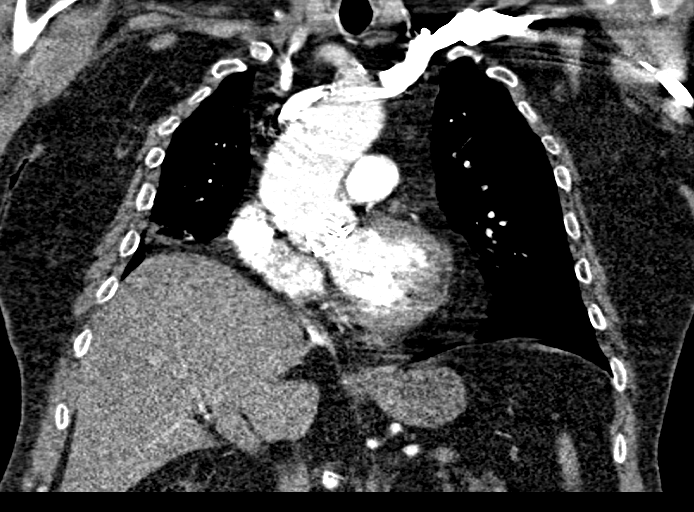

[17 of 36 positions shown; findings below may reference images not displayed]

FINDINGS: Cardiovascular: There is a optimal opacification of the pulmonary
arteries. There is no central,segmental, or subsegmental filling
defects within the pulmonary arteries. There is mild cardiomegaly.
No pericardial effusion or thickening. No evidence right heart
strain. There is normal three-vessel brachiocephalic anatomy without
proximal stenosis. The patient is status post aortic valve repair.
There is aneurysmal dilatation of the ascending intrathoracic aorta
measuring up to 4.6 cm in transverse dimension which tapers at the
level of the aortic arch. Scattered aortic atherosclerosis is seen.

Mediastinum/Nodes: No hilar, mediastinal, or axillary adenopathy.
Thyroid gland, trachea, and esophagus demonstrate no significant
findings.

Lungs/Pleura: Calcified granulomata at the right lung apex is seen.
Scattered atelectasis or scarring at the right lung base is seen. No
pleural effusion or pneumothorax. No airspace consolidation.

Musculoskeletal: No chest wall abnormality. No acute or significant
osseous findings. Overlying median sternotomy wires are present.

Review of the MIP images confirms the above findings.

Lower chest: The visualized heart size within normal limits. No
pericardial fluid/thickening.

No hiatal hernia.

The visualized portions of the lungs are clear.

Hepatobiliary: The liver is normal in density without focal
abnormality.The main portal vein is patent. No evidence of calcified
gallstones, gallbladder wall thickening or biliary dilatation.

Pancreas: Unremarkable. No pancreatic ductal dilatation or
surrounding inflammatory changes.

Spleen: Normal in size without focal abnormality.

Adrenals/Urinary Tract: Both adrenal glands appear normal. There is
scattered bilateral punctate calcifications. No hydronephrosis is
seen. No collecting system calculi is noted. There are several small
subcentimeter low-density lesions seen throughout both kidneys.
Bladder is unremarkable.

Stomach/Bowel: The stomach, small bowel, and colon are normal in
appearance. No inflammatory changes, wall thickening, or obstructive
findings.Scattered colonic diverticula is seen without
diverticulitis.

Vascular/Lymphatic: There are no enlarged mesenteric,
retroperitoneal, or pelvic lymph nodes. Scattered aortic
atherosclerotic calcifications are seen without aneurysmal
dilatation.

Reproductive: The prostate is unremarkable.

Other: Bilateral fat containing inguinal hernias are present.

Musculoskeletal: No acute or significant osseous findings.
IMPRESSION: 1. No central, segmental, or subsegmental pulmonary embolism.
2. Ascending thoracic aortic aneurysm measuring 4.6 cm in transverse
dimension. Recommend semi-annual imaging followup by CTA or MRA and
referral to cardiothoracic surgery if not already obtained. This
recommendation follows [9B]
ACCF/AHA/AATS/ACR/ASA/SCA/NIVIRUS/NIVIRUS/NIVIRUS/NIVIRUS Guidelines for the
Diagnosis and Management of Patients With Thoracic Aortic Disease.
Circulation. [9B]; 121: [id number]. Aortic aneurysm NOS ([9B]-[9B])
3. Scarring or atelectasis at the right lung base.
4. Scattered colonic diverticula without diverticulitis.
5. Bilateral non-obstructing renal calculi.
6.  Aortic Atherosclerosis ([9B]-[9B]).

## 2019-12-04 MED ORDER — SODIUM CHLORIDE (PF) 0.9 % IJ SOLN
INTRAMUSCULAR | Status: AC
  Filled 2019-12-04: qty 50

## 2019-12-04 MED ORDER — SODIUM CHLORIDE 0.9 % IV BOLUS
1000.0000 mL | Freq: Once | INTRAVENOUS | Status: AC
  Administered 2019-12-04: 21:00:00 1000 mL via INTRAVENOUS

## 2019-12-04 MED ORDER — IOHEXOL 350 MG/ML SOLN
100.0000 mL | Freq: Once | INTRAVENOUS | Status: AC | PRN
  Administered 2019-12-04: 100 mL via INTRAVENOUS

## 2019-12-04 MED ORDER — POTASSIUM CHLORIDE CRYS ER 20 MEQ PO TBCR
40.0000 meq | EXTENDED_RELEASE_TABLET | Freq: Once | ORAL | Status: AC
  Administered 2019-12-04: 40 meq via ORAL
  Filled 2019-12-04: qty 2

## 2019-12-04 MED ORDER — POTASSIUM CHLORIDE 10 MEQ/100ML IV SOLN
10.0000 meq | INTRAVENOUS | Status: DC
  Administered 2019-12-04 (×2): 10 meq via INTRAVENOUS
  Filled 2019-12-04 (×2): qty 100

## 2019-12-04 NOTE — ED Provider Notes (Signed)
[facility] DEPT Provider Note   CSN: [id number] Arrival date & time: 12/04/19  2007     History Chief Complaint  Patient presents with  . Emesis  . Nausea  . Diarrhea    Michael Hines is a 74 y.o. male history diabetes, hypertension, hyperlipidemia here presenting with vomiting, diarrhea, weakness.  Patient initially told me that he has been weak since yesterday and has been having vomiting and diarrhea since yesterday.  States that he is too weak to get out of bed due to the diarrhea and vomiting.  When wife got here, she states that he fell about a week ago..  Per chart review, he apparently fell onto the left rib and had a rib series that were negative a week ago.  Unclear if he hit his head or not.  The history is provided by the patient, the EMS personnel and a relative.       Past Medical History:  Diagnosis Date  . Anxiety   . B12 deficiency   . Back pain   . Diabetes mellitus without complication ([place])   . Hyperlipidemia   . Testosterone insufficiency     Patient Active Problem List   Diagnosis Date Noted  . Thoracic aortic aneurysm without rupture ([place]) 09/14/2019  . Delirium 09/09/2018  . Hypokalemia 09/09/2018  . Bacteremia   . Sepsis due to undetermined organism ([place]) 09/01/2018  . Diabetes mellitus without complication ([place]) [id number]  . Hyponatremia 09/01/2018  . Polycythemia 09/01/2018  . Abnormal LFTs 09/01/2018  . Hypophosphatemia 09/01/2018  . Altered mental status 09/01/2018    Past Surgical History:  Procedure Laterality Date  . AORTIC VALVE REPLACEMENT  2013   redo bioprosthetic valve at [facility], Dr Mauricio Po  . CHOLECYSTECTOMY    . STRABISMUS SURGERY    . TEE WITHOUT CARDIOVERSION N/A 09/07/2018   Procedure: TRANSESOPHAGEAL ECHOCARDIOGRAM (TEE);  Surgeon: Acie Fredrickson Wonda Cheng, MD;  Location: [facility] ENDOSCOPY;  Service: Cardiovascular;  Laterality: N/A;       Family History  Problem Relation Age of Onset  .  Hypertension Mother   . Diabetes Mother     Social History   Tobacco Use  . Smoking status: Former Smoker    Packs/day: 1.50    Years: 55.00    Pack years: 82.50    Types: Cigarettes    Quit date: 08/24/2018    Years since quitting: 1.2  . Smokeless tobacco: Former Network engineer Use Topics  . Alcohol use: Not Currently  . Drug use: Never    Home Medications Prior to Admission medications   Medication Sig Start Date End Date Taking? Authorizing Provider  aspirin EC 81 MG tablet Take 81 mg by mouth every evening.    Yes [provider]  atorvastatin (LIPITOR) 20 MG tablet Take 20 mg by mouth at bedtime. 06/30/18  Yes [provider]  budesonide-formoterol (SYMBICORT) 80-4.5 MCG/ACT inhaler Inhale 2 puffs into the lungs 2 (two) times daily.   Yes [provider]  buPROPion (WELLBUTRIN XL) 300 MG 24 hr tablet Take 300 mg by mouth daily. 07/04/19   [provider]  citalopram (CELEXA) 20 MG tablet Take 1 tablet (20 mg total) by mouth daily for 30 days. 09/15/18 08/31/19  Donne Hazel, MD  clotrimazole-betamethasone (LOTRISONE) cream Apply 1 application topically 2 (two) times daily as needed for rash. 08/03/19   [provider]  cyanocobalamin (,VITAMIN B-12,) 1000 MCG/ML injection Inject 1,000 mcg into the muscle every 30 (thirty) days.  08/17/18   [provider]  gabapentin (NEURONTIN) 300 MG capsule Take 300-600 mg by mouth See admin instructions. Take 300mg  in the morning and 600mg  in the evening. 08/23/19   [provider]  HYDROcodone-acetaminophen (NORCO) 10-325 MG tablet Take 1 tablet by mouth every 6 (six) hours as needed for pain. 07/04/19   [provider]  ibuprofen (ADVIL,MOTRIN) 600 MG tablet Take 1 tablet (600 mg total) by mouth every 6 (six) hours as needed for moderate pain. 09/14/18   Donne Hazel, MD  loperamide (IMODIUM) 2 MG capsule Take 1 capsule (2 mg total) by mouth as needed for diarrhea or  loose stools. 09/07/18   Kayleen Memos, DO  LORazepam (ATIVAN) 0.5 MG tablet Take 1 tablet (0.5 mg total) by mouth at bedtime. 09/09/18   Lawyer, Harrell Gave, PA-C  pioglitazone (ACTOS) 15 MG tablet Take 15 mg by mouth daily. 07/04/19   [provider]  testosterone cypionate (DEPOTESTOSTERONE CYPIONATE) 200 MG/ML injection Inject 200 mg into the muscle every 14 (fourteen) days. 05/06/19   [provider]  tiZANidine (ZANAFLEX) 4 MG tablet Take 4-8 mg by mouth every 8 (eight) hours as needed. 09/11/19   [provider]    Allergies    Patient has no known allergies.  Review of Systems   Review of Systems  Gastrointestinal: Positive for diarrhea and vomiting.  Neurological: Positive for dizziness and weakness.  All other systems reviewed and are negative.   Physical Exam Updated Vital Signs BP (!) 135/97   Pulse 91   Temp 98.9 F (37.2 C) (Oral)   Resp 20   Ht 5\' 9"  (1.753 m)   Wt 97.5 kg   SpO2 93%   BMI 31.74 kg/m   Physical Exam Vitals and nursing note reviewed.  Constitutional:      Comments: Tired and dehydrated.  HENT:     Head: Normocephalic.     Nose: Nose normal.     Mouth/Throat:     Mouth: Mucous membranes are dry.  Eyes:     Extraocular Movements: Extraocular movements intact.     Pupils: Pupils are equal, round, and reactive to light.  Cardiovascular:     Rate and Rhythm: Regular rhythm. Tachycardia present.     Pulses: Normal pulses.     Heart sounds: Normal heart sounds.  Pulmonary:     Comments: Slightly tachypneic diminished bilaterally.  Tender left lower ribs but no obvious ecchymosis or deformity Abdominal:     General: Abdomen is flat.     Palpations: Abdomen is soft.  Musculoskeletal:        General: Normal range of motion.     Cervical back: Normal range of motion.     Comments: Mild lower lumbar tenderness, no saddle anesthesia   Skin:    General: Skin is warm.  Neurological:     General: No focal deficit  present.     Comments: Strength is 4 out of 5 bilateral legs.  He has good reflexes bilaterally.  Normal grip strength bilateral arms.  No facial droop.   Psychiatric:        Mood and Affect: Mood normal.        Behavior: Behavior normal.     ED Results / Procedures / Treatments   Labs (all labs ordered are listed, but only abnormal results are displayed) Labs Reviewed  CBC WITH DIFFERENTIAL/PLATELET - Abnormal; Notable for the following components:      Result Value   WBC 11.9 (*)  RBC 5.94 (*)    Hemoglobin 18.2 (*)    HCT 55.5 (*)    Platelets 135 (*)    Neutro Abs 10.1 (*)    Abs Immature Granulocytes 0.12 (*)    All other components within normal limits  COMPREHENSIVE METABOLIC PANEL - Abnormal; Notable for the following components:   Sodium 134 (*)    Glucose, Bld 113 (*)    Calcium 8.4 (*)    All other components within normal limits  I-STAT CHEM 8, ED - Abnormal; Notable for the following components:   Potassium 3.0 (*)    Calcium, Ion 0.81 (*)    TCO2 21 (*)    Hemoglobin 17.3 (*)    All other components within normal limits  TROPONIN I (HIGH SENSITIVITY) - Abnormal; Notable for the following components:   Troponin I (High Sensitivity) 25 (*)    All other components within normal limits  LIPASE, BLOOD  MAGNESIUM  TROPONIN I (HIGH SENSITIVITY)    EKG EKG Interpretation  Date/Time:  Sunday December 04 2019 20:41:27 EDT Ventricular Rate:  107 PR Interval:    QRS Duration: 86 QT Interval:  320 QTC Calculation: 427 R Axis:   -37 Text Interpretation: Sinus tachycardia Left axis deviation Anteroseptal infarct, age indeterminate Nonspecific T abnormalities, lateral leads Since last tracing rate faster Confirmed by Wandra Arthurs [phone]) on 12/04/2019 8:50:01 PM   Radiology DG Chest Port 1 View  Result Date: 12/04/2019 CLINICAL DATA:  Cough, watery diarrhea, diabetes, history of tobacco abuse EXAM: PORTABLE CHEST 1 VIEW COMPARISON:  11/25/2019 FINDINGS: Single  frontal view of the chest demonstrates an enlarged cardiac silhouette. Postsurgical changes from median sternotomy. There is vascular congestion with diffuse interstitial prominence. Patchy consolidation at the right lung base. No effusion or pneumothorax. IMPRESSION: 1. Findings consistent with mild fluid overload and interstitial edema. Electronically Signed   By: Randa Ngo M.D.   On: 12/04/2019 21:17    Procedures Procedures (including critical care time)  Medications Ordered in ED Medications  sodium chloride 0.9 % bolus 1,000 mL (1,000 mLs Intravenous New Bag/Given 12/04/19 2043)  potassium chloride SA (KLOR-CON) CR tablet 40 mEq (40 mEq Oral Given 12/04/19 2142)    ED Course  I have reviewed the triage vital signs and the nursing notes.  Pertinent labs & imaging results that were available during my care of the patient were reviewed by me and considered in my medical decision making (see chart for details).    MDM Rules/Calculators/A&P                      NIV LUISI is a 74 y.o. male here presenting with vomiting and diarrhea.  Initially seems like gastroenteritis symptoms.  Patient had CBC, CMP, lipase done and his potassium was slightly low so was supplemented.  His wife got here and she states that he has been weaker than usual and did have a fall about a week ago .  Per chart review, patient hit his left ribs.  His rib series were negative at that time.  Plan to now get trauma scan at this point given worsening weakness.  Signed out to Dr. Lolita Lenz in the ED to follow up imaging. If negative, anticipate dc home.   Final Clinical Impression(s) / ED Diagnoses Final diagnoses:  None    Rx / DC Orders ED Discharge Orders    None       Drenda Freeze, MD 12/04/19 2338

## 2019-12-04 NOTE — ED Triage Notes (Signed)
Pt arrived via EMS from home. EMS reports that pt woke up around 5:30am with N/V/D. Pt has hx of COPD. Oxygen 91 on RA. EMS reports multiple bouts of watery stool in the bed. Pt has some confusion and is hard of hearing.

## 2019-12-04 NOTE — ED Notes (Signed)
Pt. I-stat Chem 8 results calcium ion 0.81. EDP, Yao,MD. made aware.

## 2019-12-05 DIAGNOSIS — R0602 Shortness of breath: Secondary | ICD-10-CM | POA: Diagnosis not present

## 2019-12-05 DIAGNOSIS — R0902 Hypoxemia: Secondary | ICD-10-CM | POA: Diagnosis not present

## 2019-12-05 DIAGNOSIS — S3992XA Unspecified injury of lower back, initial encounter: Secondary | ICD-10-CM | POA: Diagnosis not present

## 2019-12-05 DIAGNOSIS — Z743 Need for continuous supervision: Secondary | ICD-10-CM | POA: Diagnosis not present

## 2019-12-05 DIAGNOSIS — R1110 Vomiting, unspecified: Secondary | ICD-10-CM | POA: Diagnosis not present

## 2019-12-05 DIAGNOSIS — M50323 Other cervical disc degeneration at C6-C7 level: Secondary | ICD-10-CM | POA: Diagnosis not present

## 2019-12-05 DIAGNOSIS — M2550 Pain in unspecified joint: Secondary | ICD-10-CM | POA: Diagnosis not present

## 2019-12-05 DIAGNOSIS — Z7401 Bed confinement status: Secondary | ICD-10-CM | POA: Diagnosis not present

## 2019-12-05 DIAGNOSIS — R112 Nausea with vomiting, unspecified: Secondary | ICD-10-CM | POA: Diagnosis not present

## 2019-12-05 DIAGNOSIS — R410 Disorientation, unspecified: Secondary | ICD-10-CM | POA: Diagnosis not present

## 2019-12-05 DIAGNOSIS — M47812 Spondylosis without myelopathy or radiculopathy, cervical region: Secondary | ICD-10-CM | POA: Diagnosis not present

## 2019-12-05 LAB — BRAIN NATRIURETIC PEPTIDE: B Natriuretic Peptide: 74.6 pg/mL (ref 0.0–100.0)

## 2019-12-05 MED ORDER — ONDANSETRON 4 MG PO TBDP
ORAL_TABLET | ORAL | 0 refills | Status: DC
Start: 2019-12-05 — End: 2021-04-12

## 2019-12-05 NOTE — Discharge Instructions (Addendum)
Incidental finding of a thoracic aneurysm 4.6 cm.  This will require follow up with thoracic surgery annually.  PLease schedule an appointment

## 2019-12-05 NOTE — ED Notes (Signed)
Pt given water for PO challenge; pt had no trouble with drinking water or taking PO potassium

## 2019-12-14 ENCOUNTER — Other Ambulatory Visit: Payer: Self-pay

## 2019-12-14 ENCOUNTER — Telehealth: Payer: Medicare Other | Admitting: Cardiothoracic Surgery

## 2019-12-14 ENCOUNTER — Telehealth: Payer: Self-pay | Admitting: Cardiothoracic Surgery

## 2019-12-14 NOTE — Telephone Encounter (Signed)
[address]       [place] [address]             [phone]     CARDIOTHORACIC SURGERY TELEPHONE VIRTUAL OFFICE NOTE  Referring Provider is No ref. provider found Primary Cardiologist is No primary care provider on file. PCP is Swaney, [name], MD   HPI:  I spoke with Michael Hines (DOB May 29, 1946 ) via telephone on 12/14/2019 at 2:30 PM and verified that I was speaking with the correct person using more than one form of identification.  We discussed the reason(s) for conducting our visit virtually instead of in-person.  The patient expressed understanding the circumstances and agreed to proceed as described.  The patient was last seen 3 months ago for an asymptomatic ascending fusiform aneurysm measuring 4.6 cm diameter.  At that time his diastolic blood pressure was borderline elevated and plans were made for a phone call follow-up to check on blood pressures.  2 to 3 weeks ago the patient had a fall on a stairway while carrying up some luggage.  He was evaluated at a [facility] facility for blunt injury to the left anterior chest.  CT scan of the chest was performed and I personally reviewed the images.  There is no evidence of major vascular injury.  The ascending aorta remains at 4.6 cm diameter.  There is no evidence of rib fracture or pleural effusion.  Patient states his chest wall pain has improved. The patient gave me several blood pressure readings he has performed recently 127/84 121/90 135/95 125/85.  The patient still smokes and we discussed the importance of smoking cessation for the health of his aorta as well as the durability of his bioprosthetic aortic valve.  The patient will return for a follow-up CT scan 1 year from his last study which was performed April 2021, the scan I personally reviewed.     Current Outpatient Medications  Medication Sig Dispense Refill  . aspirin EC 81 MG tablet Take 81 mg by mouth every evening.     Marland Kitchen  atorvastatin (LIPITOR) 20 MG tablet Take 20 mg by mouth at bedtime.    . budesonide-formoterol (SYMBICORT) 80-4.5 MCG/ACT inhaler Inhale 2 puffs into the lungs 2 (two) times daily.    Marland Kitchen buPROPion (WELLBUTRIN XL) 300 MG 24 hr tablet Take 300 mg by mouth daily.    . citalopram (CELEXA) 20 MG tablet Take 1 tablet (20 mg total) by mouth daily for 30 days. 30 tablet 0  . clotrimazole-betamethasone (LOTRISONE) cream Apply 1 application topically 2 (two) times daily as needed for rash.    . cyanocobalamin (,VITAMIN B-12,) 1000 MCG/ML injection Inject 1,000 mcg into the muscle every 30 (thirty) days.     Marland Kitchen gabapentin (NEURONTIN) 300 MG capsule Take 300-600 mg by mouth See admin instructions. Take 300mg  in the morning and 600mg  in the evening.    Marland Kitchen HYDROcodone-acetaminophen (NORCO) 10-325 MG tablet Take 1 tablet by mouth every 6 (six) hours as needed for pain.    Marland Kitchen ibuprofen (ADVIL,MOTRIN) 600 MG tablet Take 1 tablet (600 mg total) by mouth every 6 (six) hours as needed for moderate pain. 30 tablet 0  . loperamide (IMODIUM) 2 MG capsule Take 1 capsule (2 mg total) by mouth as needed for diarrhea or loose stools. 30 capsule 0  . LORazepam (ATIVAN) 0.5 MG tablet Take 1 tablet (0.5 mg total) by mouth at bedtime. 7 tablet 0  . ondansetron (ZOFRAN ODT) 4  MG disintegrating tablet 4mg  ODT q8 hours prn nausea/vomit 4 tablet 0  . pioglitazone (ACTOS) 15 MG tablet Take 15 mg by mouth daily.    Marland Kitchen testosterone cypionate (DEPOTESTOSTERONE CYPIONATE) 200 MG/ML injection Inject 200 mg into the muscle every 14 (fourteen) days.    Marland Kitchen tiZANidine (ZANAFLEX) 4 MG tablet Take 4-8 mg by mouth every 8 (eight) hours as needed.     No current facility-administered medications for this visit.     Diagnostic Tests:  CTA performed April 2021 personally reviewed showing stable 4.6 cm fusiform ascending aneurysm.   Impression:  Stable asymptomatic moderate fusiform thoracic aneurysm. History of AVR [facility]2 at [facility] History of smoking  Plan:  Patient will return in 1 year with CT scan to assess fusiform ascending aneurysm.    I discussed limitations of evaluation and management via telephone.  The patient was advised to call back for repeat telephone consultation or to seek an in-person evaluation if questions arise or the patient's clinical condition changes in any significant manner.  I spent in excess of 8 minutes of non-face-to-face time during the conduct of this telephone virtual office consultation.  Level 1  (99441)             5-10 minutes Level 2  (99442)            11-20 minutes Level 3  (99443)            21-30 minutes    12/14/2019 2:30 PM

## 2020-01-03 DIAGNOSIS — G5623 Lesion of ulnar nerve, bilateral upper limbs: Secondary | ICD-10-CM | POA: Diagnosis not present

## 2020-01-03 DIAGNOSIS — G5603 Carpal tunnel syndrome, bilateral upper limbs: Secondary | ICD-10-CM | POA: Diagnosis not present

## 2020-01-03 DIAGNOSIS — R2689 Other abnormalities of gait and mobility: Secondary | ICD-10-CM | POA: Diagnosis not present

## 2020-01-03 DIAGNOSIS — M5417 Radiculopathy, lumbosacral region: Secondary | ICD-10-CM | POA: Diagnosis not present

## 2020-01-03 DIAGNOSIS — G603 Idiopathic progressive neuropathy: Secondary | ICD-10-CM | POA: Diagnosis not present

## 2020-01-05 ENCOUNTER — Other Ambulatory Visit: Payer: Self-pay | Admitting: Specialist

## 2020-01-05 DIAGNOSIS — G250 Essential tremor: Secondary | ICD-10-CM

## 2020-01-05 DIAGNOSIS — G603 Idiopathic progressive neuropathy: Secondary | ICD-10-CM

## 2020-01-19 DIAGNOSIS — E538 Deficiency of other specified B group vitamins: Secondary | ICD-10-CM | POA: Diagnosis not present

## 2020-01-26 DIAGNOSIS — G4733 Obstructive sleep apnea (adult) (pediatric): Secondary | ICD-10-CM | POA: Diagnosis not present

## 2020-01-26 DIAGNOSIS — M545 Low back pain: Secondary | ICD-10-CM | POA: Diagnosis not present

## 2020-01-26 DIAGNOSIS — G603 Idiopathic progressive neuropathy: Secondary | ICD-10-CM | POA: Diagnosis not present

## 2020-01-26 DIAGNOSIS — G250 Essential tremor: Secondary | ICD-10-CM | POA: Diagnosis not present

## 2020-01-26 DIAGNOSIS — G20 Parkinson's disease: Secondary | ICD-10-CM | POA: Diagnosis not present

## 2020-01-30 DIAGNOSIS — I359 Nonrheumatic aortic valve disorder, unspecified: Secondary | ICD-10-CM | POA: Diagnosis not present

## 2020-01-30 DIAGNOSIS — I712 Thoracic aortic aneurysm, without rupture: Secondary | ICD-10-CM | POA: Diagnosis not present

## 2020-01-30 DIAGNOSIS — Z952 Presence of prosthetic heart valve: Secondary | ICD-10-CM | POA: Diagnosis not present

## 2020-02-06 ENCOUNTER — Other Ambulatory Visit: Payer: Self-pay

## 2020-02-06 ENCOUNTER — Ambulatory Visit
Admission: RE | Admit: 2020-02-06 | Discharge: 2020-02-06 | Disposition: A | Discharge status: Home / Self Care | Payer: Medicare Other | Source: Ambulatory Visit | Attending: Specialist | Admitting: Specialist

## 2020-02-06 DIAGNOSIS — G603 Idiopathic progressive neuropathy: Secondary | ICD-10-CM

## 2020-02-06 DIAGNOSIS — G250 Essential tremor: Secondary | ICD-10-CM

## 2020-02-06 DIAGNOSIS — R251 Tremor, unspecified: Secondary | ICD-10-CM | POA: Diagnosis not present
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
## 2020-02-23 DIAGNOSIS — E538 Deficiency of other specified B group vitamins: Secondary | ICD-10-CM | POA: Diagnosis not present

## 2020-03-05 DIAGNOSIS — I517 Cardiomegaly: Secondary | ICD-10-CM | POA: Diagnosis not present

## 2020-03-05 DIAGNOSIS — Z79899 Other long term (current) drug therapy: Secondary | ICD-10-CM | POA: Diagnosis not present

## 2020-03-05 DIAGNOSIS — S0993XA Unspecified injury of face, initial encounter: Secondary | ICD-10-CM | POA: Diagnosis not present

## 2020-03-05 DIAGNOSIS — I313 Pericardial effusion (noninflammatory): Secondary | ICD-10-CM | POA: Diagnosis not present

## 2020-03-05 DIAGNOSIS — S2241XA Multiple fractures of ribs, right side, initial encounter for closed fracture: Secondary | ICD-10-CM | POA: Diagnosis not present

## 2020-03-05 DIAGNOSIS — R0989 Other specified symptoms and signs involving the circulatory and respiratory systems: Secondary | ICD-10-CM | POA: Diagnosis not present

## 2020-03-05 DIAGNOSIS — S5011XA Contusion of right forearm, initial encounter: Secondary | ICD-10-CM | POA: Diagnosis not present

## 2020-03-05 DIAGNOSIS — Z952 Presence of prosthetic heart valve: Secondary | ICD-10-CM | POA: Diagnosis not present

## 2020-03-05 DIAGNOSIS — S299XXA Unspecified injury of thorax, initial encounter: Secondary | ICD-10-CM | POA: Diagnosis not present

## 2020-03-05 DIAGNOSIS — M25561 Pain in right knee: Secondary | ICD-10-CM | POA: Diagnosis not present

## 2020-03-05 DIAGNOSIS — S59911A Unspecified injury of right forearm, initial encounter: Secondary | ICD-10-CM | POA: Diagnosis not present

## 2020-03-05 DIAGNOSIS — Z7982 Long term (current) use of aspirin: Secondary | ICD-10-CM | POA: Diagnosis not present

## 2020-03-05 DIAGNOSIS — S0031XA Abrasion of nose, initial encounter: Secondary | ICD-10-CM | POA: Diagnosis not present

## 2020-03-05 DIAGNOSIS — S8001XA Contusion of right knee, initial encounter: Secondary | ICD-10-CM | POA: Diagnosis not present

## 2020-03-12 DIAGNOSIS — E7800 Pure hypercholesterolemia, unspecified: Secondary | ICD-10-CM | POA: Diagnosis not present

## 2020-03-12 DIAGNOSIS — E1169 Type 2 diabetes mellitus with other specified complication: Secondary | ICD-10-CM | POA: Diagnosis not present

## 2020-03-12 DIAGNOSIS — J439 Emphysema, unspecified: Secondary | ICD-10-CM | POA: Diagnosis not present

## 2020-03-12 DIAGNOSIS — R5383 Other fatigue: Secondary | ICD-10-CM | POA: Diagnosis not present

## 2020-03-12 DIAGNOSIS — E538 Deficiency of other specified B group vitamins: Secondary | ICD-10-CM | POA: Diagnosis not present

## 2020-03-12 DIAGNOSIS — Z0000 Encounter for general adult medical examination without abnormal findings: Secondary | ICD-10-CM | POA: Diagnosis not present

## 2020-03-23 DIAGNOSIS — Z8616 Personal history of COVID-19: Secondary | ICD-10-CM | POA: Diagnosis not present

## 2020-03-23 DIAGNOSIS — D72829 Elevated white blood cell count, unspecified: Secondary | ICD-10-CM | POA: Diagnosis not present

## 2020-03-23 DIAGNOSIS — E538 Deficiency of other specified B group vitamins: Secondary | ICD-10-CM | POA: Diagnosis not present

## 2020-04-05 DIAGNOSIS — Z79899 Other long term (current) drug therapy: Secondary | ICD-10-CM | POA: Diagnosis not present

## 2020-04-05 DIAGNOSIS — G603 Idiopathic progressive neuropathy: Secondary | ICD-10-CM | POA: Diagnosis not present

## 2020-04-05 DIAGNOSIS — G20 Parkinson's disease: Secondary | ICD-10-CM | POA: Diagnosis not present

## 2020-04-05 DIAGNOSIS — R2689 Other abnormalities of gait and mobility: Secondary | ICD-10-CM | POA: Diagnosis not present

## 2020-04-05 DIAGNOSIS — M545 Low back pain: Secondary | ICD-10-CM | POA: Diagnosis not present

## 2020-04-05 DIAGNOSIS — M542 Cervicalgia: Secondary | ICD-10-CM | POA: Diagnosis not present

## 2020-04-06 DIAGNOSIS — H53001 Unspecified amblyopia, right eye: Secondary | ICD-10-CM | POA: Diagnosis not present

## 2020-04-06 DIAGNOSIS — H2513 Age-related nuclear cataract, bilateral: Secondary | ICD-10-CM | POA: Diagnosis not present

## 2020-04-06 DIAGNOSIS — Z7984 Long term (current) use of oral hypoglycemic drugs: Secondary | ICD-10-CM | POA: Diagnosis not present

## 2020-04-06 DIAGNOSIS — E1136 Type 2 diabetes mellitus with diabetic cataract: Secondary | ICD-10-CM | POA: Diagnosis not present

## 2020-04-06 DIAGNOSIS — D23122 Other benign neoplasm of skin of left lower eyelid, including canthus: Secondary | ICD-10-CM | POA: Diagnosis not present

## 2020-05-07 DIAGNOSIS — E538 Deficiency of other specified B group vitamins: Secondary | ICD-10-CM | POA: Diagnosis not present

## 2020-06-08 DIAGNOSIS — E538 Deficiency of other specified B group vitamins: Secondary | ICD-10-CM | POA: Diagnosis not present

## 2020-06-23 ENCOUNTER — Ambulatory Visit: Payer: Medicare Other

## 2020-06-28 DIAGNOSIS — G20 Parkinson's disease: Secondary | ICD-10-CM | POA: Diagnosis not present

## 2020-06-28 DIAGNOSIS — M5441 Lumbago with sciatica, right side: Secondary | ICD-10-CM | POA: Diagnosis not present

## 2020-06-28 DIAGNOSIS — G603 Idiopathic progressive neuropathy: Secondary | ICD-10-CM | POA: Diagnosis not present

## 2020-06-28 DIAGNOSIS — M5442 Lumbago with sciatica, left side: Secondary | ICD-10-CM | POA: Diagnosis not present

## 2020-06-28 DIAGNOSIS — Z79899 Other long term (current) drug therapy: Secondary | ICD-10-CM | POA: Diagnosis not present

## 2020-07-16 DIAGNOSIS — E538 Deficiency of other specified B group vitamins: Secondary | ICD-10-CM | POA: Diagnosis not present

## 2020-07-26 DIAGNOSIS — E1169 Type 2 diabetes mellitus with other specified complication: Secondary | ICD-10-CM | POA: Diagnosis not present

## 2020-07-26 DIAGNOSIS — J439 Emphysema, unspecified: Secondary | ICD-10-CM | POA: Diagnosis not present

## 2020-07-26 DIAGNOSIS — E7800 Pure hypercholesterolemia, unspecified: Secondary | ICD-10-CM | POA: Diagnosis not present

## 2020-08-02 DIAGNOSIS — E1169 Type 2 diabetes mellitus with other specified complication: Secondary | ICD-10-CM | POA: Diagnosis not present

## 2020-08-02 DIAGNOSIS — E7800 Pure hypercholesterolemia, unspecified: Secondary | ICD-10-CM | POA: Diagnosis not present

## 2020-08-28 ENCOUNTER — Other Ambulatory Visit: Payer: Self-pay | Admitting: Thoracic Surgery (Cardiothoracic Vascular Surgery)

## 2020-08-28 DIAGNOSIS — I712 Thoracic aortic aneurysm, without rupture, unspecified: Secondary | ICD-10-CM

## 2020-09-28 ENCOUNTER — Encounter: Payer: Self-pay | Admitting: Thoracic Surgery (Cardiothoracic Vascular Surgery)

## 2020-09-28 ENCOUNTER — Ambulatory Visit
Admission: RE | Admit: 2020-09-28 | Discharge: 2020-09-28 | Disposition: A | Discharge status: Home / Self Care | Payer: Medicare Other | Source: Ambulatory Visit | Attending: Thoracic Surgery (Cardiothoracic Vascular Surgery) | Admitting: Thoracic Surgery (Cardiothoracic Vascular Surgery)

## 2020-09-28 ENCOUNTER — Ambulatory Visit: Payer: Medicare Other | Admitting: Thoracic Surgery (Cardiothoracic Vascular Surgery)

## 2020-09-28 ENCOUNTER — Other Ambulatory Visit: Payer: Self-pay

## 2020-09-28 VITALS — BP 124/84 | HR 101 | Resp 20 | Wt 215.0 lb

## 2020-09-28 DIAGNOSIS — I712 Thoracic aortic aneurysm, without rupture, unspecified: Secondary | ICD-10-CM

## 2020-09-28 IMAGING — CT CT ANGIO CHEST
2 of 6 series · 13 of 36 positions shown · IV contrast (iopamidol)
Comparison: [DATE]

CLINICAL DATA: ISMELDA follow-up

EXAM:
CT ANGIOGRAPHY CHEST WITH CONTRAST
TECHNIQUE: Multidetector CT imaging of the chest was performed using the
standard protocol during bolus administration of intravenous
contrast. Multiplanar CT image reconstructions and MIPs were
obtained to evaluate the vascular anatomy.
CONTRAST:  75mL [AI] IOPAMIDOL ([AI]) INJECTION 76%

[Series 5: cta thorax 2.00 bv36 s3 axial arterial · axial · arterial · 0.83mm/px · z∈[+1539,+1805]mm · 12 of 159 slices shown]
[im 13/159  lung]
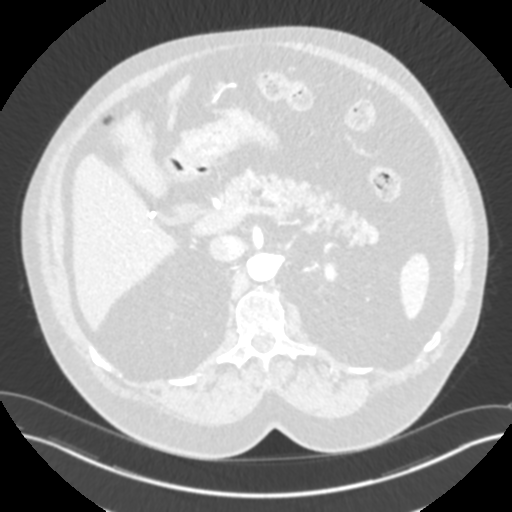
[im 25/159  mediastinal]
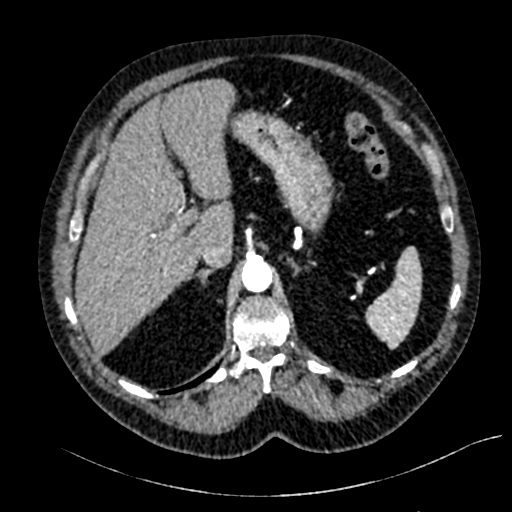
[im 37/159  lung]
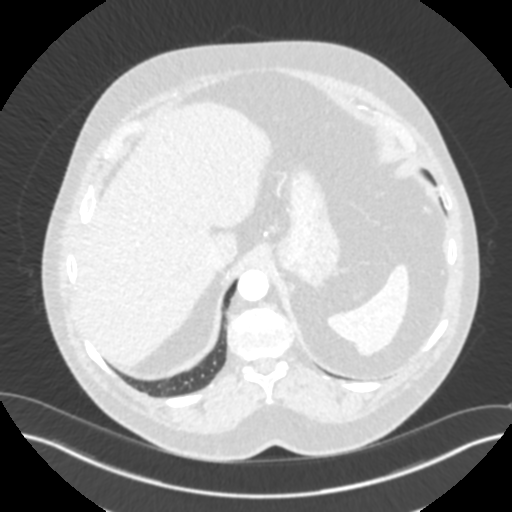
[im 49/159  mediastinal]
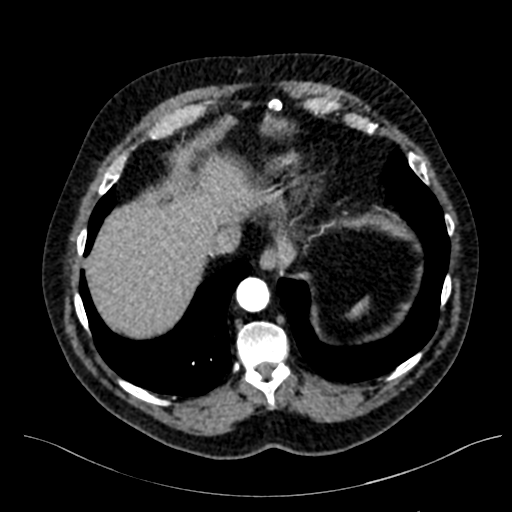
[im 61/159  lung]
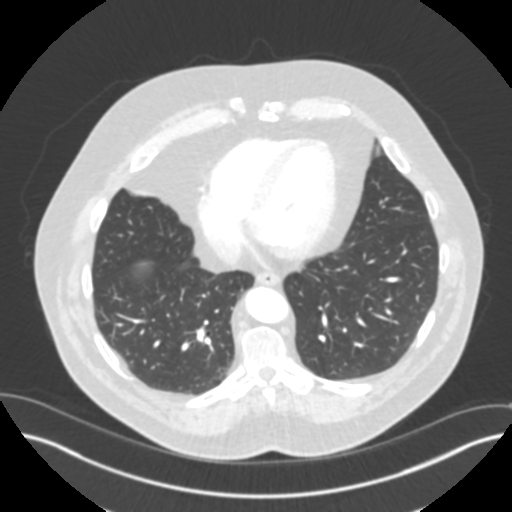
[im 73/159  mediastinal]
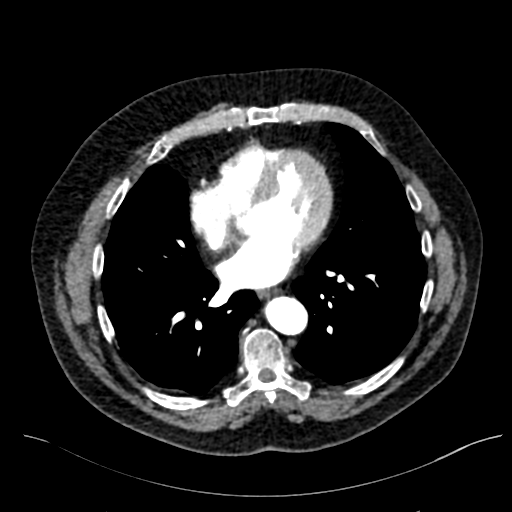
[im 86/159  lung]
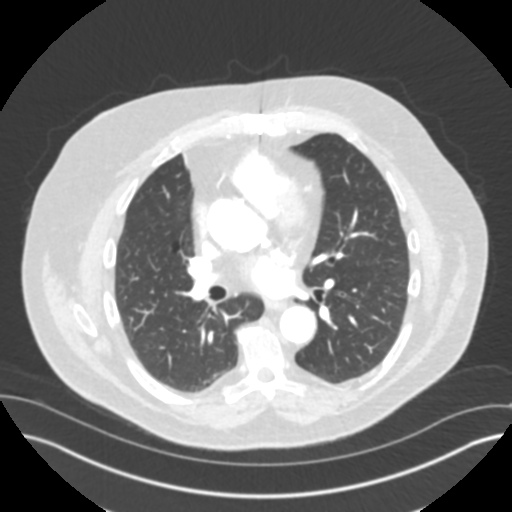
[im 98/159  mediastinal]
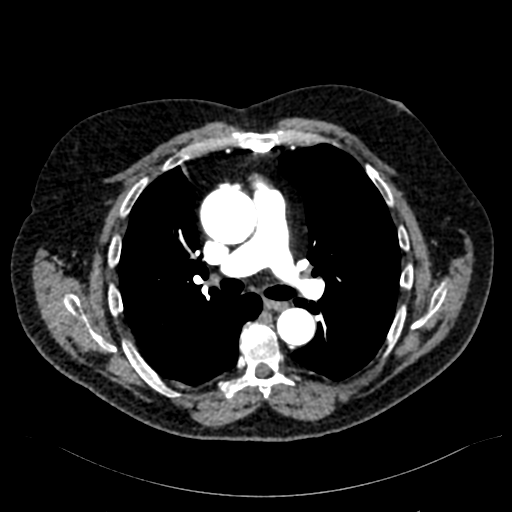
[im 110/159  lung]
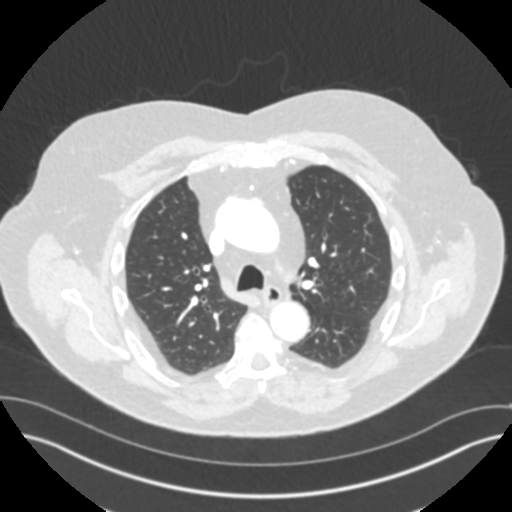
[im 122/159  mediastinal]
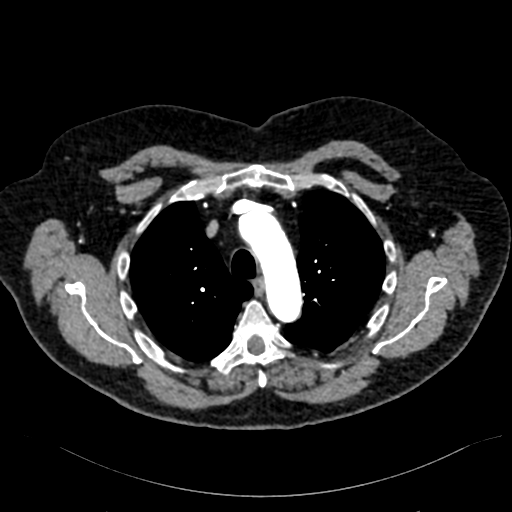
[im 134/159  lung]
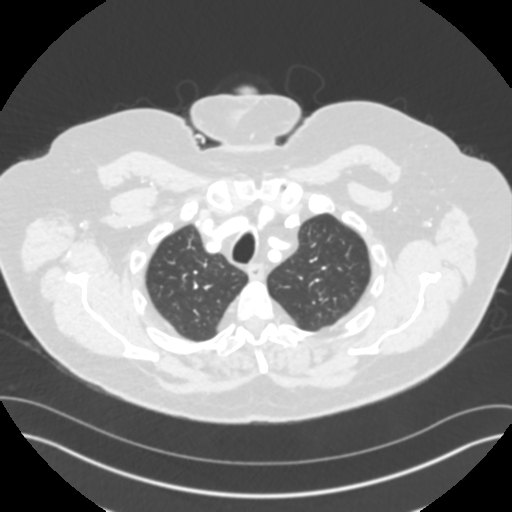
[im 146/159  mediastinal]
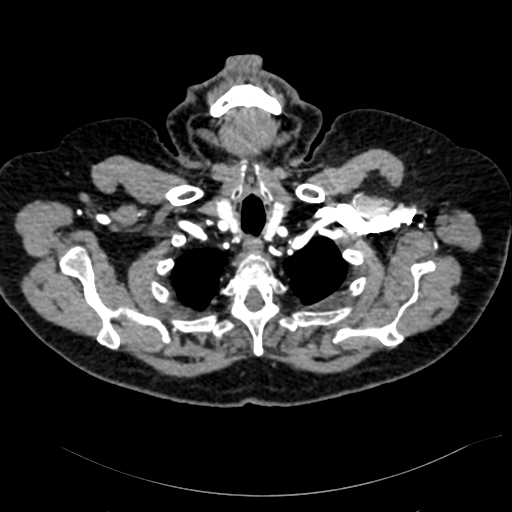

[Series 10: cta thorax 2.00 bv36 s3 cor st · coronal · 0.62mm/px · 1 of 203 slices shown]
[im 102/203  mediastinal]
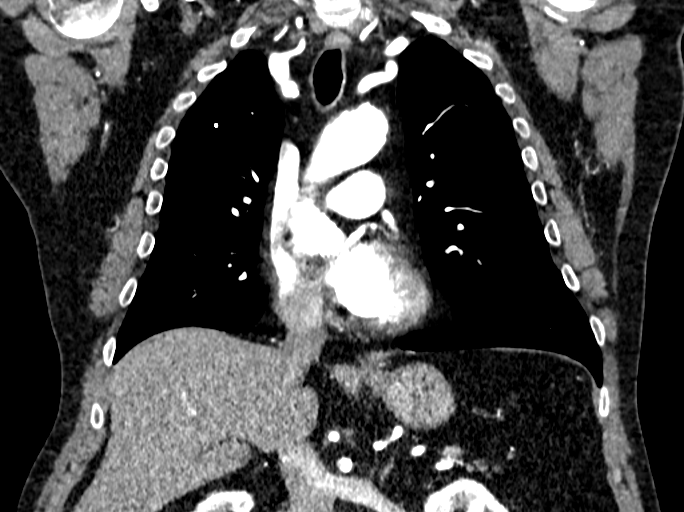

[13 of 36 positions shown; findings below may reference images not displayed]

FINDINGS: Cardiovascular: Status post aortic valve replacement.
Revisualization of aneurysmal dilation of the ascending thoracic
aorta to 4.6 cm, unchanged in comparison to prior. Atherosclerotic
calcifications throughout the aorta, mild-to-moderate. LEFT-sided
coronary artery atherosclerotic calcifications.

Mediastinum/Nodes: Thyroid is unremarkable. No axillary or
mediastinal adenopathy.

Lungs/Pleura: Central airway is patent. Scattered calcified
pulmonary nodules consistent with the sequela of remote prior
granulomatous infection. RIGHT middle lobe atelectasis. There is
minimal centrilobular emphysema. Noncalcified LEFT upper lobe
pulmonary nodule measures 2 mm, unchanged (series 7, image 25).

Upper Abdomen: No acute abnormality.

Musculoskeletal: Status post median sternotomy. Mild degenerative
changes of the thoracic spine. Remote RIGHT-sided seventh through
ninth rib fractures.

Review of the MIP images confirms the above findings.
IMPRESSION: 1. Stable aneurysmal dilation of the ascending thoracic aorta to
cm status post aortic valve repair. Ascending thoracic aortic
aneurysm. Recommend semi-annual imaging followup by CTA or MRA and
referral to cardiothoracic surgery if not already obtained. This
recommendation follows [AI]
ACCF/AHA/AATS/ACR/ASA/SCA/ISMELDA/ISMELDA/ISMELDA/ISMELDA Guidelines for the
Diagnosis and Management of Patients With Thoracic Aortic Disease.
Circulation. [AI]; 121: E266-e369. Aortic aneurysm NOS ([AI]-[AI])

Aortic Atherosclerosis ([AI]-[AI]) .

## 2020-09-28 MED ORDER — IOPAMIDOL (ISOVUE-370) INJECTION 76%
75.0000 mL | Freq: Once | INTRAVENOUS | Status: AC | PRN
  Administered 2020-09-28: 75 mL via INTRAVENOUS

## 2020-09-28 NOTE — Progress Notes (Signed)
[address]       [place] [address]             [place] Record #[id number] Date of Birth: Nov 11, 1945  Referring: Anda Kraft, MD Primary Care: Finis Bud, MD Primary Cardiologist: No primary care provider on file.  Chief Complaint:    Chief Complaint  Patient presents with  . Thoracic Aortic Aneurysm    Yearly f/u with CTA chest today    History of Present Illness:    Michael Hines 75 y.o. male presents for 1 year follow-up for a 4.6 cm ascending aortic aneurysm.  He has a history of aortic valve replacement x2.  He continues to smoke cigarettes.  He denies any chest pain or shortness of breath.    Past Medical History:  Diagnosis Date  . Anxiety   . B12 deficiency   . Back pain   . Diabetes mellitus without complication ([place])   . Hyperlipidemia   . Testosterone insufficiency     Past Surgical History:  Procedure Laterality Date  . AORTIC VALVE REPLACEMENT  2013   redo bioprosthetic valve at [facility], Dr Mauricio Po  . CHOLECYSTECTOMY    . STRABISMUS SURGERY    . TEE WITHOUT CARDIOVERSION N/A 09/07/2018   Procedure: TRANSESOPHAGEAL ECHOCARDIOGRAM (TEE);  Surgeon: Acie Fredrickson Wonda Cheng, MD;  Location: [facility] ENDOSCOPY;  Service: Cardiovascular;  Laterality: N/A;    Family History  Problem Relation Age of Onset  . Hypertension Mother   . Diabetes Mother      Social History   Tobacco Use  Smoking Status Former Smoker  . Packs/day: 1.50  . Years: 55.00  . Pack years: 82.50  . Types: Cigarettes  . Quit date: 08/24/2018  . Years since quitting: 2.0  Smokeless Tobacco Former Systems developer    Social History   Substance and Sexual Activity  Alcohol Use Not Currently     No Known Allergies  Current Outpatient Medications  Medication Sig Dispense Refill  . aspirin EC 81 MG tablet Take 325 mg by mouth every evening.    Marland Kitchen atorvastatin (LIPITOR) 20 MG tablet Take 20 mg by mouth at  bedtime.    . budesonide-formoterol (SYMBICORT) 80-4.5 MCG/ACT inhaler Inhale 2 puffs into the lungs 2 (two) times daily.    Marland Kitchen buPROPion (WELLBUTRIN XL) 300 MG 24 hr tablet Take 300 mg by mouth daily.    . clotrimazole-betamethasone (LOTRISONE) cream Apply 1 application topically 2 (two) times daily as needed for rash.    . cyanocobalamin (,VITAMIN B-12,) 1000 MCG/ML injection Inject 1,000 mcg into the muscle every 30 (thirty) days.     Marland Kitchen gabapentin (NEURONTIN) 300 MG capsule Take 300-600 mg by mouth See admin instructions. Take 300mg  in the morning and 600mg  in the evening.    Marland Kitchen HYDROcodone-acetaminophen (NORCO) 10-325 MG tablet Take 1 tablet by mouth every 6 (six) hours as needed for pain.    Marland Kitchen ibuprofen (ADVIL,MOTRIN) 600 MG tablet Take 1 tablet (600 mg total) by mouth every 6 (six) hours as needed for moderate pain. 30 tablet 0  . loperamide (IMODIUM) 2 MG capsule Take 1 capsule (2 mg total) by mouth as needed for diarrhea or loose stools. 30 capsule 0  . LORazepam (ATIVAN) 0.5 MG tablet Take 1 tablet (0.5 mg total) by mouth at bedtime. 7 tablet 0  .  ondansetron (ZOFRAN ODT) 4 MG disintegrating tablet 4mg  ODT q8 hours prn nausea/vomit 4 tablet 0  . pioglitazone (ACTOS) 15 MG tablet Take 15 mg by mouth daily.    Marland Kitchen testosterone cypionate (DEPOTESTOSTERONE CYPIONATE) 200 MG/ML injection Inject 200 mg into the muscle every 14 (fourteen) days.    Marland Kitchen tiZANidine (ZANAFLEX) 4 MG tablet Take 4-8 mg by mouth every 8 (eight) hours as needed.    . citalopram (CELEXA) 20 MG tablet Take 1 tablet (20 mg total) by mouth daily for 30 days. 30 tablet 0   No current facility-administered medications for this visit.    Review of Systems  All other systems reviewed and are negative.   PHYSICAL EXAMINATION: BP 124/84 (BP Location: Left Arm, Patient Position: Sitting)   Pulse (!) 101   Resp 20   Wt 215 lb (97.5 kg)   SpO2 95% Comment: RA  BMI 31.75 kg/m   Physical Exam Constitutional:      Appearance:  Normal appearance.  Cardiovascular:     Rate and Rhythm: Tachycardia present.  Musculoskeletal:        General: Normal range of motion.     Cervical back: Normal range of motion.  Neurological:     General: No focal deficit present.     Mental Status: He is alert and oriented to person, place, and time.      Diagnostic Studies & Laboratory data:     Recent Radiology Findings:   CT ANGIO CHEST AORTA W/CM & OR WO/CM  Result Date: 09/28/2020 CLINICAL DATA:  TAA follow-up EXAM: CT ANGIOGRAPHY CHEST WITH CONTRAST TECHNIQUE: Multidetector CT imaging of the chest was performed using the standard protocol during bolus administration of intravenous contrast. Multiplanar CT image reconstructions and MIPs were obtained to evaluate the vascular anatomy. CONTRAST:  63mL ISOVUE-370 IOPAMIDOL (ISOVUE-370) INJECTION 76% COMPARISON:  December 04, 2019 FINDINGS: Cardiovascular: Status post aortic valve replacement. Revisualization of aneurysmal dilation of the ascending thoracic aorta to 4.6 cm, unchanged in comparison to prior. Atherosclerotic calcifications throughout the aorta, mild-to-moderate. LEFT-sided coronary artery atherosclerotic calcifications. Mediastinum/Nodes: Thyroid is unremarkable. No axillary or mediastinal adenopathy. Lungs/Pleura: Central airway is patent. Scattered calcified pulmonary nodules consistent with the sequela of remote prior granulomatous infection. RIGHT middle lobe atelectasis. There is minimal centrilobular emphysema. Noncalcified LEFT upper lobe pulmonary nodule measures 2 mm, unchanged (series 7, image 25). Upper Abdomen: No acute abnormality. Musculoskeletal: Status post median sternotomy. Mild degenerative changes of the thoracic spine. Remote RIGHT-sided seventh through ninth rib fractures. Review of the MIP images confirms the above findings. IMPRESSION: 1. Stable aneurysmal dilation of the ascending thoracic aorta to 4.6 cm status post aortic valve repair. Ascending thoracic  aortic aneurysm. Recommend semi-annual imaging followup by CTA or MRA and referral to cardiothoracic surgery if not already obtained. This recommendation follows 2010 ACCF/AHA/AATS/ACR/ASA/SCA/SCAI/SIR/STS/SVM Guidelines for the Diagnosis and Management of Patients With Thoracic Aortic Disease. Circulation. 2010; 121: K481-E563. Aortic aneurysm NOS (ICD10-I71.9) Aortic Atherosclerosis (ICD10-I70.0) . Electronically Signed   By: Valentino Saxon MD   On: 09/28/2020 11:56       I have independently reviewed the above radiology studies  and reviewed the findings with the patient.   Recent Lab Findings: Lab Results  Component Value Date   WBC 11.9 (H) 12/04/2019   HGB 17.3 (H) 12/04/2019   HCT 51.0 12/04/2019   PLT 135 (L) 12/04/2019   GLUCOSE 96 12/04/2019   ALT 31 12/04/2019   AST 26 12/04/2019   NA 140 12/04/2019   K 3.0 (  L) 12/04/2019   CL 105 12/04/2019   CREATININE 0.70 12/04/2019   BUN 13 12/04/2019   CO2 22 12/04/2019   TSH 1.068 09/10/2018   INR 1.08 09/01/2018   HGBA1C 7.4 (H) 09/04/2018        Assessment / Plan:   75 year old male previously followed by Dr. Darcey Nora for a 4.6 cm ascending aortic aneurysm.  I personally reviewed the CT scan and discussed the results with the patient.  His aneurysm is essentially stable.  We discussed the importance of smoking cessation as well as tight blood pressure control to ensure that there is no interval growth in his aneurysm.  I expressed good understanding of this.  Warning signs for aortic dissection were also covered and he is aware when to present to the emergency department.  He will follow up again in 1 year with another CTA is a virtual visit.      Lajuana Matte 09/28/2020 3:43 PM

## 2020-11-01 ENCOUNTER — Other Ambulatory Visit: Payer: Self-pay | Admitting: *Deleted

## 2021-03-22 ENCOUNTER — Other Ambulatory Visit: Payer: Self-pay | Admitting: Specialist

## 2021-03-22 DIAGNOSIS — M5416 Radiculopathy, lumbar region: Secondary | ICD-10-CM

## 2021-04-06 ENCOUNTER — Ambulatory Visit
Admission: RE | Admit: 2021-04-06 | Discharge: 2021-04-06 | Disposition: A | Discharge status: Home / Self Care | Payer: Medicare Other | Source: Ambulatory Visit | Attending: Specialist | Admitting: Specialist

## 2021-04-06 ENCOUNTER — Other Ambulatory Visit: Payer: Self-pay

## 2021-04-06 DIAGNOSIS — M5416 Radiculopathy, lumbar region: Secondary | ICD-10-CM

## 2021-04-06 IMAGING — MR MR LUMBAR SPINE W/O CM
4 of 5 series · 27 of 48 positions shown · non-contrast
Comparison: MRI scratched at CT of the lumbar spine without
contrast [DATE]

CLINICAL DATA: Low back pain extending into the buttocks and groin
bilaterally. Body weakness. Regular epidural spine injections.

EXAM:
MRI LUMBAR SPINE WITHOUT CONTRAST
TECHNIQUE: Multiplanar, multisequence MR imaging of the lumbar spine was
performed. No intravenous contrast was administered.

[Series 3: T2 · sagittal · 4.0mm · 0.53mm/px · 9 of 16 slices shown (1 of 2)]
[im 1/16]
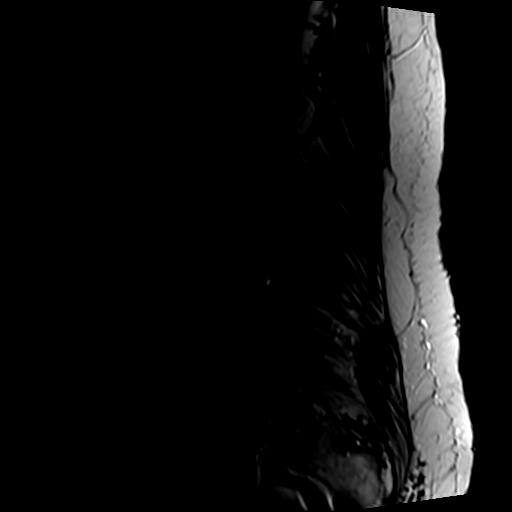
[im 2/16]
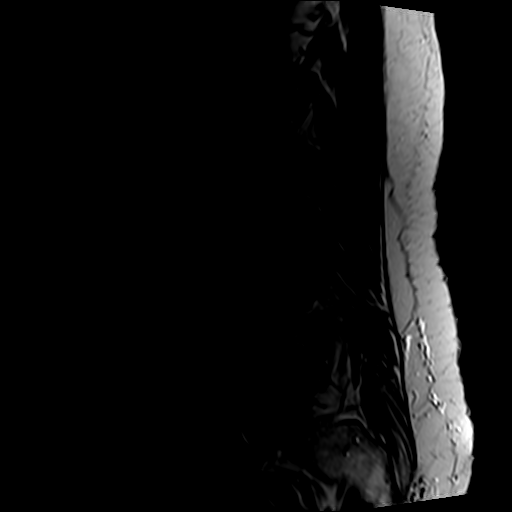
[im 4/16]
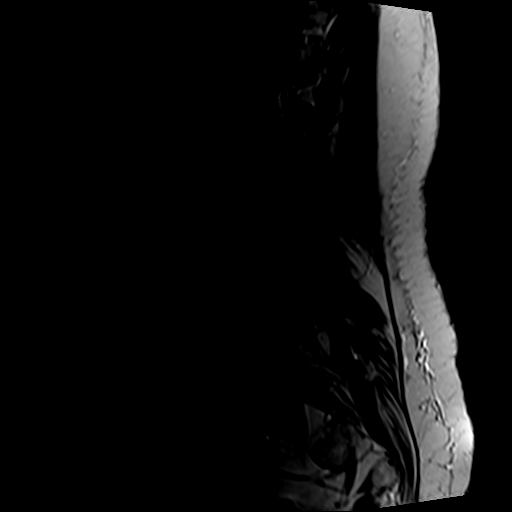
[im 6/16]
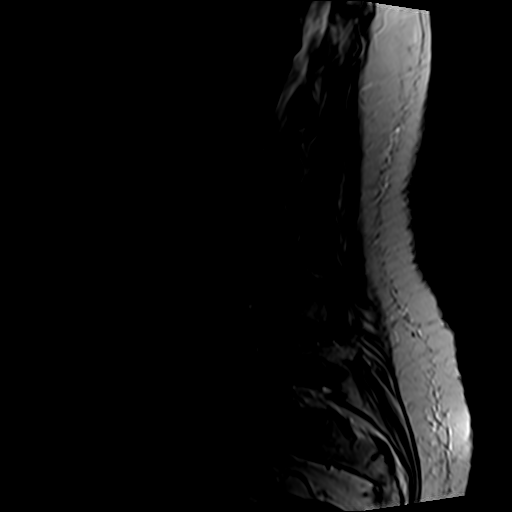
[im 8/16]
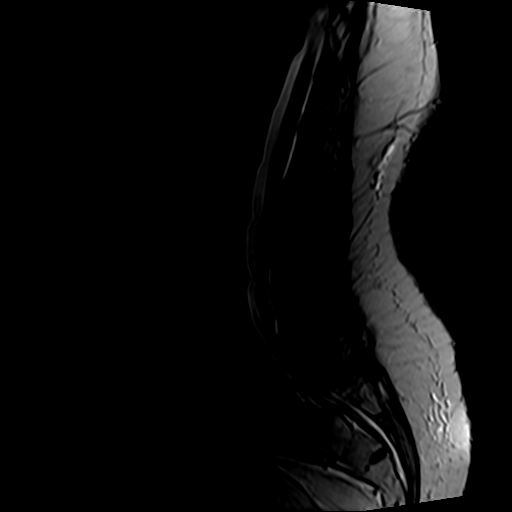
[im 10/16]
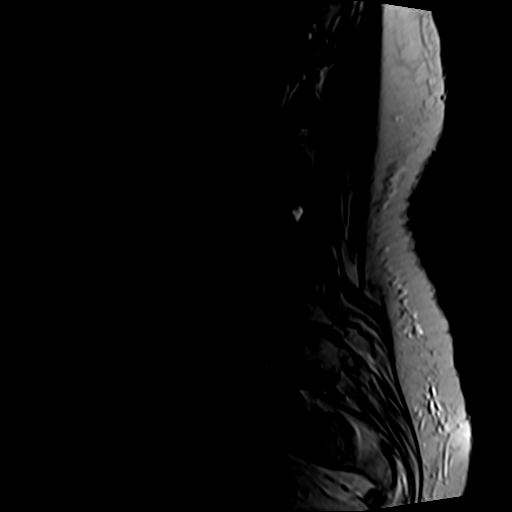
[im 12/16]
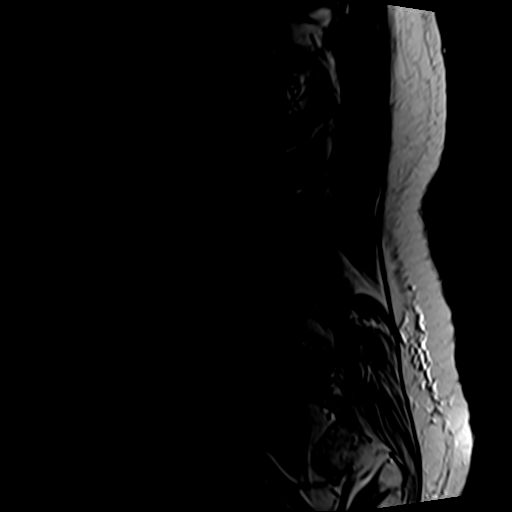
[im 14/16]
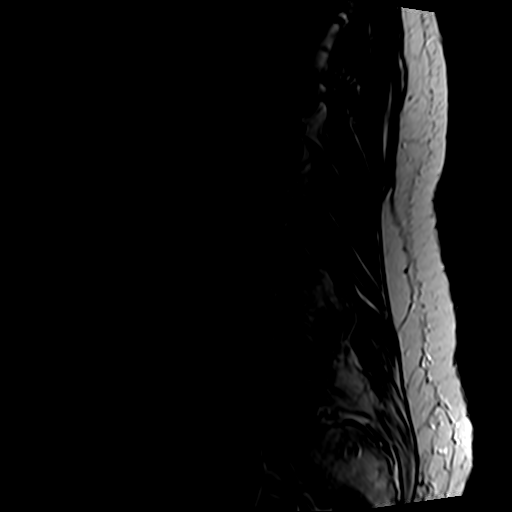
[im 16/16]
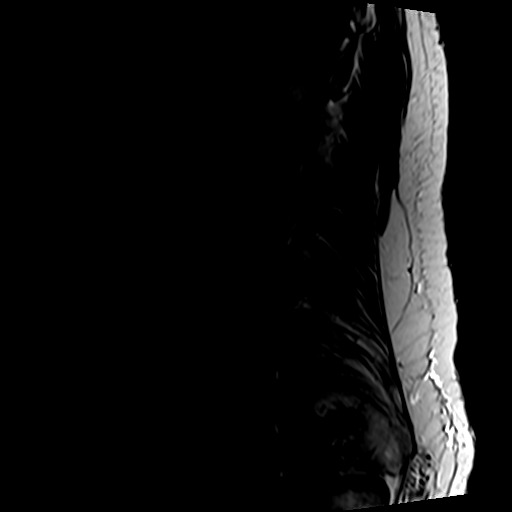

[Series 5: T1 · sagittal · 4.0mm · 0.53mm/px · 4 of 16 slices shown (1 of 2)]
[im 1/16]
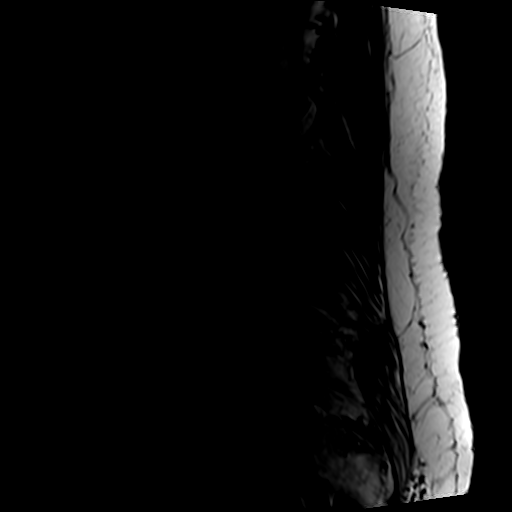
[im 3/16]
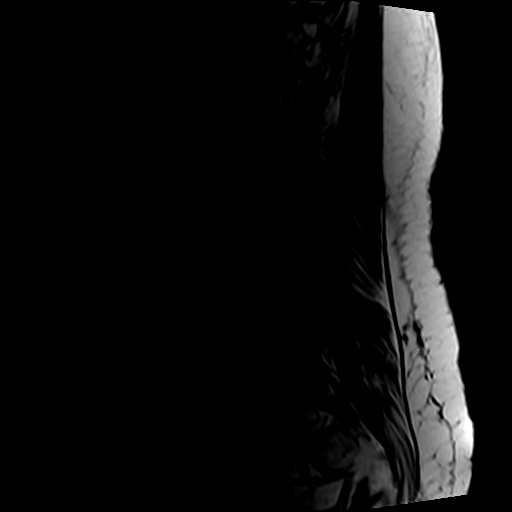
[im 9/16]
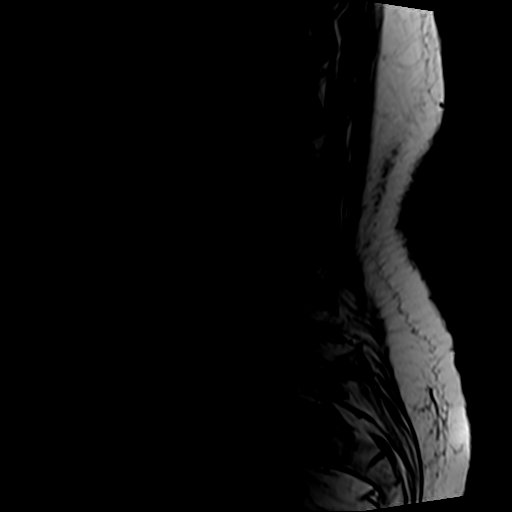
[im 13/16]
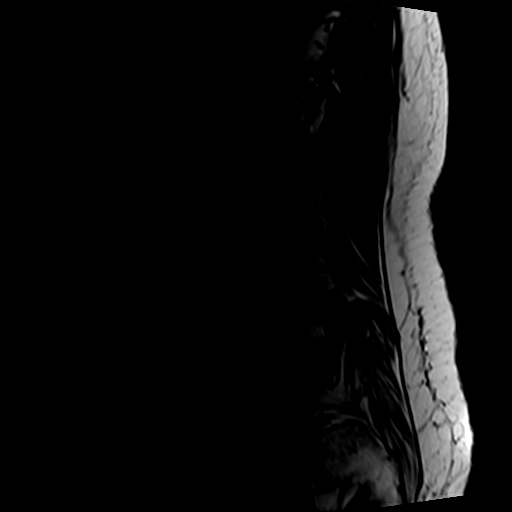

[Series 6: T2 · axial · 4.0mm · 0.70mm/px · z∈[-31,+186]mm · 11 of 22 slices shown (2 of 2)]
[im 1/22]
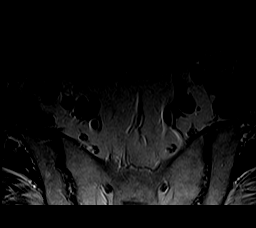
[im 3/22]
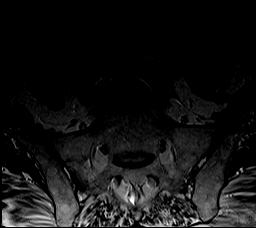
[im 5/22]
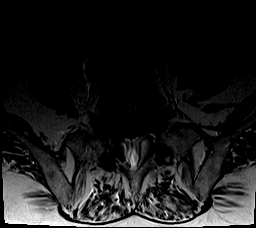
[im 7/22]
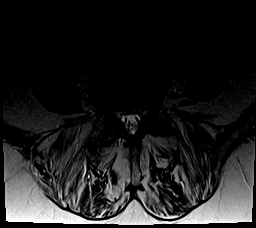
[im 9/22]
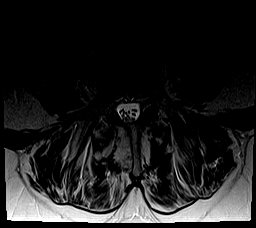
[im 11/22]
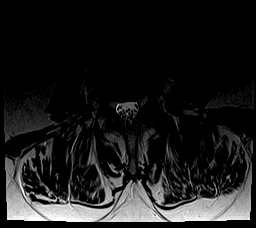
[im 13/22]
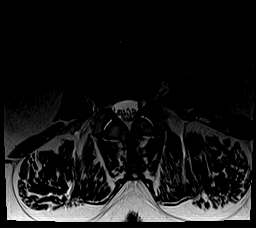
[im 15/22]
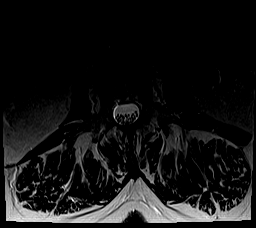
[im 17/22]
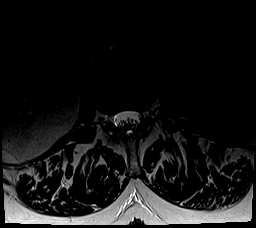
[im 19/22]
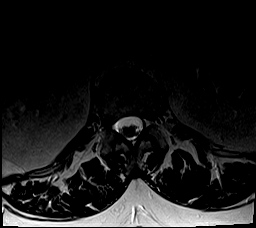
[im 22/22]
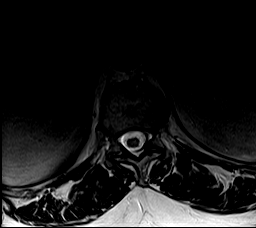

[Series 7: T1 · axial · 4.0mm · 0.35mm/px · z∈[-10,+155]mm · 3 of 22 slices shown (2 of 2)]
[im 3/22]
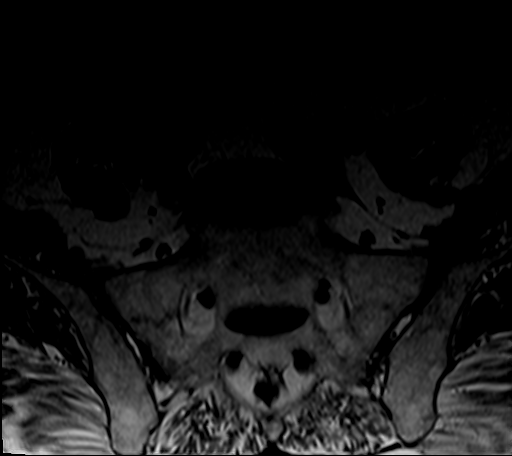
[im 11/22]
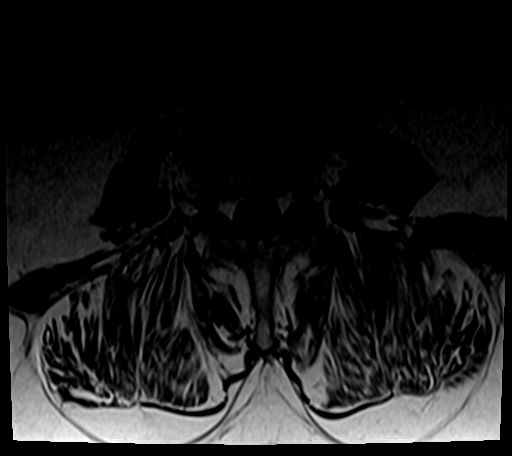
[im 19/22]
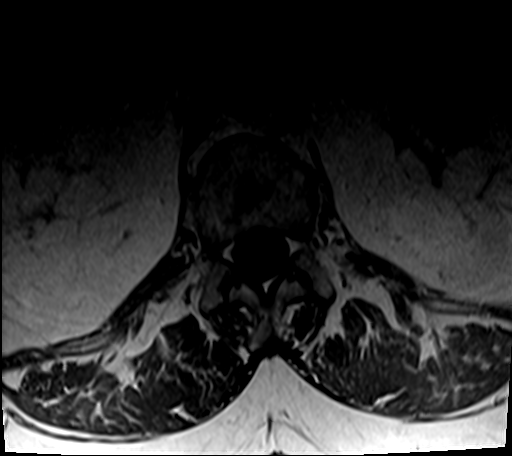

[27 of 48 positions shown; findings below may reference images not displayed]

FINDINGS: Segmentation: 5 non rib-bearing lumbar type vertebral bodies are
present. The lowest fully formed vertebral body is L5.

Alignment: Grade 1 anterolisthesis at L5-S1 measures 5 mm. No other
significant listhesis is present. Lumbar lordosis is preserved.

Vertebrae:  Marrow signal and vertebral body heights are normal.

Conus medullaris and cauda equina: Conus extends to the L2 level.
Conus and cauda equina appear normal.

Paraspinal and other soft tissues: Limited imaging the abdomen is
unremarkable. There is no significant adenopathy. No solid organ
lesions are present.

Disc levels:

T12-L1: Negative.

L1-2: Mild disc bulging and facet hypertrophy present. No
significant stenosis is present.

L2-3: Rightward disc bulge is present. No significant stenosis is
present.

L3-4: A broad-based disc protrusion is present. Mild facet
hypertrophy noted bilaterally. Central canal is patent. Moderate
right mild left foraminal narrowing is present.

L4-5: Broad-based disc protrusion is present. Moderate facet
hypertrophy noted. Central canal is patent. Moderate foraminal
narrowing is worse on the right.

L5-S1: There is uncovering of broad-based disc protrusion. Advanced
facet hypertrophy is noted bilaterally. Central canal is patent.
Moderate foraminal narrowing is present bilaterally, left greater
than right.
IMPRESSION: 1. Multilevel spondylosis of the lumbar spine as described.
2. 5 mm grade 1 anterolisthesis at L5-S1 with advanced bilateral
facet hypertrophy and uncovering of a broad-based disc protrusion.
3. Moderate foraminal narrowing bilaterally at L5-S1 is worse on the
left.
4. Moderate right and mild left foraminal stenosis at L3-4.
5. Moderate foraminal stenosis bilaterally at L4-5 is worse on the
right.

## 2021-04-12 ENCOUNTER — Other Ambulatory Visit: Payer: Self-pay

## 2021-04-12 ENCOUNTER — Emergency Department (HOSPITAL_COMMUNITY): Payer: Medicare Other

## 2021-04-12 ENCOUNTER — Inpatient Hospital Stay (HOSPITAL_COMMUNITY)
Admission: EM | Admit: 2021-04-12 | Discharge: 2021-04-20 | DRG: 871 | Disposition: A | Discharge status: Skilled Nursing Facility | Payer: Medicare Other | Attending: Internal Medicine | Admitting: Internal Medicine

## 2021-04-12 ENCOUNTER — Encounter (HOSPITAL_COMMUNITY): Payer: Self-pay

## 2021-04-12 DIAGNOSIS — F05 Delirium due to known physiological condition: Secondary | ICD-10-CM | POA: Diagnosis present

## 2021-04-12 DIAGNOSIS — Z7982 Long term (current) use of aspirin: Secondary | ICD-10-CM

## 2021-04-12 DIAGNOSIS — J441 Chronic obstructive pulmonary disease with (acute) exacerbation: Secondary | ICD-10-CM | POA: Diagnosis not present

## 2021-04-12 DIAGNOSIS — G20 Parkinson's disease: Secondary | ICD-10-CM | POA: Diagnosis present

## 2021-04-12 DIAGNOSIS — Z20822 Contact with and (suspected) exposure to covid-19: Secondary | ICD-10-CM | POA: Diagnosis present

## 2021-04-12 DIAGNOSIS — Z6832 Body mass index (BMI) 32.0-32.9, adult: Secondary | ICD-10-CM

## 2021-04-12 DIAGNOSIS — M48061 Spinal stenosis, lumbar region without neurogenic claudication: Secondary | ICD-10-CM | POA: Diagnosis present

## 2021-04-12 DIAGNOSIS — R000 Tachycardia, unspecified: Secondary | ICD-10-CM | POA: Diagnosis present

## 2021-04-12 DIAGNOSIS — J9601 Acute respiratory failure with hypoxia: Secondary | ICD-10-CM

## 2021-04-12 DIAGNOSIS — E1142 Type 2 diabetes mellitus with diabetic polyneuropathy: Secondary | ICD-10-CM

## 2021-04-12 DIAGNOSIS — A419 Sepsis, unspecified organism: Secondary | ICD-10-CM

## 2021-04-12 DIAGNOSIS — M545 Low back pain, unspecified: Secondary | ICD-10-CM

## 2021-04-12 DIAGNOSIS — E538 Deficiency of other specified B group vitamins: Secondary | ICD-10-CM | POA: Diagnosis present

## 2021-04-12 DIAGNOSIS — R778 Other specified abnormalities of plasma proteins: Secondary | ICD-10-CM | POA: Diagnosis present

## 2021-04-12 DIAGNOSIS — Z833 Family history of diabetes mellitus: Secondary | ICD-10-CM

## 2021-04-12 DIAGNOSIS — Z87891 Personal history of nicotine dependence: Secondary | ICD-10-CM

## 2021-04-12 DIAGNOSIS — B9561 Methicillin susceptible Staphylococcus aureus infection as the cause of diseases classified elsewhere: Secondary | ICD-10-CM

## 2021-04-12 DIAGNOSIS — A4101 Sepsis due to Methicillin susceptible Staphylococcus aureus: Principal | ICD-10-CM | POA: Diagnosis present

## 2021-04-12 DIAGNOSIS — Z8249 Family history of ischemic heart disease and other diseases of the circulatory system: Secondary | ICD-10-CM

## 2021-04-12 DIAGNOSIS — Z7951 Long term (current) use of inhaled steroids: Secondary | ICD-10-CM

## 2021-04-12 DIAGNOSIS — G8929 Other chronic pain: Secondary | ICD-10-CM | POA: Diagnosis present

## 2021-04-12 DIAGNOSIS — E785 Hyperlipidemia, unspecified: Secondary | ICD-10-CM | POA: Diagnosis present

## 2021-04-12 DIAGNOSIS — R296 Repeated falls: Secondary | ICD-10-CM | POA: Diagnosis present

## 2021-04-12 DIAGNOSIS — E669 Obesity, unspecified: Secondary | ICD-10-CM | POA: Diagnosis present

## 2021-04-12 DIAGNOSIS — R7881 Bacteremia: Secondary | ICD-10-CM

## 2021-04-12 DIAGNOSIS — G9341 Metabolic encephalopathy: Secondary | ICD-10-CM | POA: Diagnosis present

## 2021-04-12 DIAGNOSIS — Z7984 Long term (current) use of oral hypoglycemic drugs: Secondary | ICD-10-CM

## 2021-04-12 DIAGNOSIS — Z79899 Other long term (current) drug therapy: Secondary | ICD-10-CM

## 2021-04-12 DIAGNOSIS — G20A1 Parkinson's disease without dyskinesia, without mention of fluctuations: Secondary | ICD-10-CM

## 2021-04-12 DIAGNOSIS — M4316 Spondylolisthesis, lumbar region: Secondary | ICD-10-CM | POA: Diagnosis present

## 2021-04-12 DIAGNOSIS — R4182 Altered mental status, unspecified: Secondary | ICD-10-CM

## 2021-04-12 DIAGNOSIS — Z79890 Hormone replacement therapy (postmenopausal): Secondary | ICD-10-CM

## 2021-04-12 DIAGNOSIS — F419 Anxiety disorder, unspecified: Secondary | ICD-10-CM | POA: Diagnosis present

## 2021-04-12 DIAGNOSIS — I712 Thoracic aortic aneurysm, without rupture: Secondary | ICD-10-CM | POA: Diagnosis present

## 2021-04-12 DIAGNOSIS — M47816 Spondylosis without myelopathy or radiculopathy, lumbar region: Secondary | ICD-10-CM | POA: Diagnosis present

## 2021-04-12 DIAGNOSIS — Z952 Presence of prosthetic heart valve: Secondary | ICD-10-CM

## 2021-04-12 LAB — COMPREHENSIVE METABOLIC PANEL
ALT: 18 U/L (ref 0–44)
AST: 16 U/L (ref 15–41)
Albumin: 3.8 g/dL (ref 3.5–5.0)
Alkaline Phosphatase: 81 U/L (ref 38–126)
Anion gap: 8 (ref 5–15)
BUN: 15 mg/dL (ref 8–23)
CO2: 29 mmol/L (ref 22–32)
Calcium: 8.8 mg/dL — ABNORMAL LOW (ref 8.9–10.3)
Chloride: 100 mmol/L (ref 98–111)
Creatinine, Ser: 1.11 mg/dL (ref 0.61–1.24)
GFR, Estimated: >60 mL/min (ref >60)
Glucose, Bld: 123 mg/dL — ABNORMAL HIGH (ref 70–99)
Potassium: 4.3 mmol/L (ref 3.5–5.1)
Sodium: 137 mmol/L (ref 135–145)
Total Bilirubin: 0.7 mg/dL (ref 0.3–1.2)
Total Protein: 7.5 g/dL (ref 6.5–8.1)

## 2021-04-12 LAB — URINALYSIS, ROUTINE W REFLEX MICROSCOPIC
Bacteria, UA: NONE SEEN
Bilirubin Urine: NEGATIVE
Glucose, UA: 500 mg/dL — AB
Hgb urine dipstick: NEGATIVE
Ketones, ur: NEGATIVE mg/dL
Leukocytes,Ua: NEGATIVE
Nitrite: NEGATIVE
Protein, ur: NEGATIVE mg/dL
Specific Gravity, Urine: 1.02 (ref 1.005–1.030)
pH: 6 (ref 5.0–8.0)

## 2021-04-12 LAB — BLOOD CULTURE ID PANEL (REFLEXED) - BCID2

## 2021-04-12 LAB — BLOOD GAS, ARTERIAL
Acid-Base Excess: 0.9 mmol/L (ref 0.0–2.0)
Bicarbonate: 24.6 mmol/L (ref 20.0–28.0)
O2 Saturation: 94.4 %
Patient temperature: 98.6
pCO2 arterial: 38.1 mmHg (ref 32.0–48.0)
pH, Arterial: 7.426 (ref 7.350–7.450)
pO2, Arterial: 71.1 mmHg — ABNORMAL LOW (ref 83.0–108.0)

## 2021-04-12 LAB — RESPIRATORY PANEL BY PCR

## 2021-04-12 LAB — CBC WITH DIFFERENTIAL/PLATELET
Abs Immature Granulocytes: 0.17 10*3/uL — ABNORMAL HIGH (ref 0.00–0.07)
Basophils Absolute: 0.1 10*3/uL (ref 0.0–0.1)
Basophils Relative: 1 %
Eosinophils Absolute: 0 10*3/uL (ref 0.0–0.5)
Eosinophils Relative: 0 %
HCT: 52.1 % — ABNORMAL HIGH (ref 39.0–52.0)
Hemoglobin: 16.9 g/dL (ref 13.0–17.0)
Immature Granulocytes: 2 %
Lymphocytes Relative: 8 %
Lymphs Abs: 0.9 10*3/uL (ref 0.7–4.0)
MCH: 31.1 pg (ref 26.0–34.0)
MCHC: 32.4 g/dL (ref 30.0–36.0)
MCV: 95.9 fL (ref 80.0–100.0)
Monocytes Absolute: 0.7 10*3/uL (ref 0.1–1.0)
Monocytes Relative: 6 %
Neutro Abs: 9.5 10*3/uL — ABNORMAL HIGH (ref 1.7–7.7)
Neutrophils Relative %: 83 %
Platelets: 187 10*3/uL (ref 150–400)
RBC: 5.43 MIL/uL (ref 4.22–5.81)
RDW: 15 % (ref 11.5–15.5)
WBC: 11.4 10*3/uL — ABNORMAL HIGH (ref 4.0–10.5)
nRBC: 0 % (ref 0.0–0.2)

## 2021-04-12 LAB — TROPONIN I (HIGH SENSITIVITY)
Troponin I (High Sensitivity): 20 ng/L — ABNORMAL HIGH (ref <18)
Troponin I (High Sensitivity): 20 ng/L — ABNORMAL HIGH (ref <18)

## 2021-04-12 LAB — RESP PANEL BY RT-PCR (FLU A&B, COVID) ARPGX2
Influenza A by PCR: NEGATIVE
Influenza B by PCR: NEGATIVE
SARS Coronavirus 2 by RT PCR: NEGATIVE

## 2021-04-12 LAB — CBC
HCT: 47.6 % (ref 39.0–52.0)
Hemoglobin: 15.8 g/dL (ref 13.0–17.0)
MCH: 31.5 pg (ref 26.0–34.0)
MCHC: 33.2 g/dL (ref 30.0–36.0)
MCV: 94.8 fL (ref 80.0–100.0)
Platelets: 162 10*3/uL (ref 150–400)
RBC: 5.02 MIL/uL (ref 4.22–5.81)
RDW: 15 % (ref 11.5–15.5)
WBC: 11.6 10*3/uL — ABNORMAL HIGH (ref 4.0–10.5)
nRBC: 0 % (ref 0.0–0.2)

## 2021-04-12 LAB — HEMOGLOBIN A1C
Hgb A1c MFr Bld: 7.9 % — ABNORMAL HIGH (ref 4.8–5.6)
Mean Plasma Glucose: 180.03 mg/dL

## 2021-04-12 LAB — APTT: aPTT: 24 seconds (ref 24–36)

## 2021-04-12 LAB — LACTIC ACID, PLASMA
Lactic Acid, Venous: 1.5 mmol/L (ref 0.5–1.9)
Lactic Acid, Venous: 1 mmol/L (ref 0.5–1.9)

## 2021-04-12 LAB — PROTIME-INR
INR: 0.8 (ref 0.8–1.2)
Prothrombin Time: 11.6 seconds (ref 11.4–15.2)

## 2021-04-12 LAB — CREATININE, SERUM
Creatinine, Ser: 0.96 mg/dL (ref 0.61–1.24)
GFR, Estimated: >60 mL/min (ref >60)

## 2021-04-12 LAB — GLUCOSE, CAPILLARY
Glucose-Capillary: 280 mg/dL — ABNORMAL HIGH (ref 70–99)
Glucose-Capillary: 241 mg/dL — ABNORMAL HIGH (ref 70–99)

## 2021-04-12 IMAGING — CT CT HEAD W/O CM
2 of 6 series · 11 of 47 positions shown, 13 images · non-contrast
Comparison: Brain MRI [DATE]

CLINICAL DATA: Altered level of consciousness.

EXAM:
CT HEAD WITHOUT CONTRAST
TECHNIQUE: Contiguous axial images were obtained from the base of the skull
through the vertex without intravenous contrast.

[Series 5: coronal soft tissue · coronal · 0.10mm/px · 3 of 74 slices shown]
[im 28/74  brain]
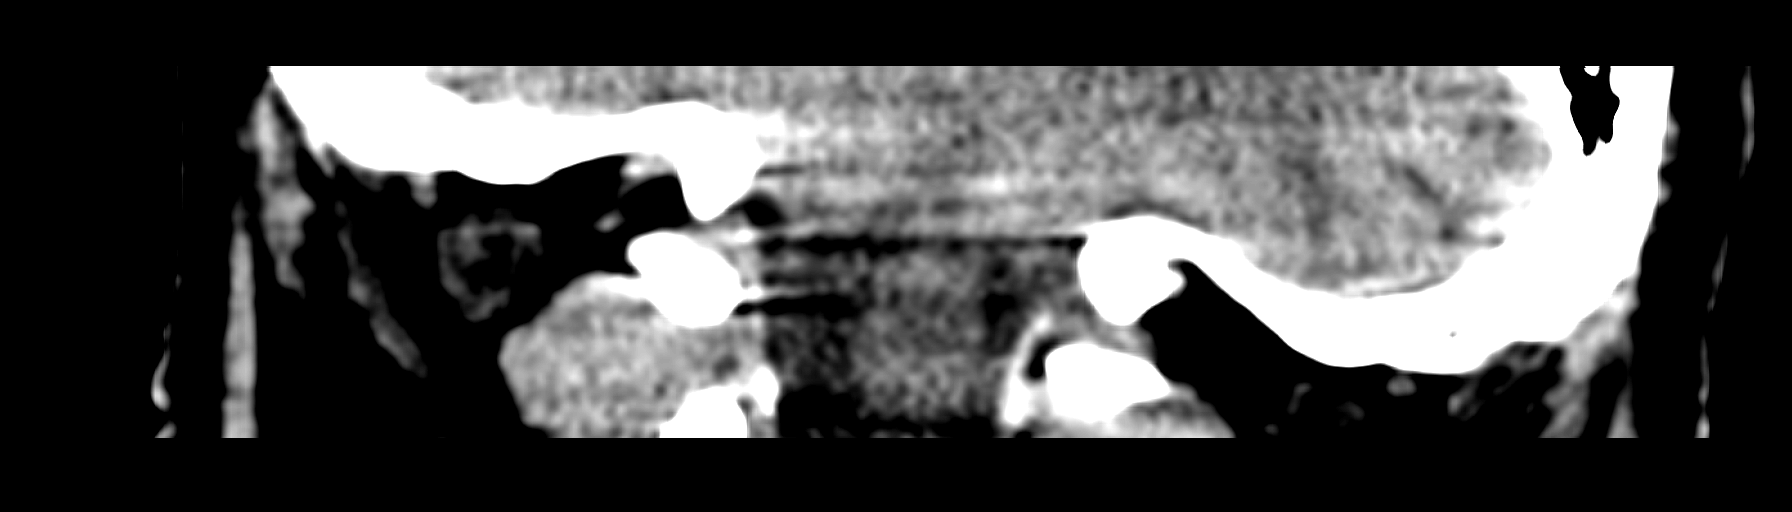
[im 37/74  brain]
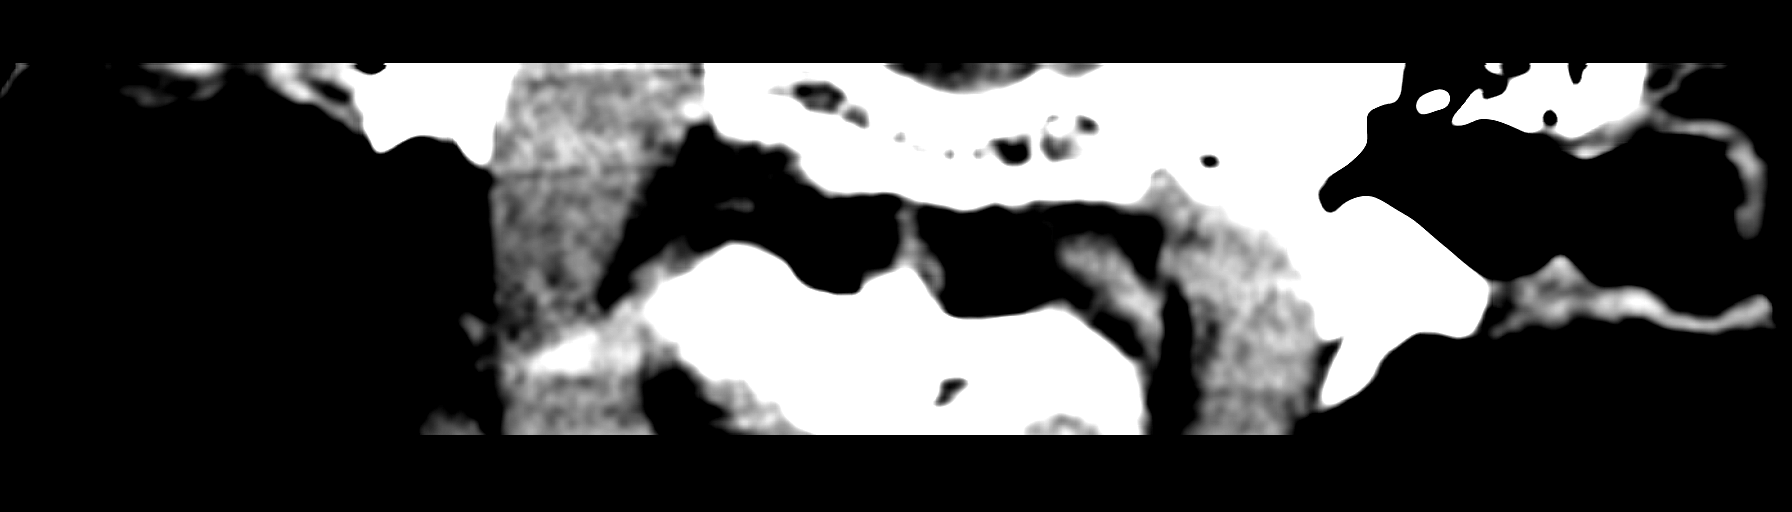
[im 46/74  brain]
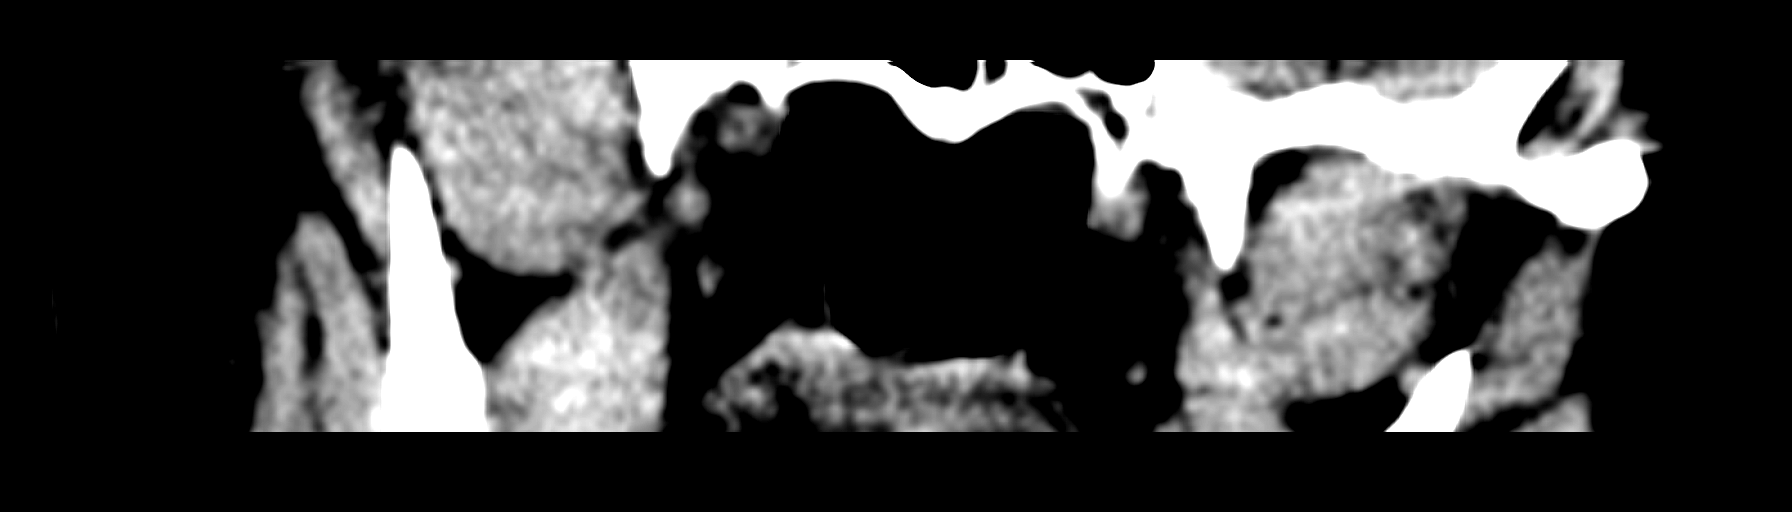

[Series 7: head wo · axial · 0.47mm/px · z∈[-102,+28]mm · 8 of 32 slices shown, 10 images]
[im 3/32  brain]
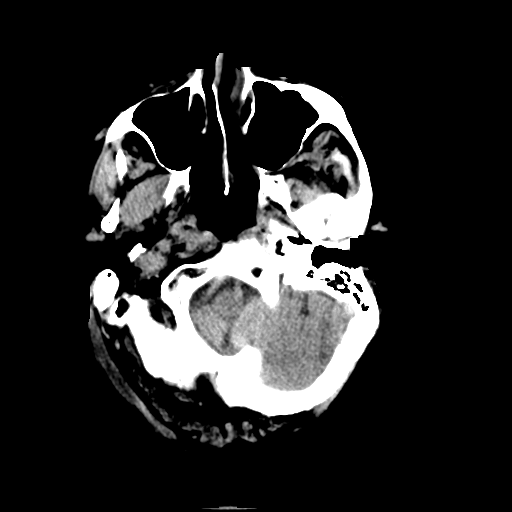
[im 3/32  bone]
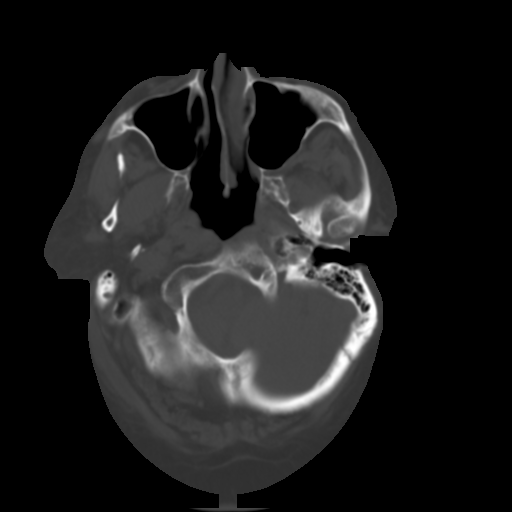
[im 7/32  brain]
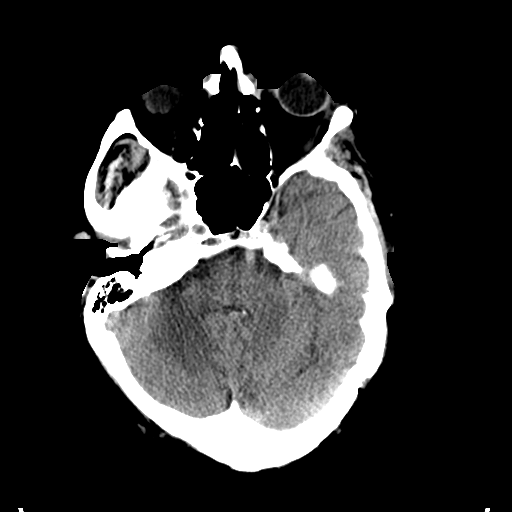
[im 10/32  brain]
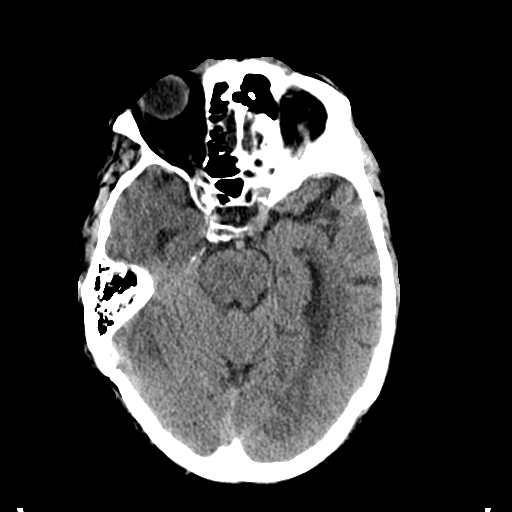
[im 14/32  brain]
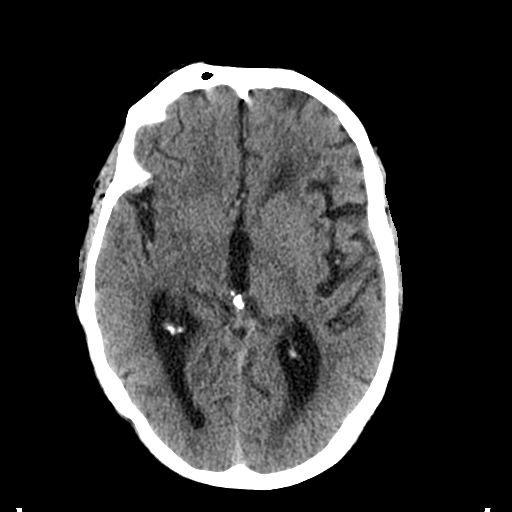
[im 18/32  brain]
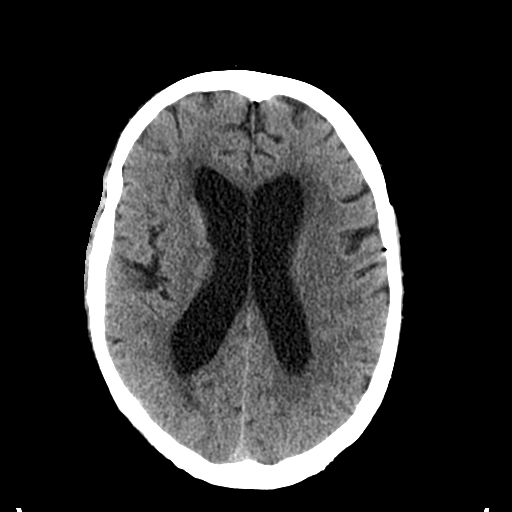
[im 18/32  bone]
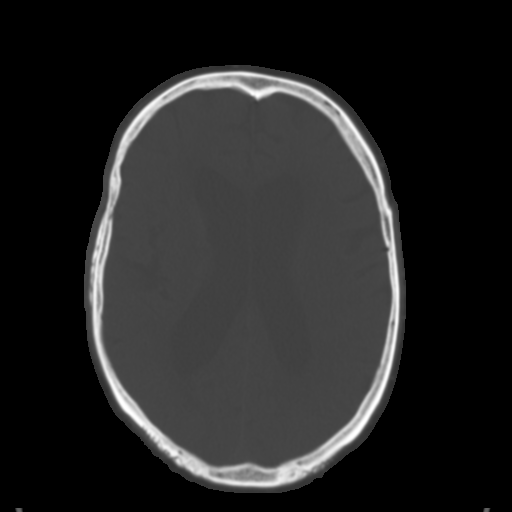
[im 22/32  brain]
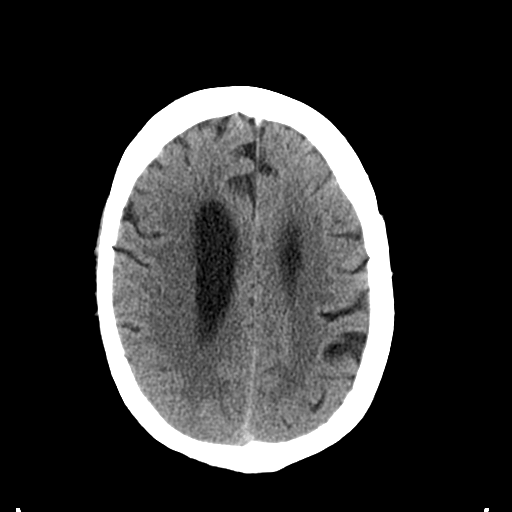
[im 25/32  brain]
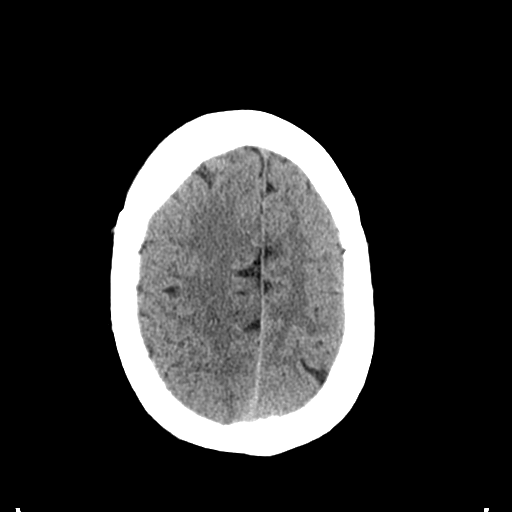
[im 29/32  brain]
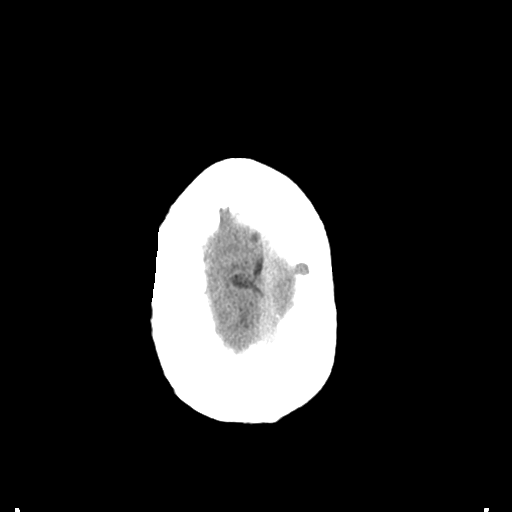

[11 of 47 positions shown; findings below may reference images not displayed]

FINDINGS: Brain: No evidence of acute infarction, hemorrhage, hydrocephalus,
extra-axial collection or mass lesion/mass effect. Central
predominant cerebral volume loss with mild chronic small vessel
ischemia.

Vascular: No hyperdense vessel or unexpected calcification.

Skull: Normal. Negative for fracture or focal lesion.

Sinuses/Orbits: No acute finding.
IMPRESSION: Senescent changes without acute finding.

## 2021-04-12 IMAGING — DX DG CHEST 1V PORT
1 series · 1 of 1 positions shown · non-contrast
Comparison: [DATE]

CLINICAL DATA: Questionable sepsis

EXAM:
PORTABLE CHEST 1 VIEW

[chest ap]
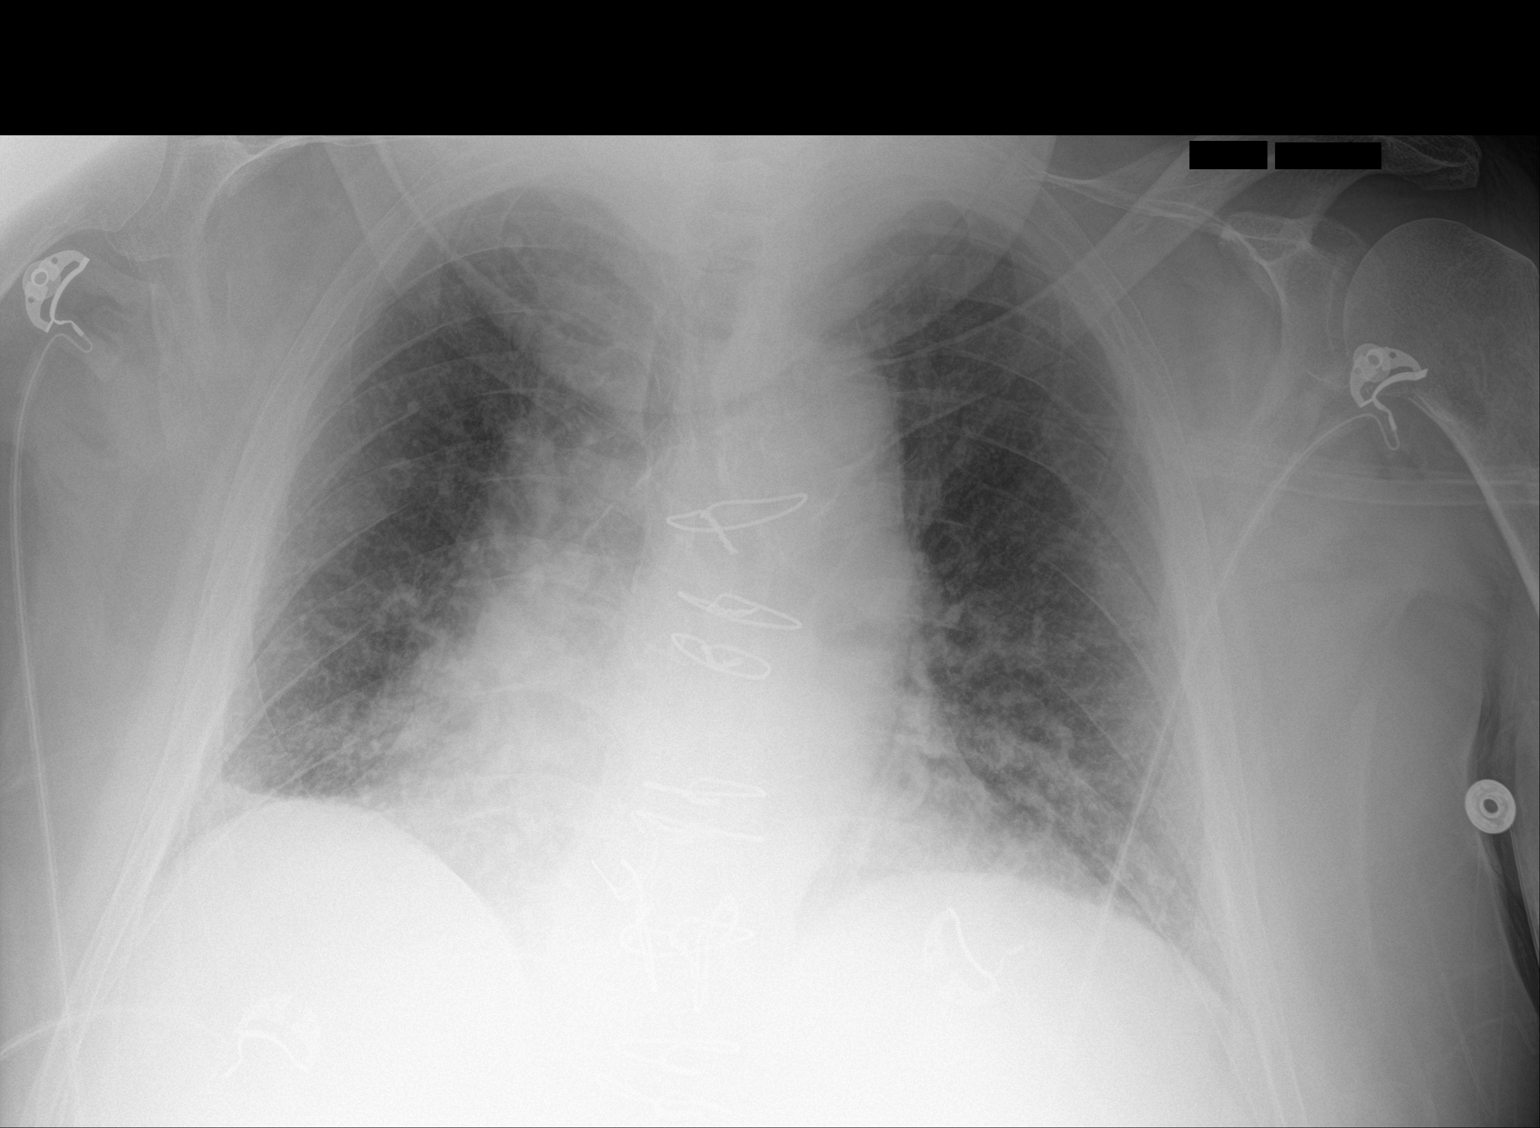

[1 of 1 positions shown; findings below may reference images not displayed]

FINDINGS: Chronic cardiomegaly. Stable aortic and hilar contours when
accounting for rotation. Prior aortic valve repair. Interstitial
prominence. No focal opacity, effusion, pneumothorax, or Kerley
lines
IMPRESSION: 1. Vascular congestion versus hypoinflation.
2. Chronic cardiomegaly.
3. No clear pneumonia.

## 2021-04-12 MED ORDER — IPRATROPIUM BROMIDE HFA 17 MCG/ACT IN AERS
2.0000 | INHALATION_SPRAY | Freq: Once | RESPIRATORY_TRACT | Status: AC
  Administered 2021-04-12: 2 via RESPIRATORY_TRACT
  Filled 2021-04-12: qty 12.9

## 2021-04-12 MED ORDER — INSULIN ASPART 100 UNIT/ML IJ SOLN
0.0000 [IU] | Freq: Every day | INTRAMUSCULAR | Status: DC
  Administered 2021-04-12 – 2021-04-17 (×2): 2 [IU] via SUBCUTANEOUS
  Filled 2021-04-12: qty 0.05

## 2021-04-12 MED ORDER — ALBUTEROL SULFATE HFA 108 (90 BASE) MCG/ACT IN AERS
6.0000 | INHALATION_SPRAY | Freq: Once | RESPIRATORY_TRACT | Status: AC
  Administered 2021-04-12: 6 via RESPIRATORY_TRACT
  Filled 2021-04-12: qty 6.7

## 2021-04-12 MED ORDER — INSULIN ASPART 100 UNIT/ML IJ SOLN
0.0000 [IU] | Freq: Three times a day (TID) | INTRAMUSCULAR | Status: DC
  Administered 2021-04-12: 8 [IU] via SUBCUTANEOUS
  Administered 2021-04-13 (×2): 3 [IU] via SUBCUTANEOUS
  Administered 2021-04-13 – 2021-04-15 (×4): 2 [IU] via SUBCUTANEOUS
  Administered 2021-04-17: 3 [IU] via SUBCUTANEOUS
  Administered 2021-04-18 (×2): 2 [IU] via SUBCUTANEOUS
  Administered 2021-04-19: 3 [IU] via SUBCUTANEOUS
  Administered 2021-04-19: 2 [IU] via SUBCUTANEOUS
  Filled 2021-04-12: qty 0.15

## 2021-04-12 MED ORDER — SODIUM CHLORIDE 0.9 % IV SOLN
2.0000 g | Freq: Once | INTRAVENOUS | Status: AC
  Administered 2021-04-12: 2 g via INTRAVENOUS
  Filled 2021-04-12: qty 2

## 2021-04-12 MED ORDER — METHYLPREDNISOLONE SODIUM SUCC 125 MG IJ SOLR
60.0000 mg | Freq: Two times a day (BID) | INTRAMUSCULAR | Status: AC
  Administered 2021-04-12 – 2021-04-13 (×2): 60 mg via INTRAVENOUS
  Filled 2021-04-12 (×2): qty 2

## 2021-04-12 MED ORDER — METHYLPREDNISOLONE SODIUM SUCC 125 MG IJ SOLR
125.0000 mg | Freq: Once | INTRAMUSCULAR | Status: AC
  Administered 2021-04-12: 125 mg via INTRAVENOUS
  Filled 2021-04-12: qty 2

## 2021-04-12 MED ORDER — ACETAMINOPHEN 650 MG RE SUPP: 650.0000 mg | Freq: Four times a day (QID) | RECTAL | Status: DC | PRN

## 2021-04-12 MED ORDER — LACTATED RINGERS IV BOLUS
1000.0000 mL | Freq: Once | INTRAVENOUS | Status: AC
  Administered 2021-04-12: 1000 mL via INTRAVENOUS

## 2021-04-12 MED ORDER — SODIUM CHLORIDE 0.9 % IV SOLN
1.0000 g | INTRAVENOUS | Status: DC
Start: 2021-04-12 — End: 2021-04-13
  Administered 2021-04-12 – 2021-04-13 (×2): 1 g via INTRAVENOUS
  Filled 2021-04-12 (×2): qty 10

## 2021-04-12 MED ORDER — BUPROPION HCL ER (XL) 300 MG PO TB24
300.0000 mg | ORAL_TABLET | Freq: Every day | ORAL | Status: DC
  Administered 2021-04-13 – 2021-04-19 (×8): 300 mg via ORAL
  Filled 2021-04-12 (×8): qty 1

## 2021-04-12 MED ORDER — ONDANSETRON HCL 4 MG/2ML IJ SOLN
4.0000 mg | Freq: Four times a day (QID) | INTRAMUSCULAR | Status: DC | PRN
  Administered 2021-04-13: 4 mg via INTRAVENOUS
  Filled 2021-04-12: qty 2

## 2021-04-12 MED ORDER — ACETAMINOPHEN 325 MG PO TABS
650.0000 mg | ORAL_TABLET | Freq: Four times a day (QID) | ORAL | Status: DC | PRN
  Administered 2021-04-12 – 2021-04-18 (×2): 650 mg via ORAL
  Filled 2021-04-12 (×2): qty 2

## 2021-04-12 MED ORDER — ONDANSETRON HCL 4 MG PO TABS: 4.0000 mg | ORAL_TABLET | Freq: Four times a day (QID) | ORAL | Status: DC | PRN

## 2021-04-12 MED ORDER — HYDROXYZINE HCL 25 MG PO TABS
25.0000 mg | ORAL_TABLET | Freq: Once | ORAL | Status: AC
  Administered 2021-04-13: 25 mg via ORAL
  Filled 2021-04-12: qty 1

## 2021-04-12 MED ORDER — IOHEXOL 350 MG/ML SOLN
100.0000 mL | Freq: Once | INTRAVENOUS | Status: AC | PRN
  Administered 2021-04-12: 100 mL via INTRAVENOUS

## 2021-04-12 MED ORDER — SENNOSIDES-DOCUSATE SODIUM 8.6-50 MG PO TABS: 1.0000 | ORAL_TABLET | Freq: Every evening | ORAL | Status: DC | PRN

## 2021-04-12 MED ORDER — ENOXAPARIN SODIUM 40 MG/0.4ML IJ SOSY
40.0000 mg | PREFILLED_SYRINGE | INTRAMUSCULAR | Status: DC
  Administered 2021-04-12 – 2021-04-19 (×8): 40 mg via SUBCUTANEOUS
  Filled 2021-04-12 (×8): qty 0.4

## 2021-04-12 MED ORDER — PREDNISONE 20 MG PO TABS
40.0000 mg | ORAL_TABLET | Freq: Every day | ORAL | Status: DC
  Filled 2021-04-12: qty 2

## 2021-04-12 MED ORDER — IPRATROPIUM-ALBUTEROL 0.5-2.5 (3) MG/3ML IN SOLN
3.0000 mL | Freq: Four times a day (QID) | RESPIRATORY_TRACT | Status: DC
  Administered 2021-04-12 – 2021-04-13 (×5): 3 mL via RESPIRATORY_TRACT
  Filled 2021-04-12 (×5): qty 3

## 2021-04-12 MED ORDER — HYDROCODONE-ACETAMINOPHEN 5-325 MG PO TABS
1.0000 | ORAL_TABLET | ORAL | Status: DC | PRN
  Administered 2021-04-12 – 2021-04-14 (×5): 1 via ORAL
  Administered 2021-04-15: 2 via ORAL
  Administered 2021-04-16 – 2021-04-17 (×4): 1 via ORAL
  Administered 2021-04-17: 2 via ORAL
  Administered 2021-04-17: 1 via ORAL
  Administered 2021-04-18 – 2021-04-19 (×3): 2 via ORAL
  Administered 2021-04-19: 1 via ORAL
  Administered 2021-04-19: 2 via ORAL
  Filled 2021-04-12: qty 1
  Filled 2021-04-12: qty 2
  Filled 2021-04-12 (×2): qty 1
  Filled 2021-04-12: qty 2
  Filled 2021-04-12 (×3): qty 1
  Filled 2021-04-12: qty 2
  Filled 2021-04-12: qty 1
  Filled 2021-04-12 (×3): qty 2
  Filled 2021-04-12 (×3): qty 1
  Filled 2021-04-12: qty 2
  Filled 2021-04-12: qty 1
  Filled 2021-04-12: qty 2

## 2021-04-12 NOTE — Progress Notes (Signed)
PHARMACY - PHYSICIAN COMMUNICATION CRITICAL VALUE ALERT - BLOOD CULTURE IDENTIFICATION (BCID)  Michael Hines is an 75 y.o. male who presented to [facility] on 04/12/2021 with a chief complaint of dyspnea, ? COPD exacerbation  Assessment:  +BCID for S.aureus (no resistance detected) in 2/4 bottles (1 bottle out of each set)  Name of physician (or Provider) Contacted: X. Blount, FNP  Current antibiotics: Ceftriaxone 1gm IV q24h for ? COPD exacerbation  Changes to prescribed antibiotics recommended:  Provider advised to continue on Ceftriaxone for now  Results for orders placed or performed during the hospital encounter of 04/12/21  Blood Culture ID Panel (Reflexed) (Collected: 04/12/2021  4:24 AM)  Result Value Ref Range   Enterococcus faecalis NOT DETECTED NOT DETECTED   Enterococcus Faecium NOT DETECTED NOT DETECTED   Listeria monocytogenes NOT DETECTED NOT DETECTED   Staphylococcus species DETECTED (A) NOT DETECTED   Staphylococcus aureus (BCID) DETECTED (A) NOT DETECTED   Staphylococcus epidermidis NOT DETECTED NOT DETECTED   Staphylococcus lugdunensis NOT DETECTED NOT DETECTED   Streptococcus species NOT DETECTED NOT DETECTED   Streptococcus agalactiae NOT DETECTED NOT DETECTED   Streptococcus pneumoniae NOT DETECTED NOT DETECTED   Streptococcus pyogenes NOT DETECTED NOT DETECTED   A.calcoaceticus-baumannii NOT DETECTED NOT DETECTED   Bacteroides fragilis NOT DETECTED NOT DETECTED   Enterobacterales NOT DETECTED NOT DETECTED   Enterobacter cloacae complex NOT DETECTED NOT DETECTED   Escherichia coli NOT DETECTED NOT DETECTED   Klebsiella aerogenes NOT DETECTED NOT DETECTED   Klebsiella oxytoca NOT DETECTED NOT DETECTED   Klebsiella pneumoniae NOT DETECTED NOT DETECTED   Proteus species NOT DETECTED NOT DETECTED   Salmonella species NOT DETECTED NOT DETECTED   Serratia marcescens NOT DETECTED NOT DETECTED   Haemophilus influenzae NOT DETECTED NOT DETECTED   Neisseria  meningitidis NOT DETECTED NOT DETECTED   Pseudomonas aeruginosa NOT DETECTED NOT DETECTED   Stenotrophomonas maltophilia NOT DETECTED NOT DETECTED   Candida albicans NOT DETECTED NOT DETECTED   Candida auris NOT DETECTED NOT DETECTED   Candida glabrata NOT DETECTED NOT DETECTED   Candida krusei NOT DETECTED NOT DETECTED   Candida parapsilosis NOT DETECTED NOT DETECTED   Candida tropicalis NOT DETECTED NOT DETECTED   Cryptococcus neoformans/gattii NOT DETECTED NOT DETECTED   Meth resistant mecA/C and MREJ NOT DETECTED NOT DETECTED    Everette Rank, PharmD 04/12/2021  11:31 PM

## 2021-04-12 NOTE — ED Notes (Signed)
Pt transported to CT ?

## 2021-04-12 NOTE — ED Provider Notes (Signed)
[facility] Emergency Department Provider Note MRN:  [id number]  Arrival date & time: 04/12/21     Chief Complaint   Altered Mental Status   History of Present Illness   Michael Hines is a 75 y.o. year-old male with a history of diabetes, COPD presenting to the ED with chief complaint of altered mental status.  Per report, patient became altered today.  Also with vomiting, shortness of breath.  I was unable to obtain an accurate HPI, PMH, or ROS due to the patient's altered mental status.  Level 5 caveat.  Review of Systems  Positive for vomiting, altered mental status, shortness of breath, productive cough, chest pain.  Patient's Health History    Past Medical History:  Diagnosis Date   Anxiety    B12 deficiency    Back pain    Diabetes mellitus without complication ([place])    Hyperlipidemia    Testosterone insufficiency     Past Surgical History:  Procedure Laterality Date   AORTIC VALVE REPLACEMENT  2013   redo bioprosthetic valve at [facility], Dr Mauricio Po   CHOLECYSTECTOMY     STRABISMUS SURGERY     TEE WITHOUT CARDIOVERSION N/A 09/07/2018   Procedure: TRANSESOPHAGEAL ECHOCARDIOGRAM (TEE);  Surgeon: Acie Fredrickson, Wonda Cheng, MD;  Location: [facility] ENDOSCOPY;  Service: Cardiovascular;  Laterality: N/A;    Family History  Problem Relation Age of Onset   Hypertension Mother    Diabetes Mother     Social History   Socioeconomic History   Marital status: Married    Spouse name: Not on file   Number of children: Not on file   Years of education: Not on file   Highest education level: Not on file  Occupational History   Not on file  Tobacco Use   Smoking status: Former    Packs/day: 1.50    Years: 55.00    Pack years: 82.50    Types: Cigarettes    Quit date: 08/24/2018    Years since quitting: 2.6   Smokeless tobacco: Former  Scientific laboratory technician Use: Never used  Substance and Sexual Activity   Alcohol use: Not Currently   Drug use: Never    Sexual activity: Not on file  Other Topics Concern   Not on file  Social History Narrative   Not on file   Social Determinants of Health   Financial Resource Strain: Not on file  Food Insecurity: Not on file  Transportation Needs: Not on file  Physical Activity: Not on file  Stress: Not on file  Social Connections: Not on file  Intimate Partner Violence: Not on file     Physical Exam   Vitals:   04/12/21 0545 04/12/21 0615  BP: 112/83 (!) 140/99  Pulse: (!) 120 (!) 121  Resp: (!) 25 (!) 26  Temp:    SpO2: 94% 94%    CONSTITUTIONAL: Ill-appearing, in mild respiratory distress NEURO:  Alert and oriented x 3, no focal deficits EYES:  eyes equal and reactive ENT/NECK:  no LAD, no JVD CARDIO: Tachycardic rate, well-perfused, normal S1 and S2 PULM: Scattered wheezes GI/GU:  normal bowel sounds, non-distended, non-tender MSK/SPINE:  No gross deformities, no edema SKIN:  no rash, atraumatic PSYCH:  Appropriate speech and behavior  *Additional and/or pertinent findings included in MDM below  Diagnostic and Interventional Summary    EKG Interpretation  Date/Time:  Friday April 12 2021 04:25:23 EDT Ventricular Rate:  132 PR Interval:  161 QRS Duration: 81 QT Interval:  275  QTC Calculation: 408 R Axis:   -17 Text Interpretation: Sinus tachycardia Borderline left axis deviation Abnormal R-wave progression, late transition Confirmed by Gerlene Fee [id number]) on 04/12/2021 5:14:46 AM       Labs Reviewed  COMPREHENSIVE METABOLIC PANEL - Abnormal; Notable for the following components:      Result Value   Glucose, Bld 123 (*)    Calcium 8.8 (*)    All other components within normal limits  CBC WITH DIFFERENTIAL/PLATELET - Abnormal; Notable for the following components:   WBC 11.4 (*)    HCT 52.1 (*)    Neutro Abs 9.5 (*)    Abs Immature Granulocytes 0.17 (*)    All other components within normal limits  TROPONIN I (HIGH SENSITIVITY) - Abnormal; Notable for the  following components:   Troponin I (High Sensitivity) 20 (*)    All other components within normal limits  CULTURE, BLOOD (ROUTINE X 2)  CULTURE, BLOOD (ROUTINE X 2)  LACTIC ACID, PLASMA  PROTIME-INR  APTT  LACTIC ACID, PLASMA  URINALYSIS, ROUTINE W REFLEX MICROSCOPIC  TROPONIN I (HIGH SENSITIVITY)    DG Chest Port 1 View  Final Result    CT HEAD WO CONTRAST (5MM)    (Results Pending)  CT Angio Chest/Abd/Pel for Dissection W and/or Wo Contrast    (Results Pending)    Medications  lactated ringers bolus 1,000 mL (0 mLs Intravenous Stopped 04/12/21 0529)  ceFEPIme (MAXIPIME) 2 g in sodium chloride 0.9 % 100 mL IVPB (0 g Intravenous Stopped 04/12/21 0520)  lactated ringers bolus 1,000 mL (0 mLs Intravenous Stopped 04/12/21 0531)  iohexol (OMNIPAQUE) 350 MG/ML injection 100 mL (100 mLs Intravenous Contrast Given 04/12/21 [id number])     Procedures  /  Critical Care .Critical Care  Date/Time: 04/12/2021 6:51 AM Performed by: Maudie Flakes, MD Authorized by: Maudie Flakes, MD   Critical care provider statement:    Critical care time (minutes):  32   Critical care was necessary to treat or prevent imminent or life-threatening deterioration of the following conditions:  Sepsis   Critical care was time spent personally by me on the following activities:  Discussions with consultants, evaluation of patient's response to treatment, examination of patient, ordering and performing treatments and interventions, ordering and review of laboratory studies, ordering and review of radiographic studies, pulse oximetry, re-evaluation of patient's condition, obtaining history from patient or surrogate and review of old charts  ED Course and Medical Decision Making  I have reviewed the triage vital signs, the nursing notes, and pertinent available records from the EMR.  Listed above are laboratory and imaging tests that I personally ordered, reviewed, and interpreted and then considered in my medical decision  making (see below for details).  Concern for COPD exacerbation versus sepsis of pulmonary origin.  Patient also endorsing chest pain, also has a distended abdomen.  Tachycardic, afebrile here in the emergency department.  Providing IV fluids, awaiting labs, will consider CT imaging and/or empiric antibiotics.     Heart rate improving with fluids.  Still awaiting urinalysis, CT imaging.  Anticipating discharge.  Signed out to oncoming provider at shift change.  Barth Kirks. Sedonia Small, [place] [email or web address]  Final Clinical Impressions(s) / ED Diagnoses     ICD-10-CM   1. Sepsis, due to unspecified organism, unspecified whether acute organ dysfunction present [facility])  A41.9       ED Discharge Orders     None  Discharge Instructions Discussed with and Provided to Patient:   Discharge Instructions   None       Maudie Flakes, MD 04/12/21 [phone]

## 2021-04-12 NOTE — ED Triage Notes (Signed)
EMS from home. HX COPD. Wife called EMS because pt was shaking earlier in the evening. Pt vomited on scene. 120 HR, 94% room air on scene given two duonebs and zofran.

## 2021-04-12 NOTE — Sepsis Progress Note (Signed)
Elink following Code Sepsis. 

## 2021-04-12 NOTE — ED Provider Notes (Addendum)
Assumed care of patient at 7 AM.  Here with altered mental status, may be fever.  Patient with history of COPD, diabetes.  He on my evaluation seems more alert and able to answer questions appropriately.  He has stated he has had a cough and shortness of breath and wheezing.  He has had a productive cough.  Patient with history of aneurysm as well.  Patient tachycardic upon arrival and sepsis work-up was initiated.  IV antibiotics were given.  Lab work shows stable troponin x2 at 20.  COVID and flu test negative.  No urine infection.  No obvious lung infection.  Lactic acid normal.  CT head was unremarkable.  Dissection study was ordered that showed no acute finding.  No acute evidence of aortic syndrome.  Appears to have stable aneurysm of his a sending aorta.  On my evaluation patient does drop his oxygen into the 80s.  He has wheezing and poor air movement.  He has been placed on 2 L of oxygen during the hospital stay and seems to have improved his mentation.  My suspicion is that he likely was hypoxic causing confusion now improving.  Will give breathing treatment and IV steroids.  Will admit for further care.  Have lower suspicion for sepsis at this time and suspect a COPD exacerbation with new oxygen requirement.  This could possibly be a PE, dissection study does not completely rule it out as not time study for PE.  But no evidence of obvious PE on dissection study.  Talked with hospitalist may consider DVT studies.  This chart was dictated using voice recognition software.  Despite best efforts to proofread,  errors can occur which can change the documentation meaning.    Lennice Sites, DO 04/12/21 [id number]    Lennice Sites, DO 04/12/21 [id number]

## 2021-04-12 NOTE — Plan of Care (Signed)
  Problem: Education: Goal: Knowledge of General Education information will improve Description Including pain rating scale, medication(s)/side effects and non-pharmacologic comfort measures Outcome: Progressing   Problem: Health Behavior/Discharge Planning: Goal: Ability to manage health-related needs will improve Outcome: Progressing   

## 2021-04-12 NOTE — H&P (Signed)
History and Physical    Michael Hines DOB: 1946-02-27 DOA: 04/12/2021  PCP: Finis Bud, MD  Patient coming from: Home  Chief Complaint: dyspnea, confusion  HPI: Michael Hines is a 75 y.o. male with medical history significant of tobacco abuse, COPD, DM2, anxiety. Presenting with dyspnea and confusion. History is from wife at bedside. She reports that she woke him up in the middle of the night with heavy breathing. He said he was cold and he was shaking. He didn't seem like his normal self. This frightened her, so she called for EMS. She reports that he has several chronic problems that are being workup up with his PCP and specialists. He is generally chronically ill, so it is a little more difficult to tell if there has been a change in his status prior to last night. Neither has she noticed that he has used his inhaler more frequently, nor has she noticed increased cough. When EMS arrived, the patient was noted to be A&O x 2 (name/place). He was brought to the ED for evaluation.   ED Course: He was found to be tachycardic and tachypneic. CTH and CTA Chest/abdomen/pelvis were negative for acute change. He had mild leukocytosis (11.4) and mild flatly elevated troponin (20, 20). He was hypoxic on RA. He was given fluids, cefepime, and inhalers and steroids. [facility] was called for admission.   Review of Systems:  Denies CP, palpitations, N/V/D, fever, sick contacts. Review of systems is otherwise negative for all not mentioned in HPI.   PMHx Past Medical History:  Diagnosis Date   Anxiety    B12 deficiency    Back pain    Diabetes mellitus without complication ([place])    Hyperlipidemia    Testosterone insufficiency     PSHx Past Surgical History:  Procedure Laterality Date   AORTIC VALVE REPLACEMENT  2013   redo bioprosthetic valve at [facility], Dr Mauricio Po   CHOLECYSTECTOMY     STRABISMUS SURGERY     TEE WITHOUT CARDIOVERSION N/A 09/07/2018   Procedure:  TRANSESOPHAGEAL ECHOCARDIOGRAM (TEE);  Surgeon: Acie Fredrickson Wonda Cheng, MD;  Location: [facility] ENDOSCOPY;  Service: Cardiovascular;  Laterality: N/A;    SocHx  reports that he quit smoking about 2 years ago. His smoking use included cigarettes. He has a 82.50 pack-year smoking history. He has quit using smokeless tobacco. He reports that he does not currently use alcohol. He reports that he does not use drugs.  No Known Allergies  FamHx Family History  Problem Relation Age of Onset   Hypertension Mother    Diabetes Mother     Prior to Admission medications   Medication Sig Start Date End Date Taking? Authorizing Provider  aspirin EC 81 MG tablet Take 325 mg by mouth every evening.    [provider]  atorvastatin (LIPITOR) 20 MG tablet Take 20 mg by mouth at bedtime. 06/30/18   [provider]  budesonide-formoterol (SYMBICORT) 80-4.5 MCG/ACT inhaler Inhale 2 puffs into the lungs 2 (two) times daily.    [provider]  buPROPion (WELLBUTRIN XL) 300 MG 24 hr tablet Take 300 mg by mouth daily. 07/04/19   [provider]  citalopram (CELEXA) 20 MG tablet Take 1 tablet (20 mg total) by mouth daily for 30 days. 09/15/18 08/31/19  Donne Hazel, MD  clotrimazole-betamethasone (LOTRISONE) cream Apply 1 application topically 2 (two) times daily as needed for rash. 08/03/19   [provider]  cyanocobalamin (,VITAMIN B-12,) 1000 MCG/ML injection Inject 1,000 mcg into  the muscle every 30 (thirty) days.  08/17/18   [provider]  gabapentin (NEURONTIN) 300 MG capsule Take 300-600 mg by mouth See admin instructions. Take '300mg'$  in the morning and '600mg'$  in the evening. 08/23/19   [provider]  HYDROcodone-acetaminophen (NORCO) 10-325 MG tablet Take 1 tablet by mouth every 6 (six) hours as needed for pain. 07/04/19   [provider]  ibuprofen (ADVIL,MOTRIN) 600 MG tablet Take 1 tablet (600 mg total) by mouth every 6 (six) hours as needed for  moderate pain. 09/14/18   Donne Hazel, MD  loperamide (IMODIUM) 2 MG capsule Take 1 capsule (2 mg total) by mouth as needed for diarrhea or loose stools. 09/07/18   Kayleen Memos, DO  LORazepam (ATIVAN) 0.5 MG tablet Take 1 tablet (0.5 mg total) by mouth at bedtime. 09/09/18   Lawyer, Harrell Gave, PA-C  ondansetron (ZOFRAN ODT) 4 MG disintegrating tablet '4mg'$  ODT q8 hours prn nausea/vomit 12/05/19   Palumbo, April, MD  pioglitazone (ACTOS) 15 MG tablet Take 15 mg by mouth daily. 07/04/19   [provider]  testosterone cypionate (DEPOTESTOSTERONE CYPIONATE) 200 MG/ML injection Inject 200 mg into the muscle every 14 (fourteen) days. 05/06/19   [provider]  tiZANidine (ZANAFLEX) 4 MG tablet Take 4-8 mg by mouth every 8 (eight) hours as needed. 09/11/19   [provider]    Physical Exam: Vitals:   04/12/21 0715 04/12/21 0730 04/12/21 0816 04/12/21 0830  BP: (!) 135/101 (!) 126/98 135/79 (!) 144/98  Pulse: (!) 115 (!) 116 (!) 112 (!) 114  Resp: (!) 23 (!) 23 (!) 22 (!) 24  Temp:      TempSrc:      SpO2: 94% 94% 93% 93%    General: 75 y.o. male resting in bed in NAD Eyes: PERRL, normal sclera ENMT: Nares patent w/o discharge, orophaynx clear, dentition normal, ears w/o discharge/lesions/ulcers Neck: Supple, trachea midline Cardiovascular: tachy, +S1, S2, no m/g/r, equal pulses throughout Respiratory: decreased at bases, scattered soft expiratory, slightly increased WOB on 2L GI: BS+, protuberant, soft, NT, no masses noted, no organomegaly noted MSK: No e/c/c Skin: No rashes, bruises, ulcerations noted Neuro: A&O x 3, no focal deficits Psyc: Appropriate interaction and affect, calm/cooperative  Labs on Admission: I have personally reviewed following labs and imaging studies  CBC: Recent Labs  Lab 04/12/21 0423  WBC 11.4*  NEUTROABS 9.5*  HGB 16.9  HCT 52.1*  MCV 95.9  PLT 123XX123   Basic Metabolic Panel: Recent Labs  Lab 04/12/21 0423  NA 137  K  4.3  CL 100  CO2 29  GLUCOSE 123*  BUN 15  CREATININE 1.11  CALCIUM 8.8*   GFR: CrCl cannot be calculated (Unknown ideal weight.). Liver Function Tests: Recent Labs  Lab 04/12/21 0423  AST 16  ALT 18  ALKPHOS 81  BILITOT 0.7  PROT 7.5  ALBUMIN 3.8   No results for input(s): LIPASE, AMYLASE in the last 168 hours. No results for input(s): AMMONIA in the last 168 hours. Coagulation Profile: Recent Labs  Lab 04/12/21 0423  INR 0.8   Cardiac Enzymes: No results for input(s): CKTOTAL, CKMB, CKMBINDEX, TROPONINI in the last 168 hours. BNP (last 3 results) No results for input(s): PROBNP in the last 8760 hours. HbA1C: No results for input(s): HGBA1C in the last 72 hours. CBG: No results for input(s): GLUCAP in the last 168 hours. Lipid Profile: No results for input(s): CHOL, HDL, LDLCALC, TRIG, CHOLHDL, LDLDIRECT in the last 72 hours. Thyroid  Function Tests: No results for input(s): TSH, T4TOTAL, FREET4, T3FREE, THYROIDAB in the last 72 hours. Anemia Panel: No results for input(s): VITAMINB12, FOLATE, FERRITIN, TIBC, IRON, RETICCTPCT in the last 72 hours. Urine analysis:    Component Value Date/Time   COLORURINE YELLOW 04/12/2021 0616   APPEARANCEUR CLEAR 04/12/2021 0616   LABSPEC 1.020 04/12/2021 0616   PHURINE 6.0 04/12/2021 0616   GLUCOSEU 500 (A) 04/12/2021 0616   HGBUR NEGATIVE 04/12/2021 0616   BILIRUBINUR NEGATIVE 04/12/2021 0616   KETONESUR NEGATIVE 04/12/2021 0616   PROTEINUR NEGATIVE 04/12/2021 0616   NITRITE NEGATIVE 04/12/2021 0616   LEUKOCYTESUR NEGATIVE 04/12/2021 0616    Radiological Exams on Admission: CT HEAD WO CONTRAST (5MM)  Result Date: 04/12/2021 CLINICAL DATA:  Altered level of consciousness. EXAM: CT HEAD WITHOUT CONTRAST TECHNIQUE: Contiguous axial images were obtained from the base of the skull through the vertex without intravenous contrast. COMPARISON:  Brain MRI 02/06/2020 FINDINGS: Brain: No evidence of acute infarction, hemorrhage,  hydrocephalus, extra-axial collection or mass lesion/mass effect. Central predominant cerebral volume loss with mild chronic small vessel ischemia. Vascular: No hyperdense vessel or unexpected calcification. Skull: Normal. Negative for fracture or focal lesion. Sinuses/Orbits: No acute finding. IMPRESSION: Senescent changes without acute finding. Electronically Signed   By: Monte Fantasia M.D.   On: 04/12/2021 07:15   DG Chest Port 1 View  Result Date: 04/12/2021 CLINICAL DATA:  Questionable sepsis EXAM: PORTABLE CHEST 1 VIEW COMPARISON:  12/04/2019 FINDINGS: Chronic cardiomegaly. Stable aortic and hilar contours when accounting for rotation. Prior aortic valve repair. Interstitial prominence. No focal opacity, effusion, pneumothorax, or Kerley lines IMPRESSION: 1. Vascular congestion versus hypoinflation. 2. Chronic cardiomegaly. 3. No clear pneumonia. Electronically Signed   By: Monte Fantasia M.D.   On: 04/12/2021 05:04   CT Angio Chest/Abd/Pel for Dissection W and/or Wo Contrast  Result Date: 04/12/2021 CLINICAL DATA:  Abdominal pain with aortic dissection suspected. Question graft leak or infection EXAM: CT ANGIOGRAPHY CHEST, ABDOMEN AND PELVIS TECHNIQUE: Non-contrast CT of the chest was initially obtained. Multidetector CT imaging through the chest, abdomen and pelvis was performed using the standard protocol during bolus administration of intravenous contrast. Multiplanar reconstructed images and MIPs were obtained and reviewed to evaluate the vascular anatomy. CONTRAST:  142m OMNIPAQUE IOHEXOL 350 MG/ML SOLN COMPARISON:  Chest CTA 12/05/2019 FINDINGS: CTA CHEST FINDINGS Cardiovascular: Preferential opacification of the thoracic aorta. No evidence of intramural hematoma or dissection. Normal heart size. No pericardial effusion. Aortic valve repair/aortic valve replacement. Ascending aortic diameter of 4.3 cm, unchanged. Mediastinum/Nodes: No hematoma or pneumomediastinum. Lungs/Pleura: Dependent  atelectasis. There is no edema, consolidation, effusion, or pneumothorax. Musculoskeletal: Remote anterior rib fractures. Review of the MIP images confirms the above findings. CTA ABDOMEN AND PELVIS FINDINGS VASCULAR Aorta: Diffuse atheromatous plaque.  No aneurysm or dissection. Celiac: Plaque at the ostium without flow limiting stenosis. SMA: Plaque at the ostium without flow limiting stenosis on reformats. No branch occlusion or dissection seen. Renals: Accessory right upper pole renal artery. Atheromatous plaque at the ostia with up to 50% narrowing at the left ostium. IMA: Patent Inflow: Diffuse atheromatous plaque. Mixed density plaque at the right common iliac bifurcation with at least moderate stenosis. Veins: Negative Review of the MIP images confirms the above findings. NON-VASCULAR Hepatobiliary: No focal liver abnormality.Cholecystectomy. No bile duct dilatation. Pancreas: Unremarkable. Spleen: Unremarkable. Adrenals/Urinary Tract: Negative adrenals. No hydronephrosis or ureteral stone. Small left upper and lower pole renal calculi. Unremarkable bladder. Stomach/Bowel: No obstruction. No appendicitis. Colonic diverticulosis. Lymphatic: No mass or adenopathy.  Reproductive:No pathologic findings. Other: Postoperative left groin.  Fatty left inguinal hernia. Musculoskeletal: No acute abnormalities. Lumbar spine degeneration especially affecting facets inferiorly. Review of the MIP images confirms the above findings. IMPRESSION: 1. No acute finding.  No evidence of acute aortic syndrome. 2. Aortic valve replacement. 3. 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 No progression since 2021. 4. Incidental findings are described above. Electronically Signed   By: Monte Fantasia M.D.   On: 04/12/2021 07:25    EKG: Independently reviewed. Sinus tach, no st elevations  Assessment/Plan Dyspnea ?COPD exacerbation     - place in obs, tele     - continue steroids, inhalers     - change from  cefepime to rocephin and check procal, RVP     - COVID/flu negative     - wean O2 as able     - check ABG  Acute metabolic encephalopathy     - wife believes his is now back to baseline mentation     - ?etiology hypoxia?     - CTH negative     - apparently he has a history of parkinsonism and is following with neurology  Multiple recent falls     - he has balance issues, coupled with DDD and parkinsonism     - he is supposed to use assistive devices at home; but is not always compliant     - he has had multiple recent falls (last was on Sunday)     - will have PT/OT take a look  DM2     - SSI, A1c, glucose check, DM diet  Thoracic aneurysm Aortic valve replacement HLD     - continue ASA, statin  Anxiety     - continue home regimen   Elevated troponin     - flat at 20 and 20     - EKG w/o st elevations     - no CP     - monitor  DVT prophylaxis: lovenox  Code Status: FULL  Family Communication: w/ wife at bedside  Consults called: None   Status is: Observation  The patient remains OBS appropriate and will d/c before 2 midnights.  Dispo: The patient is from: Home              Anticipated d/c is to: Home              Patient currently is not medically stable to d/c.   Difficult to place patient No  Time spent coordinating admission: 60 minutes  [facility]  If 7PM-7AM, please contact night-coverage [email or web address]  04/12/2021, 8:57 AM

## 2021-04-13 ENCOUNTER — Observation Stay (HOSPITAL_COMMUNITY): Payer: Medicare Other

## 2021-04-13 DIAGNOSIS — G8929 Other chronic pain: Secondary | ICD-10-CM | POA: Diagnosis present

## 2021-04-13 DIAGNOSIS — F419 Anxiety disorder, unspecified: Secondary | ICD-10-CM | POA: Diagnosis present

## 2021-04-13 DIAGNOSIS — E785 Hyperlipidemia, unspecified: Secondary | ICD-10-CM | POA: Diagnosis present

## 2021-04-13 DIAGNOSIS — M47816 Spondylosis without myelopathy or radiculopathy, lumbar region: Secondary | ICD-10-CM | POA: Diagnosis present

## 2021-04-13 DIAGNOSIS — M4316 Spondylolisthesis, lumbar region: Secondary | ICD-10-CM | POA: Diagnosis present

## 2021-04-13 DIAGNOSIS — Z6832 Body mass index (BMI) 32.0-32.9, adult: Secondary | ICD-10-CM | POA: Diagnosis not present

## 2021-04-13 DIAGNOSIS — R4182 Altered mental status, unspecified: Secondary | ICD-10-CM | POA: Diagnosis not present

## 2021-04-13 DIAGNOSIS — G20 Parkinson's disease: Secondary | ICD-10-CM | POA: Diagnosis present

## 2021-04-13 DIAGNOSIS — M545 Low back pain, unspecified: Secondary | ICD-10-CM | POA: Diagnosis not present

## 2021-04-13 DIAGNOSIS — J9601 Acute respiratory failure with hypoxia: Secondary | ICD-10-CM | POA: Diagnosis present

## 2021-04-13 DIAGNOSIS — Z20822 Contact with and (suspected) exposure to covid-19: Secondary | ICD-10-CM | POA: Diagnosis present

## 2021-04-13 DIAGNOSIS — G9341 Metabolic encephalopathy: Secondary | ICD-10-CM | POA: Diagnosis present

## 2021-04-13 DIAGNOSIS — E538 Deficiency of other specified B group vitamins: Secondary | ICD-10-CM | POA: Diagnosis present

## 2021-04-13 DIAGNOSIS — R296 Repeated falls: Secondary | ICD-10-CM | POA: Diagnosis present

## 2021-04-13 DIAGNOSIS — B9561 Methicillin susceptible Staphylococcus aureus infection as the cause of diseases classified elsewhere: Secondary | ICD-10-CM | POA: Diagnosis not present

## 2021-04-13 DIAGNOSIS — A419 Sepsis, unspecified organism: Secondary | ICD-10-CM | POA: Diagnosis not present

## 2021-04-13 DIAGNOSIS — I351 Nonrheumatic aortic (valve) insufficiency: Secondary | ICD-10-CM | POA: Diagnosis not present

## 2021-04-13 DIAGNOSIS — Z833 Family history of diabetes mellitus: Secondary | ICD-10-CM | POA: Diagnosis not present

## 2021-04-13 DIAGNOSIS — E1142 Type 2 diabetes mellitus with diabetic polyneuropathy: Secondary | ICD-10-CM | POA: Diagnosis present

## 2021-04-13 DIAGNOSIS — I712 Thoracic aortic aneurysm, without rupture: Secondary | ICD-10-CM | POA: Diagnosis present

## 2021-04-13 DIAGNOSIS — J441 Chronic obstructive pulmonary disease with (acute) exacerbation: Secondary | ICD-10-CM

## 2021-04-13 DIAGNOSIS — Z87891 Personal history of nicotine dependence: Secondary | ICD-10-CM | POA: Diagnosis not present

## 2021-04-13 DIAGNOSIS — A4101 Sepsis due to Methicillin susceptible Staphylococcus aureus: Secondary | ICD-10-CM | POA: Diagnosis present

## 2021-04-13 DIAGNOSIS — Z952 Presence of prosthetic heart valve: Secondary | ICD-10-CM | POA: Diagnosis not present

## 2021-04-13 DIAGNOSIS — Z8249 Family history of ischemic heart disease and other diseases of the circulatory system: Secondary | ICD-10-CM | POA: Diagnosis not present

## 2021-04-13 DIAGNOSIS — E669 Obesity, unspecified: Secondary | ICD-10-CM | POA: Diagnosis present

## 2021-04-13 DIAGNOSIS — M48061 Spinal stenosis, lumbar region without neurogenic claudication: Secondary | ICD-10-CM | POA: Diagnosis present

## 2021-04-13 DIAGNOSIS — R000 Tachycardia, unspecified: Secondary | ICD-10-CM | POA: Diagnosis present

## 2021-04-13 DIAGNOSIS — R7881 Bacteremia: Secondary | ICD-10-CM | POA: Diagnosis present

## 2021-04-13 DIAGNOSIS — F05 Delirium due to known physiological condition: Secondary | ICD-10-CM | POA: Diagnosis present

## 2021-04-13 LAB — CBC
HCT: 40.3 % (ref 39.0–52.0)
Hemoglobin: 13.6 g/dL (ref 13.0–17.0)
MCH: 31.1 pg (ref 26.0–34.0)
MCHC: 33.7 g/dL (ref 30.0–36.0)
MCV: 92.2 fL (ref 80.0–100.0)
Platelets: 151 10*3/uL (ref 150–400)
RBC: 4.37 MIL/uL (ref 4.22–5.81)
RDW: 14.6 % (ref 11.5–15.5)
WBC: 10.7 10*3/uL — ABNORMAL HIGH (ref 4.0–10.5)
nRBC: 0 % (ref 0.0–0.2)

## 2021-04-13 LAB — GLUCOSE, CAPILLARY
Glucose-Capillary: 185 mg/dL — ABNORMAL HIGH (ref 70–99)
Glucose-Capillary: 190 mg/dL — ABNORMAL HIGH (ref 70–99)
Glucose-Capillary: 142 mg/dL — ABNORMAL HIGH (ref 70–99)
Glucose-Capillary: 120 mg/dL — ABNORMAL HIGH (ref 70–99)

## 2021-04-13 LAB — COMPREHENSIVE METABOLIC PANEL
ALT: 22 U/L (ref 0–44)
AST: 18 U/L (ref 15–41)
Albumin: 2.9 g/dL — ABNORMAL LOW (ref 3.5–5.0)
Alkaline Phosphatase: 48 U/L (ref 38–126)
Anion gap: 7 (ref 5–15)
BUN: 19 mg/dL (ref 8–23)
CO2: 26 mmol/L (ref 22–32)
Calcium: 8.8 mg/dL — ABNORMAL LOW (ref 8.9–10.3)
Chloride: 105 mmol/L (ref 98–111)
Creatinine, Ser: 0.79 mg/dL (ref 0.61–1.24)
GFR, Estimated: >60 mL/min (ref >60)
Glucose, Bld: 195 mg/dL — ABNORMAL HIGH (ref 70–99)
Potassium: 3.6 mmol/L (ref 3.5–5.1)
Sodium: 138 mmol/L (ref 135–145)
Total Bilirubin: 0.6 mg/dL (ref 0.3–1.2)
Total Protein: 5.8 g/dL — ABNORMAL LOW (ref 6.5–8.1)

## 2021-04-13 LAB — ECHOCARDIOGRAM COMPLETE
AR max vel: 1.05 cm2
AV Area VTI: 1.16 cm2
AV Area mean vel: 1.08 cm2
AV Mean grad: 19 mmHg
AV Peak grad: 35.3 mmHg
Ao pk vel: 2.97 m/s
Area-P 1/2: 3.16 cm2
Calc EF: 61.2 %
S' Lateral: 3.8 cm
Single Plane A2C EF: 65.4 %
Single Plane A4C EF: 56.1 %

## 2021-04-13 MED ORDER — HALOPERIDOL 2 MG PO TABS
2.0000 mg | ORAL_TABLET | Freq: Once | ORAL | Status: DC
  Filled 2021-04-13: qty 1

## 2021-04-13 MED ORDER — SODIUM CHLORIDE 0.9 % IV SOLN
INTRAVENOUS | Status: DC | PRN
  Administered 2021-04-13: 250 mL via INTRAVENOUS

## 2021-04-13 MED ORDER — HALOPERIDOL LACTATE 5 MG/ML IJ SOLN: 2.0000 mg | Freq: Once | INTRAMUSCULAR | Status: DC

## 2021-04-13 MED ORDER — IPRATROPIUM-ALBUTEROL 0.5-2.5 (3) MG/3ML IN SOLN: 3.0000 mL | Freq: Two times a day (BID) | RESPIRATORY_TRACT | Status: DC

## 2021-04-13 MED ORDER — CEFAZOLIN SODIUM-DEXTROSE 2-4 GM/100ML-% IV SOLN
2.0000 g | INTRAVENOUS | Status: AC
  Administered 2021-04-13: 2 g via INTRAVENOUS
  Filled 2021-04-13: qty 100

## 2021-04-13 MED ORDER — CEFAZOLIN SODIUM-DEXTROSE 2-4 GM/100ML-% IV SOLN
2.0000 g | Freq: Three times a day (TID) | INTRAVENOUS | Status: DC
  Administered 2021-04-13 – 2021-04-20 (×19): 2 g via INTRAVENOUS
  Filled 2021-04-13 (×20): qty 100

## 2021-04-13 MED ORDER — METOCLOPRAMIDE HCL 5 MG/ML IJ SOLN
5.0000 mg | Freq: Once | INTRAMUSCULAR | Status: AC
  Administered 2021-04-13: 5 mg via INTRAVENOUS
  Filled 2021-04-13: qty 2

## 2021-04-13 MED ORDER — IPRATROPIUM-ALBUTEROL 0.5-2.5 (3) MG/3ML IN SOLN
3.0000 mL | Freq: Four times a day (QID) | RESPIRATORY_TRACT | Status: DC | PRN
  Administered 2021-04-13 – 2021-04-15 (×3): 3 mL via RESPIRATORY_TRACT
  Filled 2021-04-13 (×3): qty 3

## 2021-04-13 NOTE — Progress Notes (Signed)
[facility]  PROGRESS NOTE  WINFORD WACHA [id number] DOB: 14-Jan-1946 DOA: 04/12/2021 PCP: Finis Bud, MD   Brief HPI:   75 year old male with medical history of tobacco abuse, COPD, diabetes mellitus type 2 presented with fever and chills with shortness of breath and altered mental status.  Per wife, patient woke up middle of night was breathing heavily, shivering.  She checked his temperature which was 100.8.  EMS was called.  In the ED he was found to be tachycardic, tachypneic.  CT abdomen/pelvis was negative, mild leukocytosis, was found to be toxemic on room air. Patient was started on antibiotics and steroids for possible COPD exacerbation.   Subjective   Patient seen and examined, blood cultures x2 are growing MSSA.  Patient started on IV cefazolin.  Denies shortness of breath.  Currently on 2 L pulmonary of oxygen   Assessment/Plan:    MSSA bacteremia -Patient started on IV cefazolin. -No clear source -ID recommends TEE, as patient has history of aortic valve replacement in 2013 -MRI lumbar spine only showed multilevel spondylosis, no discitis or abscess. -We will consult cardiology for TEE  Metabolic encephalopathy -Resolved -Back to baseline -Secondary to above  Dyspnea -Resolved; likely was tachypneic from bacteremia -We will discontinue prednisone -Continue DuoNeb nebulization every 6 hours as needed  Recent falls -PT evaluation obtained -Recommended to go to rehab  Diabetes mellitus type 2 -Continue sliding scale insulin with NovoLog -CBG well controlled   Scheduled medications:    buPROPion  300 mg Oral Daily   enoxaparin (LOVENOX) injection  40 mg Subcutaneous Q24H   insulin aspart  0-15 Units Subcutaneous TID WC   insulin aspart  0-5 Units Subcutaneous QHS   ipratropium-albuterol  3 mL Nebulization BID   [START ON 04/14/2021] predniSONE  40 mg Oral Q breakfast         Data Reviewed:   CBG:  Recent Labs  Lab  04/12/21 1608 04/12/21 2041 04/13/21 0756 04/13/21 1223  GLUCAP 280* 241* 185* 190*    SpO2: (!) 87 % O2 Flow Rate (L/min): 2 L/min    Vitals:   04/13/21 0416 04/13/21 0546 04/13/21 0806 04/13/21 1426  BP: 108/72 133/73  107/78  Pulse: 62 (!) 55  88  Resp: '20 16  17  '$ Temp: 98.5 F (36.9 C) 97.8 F (36.6 C)  (!) 97.4 F (36.3 C)  TempSrc: Oral Oral  Oral  SpO2:  93% 92% (!) 87%     Intake/Output Summary (Last 24 hours) at 04/13/2021 1444 Last data filed at 04/13/2021 0900 Gross per 24 hour  Intake 576 ml  Output 600 ml  Net -24 ml    09/01 1901 - 09/03 0700 In: [id number] [P.O.:476] Out: 600 [Urine:600]  There were no vitals filed for this visit.  CBC:  Recent Labs  Lab 04/12/21 0423 04/12/21 0959 04/13/21 0556  WBC 11.4* 11.6* 10.7*  HGB 16.9 15.8 13.6  HCT 52.1* 47.6 40.3  PLT 187 162 151  MCV 95.9 94.8 92.2  MCH 31.1 31.5 31.1  MCHC 32.4 33.2 33.7  RDW 15.0 15.0 14.6  LYMPHSABS 0.9  --   --   MONOABS 0.7  --   --   EOSABS 0.0  --   --   BASOSABS 0.1  --   --     Complete metabolic panel:  Recent Labs  Lab 04/12/21 0423 04/12/21 0616 04/12/21 0959 04/13/21 0556  NA 137  --   --  138  K 4.3  --   --  3.6  CL 100  --   --  105  CO2 29  --   --  26  GLUCOSE 123*  --   --  195*  BUN 15  --   --  19  CREATININE 1.11  --  0.96 0.79  CALCIUM 8.8*  --   --  8.8*  AST 16  --   --  18  ALT 18  --   --  22  ALKPHOS 81  --   --  48  BILITOT 0.7  --   --  0.6  ALBUMIN 3.8  --   --  2.9*  LATICACIDVEN 1.5 1.0  --   --   INR 0.8  --   --   --   HGBA1C  --   --  7.9*  --     No results for input(s): LIPASE, AMYLASE in the last 168 hours.  Recent Labs  Lab 04/12/21 0705  SARSCOV2NAA NEGATIVE    ------------------------------------------------------------------------------------------------------------------ No results for input(s): CHOL, HDL, LDLCALC, TRIG, CHOLHDL, LDLDIRECT in the last 72 hours.  Lab Results  Component Value Date   HGBA1C 7.9  (H) 04/12/2021   ------------------------------------------------------------------------------------------------------------------ No results for input(s): TSH, T4TOTAL, T3FREE, THYROIDAB in the last 72 hours.  Invalid input(s): FREET3 ------------------------------------------------------------------------------------------------------------------ No results for input(s): VITAMINB12, FOLATE, FERRITIN, TIBC, IRON, RETICCTPCT in the last 72 hours.  Coagulation profile Recent Labs  Lab 04/12/21 0423  INR 0.8   No results for input(s): DDIMER in the last 72 hours.  Cardiac Enzymes No results for input(s): CKTOTAL, CKMB, CKMBINDEX, TROPONINI in the last 168 hours.  ------------------------------------------------------------------------------------------------------------------    Component Value Date/Time   BNP 74.6 12/04/2019 2057     Antibiotics: Anti-infectives (From admission, onward)    Start     Dose/Rate Route Frequency Ordered Stop   04/12/21 1000  cefTRIAXone (ROCEPHIN) 1 g in sodium chloride 0.9 % 100 mL IVPB        1 g 200 mL/hr over 30 Minutes Intravenous Every 24 hours 04/12/21 0958 04/17/21 0959   04/12/21 0445  ceFEPIme (MAXIPIME) 2 g in sodium chloride 0.9 % 100 mL IVPB        2 g 200 mL/hr over 30 Minutes Intravenous  Once 04/12/21 0436 04/12/21 0520        Radiology Reports  CT HEAD WO CONTRAST (5MM)  Result Date: 04/12/2021 CLINICAL DATA:  Altered level of consciousness. EXAM: CT HEAD WITHOUT CONTRAST TECHNIQUE: Contiguous axial images were obtained from the base of the skull through the vertex without intravenous contrast. COMPARISON:  Brain MRI 02/06/2020 FINDINGS: Brain: No evidence of acute infarction, hemorrhage, hydrocephalus, extra-axial collection or mass lesion/mass effect. Central predominant cerebral volume loss with mild chronic small vessel ischemia. Vascular: No hyperdense vessel or unexpected calcification. Skull: Normal. Negative for  fracture or focal lesion. Sinuses/Orbits: No acute finding. IMPRESSION: Senescent changes without acute finding. Electronically Signed   By: Monte Fantasia M.D.   On: 04/12/2021 07:15   DG Chest Port 1 View  Result Date: 04/12/2021 CLINICAL DATA:  Questionable sepsis EXAM: PORTABLE CHEST 1 VIEW COMPARISON:  12/04/2019 FINDINGS: Chronic cardiomegaly. Stable aortic and hilar contours when accounting for rotation. Prior aortic valve repair. Interstitial prominence. No focal opacity, effusion, pneumothorax, or Kerley lines IMPRESSION: 1. Vascular congestion versus hypoinflation. 2. Chronic cardiomegaly. 3. No clear pneumonia. Electronically Signed   By: Monte Fantasia M.D.   On: 04/12/2021 05:04   ECHOCARDIOGRAM COMPLETE  Result Date: 04/13/2021    ECHOCARDIOGRAM REPORT  Patient Name:   AVIAN GASTER Date of Exam: 04/13/2021 Medical Rec #:  [id number]         Height:       69.0 in Accession #:    [id number]        Weight:       215.0 lb Date of Birth:  [birth year]         BSA:          2.130 m Patient Age:    33 years          BP:           133/73 mmHg Patient Gender: M                 HR:           74 bpm. Exam Location:  Inpatient Procedure: 2D Echo, Cardiac Doppler and Color Doppler                              MODIFIED REPORT: This report was modified by Cherlynn Kaiser MD on 04/13/2021 due to revision.  Indications:     Bacteremia  History:         Patient has prior history of Echocardiogram examinations, most                  recent 09/01/2018. Risk Factors:Diabetes and Dyslipidemia.                  Aortic Valve: 21 mm pericardial valve is present in the aortic                  position. Procedure Date: Dec 2013.  Sonographer:     [place] Referring Phys:  [id number] Rosiland Oz Diagnosing Phys: Cherlynn Kaiser MD  Sonographer Comments: Patient is morbidly obese, Technically challenging study due to limited acoustic windows, suboptimal parasternal window, suboptimal apical window and suboptimal  subcostal window. Image acquisition challenging due to patient body habitus. IMPRESSIONS  1. Image quality is insufficient to exclude valvular vegetations. Consider TEE if clinically indicated.  2. Left ventricular ejection fraction, by estimation, is 60 to 65%. The left ventricle has normal function. Left ventricular endocardial border not optimally defined to evaluate regional wall motion. There is mild left ventricular hypertrophy. Left ventricular diastolic parameters are consistent with Grade I diastolic dysfunction (impaired relaxation).  3. Right ventricular systolic function is mildly reduced. The right ventricular size is normal. Tricuspid regurgitation signal is inadequate for assessing PA pressure.  4. The mitral valve is grossly normal. Trivial mitral valve regurgitation. No evidence of mitral stenosis.  5. S/p redo surgical aortic valve replacement. Calculations most consistent with patient prosthesis mismatch. EOA 1.16 cm2, iEOA 0.54, DI 0.37, AT 71 msec. . The aortic valve has been repaired/replaced. Aortic valve regurgitation is trivial. 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 Procedure Date: Dec 2013. Aortic valve mean gradient measures 19.0 mmHg.  6. Aortic dilatation noted. There is mild dilatation of the ascending aorta, measuring 44 mm.  7. The inferior vena cava is normal in size with greater than 50% respiratory variability, suggesting right atrial pressure of 3 mmHg. Comparison(s): Compared to report of echo from 6/22 at outside hospital, gradient through aortic valve is known and stable. FINDINGS  Left Ventricle: Left ventricular ejection fraction, by estimation, is 60 to 65%. The left ventricle has normal function. Left ventricular endocardial border not optimally defined  to evaluate regional wall motion. The left ventricular internal cavity size was normal in size. There is mild left ventricular hypertrophy. Left ventricular diastolic parameters are consistent  with Grade I diastolic dysfunction (impaired relaxation). Right Ventricle: The right ventricular size is normal. Right vetricular wall thickness was not well visualized. Right ventricular systolic function is mildly reduced. Tricuspid regurgitation signal is inadequate for assessing PA pressure. Left Atrium: Left atrial size was normal in size. Right Atrium: Right atrial size was normal in size. Pericardium: There is no evidence of pericardial effusion. Presence of pericardial fat pad. Mitral Valve: The mitral valve is grossly normal. Trivial mitral valve regurgitation. No evidence of mitral valve stenosis. MV peak gradient, 4.2 mmHg. The mean mitral valve gradient is 2.0 mmHg. Tricuspid Valve: The tricuspid valve is grossly normal. Tricuspid valve regurgitation is trivial. Aortic Valve: S/p redo surgical aortic valve replacement. Calculations most consistent with patient prosthesis mismatch. EOA 1.16 cm2, iEOA 0.54, DI 0.37, AT 71 msec. The aortic valve has been repaired/replaced. Aortic valve regurgitation is trivial. Aortic valve mean gradient measures 19.0 mmHg. Aortic valve peak gradient measures 35.3 mmHg. Aortic valve area, by VTI measures 1.16 cm. 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 Procedure Date: Dec 2013. Pulmonic Valve: The pulmonic valve was not well visualized. Pulmonic valve regurgitation is trivial. Aorta: Aortic dilatation noted. There is mild dilatation of the ascending aorta, measuring 44 mm. Venous: The inferior vena cava is normal in size with greater than 50% respiratory variability, suggesting right atrial pressure of 3 mmHg. IAS/Shunts: The interatrial septum was not well visualized.  LEFT VENTRICLE PLAX 2D LVIDd:         4.50 cm     Diastology LVIDs:         3.80 cm     LV e' medial:    4.90 cm/s LV PW:         1.20 cm     LV E/e' medial:  15.1 LV IVS:        1.10 cm     LV e' lateral:   6.42 cm/s LVOT diam:     2.00 cm     LV E/e' lateral: 11.5 LV SV:          57 LV SV Index:   27 LVOT Area:     3.14 cm  LV Volumes (MOD) LV vol d, MOD A2C: 52.3 ml LV vol d, MOD A4C: 57.9 ml LV vol s, MOD A2C: 18.1 ml LV vol s, MOD A4C: 25.4 ml LV SV MOD A2C:     34.2 ml LV SV MOD A4C:     57.9 ml LV SV MOD BP:      33.7 ml RIGHT VENTRICLE RV Basal diam:  3.30 cm RV Mid diam:    2.10 cm TAPSE (M-mode): 1.3 cm LEFT ATRIUM             Index       RIGHT ATRIUM           Index LA Vol (A2C):   28.3 ml 13.28 ml/m RA Area:     20.30 cm LA Vol (A4C):   52.2 ml 24.50 ml/m RA Volume:   57.00 ml  26.75 ml/m LA Biplane Vol: 39.7 ml 18.63 ml/m  AORTIC VALVE                    PULMONIC VALVE AV Area (Vmax):    1.05 cm     PV  Vmax:       0.92 m/s AV Area (Vmean):   1.08 cm     PV Vmean:      64.000 cm/s AV Area (VTI):     1.16 cm     PV VTI:        0.157 m AV Vmax:           297.00 cm/s  PV Peak grad:  3.4 mmHg AV Vmean:          197.000 cm/s PV Mean grad:  2.0 mmHg AV VTI:            0.489 m AV Peak Grad:      35.3 mmHg AV Mean Grad:      19.0 mmHg LVOT Vmax:         99.50 cm/s LVOT Vmean:        67.800 cm/s LVOT VTI:          0.180 m LVOT/AV VTI ratio: 0.37  AORTA Ao Root diam: 3.80 cm Ao Asc diam:  4.40 cm MITRAL VALVE MV Area (PHT): 3.16 cm    SHUNTS MV Peak grad:  4.2 mmHg    Systemic VTI:  0.18 m MV Mean grad:  2.0 mmHg    Systemic Diam: 2.00 cm MV Vmax:       1.02 m/s MV Vmean:      56.4 cm/s MV Decel Time: 240 msec MV E velocity: 73.80 cm/s MV A velocity: 98.50 cm/s MV E/A ratio:  0.75 Cherlynn Kaiser MD Electronically signed by Cherlynn Kaiser MD Signature Date/Time: 04/13/2021/1:24:22 PM    Final (Updated)    CT Angio Chest/Abd/Pel for Dissection W and/or Wo Contrast  Result Date: 04/12/2021 CLINICAL DATA:  Abdominal pain with aortic dissection suspected. Question graft leak or infection EXAM: CT ANGIOGRAPHY CHEST, ABDOMEN AND PELVIS TECHNIQUE: Non-contrast CT of the chest was initially obtained. Multidetector CT imaging through the chest, abdomen and pelvis was performed using the  standard protocol during bolus administration of intravenous contrast. Multiplanar reconstructed images and MIPs were obtained and reviewed to evaluate the vascular anatomy. CONTRAST:  19m OMNIPAQUE IOHEXOL 350 MG/ML SOLN COMPARISON:  Chest CTA 12/05/2019 FINDINGS: CTA CHEST FINDINGS Cardiovascular: Preferential opacification of the thoracic aorta. No evidence of intramural hematoma or dissection. Normal heart size. No pericardial effusion. Aortic valve repair/aortic valve replacement. Ascending aortic diameter of 4.3 cm, unchanged. Mediastinum/Nodes: No hematoma or pneumomediastinum. Lungs/Pleura: Dependent atelectasis. There is no edema, consolidation, effusion, or pneumothorax. Musculoskeletal: Remote anterior rib fractures. Review of the MIP images confirms the above findings. CTA ABDOMEN AND PELVIS FINDINGS VASCULAR Aorta: Diffuse atheromatous plaque.  No aneurysm or dissection. Celiac: Plaque at the ostium without flow limiting stenosis. SMA: Plaque at the ostium without flow limiting stenosis on reformats. No branch occlusion or dissection seen. Renals: Accessory right upper pole renal artery. Atheromatous plaque at the ostia with up to 50% narrowing at the left ostium. IMA: Patent Inflow: Diffuse atheromatous plaque. Mixed density plaque at the right common iliac bifurcation with at least moderate stenosis. Veins: Negative Review of the MIP images confirms the above findings. NON-VASCULAR Hepatobiliary: No focal liver abnormality.Cholecystectomy. No bile duct dilatation. Pancreas: Unremarkable. Spleen: Unremarkable. Adrenals/Urinary Tract: Negative adrenals. No hydronephrosis or ureteral stone. Small left upper and lower pole renal calculi. Unremarkable bladder. Stomach/Bowel: No obstruction. No appendicitis. Colonic diverticulosis. Lymphatic: No mass or adenopathy. Reproductive:No pathologic findings. Other: Postoperative left groin.  Fatty left inguinal hernia. Musculoskeletal: No acute abnormalities.  Lumbar spine degeneration especially affecting facets inferiorly. Review  of the MIP images confirms the above findings. IMPRESSION: 1. No acute finding.  No evidence of acute aortic syndrome. 2. Aortic valve replacement. 3. 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 No progression since 2021. 4. Incidental findings are described above. Electronically Signed   By: Monte Fantasia M.D.   On: 04/12/2021 07:25      DVT prophylaxis: Lovenox  Code Status: Full code  Family Communication: No family at bedside   Consultants: Infectious disease  Procedures:     Objective    Physical Examination:   General-appears in no acute distress Heart-S1-S2, regular, no murmur auscultated Lungs-clear to auscultation bilaterally, no wheezing or crackles auscultated Abdomen-soft, nontender, no organomegaly Extremities-no edema in the lower extremities Neuro-alert, oriented x3, no focal deficit noted  Status is: Inpatient  Dispo: The patient is from: Home              Anticipated d/c is to: Skilled nursing facility              Anticipated d/c date is: 04/17/2021              Patient currently not stable for discharge  Barrier to discharge-work-up for MSSA bacteremia  COVID-19 Labs  No results for input(s): DDIMER, FERRITIN, LDH, CRP in the last 72 hours.  Lab Results  Component Value Date   [place] NEGATIVE 04/12/2021    Microbiology  Recent Results (from the past 240 hour(s))  Blood culture (routine x 2)     Status: None (Preliminary result)   Collection Time: 04/12/21  4:23 AM   Specimen: BLOOD  Result Value Ref Range Status   Specimen Description   Final    BLOOD BLOOD LEFT HAND Performed at [facility], [place] [address] [place], [place] [address]    Special Requests   Final    BOTTLES DRAWN AEROBIC AND ANAEROBIC Blood Culture adequate volume Performed at [place] [address] [place], [place] [address]     Culture  Setup Time   Final    GRAM POSITIVE COCCI IN CLUSTERS IN BOTH AEROBIC AND ANAEROBIC BOTTLES CRITICAL VALUE NOTED.  VALUE IS CONSISTENT WITH PREVIOUSLY REPORTED AND CALLED VALUE.    Culture   Final    NO GROWTH < 24 HOURS Performed at [facility], [place] [address], [place], [place] [address]    Report Status PENDING  Incomplete  Blood culture (routine x 2)     Status: None (Preliminary result)   Collection Time: 04/12/21  4:24 AM   Specimen: Right Antecubital; Blood  Result Value Ref Range Status   Specimen Description   Final    RIGHT ANTECUBITAL Performed at [facility] [address] [place], [place] [address]    Special Requests   Final    BOTTLES DRAWN AEROBIC AND ANAEROBIC Blood Culture results may not be optimal due to an inadequate volume of blood received in culture bottles Performed at [place] [address] [place], [place] [address]    Culture  Setup Time   Final    GRAM POSITIVE COCCI IN CLUSTERS IN BOTH AEROBIC AND ANAEROBIC BOTTLES CRITICAL RESULT CALLED TO, READ BACK BY AND VERIFIED WITH: L POINDEXTER,PHARMD'@2250'$  04/12/21 [place] Performed at [facility], [place] [address] [place], [place] [address]    Culture [facility] POSITIVE COCCI  Final   Report Status PENDING  Incomplete  Blood Culture ID Panel (Reflexed)     Status:  Abnormal   Collection Time: 04/12/21  4:24 AM  Result Value Ref Range Status   Enterococcus faecalis NOT DETECTED NOT DETECTED Final   Enterococcus Faecium NOT DETECTED NOT DETECTED Final   Listeria monocytogenes NOT DETECTED NOT DETECTED Final   Staphylococcus species DETECTED (A) NOT DETECTED Final    Comment: CRITICAL RESULT CALLED TO, READ BACK BY AND VERIFIED WITH: L POINDEXTER,PHARMD'@2250'$  04/12/21 [place]    Staphylococcus aureus (BCID) DETECTED (A) NOT DETECTED Final    Comment: CRITICAL RESULT CALLED TO, READ BACK BY AND VERIFIED WITH: L POINDEXTER,PHARMD'@2250'$  04/12/21 [place]     Staphylococcus epidermidis NOT DETECTED NOT DETECTED Final   Staphylococcus lugdunensis NOT DETECTED NOT DETECTED Final   Streptococcus species NOT DETECTED NOT DETECTED Final   Streptococcus agalactiae NOT DETECTED NOT DETECTED Final   Streptococcus pneumoniae NOT DETECTED NOT DETECTED Final   Streptococcus pyogenes NOT DETECTED NOT DETECTED Final   A.calcoaceticus-baumannii NOT DETECTED NOT DETECTED Final   Bacteroides fragilis NOT DETECTED NOT DETECTED Final   Enterobacterales NOT DETECTED NOT DETECTED Final   Enterobacter cloacae complex NOT DETECTED NOT DETECTED Final   Escherichia coli NOT DETECTED NOT DETECTED Final   Klebsiella aerogenes NOT DETECTED NOT DETECTED Final   Klebsiella oxytoca NOT DETECTED NOT DETECTED Final   Klebsiella pneumoniae NOT DETECTED NOT DETECTED Final   Proteus species NOT DETECTED NOT DETECTED Final   Salmonella species NOT DETECTED NOT DETECTED Final   Serratia marcescens NOT DETECTED NOT DETECTED Final   Haemophilus influenzae NOT DETECTED NOT DETECTED Final   Neisseria meningitidis NOT DETECTED NOT DETECTED Final   Pseudomonas aeruginosa NOT DETECTED NOT DETECTED Final   Stenotrophomonas maltophilia NOT DETECTED NOT DETECTED Final   Candida albicans NOT DETECTED NOT DETECTED Final   Candida auris NOT DETECTED NOT DETECTED Final   Candida glabrata NOT DETECTED NOT DETECTED Final   Candida krusei NOT DETECTED NOT DETECTED Final   Candida parapsilosis NOT DETECTED NOT DETECTED Final   Candida tropicalis NOT DETECTED NOT DETECTED Final   Cryptococcus neoformans/gattii NOT DETECTED NOT DETECTED Final   Meth resistant mecA/C and MREJ NOT DETECTED NOT DETECTED Final    Comment: Performed at [facility], [address] [place], [place] [address]  Resp Panel by RT-PCR (Flu A&B, Covid) Nasopharyngeal Swab     Status: None   Collection Time: 04/12/21  7:05 AM   Specimen: Nasopharyngeal Swab; Nasopharyngeal(NP) swabs in vial transport medium  Result  Value Ref Range Status   SARS Coronavirus 2 by RT PCR NEGATIVE NEGATIVE Final    Comment: (NOTE) SARS-CoV-2 target nucleic acids are NOT DETECTED.  The SARS-CoV-2 RNA is generally detectable in upper respiratory specimens during the acute phase of infection. The lowest concentration of SARS-CoV-2 viral copies this assay can detect is 138 copies/mL. A negative result does not preclude SARS-Cov-2 infection and should not be used as the sole basis for treatment or other patient management decisions. A negative result may occur with  improper specimen collection/handling, submission of specimen other than nasopharyngeal swab, presence of viral mutation(s) within the areas targeted by this assay, and inadequate number of viral copies(<138 copies/mL). A negative result must be combined with clinical observations, patient history, and epidemiological information. The expected result is Negative.  Fact Sheet for Patients:  EntrepreneurPulse.com.au  Fact Sheet for Healthcare Providers:  IncredibleEmployment.be  This test is no t yet approved or cleared by the [place] FDA and  has been authorized for detection and/or diagnosis of SARS-CoV-2 by FDA under an Emergency  Use Authorization (EUA). This EUA will remain  in effect (meaning this test can be used) for the duration of the COVID-19 declaration under Section 564(b)(1) of the Act, 21 U.S.C.section 360bbb-3(b)(1), unless the authorization is terminated  or revoked sooner.       Influenza A by PCR NEGATIVE NEGATIVE Final   Influenza B by PCR NEGATIVE NEGATIVE Final    Comment: (NOTE) The Xpert Xpress SARS-CoV-2/FLU/RSV plus assay is intended as an aid in the diagnosis of influenza from Nasopharyngeal swab specimens and should not be used as a sole basis for treatment. Nasal washings and aspirates are unacceptable for Xpert Xpress SARS-CoV-2/FLU/RSV testing.  Fact Sheet for  Patients: EntrepreneurPulse.com.au  Fact Sheet for Healthcare Providers: IncredibleEmployment.be  This test is not yet approved or cleared by the [place] FDA and has been authorized for detection and/or diagnosis of SARS-CoV-2 by FDA under an Emergency Use Authorization (EUA). This EUA will remain in effect (meaning this test can be used) for the duration of the COVID-19 declaration under Section 564(b)(1) of the Act, 21 U.S.C. section 360bbb-3(b)(1), unless the authorization is terminated or revoked.  Performed at [facility], [place] [address], [place], [place] [address]   Respiratory (~20 pathogens) panel by PCR     Status: None   Collection Time: 04/12/21  9:59 AM   Specimen: Nasopharyngeal Swab; Respiratory  Result Value Ref Range Status   Adenovirus NOT DETECTED NOT DETECTED Final   Coronavirus 229E NOT DETECTED NOT DETECTED Final    Comment: (NOTE) The Coronavirus on the Respiratory Panel, DOES NOT test for the novel  Coronavirus (2019 nCoV)    Coronavirus HKU1 NOT DETECTED NOT DETECTED Final   Coronavirus NL63 NOT DETECTED NOT DETECTED Final   Coronavirus OC43 NOT DETECTED NOT DETECTED Final   Metapneumovirus NOT DETECTED NOT DETECTED Final   Rhinovirus / Enterovirus NOT DETECTED NOT DETECTED Final   Influenza A NOT DETECTED NOT DETECTED Final   Influenza B NOT DETECTED NOT DETECTED Final   Parainfluenza Virus 1 NOT DETECTED NOT DETECTED Final   Parainfluenza Virus 2 NOT DETECTED NOT DETECTED Final   Parainfluenza Virus 3 NOT DETECTED NOT DETECTED Final   Parainfluenza Virus 4 NOT DETECTED NOT DETECTED Final   Respiratory Syncytial Virus NOT DETECTED NOT DETECTED Final   Bordetella pertussis NOT DETECTED NOT DETECTED Final   Bordetella Parapertussis NOT DETECTED NOT DETECTED Final   Chlamydophila pneumoniae NOT DETECTED NOT DETECTED Final   Mycoplasma pneumoniae NOT DETECTED NOT DETECTED Final    Comment:  Performed at [facility], [place]. [address], [place], [place] [address]             [facility] If 7PM-7AM, please contact night-coverage at [email or web address], Office  [phone]   04/13/2021, 2:44 PM  LOS: 0 days

## 2021-04-13 NOTE — Progress Notes (Signed)
*  PRELIMINARY RESULTS* Echocardiogram 2D Echocardiogram has been performed.  Luisa Hart RDCS 04/13/2021, 10:11 AM

## 2021-04-13 NOTE — Consult Note (Signed)
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 Please call with questions or concerns.  Thank you for the consult  Rosiland Oz, MD Infectious Disease Physician [facility] [address] [place], [place] [address] Phone: [phone]  Fax: [phone]  __________________________________________________________________________________________________________ HPI and Hospital Course: 75 year old male with a PMH of DM, COPD who presented to the ED on 9/2 with altered mental status.  Patient also had vomiting and shortness of breath/cough prior to presentation.  Per wife patient woke up in the middle of the night with heavy breathing, feeling cold and he was shaking. Patient was tachycardic/tachypneic/hypoxic upon arrival with concern for sepsis, started on broad-spectrum antibiotics.  Altered mental status seems to be somewhat improved during ED stay.  Patient was also agree given breathing treatment  and IV steroids for concern of COPD exacerbation  At ED afebrile, BC 11.4.  Lactic acid 1.5 Imagings as below Blood culture 9/2 2 x 2 sets positive for gram-positive cocci in clusters, identified by MSSA by [facility] ID  Per patient and wife, patient was in his normal state of health until 1 day prior to presentation except some Cough. Had fever same day. Denies any hardware in his body except AVR. Denies any peripheral joint/swelling and tenderness. Has chronic back pain, denies any recent worsening. He has chronic skin changes in his bilateral upper extremities  ROS: General- Denies chills, loss of appetite and loss of weight, fevers + HEENT - Denies headache, blurry vision, neck pain, sinus pain Chest - Denies any chest pain, SOB, cough + CVS- Denies any dizziness/lightheadedness, syncopal attacks, palpitations Abdomen- Denies any nausea, vomiting, abdominal pain, hematochezia and diarrhea Neuro - Denies any focal weakness, numbness, tingling sensation, generalised weakness + Psych - Denies any changes in mood irritability or depressive symptoms GU- Denies any burning, dysuria, hematuria or increased frequency of urination Skin - denies any rashes/lesions, chronic skin changes+ MSK - denies any joint pain/swelling or restricted ROM .  Past Medical History:  Diagnosis Date   Anxiety    B12 deficiency    Back pain    Diabetes  mellitus without complication ([place])    Hyperlipidemia    Testosterone insufficiency    Past Surgical History:  Procedure Laterality Date   AORTIC VALVE REPLACEMENT  2013   redo bioprosthetic valve at [facility], Dr Mauricio Po   CHOLECYSTECTOMY     STRABISMUS SURGERY     TEE WITHOUT CARDIOVERSION N/A 09/07/2018   Procedure: TRANSESOPHAGEAL ECHOCARDIOGRAM (TEE);  Surgeon: Thayer Headings, MD;  Location: [facility] ENDOSCOPY;  Service: Cardiovascular;  Laterality: N/A;     Scheduled Meds:  buPROPion  300 mg Oral Daily   enoxaparin (LOVENOX) injection  40 mg Subcutaneous Q24H    insulin aspart  0-15 Units Subcutaneous TID WC   insulin aspart  0-5 Units Subcutaneous QHS   ipratropium-albuterol  3 mL Nebulization Q6H   methylPREDNISolone (SOLU-MEDROL) injection  60 mg Intravenous Q12H   Followed by   Derrill Memo ON 04/14/2021] predniSONE  40 mg Oral Q breakfast   Continuous Infusions:  cefTRIAXone (ROCEPHIN)  IV 1 g (04/12/21 1145)   PRN Meds:.acetaminophen **OR** acetaminophen, HYDROcodone-acetaminophen, ondansetron **OR** ondansetron (ZOFRAN) IV, senna-docusate  No Known Allergies  Social History   Socioeconomic History   Marital status: Married    Spouse name: Not on file   Number of children: Not on file   Years of education: Not on file   Highest education level: Not on file  Occupational History   Not on file  Tobacco Use   Smoking status: Former    Packs/day: 1.50    Years: 55.00    Pack years: 82.50    Types: Cigarettes    Quit date: 08/24/2018    Years since quitting: 2.6   Smokeless tobacco: Former  Scientific laboratory technician Use: Never used  Substance and Sexual Activity   Alcohol use: Not Currently   Drug use: Never   Sexual activity: Not on file  Other Topics Concern   Not on file  Social History Narrative   Not on file   Social Determinants of Health   Financial Resource Strain: Not on file  Food Insecurity: Not on file  Transportation Needs: Not on file  Physical Activity: Not on file  Stress: Not on file  Social Connections: Not on file  Intimate Partner Violence: Not on file   Family History  Problem Relation Age of Onset   Hypertension Mother    Diabetes Mother     Vitals BP 133/73 (BP Location: Left Arm)   Pulse (!) 55   Temp 97.8 F (36.6 C) (Oral)   Resp 16   SpO2 93%    Physical Exam Constitutional:  lying in bed and looks frail, not in acute distress     Comments:   Cardiovascular:     Rate and Rhythm: Normal rate and regular rhythm.     Heart sounds: systolic murmur+  Pulmonary:     Effort: Pulmonary  effort is normal.     Comments: clear lung sounds bilaterally   Abdominal:     Palpations: Abdomen is soft.     Tenderness: Non tender and non distended   Musculoskeletal:        General: No swelling or tenderness. No spine tenderness   Skin:    Comments: flushing o face ( chronic per patient) and chronic skin changes in upper extremities bilaterally   Neurological:     General: No focal deficit present. Awake, alert and oriented   Psychiatric:        Mood and Affect: Mood normal. Calm and cooperative   Pertinent  Microbiology Results for orders placed or performed during the hospital encounter of 04/12/21  Blood culture (routine x 2)     Status: None (Preliminary result)   Collection Time: 04/12/21  4:23 AM   Specimen: BLOOD  Result Value Ref Range Status   Specimen Description   Final    BLOOD BLOOD LEFT HAND Performed at [facility] [address] [place], [place] [address]    Special Requests   Final    BOTTLES DRAWN AEROBIC AND ANAEROBIC Blood Culture adequate volume Performed at [facility] [address], [place], [place] [address]    Culture  Setup Time   Final    GRAM POSITIVE COCCI IN CLUSTERS IN BOTH AEROBIC AND ANAEROBIC BOTTLES CRITICAL VALUE NOTED.  VALUE IS CONSISTENT WITH PREVIOUSLY REPORTED AND CALLED VALUE. Performed at [facility], [place] [address] [place], [place] [address]    Culture PENDING  Incomplete   Report Status PENDING  Incomplete  Blood culture (routine x 2)     Status: None (Preliminary result)   Collection Time: 04/12/21  4:24 AM   Specimen: Right Antecubital; Blood  Result Value Ref Range Status   Specimen Description   Final    RIGHT ANTECUBITAL Performed at [facility] [address], [place], [place] [address]    Special Requests   Final    BOTTLES DRAWN AEROBIC AND ANAEROBIC Blood Culture results may not be optimal due to an inadequate volume of blood received in  culture bottles Performed at [facility] [address], [place], [place] [address]    Culture  Setup Time   Final    GRAM POSITIVE COCCI IN CLUSTERS IN BOTH AEROBIC AND ANAEROBIC BOTTLES CRITICAL RESULT CALLED TO, READ BACK BY AND VERIFIED WITH: L POINDEXTER,PHARMD'@2250'$  04/12/21 [place] Performed at [facility], [place] [address], [place], [place] [address]    Culture PENDING  Incomplete   Report Status PENDING  Incomplete  Blood Culture ID Panel (Reflexed)     Status: Abnormal   Collection Time: 04/12/21  4:24 AM  Result Value Ref Range Status   Enterococcus faecalis NOT DETECTED NOT DETECTED Final   Enterococcus Faecium NOT DETECTED NOT DETECTED Final   Listeria monocytogenes NOT DETECTED NOT DETECTED Final   Staphylococcus species DETECTED (A) NOT DETECTED Final    Comment: CRITICAL RESULT CALLED TO, READ BACK BY AND VERIFIED WITH: L POINDEXTER,PHARMD'@2250'$  04/12/21 [place]    Staphylococcus aureus (BCID) DETECTED (A) NOT DETECTED Final    Comment: CRITICAL RESULT CALLED TO, READ BACK BY AND VERIFIED WITH: L POINDEXTER,PHARMD'@2250'$  04/12/21 [place]    Staphylococcus epidermidis NOT DETECTED NOT DETECTED Final   Staphylococcus lugdunensis NOT DETECTED NOT DETECTED Final   Streptococcus species NOT DETECTED NOT DETECTED Final   Streptococcus agalactiae NOT DETECTED NOT DETECTED Final   Streptococcus pneumoniae NOT DETECTED NOT DETECTED Final   Streptococcus pyogenes NOT DETECTED NOT DETECTED Final   A.calcoaceticus-baumannii NOT DETECTED NOT DETECTED Final   Bacteroides fragilis NOT DETECTED NOT DETECTED Final   Enterobacterales NOT DETECTED NOT DETECTED Final   Enterobacter cloacae complex NOT DETECTED NOT DETECTED Final   Escherichia coli NOT DETECTED NOT DETECTED Final   Klebsiella aerogenes NOT DETECTED NOT DETECTED Final   Klebsiella oxytoca NOT DETECTED NOT DETECTED Final   Klebsiella pneumoniae NOT DETECTED NOT DETECTED Final   Proteus species NOT DETECTED NOT  DETECTED Final   Salmonella species NOT DETECTED NOT DETECTED Final   Serratia marcescens NOT  DETECTED NOT DETECTED Final   Haemophilus influenzae NOT DETECTED NOT DETECTED Final   Neisseria meningitidis NOT DETECTED NOT DETECTED Final   Pseudomonas aeruginosa NOT DETECTED NOT DETECTED Final   Stenotrophomonas maltophilia NOT DETECTED NOT DETECTED Final   Candida albicans NOT DETECTED NOT DETECTED Final   Candida auris NOT DETECTED NOT DETECTED Final   Candida glabrata NOT DETECTED NOT DETECTED Final   Candida krusei NOT DETECTED NOT DETECTED Final   Candida parapsilosis NOT DETECTED NOT DETECTED Final   Candida tropicalis NOT DETECTED NOT DETECTED Final   Cryptococcus neoformans/gattii NOT DETECTED NOT DETECTED Final   Meth resistant mecA/C and MREJ NOT DETECTED NOT DETECTED Final    Comment: Performed at [facility], [place] [address] [place], [place] [address]  Resp Panel by RT-PCR (Flu A&B, Covid) Nasopharyngeal Swab     Status: None   Collection Time: 04/12/21  7:05 AM   Specimen: Nasopharyngeal Swab; Nasopharyngeal(NP) swabs in vial transport medium  Result Value Ref Range Status   SARS Coronavirus 2 by RT PCR NEGATIVE NEGATIVE Final    Comment: (NOTE) SARS-CoV-2 target nucleic acids are NOT DETECTED.  The SARS-CoV-2 RNA is generally detectable in upper respiratory specimens during the acute phase of infection. The lowest concentration of SARS-CoV-2 viral copies this assay can detect is 138 copies/mL. A negative result does not preclude SARS-Cov-2 infection and should not be used as the sole basis for treatment or other patient management decisions. A negative result may occur with  improper specimen collection/handling, submission of specimen other than nasopharyngeal swab, presence of viral mutation(s) within the areas targeted by this assay, and inadequate number of viral copies(<138 copies/mL). A negative result must be combined with clinical observations, patient  history, and epidemiological information. The expected result is Negative.  Fact Sheet for Patients:  EntrepreneurPulse.com.au  Fact Sheet for Healthcare Providers:  IncredibleEmployment.be  This test is no t yet approved or cleared by the [place] FDA and  has been authorized for detection and/or diagnosis of SARS-CoV-2 by FDA under an Emergency Use Authorization (EUA). This EUA will remain  in effect (meaning this test can be used) for the duration of the COVID-19 declaration under Section 564(b)(1) of the Act, 21 U.S.C.section 360bbb-3(b)(1), unless the authorization is terminated  or revoked sooner.       Influenza A by PCR NEGATIVE NEGATIVE Final   Influenza B by PCR NEGATIVE NEGATIVE Final    Comment: (NOTE) The Xpert Xpress SARS-CoV-2/FLU/RSV plus assay is intended as an aid in the diagnosis of influenza from Nasopharyngeal swab specimens and should not be used as a sole basis for treatment. Nasal washings and aspirates are unacceptable for Xpert Xpress SARS-CoV-2/FLU/RSV testing.  Fact Sheet for Patients: EntrepreneurPulse.com.au  Fact Sheet for Healthcare Providers: IncredibleEmployment.be  This test is not yet approved or cleared by the [place] FDA and has been authorized for detection and/or diagnosis of SARS-CoV-2 by FDA under an Emergency Use Authorization (EUA). This EUA will remain in effect (meaning this test can be used) for the duration of the COVID-19 declaration under Section 564(b)(1) of the Act, 21 U.S.C. section 360bbb-3(b)(1), unless the authorization is terminated or revoked.  Performed at [facility], [place] [address] [place], [place] [address]   Respiratory (~20 pathogens) panel by PCR     Status: None   Collection Time: 04/12/21  9:59 AM   Specimen: Nasopharyngeal Swab; Respiratory  Result Value Ref Range Status   Adenovirus NOT DETECTED NOT  DETECTED Final  Coronavirus 229E NOT DETECTED NOT DETECTED Final    Comment: (NOTE) The Coronavirus on the Respiratory Panel, DOES NOT test for the novel  Coronavirus (2019 nCoV)    Coronavirus HKU1 NOT DETECTED NOT DETECTED Final   Coronavirus NL63 NOT DETECTED NOT DETECTED Final   Coronavirus OC43 NOT DETECTED NOT DETECTED Final   Metapneumovirus NOT DETECTED NOT DETECTED Final   Rhinovirus / Enterovirus NOT DETECTED NOT DETECTED Final   Influenza A NOT DETECTED NOT DETECTED Final   Influenza B NOT DETECTED NOT DETECTED Final   Parainfluenza Virus 1 NOT DETECTED NOT DETECTED Final   Parainfluenza Virus 2 NOT DETECTED NOT DETECTED Final   Parainfluenza Virus 3 NOT DETECTED NOT DETECTED Final   Parainfluenza Virus 4 NOT DETECTED NOT DETECTED Final   Respiratory Syncytial Virus NOT DETECTED NOT DETECTED Final   Bordetella pertussis NOT DETECTED NOT DETECTED Final   Bordetella Parapertussis NOT DETECTED NOT DETECTED Final   Chlamydophila pneumoniae NOT DETECTED NOT DETECTED Final   Mycoplasma pneumoniae NOT DETECTED NOT DETECTED Final    Comment: Performed at [facility], [place] [address] [place], [place] [address]    Pertinent Lab seen by me: CBC Latest Ref Rng & Units 04/13/2021 04/12/2021 04/12/2021  WBC 4.0 - 10.5 K/uL 10.7(H) 11.6(H) 11.4(H)  Hemoglobin 13.0 - 17.0 g/dL 13.6 15.8 16.9  Hematocrit 39.0 - 52.0 % 40.3 47.6 52.1(H)  Platelets 150 - 400 K/uL 151 162 187   CMP Latest Ref Rng & Units 04/13/2021 04/12/2021 04/12/2021  Glucose 70 - 99 mg/dL 195(H) - 123(H)  BUN 8 - 23 mg/dL 19 - 15  Creatinine 0.61 - 1.24 mg/dL 0.79 0.96 1.11  Sodium 135 - 145 mmol/L 138 - 137  Potassium 3.5 - 5.1 mmol/L 3.6 - 4.3  Chloride 98 - 111 mmol/L 105 - 100  CO2 22 - 32 mmol/L 26 - 29  Calcium 8.9 - 10.3 mg/dL 8.8(L) - 8.8(L)  Total Protein 6.5 - 8.1 g/dL 5.8(L) - 7.5  Total Bilirubin 0.3 - 1.2 mg/dL 0.6 - 0.7  Alkaline Phos 38 - 126 U/L 48 - 81  AST 15 - 41 U/L 18 - 16  ALT 0 - 44 U/L 22 -  18    Pertinent Imagings/Other Imagings Plain films and CT images have been personally visualized and interpreted; radiology reports have been reviewed. Decision making incorporated into the Impression / Recommendations.  MRI Lumbar spine 04/06/21 IMPRESSION: 1. Multilevel spondylosis of the lumbar spine as described. 2. 5 mm grade 1 anterolisthesis at L5-S1 with advanced bilateral facet hypertrophy and uncovering of a broad-based disc protrusion. 3. Moderate foraminal narrowing bilaterally at L5-S1 is worse on the left. 4. Moderate right and mild left foraminal stenosis at L3-4. 5. Moderate foraminal stenosis bilaterally at L4-5 is worse on the right.  Chest Xray  FINDINGS: Chronic cardiomegaly. Stable aortic and hilar contours when accounting for rotation. Prior aortic valve repair. Interstitial prominence. No focal opacity, effusion, pneumothorax, or Kerley lines   IMPRESSION: 1. Vascular congestion versus hypoinflation. 2. Chronic cardiomegaly. 3. No clear pneumonia.  CT head 04/12/21 IMPRESSION: Senescent changes without acute finding.  CTAPE 04/12/21 IMPRESSION: 1. No acute finding.  No evidence of acute aortic syndrome. 2. Aortic valve replacement. 3. 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 No progression since 2021. 4. Incidental findings are described above.  I spent more than 70  minutes for this patient encounter including review of prior medical records/discussing diagnostics and treatment plan with the patient/family/coordinate care  with primary/other specialits with greater than 50% of time in face to face encounter.   Electronically signed by:   Rosiland Oz, MD Infectious Disease Physician [facility] Infectious Disease Pager: [phone]

## 2021-04-13 NOTE — Evaluation (Signed)
Physical Therapy Evaluation Patient Details Name: Michael Hines MRN: [id number] DOB: 03/01/1946 Today's Date: 04/13/2021   History of Present Illness  Michael Hines is a 75 y.o. male with medical history significant of tobacco abuse, COPD, DM2, anxiety. Presenting with dyspnea and confusion.  He was found to have a blood infection.  Clinical Impression  Pt admitted with above diagnosis. Pt currently with functional limitations due to the deficits listed below (see PT Problem List). Pt will benefit from skilled PT to increase their independence and safety with mobility to allow discharge to the venue listed below.  Pt with numerous falls at home per his wife.  He had some difficulty initiating steps and wife reports he has "Parkinsonian like" tremors. At this time recommend SNF for short term rehab.    Follow Up Recommendations SNF    Equipment Recommendations  None recommended by PT    Recommendations for Other Services OT consult     Precautions / Restrictions Precautions Precautions: Fall Precaution Comments: multiple falls at home Restrictions Weight Bearing Restrictions: No      Mobility  Bed Mobility Overal bed mobility: Needs Assistance Bed Mobility: Rolling;Sidelying to Sit Rolling: Min assist Sidelying to sit: Mod assist       General bed mobility comments: MOD A to get trunk upright. Once upright, he became nauseous and started vomiting    Transfers Overall transfer level: Needs assistance Equipment used: Rolling walker (2 wheeled) Transfers: Sit to/from [facility] Sit to Stand: Mod assist;From elevated surface Stand pivot transfers: Mod assist       General transfer comment: Difficulty initiating step with R foot and needed physical A to move RW  Ambulation/Gait             General Gait Details: deferred due to vomiting  Stairs            Wheelchair Mobility    Modified Rankin (Stroke Patients Only)        Balance Overall balance assessment: Needs assistance;History of Falls Sitting-balance support: Feet supported Sitting balance-Leahy Scale: Fair     Standing balance support: Bilateral upper extremity supported Standing balance-Leahy Scale: Poor                               Pertinent Vitals/Pain Pain Assessment: Faces Faces Pain Scale: Hurts even more Pain Location: "all over" hx of back pain Pain Descriptors / Indicators: Grimacing;Guarding Pain Intervention(s): Repositioned    Home Living Family/patient expects to be discharged to:: Private residence Living Arrangements: Spouse/significant other   Type of Home: House Home Access: Stairs to enter   Technical brewer of Steps: 2   Home Equipment: Cane - single point;Walker - 2 wheels      Prior Function Level of Independence: Independent with assistive device(s)         Comments: uses RW vs cane depending on the day     Hand Dominance        Extremity/Trunk Assessment   Upper Extremity Assessment Upper Extremity Assessment: Defer to OT evaluation    Lower Extremity Assessment Lower Extremity Assessment: Generalized weakness       Communication   Communication: No difficulties  Cognition Arousal/Alertness: Awake/alert Behavior During Therapy: WFL for tasks assessed/performed Overall Cognitive Status: Within Functional Limits for tasks assessed  General Comments: He jokes around a lot      General Comments General comments (skin integrity, edema, etc.): bruising noted on legs which wife reports is from falls    Exercises     Assessment/Plan    PT Assessment Patient needs continued PT services  PT Problem List Decreased strength;Decreased activity tolerance;Decreased balance;Decreased mobility;Decreased safety awareness       PT Treatment Interventions DME instruction;Gait training;Functional mobility training;Therapeutic  activities;Therapeutic exercise;Balance training;Neuromuscular re-education    PT Goals (Current goals can be found in the Care Plan section)  Acute Rehab PT Goals Patient Stated Goal: get stronger PT Goal Formulation: With patient Time For Goal Achievement: 04/27/21 Potential to Achieve Goals: Good    Frequency Min 2X/week   Barriers to discharge Decreased caregiver support wife unable to assist pt at his current level    Co-evaluation               AM-PAC PT "6 Clicks" Mobility  Outcome Measure Help needed turning from your back to your side while in a flat bed without using bedrails?: A Lot Help needed moving from lying on your back to sitting on the side of a flat bed without using bedrails?: A Lot Help needed moving to and from a bed to a chair (including a wheelchair)?: A Lot Help needed standing up from a chair using your arms (e.g., wheelchair or bedside chair)?: A Lot Help needed to walk in hospital room?: A Lot Help needed climbing 3-5 steps with a railing? : A Lot 6 Click Score: 12    End of Session Equipment Utilized During Treatment: Gait belt Activity Tolerance: Treatment limited secondary to medical complications (Comment) (vomiting) Patient left: in chair;with call bell/phone within reach;with family/visitor present;Other (comment) (wife is not leaving til pt back in bed) Nurse Communication: Mobility status PT Visit Diagnosis: Unsteadiness on feet (R26.81);History of falling (Z91.81);Other abnormalities of gait and mobility (R26.89);Muscle weakness (generalized) (M62.81)    Time: [id number] PT Time Calculation (min) (ACUTE ONLY): 26 min   Charges:   PT Evaluation $PT Eval Moderate Complexity: 1 Mod PT Treatments $Therapeutic Activity: 8-22 mins        Liyat Faulkenberry L. Tamala Julian, PT  04/13/2021   Galen Manila 04/13/2021, 2:21 PM

## 2021-04-13 NOTE — Progress Notes (Signed)
Pt. appeared more weak  and confused this afternoon. It take 3 persons to bring him back to bed. Vital signs taken and recorded. O2 Sats is 85%  keep pulling his oxygen, IV, telemetry and condom cath. Pt. is on mittens now. O2 at 2L.saturation of 92%. MD was notified. Will monitor patient closely.

## 2021-04-14 LAB — COMPREHENSIVE METABOLIC PANEL
ALT: 28 U/L (ref 0–44)
AST: 29 U/L (ref 15–41)
Albumin: 3 g/dL — ABNORMAL LOW (ref 3.5–5.0)
Alkaline Phosphatase: 49 U/L (ref 38–126)
Anion gap: 11 (ref 5–15)
BUN: 24 mg/dL — ABNORMAL HIGH (ref 8–23)
CO2: 25 mmol/L (ref 22–32)
Calcium: 8.7 mg/dL — ABNORMAL LOW (ref 8.9–10.3)
Chloride: 100 mmol/L (ref 98–111)
Creatinine, Ser: 1 mg/dL (ref 0.61–1.24)
GFR, Estimated: >60 mL/min (ref >60)
Glucose, Bld: 103 mg/dL — ABNORMAL HIGH (ref 70–99)
Potassium: 3.6 mmol/L (ref 3.5–5.1)
Sodium: 136 mmol/L (ref 135–145)
Total Bilirubin: 0.7 mg/dL (ref 0.3–1.2)
Total Protein: 6.3 g/dL — ABNORMAL LOW (ref 6.5–8.1)

## 2021-04-14 LAB — CBC
HCT: 44.7 % (ref 39.0–52.0)
Hemoglobin: 15.1 g/dL (ref 13.0–17.0)
MCH: 31.3 pg (ref 26.0–34.0)
MCHC: 33.8 g/dL (ref 30.0–36.0)
MCV: 92.5 fL (ref 80.0–100.0)
Platelets: 122 10*3/uL — ABNORMAL LOW (ref 150–400)
RBC: 4.83 MIL/uL (ref 4.22–5.81)
RDW: 15.1 % (ref 11.5–15.5)
WBC: 6 10*3/uL (ref 4.0–10.5)
nRBC: 0 % (ref 0.0–0.2)

## 2021-04-14 LAB — GLUCOSE, CAPILLARY
Glucose-Capillary: 137 mg/dL — ABNORMAL HIGH (ref 70–99)
Glucose-Capillary: 122 mg/dL — ABNORMAL HIGH (ref 70–99)
Glucose-Capillary: 110 mg/dL — ABNORMAL HIGH (ref 70–99)

## 2021-04-14 NOTE — Progress Notes (Signed)
[facility]  PROGRESS NOTE  [name] [id number] DOB: Jun 23, 1946 DOA: 04/12/2021 PCP: Finis Bud, MD   Brief HPI:   75 year old male with medical history of tobacco abuse, COPD, diabetes mellitus type 2 presented with fever and chills with shortness of breath and altered mental status.  Per wife, patient woke up middle of night was breathing heavily, shivering.  She checked his temperature which was 100.8.  EMS was called.  In the ED he was found to be tachycardic, tachypneic.  CT abdomen/pelvis was negative, mild leukocytosis, was found to be toxemic on room air. Patient was started on antibiotics and steroids for possible COPD exacerbation.   Subjective   Patient seen and examined, developed delirium last night.  Mittens in place.   Assessment/Plan:    MSSA bacteremia -Patient started on IV cefazolin. -No clear source -ID recommends TEE, as patient has history of aortic valve replacement in 2013 -MRI lumbar spine only showed multilevel spondylosis, no discitis or abscess. - cardiology consulted for TEE  Metabolic encephalopathy/delirium -In setting of MSSA bacteremia -Haldol 2 mg p.o. x1 as needed ordered -Continue to monitor  Dyspnea -Resolved; likely was tachypneic from bacteremia -We discontinued prednisone  -Continue DuoNeb nebulization every 6 hours as needed  Recent falls -PT evaluation obtained -Recommended to go to rehab  Diabetes mellitus type 2 -Continue sliding scale insulin with NovoLog -CBG well controlled   Scheduled medications:    buPROPion  300 mg Oral Daily   enoxaparin (LOVENOX) injection  40 mg Subcutaneous Q24H   haloperidol  2 mg Oral Once   Or   haloperidol lactate  2 mg Intramuscular Once   insulin aspart  0-15 Units Subcutaneous TID WC   insulin aspart  0-5 Units Subcutaneous QHS         Data Reviewed:   CBG:  Recent Labs  Lab 04/13/21 1223 04/13/21 1700 04/13/21 2139 04/14/21 0755 04/14/21 1211   GLUCAP 190* 142* 120* 137* 122*    SpO2: 94 % O2 Flow Rate (L/min): 2 L/min    Vitals:   04/13/21 2246 04/14/21 0428 04/14/21 0952 04/14/21 1416  BP:  (!) 147/99  123/87  Pulse:  75  85  Resp:  20  18  Temp:  98.4 F (36.9 C)  98.7 F (37.1 C)  TempSrc:  Oral  Oral  SpO2: 93% 93% 94% 94%  Weight:      Height:         Intake/Output Summary (Last 24 hours) at 04/14/2021 1456 Last data filed at 04/14/2021 0444 Gross per 24 hour  Intake 493.61 ml  Output 250 ml  Net 243.61 ml    09/02 1901 - 09/04 0700 In: 493.6 [I.V.:43.6] Out: 850 [Urine:850]  Filed Weights   04/13/21 1527  Weight: 94.2 kg    CBC:  Recent Labs  Lab 04/12/21 0423 04/12/21 0959 04/13/21 0556 04/14/21 0537  WBC 11.4* 11.6* 10.7* 6.0  HGB 16.9 15.8 13.6 15.1  HCT 52.1* 47.6 40.3 44.7  PLT 187 162 151 122*  MCV 95.9 94.8 92.2 92.5  MCH 31.1 31.5 31.1 31.3  MCHC 32.4 33.2 33.7 33.8  RDW 15.0 15.0 14.6 15.1  LYMPHSABS 0.9  --   --   --   MONOABS 0.7  --   --   --   EOSABS 0.0  --   --   --   BASOSABS 0.1  --   --   --     Complete metabolic panel:  Recent Labs  Lab 04/12/21 0423 04/12/21 0616 04/12/21 0959 04/13/21 0556 04/14/21 0537  NA 137  --   --  138 136  K 4.3  --   --  3.6 3.6  CL 100  --   --  105 100  CO2 29  --   --  26 25  GLUCOSE 123*  --   --  195* 103*  BUN 15  --   --  19 24*  CREATININE 1.11  --  0.96 0.79 1.00  CALCIUM 8.8*  --   --  8.8* 8.7*  AST 16  --   --  18 29  ALT 18  --   --  22 28  ALKPHOS 81  --   --  48 49  BILITOT 0.7  --   --  0.6 0.7  ALBUMIN 3.8  --   --  2.9* 3.0*  LATICACIDVEN 1.5 1.0  --   --   --   INR 0.8  --   --   --   --   HGBA1C  --   --  7.9*  --   --     No results for input(s): LIPASE, AMYLASE in the last 168 hours.  Recent Labs  Lab 04/12/21 0705  SARSCOV2NAA NEGATIVE    ------------------------------------------------------------------------------------------------------------------ No results for input(s): CHOL, HDL,  LDLCALC, TRIG, CHOLHDL, LDLDIRECT in the last 72 hours.  Lab Results  Component Value Date   HGBA1C 7.9 (H) 04/12/2021   ------------------------------------------------------------------------------------------------------------------ No results for input(s): TSH, T4TOTAL, T3FREE, THYROIDAB in the last 72 hours.  Invalid input(s): FREET3 ------------------------------------------------------------------------------------------------------------------ No results for input(s): VITAMINB12, FOLATE, FERRITIN, TIBC, IRON, RETICCTPCT in the last 72 hours.  Coagulation profile Recent Labs  Lab 04/12/21 0423  INR 0.8   No results for input(s): DDIMER in the last 72 hours.  Cardiac Enzymes No results for input(s): CKTOTAL, CKMB, CKMBINDEX, TROPONINI in the last 168 hours.  ------------------------------------------------------------------------------------------------------------------    Component Value Date/Time   BNP 74.6 12/04/2019 2057     Antibiotics: Anti-infectives (From admission, onward)    Start     Dose/Rate Route Frequency Ordered Stop   04/13/21 2200  ceFAZolin (ANCEF) IVPB 2g/100 mL premix        2 g 200 mL/hr over 30 Minutes Intravenous Every 8 hours 04/13/21 1452     04/13/21 1500  ceFAZolin (ANCEF) IVPB 2g/100 mL premix        2 g 200 mL/hr over 30 Minutes Intravenous STAT 04/13/21 1452 04/14/21 1146   04/12/21 1000  cefTRIAXone (ROCEPHIN) 1 g in sodium chloride 0.9 % 100 mL IVPB  Status:  Discontinued        1 g 200 mL/hr over 30 Minutes Intravenous Every 24 hours 04/12/21 0958 04/13/21 1452   04/12/21 0445  ceFEPIme (MAXIPIME) 2 g in sodium chloride 0.9 % 100 mL IVPB        2 g 200 mL/hr over 30 Minutes Intravenous  Once 04/12/21 0436 04/12/21 0520        Radiology Reports  ECHOCARDIOGRAM COMPLETE  Result Date: 04/13/2021    ECHOCARDIOGRAM REPORT   Patient Name:   Michael Hines Dunivan Date of Exam: 04/13/2021 Medical Rec #:  [id number]         Height:        69.0 in Accession #:    [id number]        Weight:       215.0 lb Date of Birth:  1946/01/26  BSA:          2.130 m Patient Age:    75 years          BP:           133/73 mmHg Patient Gender: M                 HR:           74 bpm. Exam Location:  Inpatient Procedure: 2D Echo, Cardiac Doppler and Color Doppler                              MODIFIED REPORT: This report was modified by Cherlynn Kaiser MD on 04/13/2021 due to revision.  Indications:     Bacteremia  History:         Patient has prior history of Echocardiogram examinations, most                  recent 09/01/2018. Risk Factors:Diabetes and Dyslipidemia.                  Aortic Valve: 21 mm pericardial valve is present in the aortic                  position. Procedure Date: Dec 2013.  Sonographer:     Jansen Referring Phys:  [id number] Rosiland Oz Diagnosing Phys: Cherlynn Kaiser MD  Sonographer Comments: Patient is morbidly obese, Technically challenging study due to limited acoustic windows, suboptimal parasternal window, suboptimal apical window and suboptimal subcostal window. Image acquisition challenging due to patient body habitus. IMPRESSIONS  1. Image quality is insufficient to exclude valvular vegetations. Consider TEE if clinically indicated.  2. Left ventricular ejection fraction, by estimation, is 60 to 65%. The left ventricle has normal function. Left ventricular endocardial border not optimally defined to evaluate regional wall motion. There is mild left ventricular hypertrophy. Left ventricular diastolic parameters are consistent with Grade I diastolic dysfunction (impaired relaxation).  3. Right ventricular systolic function is mildly reduced. The right ventricular size is normal. Tricuspid regurgitation signal is inadequate for assessing PA pressure.  4. The mitral valve is grossly normal. Trivial mitral valve regurgitation. No evidence of mitral stenosis.  5. S/p redo surgical aortic valve replacement. Calculations most  consistent with patient prosthesis mismatch. EOA 1.16 cm2, iEOA 0.54, DI 0.37, AT 71 msec. . The aortic valve has been repaired/replaced. Aortic valve regurgitation is trivial. 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 Procedure Date: Dec 2013. Aortic valve mean gradient measures 19.0 mmHg.  6. Aortic dilatation noted. There is mild dilatation of the ascending aorta, measuring 44 mm.  7. The inferior vena cava is normal in size with greater than 50% respiratory variability, suggesting right atrial pressure of 3 mmHg. Comparison(s): Compared to report of echo from 6/22 at outside hospital, gradient through aortic valve is known and stable. FINDINGS  Left Ventricle: Left ventricular ejection fraction, by estimation, is 60 to 65%. The left ventricle has normal function. Left ventricular endocardial border not optimally defined to evaluate regional wall motion. The left ventricular internal cavity size was normal in size. There is mild left ventricular hypertrophy. Left ventricular diastolic parameters are consistent with Grade I diastolic dysfunction (impaired relaxation). Right Ventricle: The right ventricular size is normal. Right vetricular wall thickness was not well visualized. Right ventricular systolic function is mildly reduced. Tricuspid regurgitation signal is inadequate for assessing PA pressure. Left Atrium:  Left atrial size was normal in size. Right Atrium: Right atrial size was normal in size. Pericardium: There is no evidence of pericardial effusion. Presence of pericardial fat pad. Mitral Valve: The mitral valve is grossly normal. Trivial mitral valve regurgitation. No evidence of mitral valve stenosis. MV peak gradient, 4.2 mmHg. The mean mitral valve gradient is 2.0 mmHg. Tricuspid Valve: The tricuspid valve is grossly normal. Tricuspid valve regurgitation is trivial. Aortic Valve: S/p redo surgical aortic valve replacement. Calculations most consistent with patient prosthesis  mismatch. EOA 1.16 cm2, iEOA 0.54, DI 0.37, AT 71 msec. The aortic valve has been repaired/replaced. Aortic valve regurgitation is trivial. Aortic valve mean gradient measures 19.0 mmHg. Aortic valve peak gradient measures 35.3 mmHg. Aortic valve area, by VTI measures 1.16 cm. 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 Procedure Date: Dec 2013. Pulmonic Valve: The pulmonic valve was not well visualized. Pulmonic valve regurgitation is trivial. Aorta: Aortic dilatation noted. There is mild dilatation of the ascending aorta, measuring 44 mm. Venous: The inferior vena cava is normal in size with greater than 50% respiratory variability, suggesting right atrial pressure of 3 mmHg. IAS/Shunts: The interatrial septum was not well visualized.  LEFT VENTRICLE PLAX 2D LVIDd:         4.50 cm     Diastology LVIDs:         3.80 cm     LV e' medial:    4.90 cm/s LV PW:         1.20 cm     LV E/e' medial:  15.1 LV IVS:        1.10 cm     LV e' lateral:   6.42 cm/s LVOT diam:     2.00 cm     LV E/e' lateral: 11.5 LV SV:         57 LV SV Index:   27 LVOT Area:     3.14 cm  LV Volumes (MOD) LV vol d, MOD A2C: 52.3 ml LV vol d, MOD A4C: 57.9 ml LV vol s, MOD A2C: 18.1 ml LV vol s, MOD A4C: 25.4 ml LV SV MOD A2C:     34.2 ml LV SV MOD A4C:     57.9 ml LV SV MOD BP:      33.7 ml RIGHT VENTRICLE RV Basal diam:  3.30 cm RV Mid diam:    2.10 cm TAPSE (M-mode): 1.3 cm LEFT ATRIUM             Index       RIGHT ATRIUM           Index LA Vol (A2C):   28.3 ml 13.28 ml/m RA Area:     20.30 cm LA Vol (A4C):   52.2 ml 24.50 ml/m RA Volume:   57.00 ml  26.75 ml/m LA Biplane Vol: 39.7 ml 18.63 ml/m  AORTIC VALVE                    PULMONIC VALVE AV Area (Vmax):    1.05 cm     PV Vmax:       0.92 m/s AV Area (Vmean):   1.08 cm     PV Vmean:      64.000 cm/s AV Area (VTI):     1.16 cm     PV VTI:        0.157 m AV Vmax:           297.00 cm/s  PV  Peak grad:  3.4 mmHg AV Vmean:          197.000 cm/s PV Mean grad:  2.0  mmHg AV VTI:            0.489 m AV Peak Grad:      35.3 mmHg AV Mean Grad:      19.0 mmHg LVOT Vmax:         99.50 cm/s LVOT Vmean:        67.800 cm/s LVOT VTI:          0.180 m LVOT/AV VTI ratio: 0.37  AORTA Ao Root diam: 3.80 cm Ao Asc diam:  4.40 cm MITRAL VALVE MV Area (PHT): 3.16 cm    SHUNTS MV Peak grad:  4.2 mmHg    Systemic VTI:  0.18 m MV Mean grad:  2.0 mmHg    Systemic Diam: 2.00 cm MV Vmax:       1.02 m/s MV Vmean:      56.4 cm/s MV Decel Time: 240 msec MV E velocity: 73.80 cm/s MV A velocity: 98.50 cm/s MV E/A ratio:  0.75 Cherlynn Kaiser MD Electronically signed by Cherlynn Kaiser MD Signature Date/Time: 04/13/2021/1:24:22 PM    Final (Updated)       DVT prophylaxis: Lovenox  Code Status: Full code  Family Communication: Wife at bedside   Consultants: Infectious disease  Procedures:     Objective    Physical Examination:  General-appears in no acute distress Heart-S1-S2, regular, no murmur auscultated Lungs-clear to auscultation bilaterally, no wheezing or crackles auscultated Abdomen-soft, nontender, no organomegaly Extremities-no edema in the lower extremities Neuro-alert, oriented to self and place only   Status is: Inpatient  Dispo: The patient is from: Home              Anticipated d/c is to: Skilled nursing facility              Anticipated d/c date is: 04/17/2021              Patient currently not stable for discharge  Barrier to discharge-work-up for MSSA bacteremia  COVID-19 Labs  No results for input(s): DDIMER, FERRITIN, LDH, CRP in the last 72 hours.  Lab Results  Component Value Date   [place] NEGATIVE 04/12/2021    Microbiology  Recent Results (from the past 240 hour(s))  Blood culture (routine x 2)     Status: Abnormal (Preliminary result)   Collection Time: 04/12/21  4:23 AM   Specimen: BLOOD  Result Value Ref Range Status   Specimen Description   Final    BLOOD BLOOD LEFT HAND Performed at [facility], [place] [address], [place], [place] [address]    Special Requests   Final    BOTTLES DRAWN AEROBIC AND ANAEROBIC Blood Culture adequate volume Performed at [facility] [address] [place], [place] [address]    Culture  Setup Time   Final    GRAM POSITIVE COCCI IN CLUSTERS IN BOTH AEROBIC AND ANAEROBIC BOTTLES CRITICAL VALUE NOTED.  VALUE IS CONSISTENT WITH PREVIOUSLY REPORTED AND CALLED VALUE.    Culture (A)  Final    STAPHYLOCOCCUS AUREUS CULTURE REINCUBATED FOR BETTER GROWTH Performed at [facility], [place] [address], [place], [place] [address]    Report Status PENDING  Incomplete  Blood culture (routine x 2)     Status: Abnormal (Preliminary result)   Collection Time: 04/12/21  4:24 AM   Specimen: Right Antecubital; Blood  Result Value Ref Range  Status   Specimen Description   Final    RIGHT ANTECUBITAL Performed at [facility] [address] [place], [place] [address]    Special Requests   Final    BOTTLES DRAWN AEROBIC AND ANAEROBIC Blood Culture results may not be optimal due to an inadequate volume of blood received in culture bottles Performed at [facility] [address] [place], [place] [address]    Culture  Setup Time   Final    GRAM POSITIVE COCCI IN CLUSTERS IN BOTH AEROBIC AND ANAEROBIC BOTTLES CRITICAL RESULT CALLED TO, READ BACK BY AND VERIFIED WITH: L POINDEXTER,PHARMD'@2250'$  04/12/21 [place]    Culture (A)  Final    STAPHYLOCOCCUS AUREUS SUSCEPTIBILITIES TO FOLLOW Performed at [facility], [place] [address], [place], [place] [address]    Report Status PENDING  Incomplete  Blood Culture ID Panel (Reflexed)     Status: Abnormal   Collection Time: 04/12/21  4:24 AM  Result Value Ref Range Status   Enterococcus faecalis NOT DETECTED NOT DETECTED Final   Enterococcus Faecium NOT DETECTED NOT DETECTED Final   Listeria monocytogenes NOT DETECTED NOT DETECTED Final   Staphylococcus species  DETECTED (A) NOT DETECTED Final    Comment: CRITICAL RESULT CALLED TO, READ BACK BY AND VERIFIED WITH: L POINDEXTER,PHARMD'@2250'$  04/12/21 [place]    Staphylococcus aureus (BCID) DETECTED (A) NOT DETECTED Final    Comment: CRITICAL RESULT CALLED TO, READ BACK BY AND VERIFIED WITH: L POINDEXTER,PHARMD'@2250'$  04/12/21 [place]    Staphylococcus epidermidis NOT DETECTED NOT DETECTED Final   Staphylococcus lugdunensis NOT DETECTED NOT DETECTED Final   Streptococcus species NOT DETECTED NOT DETECTED Final   Streptococcus agalactiae NOT DETECTED NOT DETECTED Final   Streptococcus pneumoniae NOT DETECTED NOT DETECTED Final   Streptococcus pyogenes NOT DETECTED NOT DETECTED Final   A.calcoaceticus-baumannii NOT DETECTED NOT DETECTED Final   Bacteroides fragilis NOT DETECTED NOT DETECTED Final   Enterobacterales NOT DETECTED NOT DETECTED Final   Enterobacter cloacae complex NOT DETECTED NOT DETECTED Final   Escherichia coli NOT DETECTED NOT DETECTED Final   Klebsiella aerogenes NOT DETECTED NOT DETECTED Final   Klebsiella oxytoca NOT DETECTED NOT DETECTED Final   Klebsiella pneumoniae NOT DETECTED NOT DETECTED Final   Proteus species NOT DETECTED NOT DETECTED Final   Salmonella species NOT DETECTED NOT DETECTED Final   Serratia marcescens NOT DETECTED NOT DETECTED Final   Haemophilus influenzae NOT DETECTED NOT DETECTED Final   Neisseria meningitidis NOT DETECTED NOT DETECTED Final   Pseudomonas aeruginosa NOT DETECTED NOT DETECTED Final   Stenotrophomonas maltophilia NOT DETECTED NOT DETECTED Final   Candida albicans NOT DETECTED NOT DETECTED Final   Candida auris NOT DETECTED NOT DETECTED Final   Candida glabrata NOT DETECTED NOT DETECTED Final   Candida krusei NOT DETECTED NOT DETECTED Final   Candida parapsilosis NOT DETECTED NOT DETECTED Final   Candida tropicalis NOT DETECTED NOT DETECTED Final   Cryptococcus neoformans/gattii NOT DETECTED NOT DETECTED Final   Meth resistant mecA/C and MREJ NOT  DETECTED NOT DETECTED Final    Comment: Performed at [facility], [address], [place],  [address]  Resp Panel by RT-PCR (Flu A&B, Covid) Nasopharyngeal Swab     Status: None   Collection Time: 04/12/21  7:05 AM   Specimen: Nasopharyngeal Swab; Nasopharyngeal(NP) swabs in vial transport medium  Result Value Ref Range Status   SARS Coronavirus 2 by RT PCR NEGATIVE NEGATIVE Final    Comment: (NOTE) SARS-CoV-2 target nucleic acids  are NOT DETECTED.  The SARS-CoV-2 RNA is generally detectable in upper respiratory specimens during the acute phase of infection. The lowest concentration of SARS-CoV-2 viral copies this assay can detect is 138 copies/mL. A negative result does not preclude SARS-Cov-2 infection and should not be used as the sole basis for treatment or other patient management decisions. A negative result may occur with  improper specimen collection/handling, submission of specimen other than nasopharyngeal swab, presence of viral mutation(s) within the areas targeted by this assay, and inadequate number of viral copies(<138 copies/mL). A negative result must be combined with clinical observations, patient history, and epidemiological information. The expected result is Negative.  Fact Sheet for Patients:  EntrepreneurPulse.com.au  Fact Sheet for Healthcare Providers:  IncredibleEmployment.be  This test is no t yet approved or cleared by the [place] FDA and  has been authorized for detection and/or diagnosis of SARS-CoV-2 by FDA under an Emergency Use Authorization (EUA). This EUA will remain  in effect (meaning this test can be used) for the duration of the COVID-19 declaration under Section 564(b)(1) of the Act, 21 U.S.C.section 360bbb-3(b)(1), unless the authorization is terminated  or revoked sooner.       Influenza A by PCR NEGATIVE NEGATIVE Final   Influenza B by PCR NEGATIVE NEGATIVE Final    Comment:  (NOTE) The Xpert Xpress SARS-CoV-2/FLU/RSV plus assay is intended as an aid in the diagnosis of influenza from Nasopharyngeal swab specimens and should not be used as a sole basis for treatment. Nasal washings and aspirates are unacceptable for Xpert Xpress SARS-CoV-2/FLU/RSV testing.  Fact Sheet for Patients: EntrepreneurPulse.com.au  Fact Sheet for Healthcare Providers: IncredibleEmployment.be  This test is not yet approved or cleared by the [place] FDA and has been authorized for detection and/or diagnosis of SARS-CoV-2 by FDA under an Emergency Use Authorization (EUA). This EUA will remain in effect (meaning this test can be used) for the duration of the COVID-19 declaration under Section 564(b)(1) of the Act, 21 U.S.C. section 360bbb-3(b)(1), unless the authorization is terminated or revoked.  Performed at [facility], [place] [address] [place], [place] [address]   Respiratory (~20 pathogens) panel by PCR     Status: None   Collection Time: 04/12/21  9:59 AM   Specimen: Nasopharyngeal Swab; Respiratory  Result Value Ref Range Status   Adenovirus NOT DETECTED NOT DETECTED Final   Coronavirus 229E NOT DETECTED NOT DETECTED Final    Comment: (NOTE) The Coronavirus on the Respiratory Panel, DOES NOT test for the novel  Coronavirus (2019 nCoV)    Coronavirus HKU1 NOT DETECTED NOT DETECTED Final   Coronavirus NL63 NOT DETECTED NOT DETECTED Final   Coronavirus OC43 NOT DETECTED NOT DETECTED Final   Metapneumovirus NOT DETECTED NOT DETECTED Final   Rhinovirus / Enterovirus NOT DETECTED NOT DETECTED Final   Influenza A NOT DETECTED NOT DETECTED Final   Influenza B NOT DETECTED NOT DETECTED Final   Parainfluenza Virus 1 NOT DETECTED NOT DETECTED Final   Parainfluenza Virus 2 NOT DETECTED NOT DETECTED Final   Parainfluenza Virus 3 NOT DETECTED NOT DETECTED Final   Parainfluenza Virus 4 NOT DETECTED NOT DETECTED Final    Respiratory Syncytial Virus NOT DETECTED NOT DETECTED Final   Bordetella pertussis NOT DETECTED NOT DETECTED Final   Bordetella Parapertussis NOT DETECTED NOT DETECTED Final   Chlamydophila pneumoniae NOT DETECTED NOT DETECTED Final   Mycoplasma pneumoniae NOT DETECTED NOT DETECTED Final    Comment: Performed at [facility], [place]  [address], [place], [place] [address]       Oswald Hillock   [facility] If 7PM-7AM, please contact night-coverage at [email or web address], Office  [phone]   04/14/2021, 2:56 PM  LOS: 1 day

## 2021-04-14 NOTE — Evaluation (Signed)
Occupational Therapy Evaluation Patient Details Name: Michael Hines MRN: [id number] DOB: 07/21/1946 Today's Date: 04/14/2021    History of Present Illness Michael Hines is a 75 y.o. male with medical history significant of tobacco abuse, COPD, DM2, anxiety. Presenting with dyspnea and confusion.  He was found to have a blood infection.   Clinical Impression   Patient is a 75 year old male admitted for above. Patient was living at home with wife with independence in ADLs. Currently patietn is max A for bed mobility and ADL tasks at bed level. Patient was noted to have decreased activity tolerance, decreased endurance, decreased sitting balance,and increased pain with movement impacting ability to engage in ADLs. Patient would continue to benefit from skilled OT services at this time while admitted and after d/c to address noted deficits in order to improve overall safety and independence in ADLs.      Follow Up Recommendations  SNF    Equipment Recommendations  Other (comment) (defer to next venue)    Recommendations for Other Services       Precautions / Restrictions Precautions Precautions: Fall Precaution Comments: multiple falls at home Restrictions Weight Bearing Restrictions: No      Mobility Bed Mobility Overal bed mobility: Needs Assistance Bed Mobility: Rolling Rolling: Max assist              Transfers                      Balance                                           ADL either performed or assessed with clinical judgement   ADL Overall ADL's : Needs assistance/impaired Eating/Feeding: Set up;Bed level   Grooming: Wash/dry face;Wash/dry hands;Set up;Bed level   Upper Body Bathing: Bed level;Moderate assistance   Lower Body Bathing: Maximal assistance;Bed level   Upper Body Dressing : Moderate assistance;Bed level   Lower Body Dressing: Maximal assistance;Bed level   Toilet Transfer: Total assistance;+2 for  physical assistance;+2 for safety/equipment;BSC Toilet Transfer Details (indicate cue type and reason): deferred on this date. Toileting- Clothing Manipulation and Hygiene: Total assistance;Bed level         General ADL Comments: patient was max A for rolling to each side in bed with increased time to get patient to participate. patient required max A to attempt to get BLE over to edge of bed with increased time and patient making jokes attempting to distrate from task. patients wife was present in room. patient requested to use bed pan at this time declining to use 3 in 1 commode with nurse and therapist in room. patient was max a with education on log rolling to reduce pressure on back after patient reported back hurt with movement. patietn was educated on proper hand and foot placement to participate in task.     Vision Patient Visual Report: No change from baseline       Perception     Praxis      Pertinent Vitals/Pain Pain Assessment: No/denies pain     Hand Dominance Right   Extremity/Trunk Assessment Upper Extremity Assessment Upper Extremity Assessment: Generalized weakness   Lower Extremity Assessment Lower Extremity Assessment: Defer to PT evaluation   Cervical / Trunk Assessment Cervical / Trunk Assessment: Normal   Communication Communication Communication: No difficulties   Cognition Arousal/Alertness: Awake/alert Behavior  During Therapy: WFL for tasks assessed/performed Overall Cognitive Status: Within Functional Limits for tasks assessed                                     General Comments       Exercises     Shoulder Instructions      Home Living Family/patient expects to be discharged to:: Unsure Living Arrangements: Spouse/significant other   Type of Home: House Home Access: Stairs to enter [employer] of Steps: 2                   Home Equipment: Cane - single point;Walker - 2 wheels          Prior  Functioning/Environment Level of Independence: Independent with assistive device(s)        Comments: uses RW vs cane depending on the day        OT Problem List: Decreased strength;Decreased activity tolerance;Impaired balance (sitting and/or standing);Decreased safety awareness;Decreased cognition      OT Treatment/Interventions: Self-care/ADL training;Therapeutic exercise;Neuromuscular education;DME and/or AE instruction;Therapeutic activities;Balance training;Patient/family education    OT Goals(Current goals can be found in the care plan section)    OT Frequency: Min 2X/week   Barriers to D/C:    patients wife is unable to offer physical assist to patient       Co-evaluation              AM-PAC OT "6 Clicks" Daily Activity     Outcome Measure Help from another person eating meals?: A Little Help from another person taking care of personal grooming?: A Little Help from another person toileting, which includes using toliet, bedpan, or urinal?: Total Help from another person bathing (including washing, rinsing, drying)?: Total Help from another person to put on and taking off regular upper body clothing?: A Lot Help from another person to put on and taking off regular lower body clothing?: Total 6 Click Score: 11   End of Session Nurse Communication: Need for lift equipment;Other (comment) (patient requesting to use bed pan)  Activity Tolerance: Patient limited by fatigue Patient left: in bed;with call bell/phone within reach;with family/visitor present  OT Visit Diagnosis: Unsteadiness on feet (R26.81);Muscle weakness (generalized) (M62.81);History of falling (Z91.81)                Time: [id number] OT Time Calculation (min): 23 min Charges:  OT General Charges $OT Visit: 1 Visit OT Evaluation $OT Eval Moderate Complexity: 1 Mod OT Treatments $Self Care/Home Management : 8-22 mins  Jackelyn Poling OTR/L, MS Acute Rehabilitation Department Office#  [phone] Pager# [phone]   [place] 04/14/2021, 4:17 PM

## 2021-04-15 LAB — CULTURE, BLOOD (ROUTINE X 2): Special Requests: ADEQUATE

## 2021-04-15 LAB — GLUCOSE, CAPILLARY
Glucose-Capillary: 126 mg/dL — ABNORMAL HIGH (ref 70–99)
Glucose-Capillary: 95 mg/dL (ref 70–99)
Glucose-Capillary: 117 mg/dL — ABNORMAL HIGH (ref 70–99)
Glucose-Capillary: 104 mg/dL — ABNORMAL HIGH (ref 70–99)

## 2021-04-15 MED ORDER — LACTATED RINGERS IV SOLN: INTRAVENOUS | Status: AC

## 2021-04-15 NOTE — Progress Notes (Signed)
Physical Therapy Treatment Patient Details Name: Michael Hines MRN: [id number] DOB: 12/13/1945 Today's Date: 04/15/2021    History of Present Illness 75 y.o. male with medical history significant of tobacco abuse, COPD, DM2, anxiety. Presenting with dyspnea and confusion.  He was found to have a blood infection.    PT Comments    Progressing slowly with mobility. Continues to require +2 for safe mobility. Continue to recommend ST SNF.    Follow Up Recommendations  SNF     Equipment Recommendations  None recommended by PT    Recommendations for Other Services       Precautions / Restrictions Precautions Precautions: Fall Precaution Comments: multiple falls at home Restrictions Weight Bearing Restrictions: No    Mobility  Bed Mobility Overal bed mobility: Needs Assistance Bed Mobility: Rolling;Sidelying to Sit;Sit to Sidelying Rolling: Min guard Sidelying to sit: Max assist     Sit to sidelying: Max assist General bed mobility comments: Assist for trunk and bil LEs. Increased time.Cues provided    Transfers Overall transfer level: Needs assistance Equipment used: Rolling walker (2 wheeled) Transfers: Sit to/from Stand Sit to Stand: Mod assist         General transfer comment: x 2. Cues for safety, technique, hand placement. Increased time. Assist to power up, steady, control descent.  Ambulation/Gait Ambulation/Gait assistance: Mod assist;+2 safety/equipment   Assistive device: Rolling walker (2 wheeled)       General Gait Details: Side steps along the side of the bed with a RW. Cues for safety, technique. Assist to stabilize and manage RW   Stairs             Wheelchair Mobility    Modified Rankin (Stroke Patients Only)       Balance Overall balance assessment: Needs assistance;History of Falls         Standing balance support: Bilateral upper extremity supported Standing balance-Leahy Scale: Poor                               Cognition Arousal/Alertness: Awake/alert Behavior During Therapy: WFL for tasks assessed/performed Overall Cognitive Status: Within Functional Limits for tasks assessed                                        Exercises      General Comments        Pertinent Vitals/Pain Pain Assessment: Faces Faces Pain Scale: Hurts even more Pain Location: back Pain Descriptors / Indicators: Grimacing;Guarding Pain Intervention(s): Limited activity within patient's tolerance;Monitored during session;Repositioned    Home Living                      Prior Function            PT Goals (current goals can now be found in the care plan section) Progress towards PT goals: Progressing toward goals    Frequency    Min 2X/week      PT Plan Current plan remains appropriate    Co-evaluation              AM-PAC PT "6 Clicks" Mobility   Outcome Measure  Help needed turning from your back to your side while in a flat bed without using bedrails?: A Lot Help needed moving from lying on your back to sitting on the side of a flat bed  without using bedrails?: A Lot Help needed moving to and from a bed to a chair (including a wheelchair)?: A Lot Help needed standing up from a chair using your arms (e.g., wheelchair or bedside chair)?: A Lot Help needed to walk in hospital room?: A Lot Help needed climbing 3-5 steps with a railing? : A Lot 6 Click Score: 12    End of Session   Activity Tolerance: Patient limited by fatigue;Patient limited by pain Patient left: in bed;with call bell/phone within reach;with bed alarm set;with family/visitor present   PT Visit Diagnosis: Unsteadiness on feet (R26.81);History of falling (Z91.81);Other abnormalities of gait and mobility (R26.89);Muscle weakness (generalized) (M62.81)     Time: [id number] PT Time Calculation (min) (ACUTE ONLY): 18 min  Charges:  $Gait Training: 8-22 mins                         Doreatha Massed, PT Acute Rehabilitation  Office: [phone] Pager: [phone]

## 2021-04-15 NOTE — TOC Initial Note (Signed)
Transition of Care [facility]) - Initial/Assessment Note    Patient Details  Name: Michael Hines MRN: [id number] Date of Birth: [birth year]  [facility] of [facility]) CM/SW Contact:    [name] Mage, LCSW Phone Number: 04/15/2021, 3:06 PM  Clinical Narrative: Met with patient re: PT recommendation of SNF after multiple falls at home, wife at bedside.  Explained to both the process of bed search, insurance authorization.  Patient is hoping to go home, wife is more realistic and wants to pursue SNF as "Plan B," especially since there is a possibility of IV abx.  Would prefer [place]place on the [place] side of town, like [place] or [place].  Bed search initiated. TOC will continue to follow during the course of hospitalization.          Expected Discharge Plan: Skilled Nursing Facility Barriers to Discharge: SNF Pending bed offer   Patient Goals and CMS Choice     Choice offered to / list presented to : Patient, Spouse  Expected Discharge Plan and Services Expected Discharge Plan: [place]   Discharge Planning Services: CM Consult Post Acute Care Choice: [place] Living arrangements for the past 2 months: Single Family Home                                      Prior Living Arrangements/Services Living arrangements for the past 2 months: Single Family Home Lives with:: Spouse Patient language and need for interpreter reviewed:: Yes        Need for Family Participation in Patient Care: Yes (Comment) Care giver support system in place?: Yes (comment)   Criminal Activity/Legal Involvement Pertinent to Current Situation/Hospitalization: No - Comment as needed  Activities of Daily Living Home Assistive Devices/Equipment: Eyeglasses ADL Screening (condition at time of admission) Patient's cognitive ability adequate to safely complete daily activities?: No Is the patient deaf or have difficulty hearing?: No Does the patient have difficulty seeing,  even when wearing glasses/contacts?: No Does the patient have difficulty concentrating, remembering, or making decisions?: Yes Patient able to express need for assistance with ADLs?: Yes Does the patient have difficulty dressing or bathing?: Yes Independently performs ADLs?: No (secondary to shortness of breath and weakness) Communication: Independent Dressing (OT): Needs assistance Is this a change from baseline?: Change from baseline, expected to last >3 days Grooming: Needs assistance Is this a change from baseline?: Change from baseline, expected to last >3 days Feeding: Needs assistance Is this a change from baseline?: Change from baseline, expected to last >3 days Bathing: Needs assistance Is this a change from baseline?: Change from baseline, expected to last >3 days Toileting: Needs assistance Is this a change from baseline?: Change from baseline, expected to last >3days In/Out Bed: Needs assistance Is this a change from baseline?: Change from baseline, expected to last >3 days Walks in Home: Needs assistance Is this a change from baseline?: Change from baseline, expected to last >3 days Does the patient have difficulty walking or climbing stairs?: Yes (secondary to shortness of breath and weakness) Weakness of Legs: Both Weakness of Arms/Hands: None  Permission Sought/Granted Permission sought to share information with : Family Supports Permission granted to share information with : Yes, Verbal Permission Granted  Share Information with NAME: Adsit,Dianne (Spouse)   [phone]           Emotional Assessment Appearance:: Appears stated age Attitude/Demeanor/Rapport: Engaged Affect (typically observed): Appropriate Orientation: :  Oriented to Self, Oriented to Place, Oriented to Situation Alcohol / Substance Use: Not Applicable Psych Involvement: No (comment)  Admission diagnosis:  COPD exacerbation (HCC) [J44.1] Acute respiratory failure with hypoxia (HCC)  [J96.01] Altered mental status, unspecified altered mental status type [R41.82] Sepsis, due to unspecified organism, unspecified whether acute organ dysfunction present [facility]) [A41.9] Bacteremia [R78.81] Patient Active Problem List   Diagnosis Date Noted   COPD exacerbation ([place]) 04/12/2021   Thoracic aortic aneurysm without rupture ([place]) 09/14/2019   Delirium 09/09/2018   Hypokalemia 09/09/2018   Bacteremia    Sepsis due to undetermined organism ([place]) 09/01/2018   Diabetes mellitus without complication ([place]) 94/04/8285   Hyponatremia 09/01/2018   Polycythemia 09/01/2018   Abnormal LFTs 09/01/2018   Hypophosphatemia 09/01/2018   Altered mental status 09/01/2018   PCP:  Finis Bud, MD Pharmacy:   [id number]: [phone]: [phone]    Social Determinants of Health (SDOH) Interventions    Readmission Risk Interventions No flowsheet data found.

## 2021-04-15 NOTE — Plan of Care (Signed)
Pt is injury free, afebrile, alert, and oriented x 3-4. VS within baseline. He complained of back pain one time which was relieved with current pain regiment. He denies chest pain, SOB, or acute changes. Will continue to monitor   Problem: Education: Goal: Knowledge of General Education information will improve Description: Including pain rating scale, medication(s)/side effects and non-pharmacologic comfort measures Outcome: Progressing   Problem: Health Behavior/Discharge Planning: Goal: Ability to manage health-related needs will improve Outcome: Progressing   Problem: Clinical Measurements: Goal: Ability to maintain clinical measurements within normal limits will improve Outcome: Progressing Goal: Will remain free from infection Outcome: Progressing Goal: Diagnostic test results will improve Outcome: Progressing Goal: Respiratory complications will improve Outcome: Progressing Goal: Cardiovascular complication will be avoided Outcome: Progressing

## 2021-04-15 NOTE — Progress Notes (Signed)
    [facility] has been requested to perform a transesophageal echocardiogram for bacteremia.  Patient continues to have encephalopathy limiting ability to participate in medical decision making. I spoke with Michael Hines, patients wife who is listed on DPR and is the medical decision maker at this time. After careful review of history and examination, the risks and benefits of transesophageal echocardiogram have been explained including risks of esophageal damage, perforation (1:10,000 risk), bleeding, pharyngeal hematoma as well as other potential complications associated with conscious sedation including aspiration, arrhythmia, respiratory failure and death. Alternatives to treatment were discussed, questions were answered. Michael Hines is willing to proceed.   Will finalize TEE date/time tomorrow. Tentatively plan for TEE 04/16/21 therefore will make NPO after MN.   Roby Lofts, PA-C 04/15/2021 3:49 PM

## 2021-04-15 NOTE — Progress Notes (Signed)
ID Brief Note   Chart reviewed  Continues to be afebrile, no leukocytosis , plts downtrending  Repeat blood cultures 9/4 no growth in less than 24 hrs  Cardiology consulted by Dr Darrick Meigs for TEE   Plan Continue cefazolin, dosing per pharmacy Follow up repeat blood cultures for clearance F/u TEE Determination of duration pending TEE and blood cultures sterilisation Monitor CBC and BMP Monitor for metastatic sites of infection  Dr Tommy Medal will assume care of patient starting tomorrow   Rosiland Oz, MD Infectious Disease Physician [facility] [address] [place], [place] [address] Phone: [phone]  Fax: [phone]

## 2021-04-15 NOTE — NC FL2 (Signed)
[place] LEVEL OF CARE SCREENING TOOL     IDENTIFICATION  Patient Name: Michael Hines Birthdate: 01-08-1946 Sex: male Admission Date (Current Location): 04/12/2021  [facility] and [place] Number:  Herbalist and Address:  [facility],  [place], [place]      Provider Number: [id number]  Attending Physician Name and Address:  Oswald Hillock, MD  Relative Name and Phone Number:  [place], [place] (Spouse)   [phone]    Current Level of Care: Hospital Recommended Level of Care: [place] Prior Approval Number:    Date Approved/Denied:   PASRR Number: [id number]  Discharge Plan: SNF    Current Diagnoses: Patient Active Problem List   Diagnosis Date Noted   COPD exacerbation ([place]) 04/12/2021   Thoracic aortic aneurysm without rupture ([place]) 09/14/2019   Delirium 09/09/2018   Hypokalemia 09/09/2018   Bacteremia    Sepsis due to undetermined organism ([place]) 09/01/2018   Diabetes mellitus without complication ([place]) [id number]   Hyponatremia 09/01/2018   Polycythemia 09/01/2018   Abnormal LFTs 09/01/2018   Hypophosphatemia 09/01/2018   Altered mental status 09/01/2018    Orientation RESPIRATION BLADDER Height & Weight     Self, Situation, Place  O2 (2L [place]) External catheter Weight: 94.2 kg Height:  '5\' 7"'$  (170.2 cm) (per pt)  BEHAVIORAL SYMPTOMS/MOOD NEUROLOGICAL BOWEL NUTRITION STATUS   (none)  (none) Incontinent Diet (see d/c summary)  AMBULATORY STATUS COMMUNICATION OF NEEDS Skin   Extensive Assist Verbally Normal                       Personal Care Assistance Level of Assistance  Bathing, Feeding, Dressing Bathing Assistance: Maximum assistance Feeding assistance: Independent Dressing Assistance: Limited assistance     Functional Limitations Info  Sight, Hearing, Speech Sight Info: Adequate Hearing Info: Adequate Speech Info: Adequate    SPECIAL CARE FACTORS FREQUENCY  PT (By  licensed PT), OT (By licensed OT)     PT Frequency: 5X/W OT Frequency: 5X/W            Contractures Contractures Info: Not present    Additional Factors Info  Code Status, Allergies Code Status Info: full Allergies Info: NKA           Current Medications (04/15/2021):  This is the current hospital active medication list Current Facility-Administered Medications  Medication Dose Route Frequency Provider Last Rate Last Admin   0.9 %  sodium chloride infusion   Intravenous PRN Oswald Hillock, MD 10 mL/hr at 04/13/21 1528 250 mL at 04/13/21 1528   acetaminophen (TYLENOL) tablet 650 mg  650 mg Oral Q6H PRN Cherylann Ratel A, DO   650 mg at 04/12/21 1558   Or   acetaminophen (TYLENOL) suppository 650 mg  650 mg Rectal Q6H PRN Marylyn Ishihara, Tyrone A, DO       buPROPion (WELLBUTRIN XL) 24 hr tablet 300 mg  300 mg Oral Daily Blount, Xenia T, NP   300 mg at 04/15/21 [id number]   ceFAZolin (ANCEF) IVPB 2g/100 mL premix  2 g Intravenous Q8H Oswald Hillock, MD 200 mL/hr at 04/15/21 1405 2 g at 04/15/21 1405   enoxaparin (LOVENOX) injection 40 mg  40 mg Subcutaneous Q24H Kyle, Tyrone A, DO   40 mg at 04/15/21 [id number]   haloperidol (HALDOL) tablet 2 mg  2 mg Oral Once Oswald Hillock, MD       Or   haloperidol lactate (HALDOL) injection 2 mg  2  mg Intramuscular Once Oswald Hillock, MD       HYDROcodone-acetaminophen (NORCO/VICODIN) 5-325 MG per tablet 1-2 tablet  1-2 tablet Oral Q4H PRN Cherylann Ratel A, DO   2 tablet at 04/15/21 [id number]   insulin aspart (novoLOG) injection 0-15 Units  0-15 Units Subcutaneous TID WC Kyle, Tyrone A, DO   2 Units at 04/15/21 [id number]   insulin aspart (novoLOG) injection 0-5 Units  0-5 Units Subcutaneous QHS Marylyn Ishihara, Tyrone A, DO   2 Units at 04/12/21 2059   ipratropium-albuterol (DUONEB) 0.5-2.5 (3) MG/3ML nebulizer solution 3 mL  3 mL Nebulization Q6H PRN Oswald Hillock, MD   3 mL at 04/15/21 1509   ondansetron (ZOFRAN) tablet 4 mg  4 mg Oral Q6H PRN Cherylann Ratel A, DO       Or   ondansetron  (ZOFRAN) injection 4 mg  4 mg Intravenous Q6H PRN Marylyn Ishihara, Tyrone A, DO   4 mg at 04/13/21 1358   senna-docusate (Senokot-S) tablet 1 tablet  1 tablet Oral QHS PRN Cherylann Ratel A, DO         Discharge Medications: Please see discharge summary for a list of discharge medications.  Relevant Imaging Results:  Relevant Lab Results:   Additional Information [place], [place]

## 2021-04-15 NOTE — Progress Notes (Signed)
[facility]  PROGRESS NOTE  Michael Hines [id number] DOB: Nov 09, 1945 DOA: 04/12/2021 PCP: Finis Bud, MD   Brief HPI:   75 year old male with medical history of tobacco abuse, COPD, diabetes mellitus type 2 presented with fever and chills with shortness of breath and altered mental status.  Per wife, patient woke up middle of night was breathing heavily, shivering.  She checked his temperature which was 100.8.  EMS was called.  In the ED he was found to be tachycardic, tachypneic.  CT abdomen/pelvis was negative, mild leukocytosis, was found to be toxemic on room air. Patient was started on antibiotics and steroids for possible COPD exacerbation.   Subjective   Patient seen and examined, no new complaints.  Awaiting for TEE in a.m.   Assessment/Plan:    MSSA bacteremia -Patient started on IV cefazolin. -No clear source -ID recommends TEE, as patient has history of aortic valve replacement in 2013 -MRI lumbar spine only showed multilevel spondylosis, no discitis or abscess. - cardiology consulted for TEE; called and discussed with cardiology.  They will try to adjust him to tomorrow schedule for TEE. -We will keep him n.p.o. after midnight.  Metabolic encephalopathy/delirium -In setting of MSSA bacteremia -Haldol 2 mg p.o. x1 as needed ordered -Continue to monitor  Dyspnea -Resolved; likely was tachypneic from bacteremia -We discontinued prednisone  -Continue DuoNeb nebulization every 6 hours as needed  Recent falls -PT evaluation obtained -Recommended to go to rehab  Diabetes mellitus type 2 -Continue sliding scale insulin with NovoLog -CBG well controlled   Scheduled medications:    buPROPion  300 mg Oral Daily   enoxaparin (LOVENOX) injection  40 mg Subcutaneous Q24H   haloperidol  2 mg Oral Once   Or   haloperidol lactate  2 mg Intramuscular Once   insulin aspart  0-15 Units Subcutaneous TID WC   insulin aspart  0-5 Units Subcutaneous QHS          Data Reviewed:   CBG:  Recent Labs  Lab 04/14/21 0755 04/14/21 1211 04/14/21 1637 04/15/21 0739 04/15/21 1141  GLUCAP 137* 122* 110* 126* 95    SpO2: 95 % O2 Flow Rate (L/min): 2 L/min    Vitals:   04/14/21 2026 04/15/21 0552 04/15/21 1304 04/15/21 1510  BP: (!) 148/109 (!) 156/83 (!) 135/98   Pulse: 94 81 83   Resp: '18 20 19   '$ Temp: 97.7 F (36.5 C) 97.7 F (36.5 C) 98 F (36.7 C)   TempSrc: Oral Oral Oral   SpO2: 92% 93% 94% 95%  Weight:      Height:         Intake/Output Summary (Last 24 hours) at 04/15/2021 1537 Last data filed at 04/15/2021 1309 Gross per 24 hour  Intake 236 ml  Output 500 ml  Net -264 ml    09/03 1901 - 09/05 0700 In: 237.4 [P.O.:100; I.V.:37.4] Out: 550 [Urine:550]  Filed Weights   04/13/21 1527  Weight: 94.2 kg    CBC:  Recent Labs  Lab 04/12/21 0423 04/12/21 0959 04/13/21 0556 04/14/21 0537  WBC 11.4* 11.6* 10.7* 6.0  HGB 16.9 15.8 13.6 15.1  HCT 52.1* 47.6 40.3 44.7  PLT 187 162 151 122*  MCV 95.9 94.8 92.2 92.5  MCH 31.1 31.5 31.1 31.3  MCHC 32.4 33.2 33.7 33.8  RDW 15.0 15.0 14.6 15.1  LYMPHSABS 0.9  --   --   --   MONOABS 0.7  --   --   --   EOSABS 0.0  --   --   --  BASOSABS 0.1  --   --   --     Complete metabolic panel:  Recent Labs  Lab 04/12/21 0423 04/12/21 0616 04/12/21 0959 04/13/21 0556 04/14/21 0537  NA 137  --   --  138 136  K 4.3  --   --  3.6 3.6  CL 100  --   --  105 100  CO2 29  --   --  26 25  GLUCOSE 123*  --   --  195* 103*  BUN 15  --   --  19 24*  CREATININE 1.11  --  0.96 0.79 1.00  CALCIUM 8.8*  --   --  8.8* 8.7*  AST 16  --   --  18 29  ALT 18  --   --  22 28  ALKPHOS 81  --   --  48 49  BILITOT 0.7  --   --  0.6 0.7  ALBUMIN 3.8  --   --  2.9* 3.0*  LATICACIDVEN 1.5 1.0  --   --   --   INR 0.8  --   --   --   --   HGBA1C  --   --  7.9*  --   --     No results for input(s): LIPASE, AMYLASE in the last 168 hours.  Recent Labs  Lab 04/12/21 0705   SARSCOV2NAA NEGATIVE    ------------------------------------------------------------------------------------------------------------------ No results for input(s): CHOL, HDL, LDLCALC, TRIG, CHOLHDL, LDLDIRECT in the last 72 hours.  Lab Results  Component Value Date   HGBA1C 7.9 (H) 04/12/2021   ------------------------------------------------------------------------------------------------------------------ No results for input(s): TSH, T4TOTAL, T3FREE, THYROIDAB in the last 72 hours.  Invalid input(s): FREET3 ------------------------------------------------------------------------------------------------------------------ No results for input(s): VITAMINB12, FOLATE, FERRITIN, TIBC, IRON, RETICCTPCT in the last 72 hours.  Coagulation profile Recent Labs  Lab 04/12/21 0423  INR 0.8   No results for input(s): DDIMER in the last 72 hours.  Cardiac Enzymes No results for input(s): CKTOTAL, CKMB, CKMBINDEX, TROPONINI in the last 168 hours.  ------------------------------------------------------------------------------------------------------------------    Component Value Date/Time   BNP 74.6 12/04/2019 2057     Antibiotics: Anti-infectives (From admission, onward)    Start     Dose/Rate Route Frequency Ordered Stop   04/13/21 2200  ceFAZolin (ANCEF) IVPB 2g/100 mL premix        2 g 200 mL/hr over 30 Minutes Intravenous Every 8 hours 04/13/21 1452     04/13/21 1500  ceFAZolin (ANCEF) IVPB 2g/100 mL premix        2 g 200 mL/hr over 30 Minutes Intravenous STAT 04/13/21 1452 04/14/21 1146   04/12/21 1000  cefTRIAXone (ROCEPHIN) 1 g in sodium chloride 0.9 % 100 mL IVPB  Status:  Discontinued        1 g 200 mL/hr over 30 Minutes Intravenous Every 24 hours 04/12/21 0958 04/13/21 1452   04/12/21 0445  ceFEPIme (MAXIPIME) 2 g in sodium chloride 0.9 % 100 mL IVPB        2 g 200 mL/hr over 30 Minutes Intravenous  Once 04/12/21 0436 04/12/21 0520        Radiology  Reports  No results found.    DVT prophylaxis: Lovenox  Code Status: Full code  Family Communication: Wife at bedside   Consultants: Infectious disease  Procedures:     Objective    Physical Examination:  General-appears in no acute distress Heart-S1-S2, regular, no murmur auscultated Lungs-clear to auscultation bilaterally, no wheezing or crackles auscultated Abdomen-soft, nontender,  no organomegaly Extremities-no edema in the lower extremities Neuro-alert, oriented x3, no focal deficit noted   Status is: Inpatient  Dispo: The patient is from: Home              Anticipated d/c is to: Skilled nursing facility              Anticipated d/c date is: 04/17/2021              Patient currently not stable for discharge  Barrier to discharge-work-up for MSSA bacteremia  COVID-19 Labs  No results for input(s): DDIMER, FERRITIN, LDH, CRP in the last 72 hours.  Lab Results  Component Value Date   [place] NEGATIVE 04/12/2021    Microbiology  Recent Results (from the past 240 hour(s))  Blood culture (routine x 2)     Status: Abnormal   Collection Time: 04/12/21  4:23 AM   Specimen: BLOOD  Result Value Ref Range Status   Specimen Description   Final    BLOOD BLOOD LEFT HAND Performed at [facility], [place] [address] [place], [place] [address]    Special Requests   Final    BOTTLES DRAWN AEROBIC AND ANAEROBIC Blood Culture adequate volume Performed at [place] [address], [place], [place] [address]    Culture  Setup Time   Final    GRAM POSITIVE COCCI IN CLUSTERS IN BOTH AEROBIC AND ANAEROBIC BOTTLES CRITICAL VALUE NOTED.  VALUE IS CONSISTENT WITH PREVIOUSLY REPORTED AND CALLED VALUE.    Culture (A)  Final    STAPHYLOCOCCUS AUREUS SUSCEPTIBILITIES PERFORMED ON PREVIOUS CULTURE WITHIN THE LAST 5 DAYS. Performed at [facility], [place] [address] [place], [place] [address]    Report Status 04/15/2021  FINAL  Final  Blood culture (routine x 2)     Status: Abnormal   Collection Time: 04/12/21  4:24 AM   Specimen: Right Antecubital; Blood  Result Value Ref Range Status   Specimen Description   Final    RIGHT ANTECUBITAL Performed at [place] [address] [place], [place] [address]    Special Requests   Final    BOTTLES DRAWN AEROBIC AND ANAEROBIC Blood Culture results may not be optimal due to an inadequate volume of blood received in culture bottles Performed at [place] [address] [place], [place] [address]    Culture  Setup Time   Final    GRAM POSITIVE COCCI IN CLUSTERS IN BOTH AEROBIC AND ANAEROBIC BOTTLES CRITICAL RESULT CALLED TO, READ BACK BY AND VERIFIED WITH: L POINDEXTER,PHARMD'@2250'$  04/12/21 [place] Performed at [facility], [place] [address] [place], [place] [address]    Culture STAPHYLOCOCCUS AUREUS (A)  Final   Report Status 04/15/2021 FINAL  Final   Organism ID, Bacteria STAPHYLOCOCCUS AUREUS  Final      Susceptibility   Staphylococcus aureus - MIC*    CIPROFLOXACIN <=0.5 SENSITIVE Sensitive     ERYTHROMYCIN <=0.25 SENSITIVE Sensitive     GENTAMICIN <=0.5 SENSITIVE Sensitive     OXACILLIN <=0.25 SENSITIVE Sensitive     TETRACYCLINE <=1 SENSITIVE Sensitive     VANCOMYCIN 1 SENSITIVE Sensitive     TRIMETH/SULFA <=10 SENSITIVE Sensitive     CLINDAMYCIN <=0.25 SENSITIVE Sensitive     RIFAMPIN <=0.5 SENSITIVE Sensitive     Inducible Clindamycin NEGATIVE Sensitive     * STAPHYLOCOCCUS AUREUS  Blood Culture ID Panel (Reflexed)     Status: Abnormal   Collection Time: 04/12/21  4:24 AM  Result Value Ref Range Status   Enterococcus faecalis NOT DETECTED NOT DETECTED Final   Enterococcus Faecium NOT DETECTED NOT DETECTED Final   Listeria monocytogenes NOT DETECTED NOT DETECTED Final   Staphylococcus species DETECTED (A) NOT DETECTED Final    Comment: CRITICAL RESULT CALLED TO, READ BACK BY AND VERIFIED WITH: L  POINDEXTER,PHARMD'@2250'$  04/12/21 [place]    Staphylococcus aureus (BCID) DETECTED (A) NOT DETECTED Final    Comment: CRITICAL RESULT CALLED TO, READ BACK BY AND VERIFIED WITH: L POINDEXTER,PHARMD'@2250'$  04/12/21 [place]    Staphylococcus epidermidis NOT DETECTED NOT DETECTED Final   Staphylococcus lugdunensis NOT DETECTED NOT DETECTED Final   Streptococcus species NOT DETECTED NOT DETECTED Final   Streptococcus agalactiae NOT DETECTED NOT DETECTED Final   Streptococcus pneumoniae NOT DETECTED NOT DETECTED Final   Streptococcus pyogenes NOT DETECTED NOT DETECTED Final   A.calcoaceticus-baumannii NOT DETECTED NOT DETECTED Final   Bacteroides fragilis NOT DETECTED NOT DETECTED Final   Enterobacterales NOT DETECTED NOT DETECTED Final   Enterobacter cloacae complex NOT DETECTED NOT DETECTED Final   Escherichia coli NOT DETECTED NOT DETECTED Final   Klebsiella aerogenes NOT DETECTED NOT DETECTED Final   Klebsiella oxytoca NOT DETECTED NOT DETECTED Final   Klebsiella pneumoniae NOT DETECTED NOT DETECTED Final   Proteus species NOT DETECTED NOT DETECTED Final   Salmonella species NOT DETECTED NOT DETECTED Final   Serratia marcescens NOT DETECTED NOT DETECTED Final   Haemophilus influenzae NOT DETECTED NOT DETECTED Final   Neisseria meningitidis NOT DETECTED NOT DETECTED Final   Pseudomonas aeruginosa NOT DETECTED NOT DETECTED Final   Stenotrophomonas maltophilia NOT DETECTED NOT DETECTED Final   Candida albicans NOT DETECTED NOT DETECTED Final   Candida auris NOT DETECTED NOT DETECTED Final   Candida glabrata NOT DETECTED NOT DETECTED Final   Candida krusei NOT DETECTED NOT DETECTED Final   Candida parapsilosis NOT DETECTED NOT DETECTED Final   Candida tropicalis NOT DETECTED NOT DETECTED Final   Cryptococcus neoformans/gattii NOT DETECTED NOT DETECTED Final   Meth resistant mecA/C and MREJ NOT DETECTED NOT DETECTED Final    Comment: Performed at [facility], [address], [place], [place]  [address]  Resp Panel by RT-PCR (Flu A&B, Covid) Nasopharyngeal Swab     Status: None   Collection Time: 04/12/21  7:05 AM   Specimen: Nasopharyngeal Swab; Nasopharyngeal(NP) swabs in vial transport medium  Result Value Ref Range Status   SARS Coronavirus 2 by RT PCR NEGATIVE NEGATIVE Final    Comment: (NOTE) SARS-CoV-2 target nucleic acids are NOT DETECTED.  The SARS-CoV-2 RNA is generally detectable in upper respiratory specimens during the acute phase of infection. The lowest concentration of SARS-CoV-2 viral copies this assay can detect is 138 copies/mL. A negative result does not preclude SARS-Cov-2 infection and should not be used as the sole basis for treatment or other patient management decisions. A negative result may occur with  improper specimen collection/handling, submission of specimen other than nasopharyngeal swab, presence of viral mutation(s) within the areas targeted by this assay, and inadequate number of viral copies(<138 copies/mL). A negative result must be combined with clinical observations, patient history, and epidemiological information. The expected result is Negative.  Fact Sheet for Patients:  EntrepreneurPulse.com.au  Fact Sheet for Healthcare Providers:  IncredibleEmployment.be  This test is no t yet approved or cleared by the [place] FDA and  has been authorized for detection and/or diagnosis of SARS-CoV-2 by FDA under an Emergency Use Authorization (EUA). This EUA will remain  in effect (meaning this test can be used) for the duration of the COVID-19 declaration under Section 564(b)(1) of the Act, 21 U.S.C.section 360bbb-3(b)(1), unless the authorization is terminated  or revoked sooner.       Influenza A by PCR NEGATIVE NEGATIVE Final   Influenza B by PCR NEGATIVE NEGATIVE Final    Comment: (NOTE) The Xpert Xpress SARS-CoV-2/FLU/RSV plus assay is intended as an aid in the diagnosis of influenza from  Nasopharyngeal swab specimens and should not be used as a sole basis for treatment. Nasal washings and aspirates are unacceptable for Xpert Xpress SARS-CoV-2/FLU/RSV testing.  Fact Sheet for Patients: EntrepreneurPulse.com.au  Fact Sheet for Healthcare Providers: IncredibleEmployment.be  This test is not yet approved or cleared by the [place] FDA and has been authorized for detection and/or diagnosis of SARS-CoV-2 by FDA under an Emergency Use Authorization (EUA). This EUA will remain in effect (meaning this test can be used) for the duration of the COVID-19 declaration under Section 564(b)(1) of the Act, 21 U.S.C. section 360bbb-3(b)(1), unless the authorization is terminated or revoked.  Performed at [facility], [place] [address] [place], [place] [address]   Respiratory (~20 pathogens) panel by PCR     Status: None   Collection Time: 04/12/21  9:59 AM   Specimen: Nasopharyngeal Swab; Respiratory  Result Value Ref Range Status   Adenovirus NOT DETECTED NOT DETECTED Final   Coronavirus 229E NOT DETECTED NOT DETECTED Final    Comment: (NOTE) The Coronavirus on the Respiratory Panel, DOES NOT test for the novel  Coronavirus (2019 nCoV)    Coronavirus HKU1 NOT DETECTED NOT DETECTED Final   Coronavirus NL63 NOT DETECTED NOT DETECTED Final   Coronavirus OC43 NOT DETECTED NOT DETECTED Final   Metapneumovirus NOT DETECTED NOT DETECTED Final   Rhinovirus / Enterovirus NOT DETECTED NOT DETECTED Final   Influenza A NOT DETECTED NOT DETECTED Final   Influenza B NOT DETECTED NOT DETECTED Final   Parainfluenza Virus 1 NOT DETECTED NOT DETECTED Final   Parainfluenza Virus 2 NOT DETECTED NOT DETECTED Final   Parainfluenza Virus 3 NOT DETECTED NOT DETECTED Final   Parainfluenza Virus 4 NOT DETECTED NOT DETECTED Final   Respiratory Syncytial Virus NOT DETECTED NOT DETECTED Final   Bordetella pertussis NOT DETECTED NOT DETECTED  Final   Bordetella Parapertussis NOT DETECTED NOT DETECTED Final   Chlamydophila pneumoniae NOT DETECTED NOT DETECTED Final   Mycoplasma pneumoniae NOT DETECTED NOT DETECTED Final    Comment: Performed at [facility], [place]. [address] [place], [place] [address]  Culture, blood (routine x 2)     Status: None (Preliminary result)   Collection Time: 04/14/21  5:37 AM   Specimen: BLOOD  Result Value Ref Range Status   Specimen Description   Final    BLOOD LEFT ANTECUBITAL Performed at [place] [address], [place], [place] [address]    Special Requests   Final    BOTTLES DRAWN AEROBIC ONLY Blood Culture adequate volume Performed at [place] [address], [place], [place] [address]    Culture   Final    NO GROWTH < 24 HOURS Performed at [place] [address] [place], [place] [address]    Report Status PENDING  Incomplete  Culture, blood (routine x 2)     Status: None (Preliminary result)   Collection Time: 04/14/21  5:38 AM   Specimen: BLOOD  Result Value Ref Range Status   Specimen Description  Final    BLOOD BLOOD LEFT FOREARM Performed at [place] [address] [place], [place] [address]    Special Requests   Final    BOTTLES DRAWN AEROBIC AND ANAEROBIC Blood Culture adequate volume Performed at [place] [address], [place], [place] [address]    Culture   Final    NO GROWTH < 24 HOURS Performed at [place] [address], [place], [place] [address]    Report Status PENDING  Incomplete       Oswald Hillock   [facility] If 7PM-7AM, please contact night-coverage at [email or web address], Office  [phone]   04/15/2021, 3:37 PM  LOS: 2 days

## 2021-04-16 DIAGNOSIS — Z952 Presence of prosthetic heart valve: Secondary | ICD-10-CM

## 2021-04-16 DIAGNOSIS — R4182 Altered mental status, unspecified: Secondary | ICD-10-CM | POA: Diagnosis not present

## 2021-04-16 DIAGNOSIS — B9561 Methicillin susceptible Staphylococcus aureus infection as the cause of diseases classified elsewhere: Secondary | ICD-10-CM

## 2021-04-16 DIAGNOSIS — M545 Low back pain, unspecified: Secondary | ICD-10-CM

## 2021-04-16 DIAGNOSIS — R7881 Bacteremia: Secondary | ICD-10-CM | POA: Diagnosis not present

## 2021-04-16 DIAGNOSIS — G20 Parkinson's disease: Secondary | ICD-10-CM

## 2021-04-16 DIAGNOSIS — J9601 Acute respiratory failure with hypoxia: Secondary | ICD-10-CM

## 2021-04-16 DIAGNOSIS — E1142 Type 2 diabetes mellitus with diabetic polyneuropathy: Secondary | ICD-10-CM

## 2021-04-16 DIAGNOSIS — G8929 Other chronic pain: Secondary | ICD-10-CM

## 2021-04-16 LAB — GLUCOSE, CAPILLARY
Glucose-Capillary: 102 mg/dL — ABNORMAL HIGH (ref 70–99)
Glucose-Capillary: 107 mg/dL — ABNORMAL HIGH (ref 70–99)
Glucose-Capillary: 104 mg/dL — ABNORMAL HIGH (ref 70–99)
Glucose-Capillary: 98 mg/dL (ref 70–99)

## 2021-04-16 NOTE — Care Management Important Message (Signed)
Important Message  Patient Details IM Letter given to the Patient. Name: Michael Hines MRN: [id number] Date of Birth: Apr 23, 1946   Medicare Important Message Given:        Kerin Salen 04/16/2021, 12:41 PM

## 2021-04-16 NOTE — H&P (View-Only) (Signed)
[facility]  PROGRESS NOTE  Michael Hines [id number] DOB: 03-07-1946 DOA: 04/12/2021 PCP: Finis Bud, MD   Brief HPI:   75 year old male with medical history of tobacco abuse, COPD, diabetes mellitus type 2 presented with fever and chills with shortness of breath and altered mental status.  Per wife, patient woke up middle of night was breathing heavily, shivering.  She checked his temperature which was 100.8.  EMS was called.  In the ED he was found to be tachycardic, tachypneic.  CT abdomen/pelvis was negative, mild leukocytosis, was found to be toxemic on room air.  Blood cultures grew MSSA, started on IV cefazolin.  ID consulted   Subjective   TEE rescheduled for Friday.   Assessment/Plan:    MSSA bacteremia -Patient started on IV cefazolin. -No clear source; ID consulted -ID recommends TEE, as patient has history of aortic valve replacement in 2013 -MRI lumbar spine only showed multilevel spondylosis, no discitis or abscess. - cardiology consulted for TEE; called and discussed with cardiology.  TEE was supposed to be done today, however cardiology has  rescheduled it on Friday [id number]  Metabolic encephalopathy/delirium -In setting of MSSA bacteremia -Haldol 2 mg p.o. x1 as needed ordered -Continue to monitor  Dyspnea -Resolved; likely was tachypneic from bacteremia -We discontinued prednisone  -Continue DuoNeb nebulization every 6 hours as needed  Recent falls -PT evaluation obtained -Recommended to go to rehab  Diabetes mellitus type 2 -Continue sliding scale insulin with NovoLog -CBG well controlled   Scheduled medications:    buPROPion  300 mg Oral Daily   enoxaparin (LOVENOX) injection  40 mg Subcutaneous Q24H   haloperidol  2 mg Oral Once   Or   haloperidol lactate  2 mg Intramuscular Once   insulin aspart  0-15 Units Subcutaneous TID WC   insulin aspart  0-5 Units Subcutaneous QHS         Data Reviewed:   CBG:  Recent  Labs  Lab 04/15/21 1141 04/15/21 1640 04/15/21 2025 04/16/21 0752 04/16/21 1143  GLUCAP 95 117* 104* 102* 107*    SpO2: 96 % O2 Flow Rate (L/min): 2 L/min    Vitals:   04/15/21 1304 04/15/21 1510 04/15/21 2020 04/16/21 0506  BP: (!) 135/98  (!) 143/93 (!) 151/96  Pulse: 83  91 78  Resp: '19  18 20  '$ Temp: 98 F (36.7 C)  98.9 F (37.2 C) 98.2 F (36.8 C)  TempSrc: Oral  Oral Oral  SpO2: 94% 95% 93% 96%  Weight:      Height:         Intake/Output Summary (Last 24 hours) at 04/16/2021 1528 Last data filed at 04/16/2021 0444 Gross per 24 hour  Intake 1533.82 ml  Output 1000 ml  Net 533.82 ml    09/04 1901 - 09/06 0700 In: 1769.8 [P.O.:356; I.V.:913.8] Out: 1500 [Urine:1500]  Filed Weights   04/13/21 1527  Weight: 94.2 kg    CBC:  Recent Labs  Lab 04/12/21 0423 04/12/21 0959 04/13/21 0556 04/14/21 0537  WBC 11.4* 11.6* 10.7* 6.0  HGB 16.9 15.8 13.6 15.1  HCT 52.1* 47.6 40.3 44.7  PLT 187 162 151 122*  MCV 95.9 94.8 92.2 92.5  MCH 31.1 31.5 31.1 31.3  MCHC 32.4 33.2 33.7 33.8  RDW 15.0 15.0 14.6 15.1  LYMPHSABS 0.9  --   --   --   MONOABS 0.7  --   --   --   EOSABS 0.0  --   --   --  BASOSABS 0.1  --   --   --     Complete metabolic panel:  Recent Labs  Lab 04/12/21 0423 04/12/21 0616 04/12/21 0959 04/13/21 0556 04/14/21 0537  NA 137  --   --  138 136  K 4.3  --   --  3.6 3.6  CL 100  --   --  105 100  CO2 29  --   --  26 25  GLUCOSE 123*  --   --  195* 103*  BUN 15  --   --  19 24*  CREATININE 1.11  --  0.96 0.79 1.00  CALCIUM 8.8*  --   --  8.8* 8.7*  AST 16  --   --  18 29  ALT 18  --   --  22 28  ALKPHOS 81  --   --  48 49  BILITOT 0.7  --   --  0.6 0.7  ALBUMIN 3.8  --   --  2.9* 3.0*  LATICACIDVEN 1.5 1.0  --   --   --   INR 0.8  --   --   --   --   HGBA1C  --   --  7.9*  --   --     No results for input(s): LIPASE, AMYLASE in the last 168 hours.  Recent Labs  Lab 04/12/21 0705  SARSCOV2NAA NEGATIVE     ------------------------------------------------------------------------------------------------------------------ No results for input(s): CHOL, HDL, LDLCALC, TRIG, CHOLHDL, LDLDIRECT in the last 72 hours.  Lab Results  Component Value Date   HGBA1C 7.9 (H) 04/12/2021   ------------------------------------------------------------------------------------------------------------------ No results for input(s): TSH, T4TOTAL, T3FREE, THYROIDAB in the last 72 hours.  Invalid input(s): FREET3 ------------------------------------------------------------------------------------------------------------------ No results for input(s): VITAMINB12, FOLATE, FERRITIN, TIBC, IRON, RETICCTPCT in the last 72 hours.  Coagulation profile Recent Labs  Lab 04/12/21 0423  INR 0.8   No results for input(s): DDIMER in the last 72 hours.  Cardiac Enzymes No results for input(s): CKTOTAL, CKMB, CKMBINDEX, TROPONINI in the last 168 hours.  ------------------------------------------------------------------------------------------------------------------    Component Value Date/Time   BNP 74.6 12/04/2019 2057     Antibiotics: Anti-infectives (From admission, onward)    Start     Dose/Rate Route Frequency Ordered Stop   04/13/21 2200  ceFAZolin (ANCEF) IVPB 2g/100 mL premix        2 g 200 mL/hr over 30 Minutes Intravenous Every 8 hours 04/13/21 1452     04/13/21 1500  ceFAZolin (ANCEF) IVPB 2g/100 mL premix        2 g 200 mL/hr over 30 Minutes Intravenous STAT 04/13/21 1452 04/14/21 1146   04/12/21 1000  cefTRIAXone (ROCEPHIN) 1 g in sodium chloride 0.9 % 100 mL IVPB  Status:  Discontinued        1 g 200 mL/hr over 30 Minutes Intravenous Every 24 hours 04/12/21 0958 04/13/21 1452   04/12/21 0445  ceFEPIme (MAXIPIME) 2 g in sodium chloride 0.9 % 100 mL IVPB        2 g 200 mL/hr over 30 Minutes Intravenous  Once 04/12/21 0436 04/12/21 0520        Radiology Reports  No results  found.    DVT prophylaxis: Lovenox  Code Status: Full code  Family Communication: Wife at bedside   Consultants: Infectious disease  Procedures:     Objective    Physical Examination:  General-appears in no acute distress Heart-S1-S2, regular, no murmur auscultated Lungs-clear to auscultation bilaterally, no wheezing or crackles auscultated Abdomen-soft, nontender,  no organomegaly Extremities-no edema in the lower extremities Neuro-alert, oriented x 2.  No focal deficit noted has made life easier  Status is: Inpatient  Dispo: The patient is from: Home              Anticipated d/c is to: Skilled nursing facility              Anticipated d/c date is: 04/17/2021              Patient currently not stable for discharge  Barrier to discharge-work-up for MSSA bacteremia  COVID-19 Labs  No results for input(s): DDIMER, FERRITIN, LDH, CRP in the last 72 hours.  Lab Results  Component Value Date   [place] NEGATIVE 04/12/2021    Microbiology  Recent Results (from the past 240 hour(s))  Blood culture (routine x 2)     Status: Abnormal   Collection Time: 04/12/21  4:23 AM   Specimen: BLOOD  Result Value Ref Range Status   Specimen Description   Final    BLOOD BLOOD LEFT HAND Performed at [facility], [place] [address] [place] [address], [place], [place] [address]    Special Requests   Final    BOTTLES DRAWN AEROBIC AND ANAEROBIC Blood Culture adequate volume Performed at [place] [address] [place], [place] [address]    Culture  Setup Time   Final    GRAM POSITIVE COCCI IN CLUSTERS IN BOTH AEROBIC AND ANAEROBIC BOTTLES CRITICAL VALUE NOTED.  VALUE IS CONSISTENT WITH PREVIOUSLY REPORTED AND CALLED VALUE.    Culture (A)  Final    STAPHYLOCOCCUS AUREUS SUSCEPTIBILITIES PERFORMED ON PREVIOUS CULTURE WITHIN THE LAST 5 DAYS. Performed at [facility], [place] [address] [place], [place] [address]    Report Status 04/15/2021 FINAL   Final  Blood culture (routine x 2)     Status: Abnormal   Collection Time: 04/12/21  4:24 AM   Specimen: Right Antecubital; Blood  Result Value Ref Range Status   Specimen Description   Final    RIGHT ANTECUBITAL Performed at [place] [address], [place], [place] [address]    Special Requests   Final    BOTTLES DRAWN AEROBIC AND ANAEROBIC Blood Culture results may not be optimal due to an inadequate volume of blood received in culture bottles Performed at [place] [address], [place], [place] [address]    Culture  Setup Time   Final    GRAM POSITIVE COCCI IN CLUSTERS IN BOTH AEROBIC AND ANAEROBIC BOTTLES CRITICAL RESULT CALLED TO, READ BACK BY AND VERIFIED WITH: L POINDEXTER,PHARMD'@2250'$  04/12/21 [place] Performed at [facility], [place] [address] [place], [place] [address]    Culture STAPHYLOCOCCUS AUREUS (A)  Final   Report Status 04/15/2021 FINAL  Final   Organism ID, Bacteria STAPHYLOCOCCUS AUREUS  Final      Susceptibility   Staphylococcus aureus - MIC*    CIPROFLOXACIN <=0.5 SENSITIVE Sensitive     ERYTHROMYCIN <=0.25 SENSITIVE Sensitive     GENTAMICIN <=0.5 SENSITIVE Sensitive     OXACILLIN <=0.25 SENSITIVE Sensitive     TETRACYCLINE <=1 SENSITIVE Sensitive     VANCOMYCIN 1 SENSITIVE Sensitive     TRIMETH/SULFA <=10 SENSITIVE Sensitive     CLINDAMYCIN <=0.25 SENSITIVE Sensitive     RIFAMPIN <=0.5 SENSITIVE Sensitive     Inducible Clindamycin NEGATIVE Sensitive     * STAPHYLOCOCCUS AUREUS  Blood Culture ID Panel (Reflexed)     Status: Abnormal  Collection Time: 04/12/21  4:24 AM  Result Value Ref Range Status   Enterococcus faecalis NOT DETECTED NOT DETECTED Final   Enterococcus Faecium NOT DETECTED NOT DETECTED Final   Listeria monocytogenes NOT DETECTED NOT DETECTED Final   Staphylococcus species DETECTED (A) NOT DETECTED Final    Comment: CRITICAL RESULT CALLED TO, READ BACK BY AND VERIFIED WITH: L  POINDEXTER,PHARMD'@2250'$  04/12/21 [place]    Staphylococcus aureus (BCID) DETECTED (A) NOT DETECTED Final    Comment: CRITICAL RESULT CALLED TO, READ BACK BY AND VERIFIED WITH: L POINDEXTER,PHARMD'@2250'$  04/12/21 [place]    Staphylococcus epidermidis NOT DETECTED NOT DETECTED Final   Staphylococcus lugdunensis NOT DETECTED NOT DETECTED Final   Streptococcus species NOT DETECTED NOT DETECTED Final   Streptococcus agalactiae NOT DETECTED NOT DETECTED Final   Streptococcus pneumoniae NOT DETECTED NOT DETECTED Final   Streptococcus pyogenes NOT DETECTED NOT DETECTED Final   A.calcoaceticus-baumannii NOT DETECTED NOT DETECTED Final   Bacteroides fragilis NOT DETECTED NOT DETECTED Final   Enterobacterales NOT DETECTED NOT DETECTED Final   Enterobacter cloacae complex NOT DETECTED NOT DETECTED Final   Escherichia coli NOT DETECTED NOT DETECTED Final   Klebsiella aerogenes NOT DETECTED NOT DETECTED Final   Klebsiella oxytoca NOT DETECTED NOT DETECTED Final   Klebsiella pneumoniae NOT DETECTED NOT DETECTED Final   Proteus species NOT DETECTED NOT DETECTED Final   Salmonella species NOT DETECTED NOT DETECTED Final   Serratia marcescens NOT DETECTED NOT DETECTED Final   Haemophilus influenzae NOT DETECTED NOT DETECTED Final   Neisseria meningitidis NOT DETECTED NOT DETECTED Final   Pseudomonas aeruginosa NOT DETECTED NOT DETECTED Final   Stenotrophomonas maltophilia NOT DETECTED NOT DETECTED Final   Candida albicans NOT DETECTED NOT DETECTED Final   Candida auris NOT DETECTED NOT DETECTED Final   Candida glabrata NOT DETECTED NOT DETECTED Final   Candida krusei NOT DETECTED NOT DETECTED Final   Candida parapsilosis NOT DETECTED NOT DETECTED Final   Candida tropicalis NOT DETECTED NOT DETECTED Final   Cryptococcus neoformans/gattii NOT DETECTED NOT DETECTED Final   Meth resistant mecA/C and MREJ NOT DETECTED NOT DETECTED Final    Comment: Performed at [facility], [address] [place], [place]  [address]  Resp Panel by RT-PCR (Flu A&B, Covid) Nasopharyngeal Swab     Status: None   Collection Time: 04/12/21  7:05 AM   Specimen: Nasopharyngeal Swab; Nasopharyngeal(NP) swabs in vial transport medium  Result Value Ref Range Status   SARS Coronavirus 2 by RT PCR NEGATIVE NEGATIVE Final    Comment: (NOTE) SARS-CoV-2 target nucleic acids are NOT DETECTED.  The SARS-CoV-2 RNA is generally detectable in upper respiratory specimens during the acute phase of infection. The lowest concentration of SARS-CoV-2 viral copies this assay can detect is 138 copies/mL. A negative result does not preclude SARS-Cov-2 infection and should not be used as the sole basis for treatment or other patient management decisions. A negative result may occur with  improper specimen collection/handling, submission of specimen other than nasopharyngeal swab, presence of viral mutation(s) within the areas targeted by this assay, and inadequate number of viral copies(<138 copies/mL). A negative result must be combined with clinical observations, patient history, and epidemiological information. The expected result is Negative.  Fact Sheet for Patients:  EntrepreneurPulse.com.au  Fact Sheet for Healthcare Providers:  IncredibleEmployment.be  This test is no t yet approved or cleared by the [place] FDA and  has been authorized for detection and/or diagnosis of SARS-CoV-2 by FDA under an Emergency Use Authorization (EUA).  This EUA will remain  in effect (meaning this test can be used) for the duration of the COVID-19 declaration under Section 564(b)(1) of the Act, 21 U.S.C.section 360bbb-3(b)(1), unless the authorization is terminated  or revoked sooner.       Influenza A by PCR NEGATIVE NEGATIVE Final   Influenza B by PCR NEGATIVE NEGATIVE Final    Comment: (NOTE) The Xpert Xpress SARS-CoV-2/FLU/RSV plus assay is intended as an aid in the diagnosis of influenza from  Nasopharyngeal swab specimens and should not be used as a sole basis for treatment. Nasal washings and aspirates are unacceptable for Xpert Xpress SARS-CoV-2/FLU/RSV testing.  Fact Sheet for Patients: EntrepreneurPulse.com.au  Fact Sheet for Healthcare Providers: IncredibleEmployment.be  This test is not yet approved or cleared by the [place] FDA and has been authorized for detection and/or diagnosis of SARS-CoV-2 by FDA under an Emergency Use Authorization (EUA). This EUA will remain in effect (meaning this test can be used) for the duration of the COVID-19 declaration under Section 564(b)(1) of the Act, 21 U.S.C. section 360bbb-3(b)(1), unless the authorization is terminated or revoked.  Performed at [facility], [place] [address] [place], [place] [address]   Respiratory (~20 pathogens) panel by PCR     Status: None   Collection Time: 04/12/21  9:59 AM   Specimen: Nasopharyngeal Swab; Respiratory  Result Value Ref Range Status   Adenovirus NOT DETECTED NOT DETECTED Final   Coronavirus 229E NOT DETECTED NOT DETECTED Final    Comment: (NOTE) The Coronavirus on the Respiratory Panel, DOES NOT test for the novel  Coronavirus (2019 nCoV)    Coronavirus HKU1 NOT DETECTED NOT DETECTED Final   Coronavirus NL63 NOT DETECTED NOT DETECTED Final   Coronavirus OC43 NOT DETECTED NOT DETECTED Final   Metapneumovirus NOT DETECTED NOT DETECTED Final   Rhinovirus / Enterovirus NOT DETECTED NOT DETECTED Final   Influenza A NOT DETECTED NOT DETECTED Final   Influenza B NOT DETECTED NOT DETECTED Final   Parainfluenza Virus 1 NOT DETECTED NOT DETECTED Final   Parainfluenza Virus 2 NOT DETECTED NOT DETECTED Final   Parainfluenza Virus 3 NOT DETECTED NOT DETECTED Final   Parainfluenza Virus 4 NOT DETECTED NOT DETECTED Final   Respiratory Syncytial Virus NOT DETECTED NOT DETECTED Final   Bordetella pertussis NOT DETECTED NOT DETECTED  Final   Bordetella Parapertussis NOT DETECTED NOT DETECTED Final   Chlamydophila pneumoniae NOT DETECTED NOT DETECTED Final   Mycoplasma pneumoniae NOT DETECTED NOT DETECTED Final    Comment: Performed at [facility], [place]. [address], [place], [place] [address]  Culture, blood (routine x 2)     Status: None (Preliminary result)   Collection Time: 04/14/21  5:37 AM   Specimen: BLOOD  Result Value Ref Range Status   Specimen Description   Final    BLOOD LEFT ANTECUBITAL Performed at [place] [address] [place], [place] [address]    Special Requests   Final    BOTTLES DRAWN AEROBIC ONLY Blood Culture adequate volume Performed at [place] [address] [place], [place] [address]    Culture   Final    NO GROWTH 2 DAYS Performed at [place] [address], [place], [place] [address]    Report Status PENDING  Incomplete  Culture, blood (routine x 2)     Status: None (Preliminary result)   Collection Time: 04/14/21  5:38 AM   Specimen: BLOOD  Result Value Ref Range Status  Specimen Description   Final    BLOOD BLOOD LEFT FOREARM Performed at [place] [address], [place], [place] [address]    Special Requests   Final    BOTTLES DRAWN AEROBIC AND ANAEROBIC Blood Culture adequate volume Performed at [place] [address] [place], [place] [address]    Culture   Final    NO GROWTH 2 DAYS Performed at [place] [address], [place],  [address]    Report Status PENDING  Incomplete       Oswald Hillock   [facility] If 7PM-7AM, please contact night-coverage at [email or web address], Office  [phone]   04/16/2021, 3:28 PM  LOS: 3 days

## 2021-04-16 NOTE — Progress Notes (Signed)
Subjective:  Low back pain   Antibiotics:  Anti-infectives (From admission, onward)    Start     Dose/Rate Route Frequency Ordered Stop   04/13/21 2200  ceFAZolin (ANCEF) IVPB 2g/100 mL premix        2 g 200 mL/hr over 30 Minutes Intravenous Every 8 hours 04/13/21 1452     04/13/21 1500  ceFAZolin (ANCEF) IVPB 2g/100 mL premix        2 g 200 mL/hr over 30 Minutes Intravenous STAT 04/13/21 1452 04/14/21 1146   04/12/21 1000  cefTRIAXone (ROCEPHIN) 1 g in sodium chloride 0.9 % 100 mL IVPB  Status:  Discontinued        1 g 200 mL/hr over 30 Minutes Intravenous Every 24 hours 04/12/21 0958 04/13/21 1452   04/12/21 0445  ceFEPIme (MAXIPIME) 2 g in sodium chloride 0.9 % 100 mL IVPB        2 g 200 mL/hr over 30 Minutes Intravenous  Once 04/12/21 0436 04/12/21 0520       Medications: Scheduled Meds:  buPROPion  300 mg Oral Daily   enoxaparin (LOVENOX) injection  40 mg Subcutaneous Q24H   haloperidol  2 mg Oral Once   Or   haloperidol lactate  2 mg Intramuscular Once   insulin aspart  0-15 Units Subcutaneous TID WC   insulin aspart  0-5 Units Subcutaneous QHS   Continuous Infusions:  sodium chloride 250 mL (04/13/21 1528)    ceFAZolin (ANCEF) IV 2 g (04/16/21 1314)   PRN Meds:.sodium chloride, acetaminophen **OR** acetaminophen, HYDROcodone-acetaminophen, ipratropium-albuterol, ondansetron **OR** ondansetron (ZOFRAN) IV, senna-docusate    Objective: Weight change:   Intake/Output Summary (Last 24 hours) at 04/16/2021 1730 Last data filed at 04/16/2021 0444 Gross per 24 hour  Intake 1013.82 ml  Output 1000 ml  Net 13.82 ml   Blood pressure (!) 151/96, pulse 78, temperature 98.2 F (36.8 C), temperature source Oral, resp. rate 20, height '5\' 7"'$  (1.702 m), weight 94.2 kg, SpO2 96 %. Temp:  [98.2 F (36.8 C)-98.9 F (37.2 C)] 98.2 F (36.8 C) (09/06 0506) Pulse Rate:  [78-91] 78 (09/06 0506) Resp:  [18-20] 20 (09/06 0506) BP: (143-151)/(93-96) 151/96 (09/06  0506) SpO2:  [93 %-96 %] 96 % (09/06 0506)  Physical Exam: Physical Exam Constitutional:      Appearance: He is well-developed.  HENT:     Head: Normocephalic and atraumatic.  Eyes:     General:        Right eye: No discharge.        Left eye: No discharge.     Conjunctiva/sclera: Conjunctivae normal.  Cardiovascular:     Rate and Rhythm: Normal rate and regular rhythm.     Heart sounds: Murmur heard.  Pulmonary:     Effort: Pulmonary effort is normal. No respiratory distress.     Breath sounds: Normal breath sounds. No stridor. No wheezing or rhonchi.  Abdominal:     General: There is no distension.     Palpations: Abdomen is soft. There is no mass.     Tenderness: There is no abdominal tenderness.  Musculoskeletal:        General: Normal range of motion.     Cervical back: Normal range of motion and neck supple.  Skin:    General: Skin is warm and dry.     Findings: No erythema or rash.  Neurological:     General: No focal deficit present.     Mental Status: He  is alert and oriented to person, place, and time.  Psychiatric:        Mood and Affect: Mood normal.        Behavior: Behavior normal.        Thought Content: Thought content normal.        Judgment: Judgment normal.     No diabetic foot ulcers  Multiple bruises and scars 04/16/2021:      CBC:    BMET Recent Labs    04/14/21 0537  NA 136  K 3.6  CL 100  CO2 25  GLUCOSE 103*  BUN 24*  CREATININE 1.00  CALCIUM 8.7*     Liver Panel  Recent Labs    04/14/21 0537  PROT 6.3*  ALBUMIN 3.0*  AST 29  ALT 28  ALKPHOS 49  BILITOT 0.7       Sedimentation Rate No results for input(s): ESRSEDRATE in the last 72 hours. C-Reactive Protein No results for input(s): CRP in the last 72 hours.  Micro Results: Recent Results (from the past 720 hour(s))  Blood culture (routine x 2)     Status: Abnormal   Collection Time: 04/12/21  4:23 AM   Specimen: BLOOD  Result Value Ref Range Status    Specimen Description   Final    BLOOD BLOOD LEFT HAND Performed at [facility] [address], [place], [place] [address]    Special Requests   Final    BOTTLES DRAWN AEROBIC AND ANAEROBIC Blood Culture adequate volume Performed at [facility] [address] [place], [place] [address]    Culture  Setup Time   Final    GRAM POSITIVE COCCI IN CLUSTERS IN BOTH AEROBIC AND ANAEROBIC BOTTLES CRITICAL VALUE NOTED.  VALUE IS CONSISTENT WITH PREVIOUSLY REPORTED AND CALLED VALUE.    Culture (A)  Final    STAPHYLOCOCCUS AUREUS SUSCEPTIBILITIES PERFORMED ON PREVIOUS CULTURE WITHIN THE LAST 5 DAYS. Performed at [facility], [place] [address] [place], [place] [address]    Report Status 04/15/2021 FINAL  Final  Blood culture (routine x 2)     Status: Abnormal   Collection Time: 04/12/21  4:24 AM   Specimen: Right Antecubital; Blood  Result Value Ref Range Status   Specimen Description   Final    RIGHT ANTECUBITAL Performed at [facility] [address], [place], [place] [address]    Special Requests   Final    BOTTLES DRAWN AEROBIC AND ANAEROBIC Blood Culture results may not be optimal due to an inadequate volume of blood received in culture bottles Performed at [facility] [address] [place], [place] [address]    Culture  Setup Time   Final    GRAM POSITIVE COCCI IN CLUSTERS IN BOTH AEROBIC AND ANAEROBIC BOTTLES CRITICAL RESULT CALLED TO, READ BACK BY AND VERIFIED WITH: L POINDEXTER,PHARMD'@2250'$  04/12/21 [place] Performed at [facility], [place] [address], [place],  [address]    Culture STAPHYLOCOCCUS AUREUS (A)  Final   Report Status 04/15/2021 FINAL  Final   Organism ID, Bacteria STAPHYLOCOCCUS AUREUS  Final      Susceptibility   Staphylococcus aureus - MIC*    CIPROFLOXACIN <=0.5 SENSITIVE Sensitive     ERYTHROMYCIN <=0.25 SENSITIVE Sensitive     GENTAMICIN <=0.5 SENSITIVE Sensitive      OXACILLIN <=0.25 SENSITIVE Sensitive     TETRACYCLINE <=1 SENSITIVE Sensitive     VANCOMYCIN 1 SENSITIVE Sensitive     TRIMETH/SULFA <=  10 SENSITIVE Sensitive     CLINDAMYCIN <=0.25 SENSITIVE Sensitive     RIFAMPIN <=0.5 SENSITIVE Sensitive     Inducible Clindamycin NEGATIVE Sensitive     * STAPHYLOCOCCUS AUREUS  Blood Culture ID Panel (Reflexed)     Status: Abnormal   Collection Time: 04/12/21  4:24 AM  Result Value Ref Range Status   Enterococcus faecalis NOT DETECTED NOT DETECTED Final   Enterococcus Faecium NOT DETECTED NOT DETECTED Final   Listeria monocytogenes NOT DETECTED NOT DETECTED Final   Staphylococcus species DETECTED (A) NOT DETECTED Final    Comment: CRITICAL RESULT CALLED TO, READ BACK BY AND VERIFIED WITH: L POINDEXTER,PHARMD'@2250'$  04/12/21 [place]    Staphylococcus aureus (BCID) DETECTED (A) NOT DETECTED Final    Comment: CRITICAL RESULT CALLED TO, READ BACK BY AND VERIFIED WITH: L POINDEXTER,PHARMD'@2250'$  04/12/21 [place]    Staphylococcus epidermidis NOT DETECTED NOT DETECTED Final   Staphylococcus lugdunensis NOT DETECTED NOT DETECTED Final   Streptococcus species NOT DETECTED NOT DETECTED Final   Streptococcus agalactiae NOT DETECTED NOT DETECTED Final   Streptococcus pneumoniae NOT DETECTED NOT DETECTED Final   Streptococcus pyogenes NOT DETECTED NOT DETECTED Final   A.calcoaceticus-baumannii NOT DETECTED NOT DETECTED Final   Bacteroides fragilis NOT DETECTED NOT DETECTED Final   Enterobacterales NOT DETECTED NOT DETECTED Final   Enterobacter cloacae complex NOT DETECTED NOT DETECTED Final   Escherichia coli NOT DETECTED NOT DETECTED Final   Klebsiella aerogenes NOT DETECTED NOT DETECTED Final   Klebsiella oxytoca NOT DETECTED NOT DETECTED Final   Klebsiella pneumoniae NOT DETECTED NOT DETECTED Final   Proteus species NOT DETECTED NOT DETECTED Final   Salmonella species NOT DETECTED NOT DETECTED Final   Serratia marcescens NOT DETECTED NOT DETECTED Final    Haemophilus influenzae NOT DETECTED NOT DETECTED Final   Neisseria meningitidis NOT DETECTED NOT DETECTED Final   Pseudomonas aeruginosa NOT DETECTED NOT DETECTED Final   Stenotrophomonas maltophilia NOT DETECTED NOT DETECTED Final   Candida albicans NOT DETECTED NOT DETECTED Final   Candida auris NOT DETECTED NOT DETECTED Final   Candida glabrata NOT DETECTED NOT DETECTED Final   Candida krusei NOT DETECTED NOT DETECTED Final   Candida parapsilosis NOT DETECTED NOT DETECTED Final   Candida tropicalis NOT DETECTED NOT DETECTED Final   Cryptococcus neoformans/gattii NOT DETECTED NOT DETECTED Final   Meth resistant mecA/C and MREJ NOT DETECTED NOT DETECTED Final    Comment: Performed at [facility], [address], [place], [place] [address]  Resp Panel by RT-PCR (Flu A&B, Covid) Nasopharyngeal Swab     Status: None   Collection Time: 04/12/21  7:05 AM   Specimen: Nasopharyngeal Swab; Nasopharyngeal(NP) swabs in vial transport medium  Result Value Ref Range Status   SARS Coronavirus 2 by RT PCR NEGATIVE NEGATIVE Final    Comment: (NOTE) SARS-CoV-2 target nucleic acids are NOT DETECTED.  The SARS-CoV-2 RNA is generally detectable in upper respiratory specimens during the acute phase of infection. The lowest concentration of SARS-CoV-2 viral copies this assay can detect is 138 copies/mL. A negative result does not preclude SARS-Cov-2 infection and should not be used as the sole basis for treatment or other patient management decisions. A negative result may occur with  improper specimen collection/handling, submission of specimen other than nasopharyngeal swab, presence of viral mutation(s) within the areas targeted by this assay, and inadequate number of viral copies(<138 copies/mL). A negative result must be combined with clinical observations, patient history, and epidemiological information. The expected result is Negative.  Fact  Sheet for Patients:   EntrepreneurPulse.com.au  Fact Sheet for Healthcare Providers:  IncredibleEmployment.be  This test is no t yet approved or cleared by the [place] FDA and  has been authorized for detection and/or diagnosis of SARS-CoV-2 by FDA under an Emergency Use Authorization (EUA). This EUA will remain  in effect (meaning this test can be used) for the duration of the COVID-19 declaration under Section 564(b)(1) of the Act, 21 U.S.C.section 360bbb-3(b)(1), unless the authorization is terminated  or revoked sooner.       Influenza A by PCR NEGATIVE NEGATIVE Final   Influenza B by PCR NEGATIVE NEGATIVE Final    Comment: (NOTE) The Xpert Xpress SARS-CoV-2/FLU/RSV plus assay is intended as an aid in the diagnosis of influenza from Nasopharyngeal swab specimens and should not be used as a sole basis for treatment. Nasal washings and aspirates are unacceptable for Xpert Xpress SARS-CoV-2/FLU/RSV testing.  Fact Sheet for Patients: EntrepreneurPulse.com.au  Fact Sheet for Healthcare Providers: IncredibleEmployment.be  This test is not yet approved or cleared by the [place] FDA and has been authorized for detection and/or diagnosis of SARS-CoV-2 by FDA under an Emergency Use Authorization (EUA). This EUA will remain in effect (meaning this test can be used) for the duration of the COVID-19 declaration under Section 564(b)(1) of the Act, 21 U.S.C. section 360bbb-3(b)(1), unless the authorization is terminated or revoked.  Performed at [facility], [place] [address] [place], [place] [address]   Respiratory (~20 pathogens) panel by PCR     Status: None   Collection Time: 04/12/21  9:59 AM   Specimen: Nasopharyngeal Swab; Respiratory  Result Value Ref Range Status   Adenovirus NOT DETECTED NOT DETECTED Final   Coronavirus 229E NOT DETECTED NOT DETECTED Final    Comment: (NOTE) The Coronavirus  on the Respiratory Panel, DOES NOT test for the novel  Coronavirus (2019 nCoV)    Coronavirus HKU1 NOT DETECTED NOT DETECTED Final   Coronavirus NL63 NOT DETECTED NOT DETECTED Final   Coronavirus OC43 NOT DETECTED NOT DETECTED Final   Metapneumovirus NOT DETECTED NOT DETECTED Final   Rhinovirus / Enterovirus NOT DETECTED NOT DETECTED Final   Influenza A NOT DETECTED NOT DETECTED Final   Influenza B NOT DETECTED NOT DETECTED Final   Parainfluenza Virus 1 NOT DETECTED NOT DETECTED Final   Parainfluenza Virus 2 NOT DETECTED NOT DETECTED Final   Parainfluenza Virus 3 NOT DETECTED NOT DETECTED Final   Parainfluenza Virus 4 NOT DETECTED NOT DETECTED Final   Respiratory Syncytial Virus NOT DETECTED NOT DETECTED Final   Bordetella pertussis NOT DETECTED NOT DETECTED Final   Bordetella Parapertussis NOT DETECTED NOT DETECTED Final   Chlamydophila pneumoniae NOT DETECTED NOT DETECTED Final   Mycoplasma pneumoniae NOT DETECTED NOT DETECTED Final    Comment: Performed at [facility], [place]. [address], [place], [place] [address]  Culture, blood (routine x 2)     Status: None (Preliminary result)   Collection Time: 04/14/21  5:37 AM   Specimen: BLOOD  Result Value Ref Range Status   Specimen Description   Final    BLOOD LEFT ANTECUBITAL Performed at  [facility] [address] [place], [place] [address]    Special Requests   Final    BOTTLES DRAWN AEROBIC ONLY Blood Culture adequate volume Performed at [facility] [address] [place], [place] [address]    Culture   Final    NO GROWTH 2 DAYS Performed at [facility],  [address], [place], [place] [address]    Report Status PENDING  Incomplete  Culture, blood (routine x 2)     Status: None (Preliminary result)   Collection Time: 04/14/21  5:38 AM   Specimen: BLOOD  Result Value Ref Range Status   Specimen Description   Final    BLOOD BLOOD LEFT FOREARM Performed at [facility] [address], [place], [place] [address]    Special Requests   Final    BOTTLES DRAWN AEROBIC AND ANAEROBIC Blood Culture adequate volume Performed at [facility] [address], [place], [place] [address]    Culture   Final    NO GROWTH 2 DAYS Performed at [facility] [address], [place], [place] [address]    Report Status PENDING  Incomplete    Studies/Results: No results found.    Assessment/Plan:  INTERVAL HISTORY:  repeat blood cultures no growth so far   Active Problems:   Bacteremia   COPD exacerbation [facility])    Michael Hines is a 75 y.o. male with  AVR sp redo in 2013 admitted with sepsis and MSSA bacteremia. He has cleared his blood cultures. TTE did not show vegetations   --he clearly need TEE and this is scheduled for Friday )which is actually a good thing since sometimes early TEE fails to pick up endocarditis  He DOES have complaint of low back pain though recent MRI does not show diskitis  EVEN if we rule out endocarditis I would at minimum give him 4 weeks of parenteral therapy  Frequent falls: wife and patient tell me that neuropathy and possible Parkinsons in part blamed though certainly superimposed bacteremia would have played a role now  I spent 37  minutes with the patient including face to face counseling of the patient and his wife re nature of MSSA bacteremia, possible endocarditis personally reviewing MRI L spine without contrast TTE, blood cultures, CMP< CBC along with review of medical records before and during the visit and in coordination of his care.    LOS: 3 days   Alcide Evener 04/16/2021, 5:30 PM

## 2021-04-16 NOTE — Progress Notes (Addendum)
[facility]  PROGRESS NOTE  Michael Hines [id number] DOB: 16-Oct-1945 DOA: 04/12/2021 PCP: Finis Bud, MD   Brief HPI:   75 year old male with medical history of tobacco abuse, COPD, diabetes mellitus type 2 presented with fever and chills with shortness of breath and altered mental status.  Per wife, patient woke up middle of night was breathing heavily, shivering.  She checked his temperature which was 100.8.  EMS was called.  In the ED he was found to be tachycardic, tachypneic.  CT abdomen/pelvis was negative, mild leukocytosis, was found to be toxemic on room air.  Blood cultures grew MSSA, started on IV cefazolin.  ID consulted   Subjective   TEE rescheduled for Friday.   Assessment/Plan:    MSSA bacteremia -Patient started on IV cefazolin. -No clear source; ID consulted -ID recommends TEE, as patient has history of aortic valve replacement in 2013 -MRI lumbar spine only showed multilevel spondylosis, no discitis or abscess. - cardiology consulted for TEE; called and discussed with cardiology.  TEE was supposed to be done today, however cardiology has  rescheduled it on Friday [id number]  Metabolic encephalopathy/delirium -In setting of MSSA bacteremia -Haldol 2 mg p.o. x1 as needed ordered -Continue to monitor  Dyspnea -Resolved; likely was tachypneic from bacteremia -We discontinued prednisone  -Continue DuoNeb nebulization every 6 hours as needed  Recent falls -PT evaluation obtained -Recommended to go to rehab  Diabetes mellitus type 2 -Continue sliding scale insulin with NovoLog -CBG well controlled   Scheduled medications:    buPROPion  300 mg Oral Daily   enoxaparin (LOVENOX) injection  40 mg Subcutaneous Q24H   haloperidol  2 mg Oral Once   Or   haloperidol lactate  2 mg Intramuscular Once   insulin aspart  0-15 Units Subcutaneous TID WC   insulin aspart  0-5 Units Subcutaneous QHS         Data Reviewed:   CBG:  Recent  Labs  Lab 04/15/21 1141 04/15/21 1640 04/15/21 2025 04/16/21 0752 04/16/21 1143  GLUCAP 95 117* 104* 102* 107*    SpO2: 96 % O2 Flow Rate (L/min): 2 L/min    Vitals:   04/15/21 1304 04/15/21 1510 04/15/21 2020 04/16/21 0506  BP: (!) 135/98  (!) 143/93 (!) 151/96  Pulse: 83  91 78  Resp: '19  18 20  '$ Temp: 98 F (36.7 C)  98.9 F (37.2 C) 98.2 F (36.8 C)  TempSrc: Oral  Oral Oral  SpO2: 94% 95% 93% 96%  Weight:      Height:         Intake/Output Summary (Last 24 hours) at 04/16/2021 1528 Last data filed at 04/16/2021 0444 Gross per 24 hour  Intake 1533.82 ml  Output 1000 ml  Net 533.82 ml    09/04 1901 - 09/06 0700 In: 1769.8 [P.O.:356; I.V.:913.8] Out: 1500 [Urine:1500]  Filed Weights   04/13/21 1527  Weight: 94.2 kg    CBC:  Recent Labs  Lab 04/12/21 0423 04/12/21 0959 04/13/21 0556 04/14/21 0537  WBC 11.4* 11.6* 10.7* 6.0  HGB 16.9 15.8 13.6 15.1  HCT 52.1* 47.6 40.3 44.7  PLT 187 162 151 122*  MCV 95.9 94.8 92.2 92.5  MCH 31.1 31.5 31.1 31.3  MCHC 32.4 33.2 33.7 33.8  RDW 15.0 15.0 14.6 15.1  LYMPHSABS 0.9  --   --   --   MONOABS 0.7  --   --   --   EOSABS 0.0  --   --   --  BASOSABS 0.1  --   --   --     Complete metabolic panel:  Recent Labs  Lab 04/12/21 0423 04/12/21 0616 04/12/21 0959 04/13/21 0556 04/14/21 0537  NA 137  --   --  138 136  K 4.3  --   --  3.6 3.6  CL 100  --   --  105 100  CO2 29  --   --  26 25  GLUCOSE 123*  --   --  195* 103*  BUN 15  --   --  19 24*  CREATININE 1.11  --  0.96 0.79 1.00  CALCIUM 8.8*  --   --  8.8* 8.7*  AST 16  --   --  18 29  ALT 18  --   --  22 28  ALKPHOS 81  --   --  48 49  BILITOT 0.7  --   --  0.6 0.7  ALBUMIN 3.8  --   --  2.9* 3.0*  LATICACIDVEN 1.5 1.0  --   --   --   INR 0.8  --   --   --   --   HGBA1C  --   --  7.9*  --   --     No results for input(s): LIPASE, AMYLASE in the last 168 hours.  Recent Labs  Lab 04/12/21 0705  SARSCOV2NAA NEGATIVE     ------------------------------------------------------------------------------------------------------------------ No results for input(s): CHOL, HDL, LDLCALC, TRIG, CHOLHDL, LDLDIRECT in the last 72 hours.  Lab Results  Component Value Date   HGBA1C 7.9 (H) 04/12/2021   ------------------------------------------------------------------------------------------------------------------ No results for input(s): TSH, T4TOTAL, T3FREE, THYROIDAB in the last 72 hours.  Invalid input(s): FREET3 ------------------------------------------------------------------------------------------------------------------ No results for input(s): VITAMINB12, FOLATE, FERRITIN, TIBC, IRON, RETICCTPCT in the last 72 hours.  Coagulation profile Recent Labs  Lab 04/12/21 0423  INR 0.8   No results for input(s): DDIMER in the last 72 hours.  Cardiac Enzymes No results for input(s): CKTOTAL, CKMB, CKMBINDEX, TROPONINI in the last 168 hours.  ------------------------------------------------------------------------------------------------------------------    Component Value Date/Time   BNP 74.6 12/04/2019 2057     Antibiotics: Anti-infectives (From admission, onward)    Start     Dose/Rate Route Frequency Ordered Stop   04/13/21 2200  ceFAZolin (ANCEF) IVPB 2g/100 mL premix        2 g 200 mL/hr over 30 Minutes Intravenous Every 8 hours 04/13/21 1452     04/13/21 1500  ceFAZolin (ANCEF) IVPB 2g/100 mL premix        2 g 200 mL/hr over 30 Minutes Intravenous STAT 04/13/21 1452 04/14/21 1146   04/12/21 1000  cefTRIAXone (ROCEPHIN) 1 g in sodium chloride 0.9 % 100 mL IVPB  Status:  Discontinued        1 g 200 mL/hr over 30 Minutes Intravenous Every 24 hours 04/12/21 0958 04/13/21 1452   04/12/21 0445  ceFEPIme (MAXIPIME) 2 g in sodium chloride 0.9 % 100 mL IVPB        2 g 200 mL/hr over 30 Minutes Intravenous  Once 04/12/21 0436 04/12/21 0520        Radiology Reports  No results  found.    DVT prophylaxis: Lovenox  Code Status: Full code  Family Communication: Wife at bedside   Consultants: Infectious disease  Procedures:     Objective    Physical Examination:  General-appears in no acute distress Heart-S1-S2, regular, no murmur auscultated Lungs-clear to auscultation bilaterally, no wheezing or crackles auscultated Abdomen-soft, nontender,  no organomegaly Extremities-no edema in the lower extremities Neuro-alert, oriented x 2.  No focal deficit noted has made life easier  Status is: Inpatient  Dispo: The patient is from: Home              Anticipated d/c is to: Skilled nursing facility              Anticipated d/c date is: 04/17/2021              Patient currently not stable for discharge  Barrier to discharge-work-up for MSSA bacteremia  COVID-19 Labs  No results for input(s): DDIMER, FERRITIN, LDH, CRP in the last 72 hours.  Lab Results  Component Value Date   [place] NEGATIVE 04/12/2021    Microbiology  Recent Results (from the past 240 hour(s))  Blood culture (routine x 2)     Status: Abnormal   Collection Time: 04/12/21  4:23 AM   Specimen: BLOOD  Result Value Ref Range Status   Specimen Description   Final    BLOOD BLOOD LEFT HAND Performed at [facility], [place] [address], [place], [place] [address]    Special Requests   Final    BOTTLES DRAWN AEROBIC AND ANAEROBIC Blood Culture adequate volume Performed at [place] [address] [place], [place] [address]    Culture  Setup Time   Final    GRAM POSITIVE COCCI IN CLUSTERS IN BOTH AEROBIC AND ANAEROBIC BOTTLES CRITICAL VALUE NOTED.  VALUE IS CONSISTENT WITH PREVIOUSLY REPORTED AND CALLED VALUE.    Culture (A)  Final    STAPHYLOCOCCUS AUREUS SUSCEPTIBILITIES PERFORMED ON PREVIOUS CULTURE WITHIN THE LAST 5 DAYS. Performed at [facility], [place] [address], [place], [place] [address]    Report Status 04/15/2021 FINAL   Final  Blood culture (routine x 2)     Status: Abnormal   Collection Time: 04/12/21  4:24 AM   Specimen: Right Antecubital; Blood  Result Value Ref Range Status   Specimen Description   Final    RIGHT ANTECUBITAL Performed at [facility] [address], [place], [place] [address]    Special Requests   Final    BOTTLES DRAWN AEROBIC AND ANAEROBIC Blood Culture results may not be optimal due to an inadequate volume of blood received in culture bottles Performed at [place] [facility] [address], [place], [place] [address]    Culture  Setup Time   Final    GRAM POSITIVE COCCI IN CLUSTERS IN BOTH AEROBIC AND ANAEROBIC BOTTLES CRITICAL RESULT CALLED TO, READ BACK BY AND VERIFIED WITH: L POINDEXTER,PHARMD'@2250'$  04/12/21 [place] Performed at [facility], [place] [address], [place],  [address]    Culture STAPHYLOCOCCUS AUREUS (A)  Final   Report Status 04/15/2021 FINAL  Final   Organism ID, Bacteria STAPHYLOCOCCUS AUREUS  Final      Susceptibility   Staphylococcus aureus - MIC*    CIPROFLOXACIN <=0.5 SENSITIVE Sensitive     ERYTHROMYCIN <=0.25 SENSITIVE Sensitive     GENTAMICIN <=0.5 SENSITIVE Sensitive     OXACILLIN <=0.25 SENSITIVE Sensitive     TETRACYCLINE <=1 SENSITIVE Sensitive     VANCOMYCIN 1 SENSITIVE Sensitive     TRIMETH/SULFA <=10 SENSITIVE Sensitive     CLINDAMYCIN <=0.25 SENSITIVE Sensitive     RIFAMPIN <=0.5 SENSITIVE Sensitive     Inducible Clindamycin NEGATIVE Sensitive     * STAPHYLOCOCCUS AUREUS  Blood Culture ID Panel (Reflexed)     Status: Abnormal  Collection Time: 04/12/21  4:24 AM  Result Value Ref Range Status   Enterococcus faecalis NOT DETECTED NOT DETECTED Final   Enterococcus Faecium NOT DETECTED NOT DETECTED Final   Listeria monocytogenes NOT DETECTED NOT DETECTED Final   Staphylococcus species DETECTED (A) NOT DETECTED Final    Comment: CRITICAL RESULT CALLED TO, READ BACK BY AND VERIFIED WITH: L  POINDEXTER,PHARMD'@2250'$  04/12/21 [place]    Staphylococcus aureus (BCID) DETECTED (A) NOT DETECTED Final    Comment: CRITICAL RESULT CALLED TO, READ BACK BY AND VERIFIED WITH: L POINDEXTER,PHARMD'@2250'$  04/12/21 [place]    Staphylococcus epidermidis NOT DETECTED NOT DETECTED Final   Staphylococcus lugdunensis NOT DETECTED NOT DETECTED Final   Streptococcus species NOT DETECTED NOT DETECTED Final   Streptococcus agalactiae NOT DETECTED NOT DETECTED Final   Streptococcus pneumoniae NOT DETECTED NOT DETECTED Final   Streptococcus pyogenes NOT DETECTED NOT DETECTED Final   A.calcoaceticus-baumannii NOT DETECTED NOT DETECTED Final   Bacteroides fragilis NOT DETECTED NOT DETECTED Final   Enterobacterales NOT DETECTED NOT DETECTED Final   Enterobacter cloacae complex NOT DETECTED NOT DETECTED Final   Escherichia coli NOT DETECTED NOT DETECTED Final   Klebsiella aerogenes NOT DETECTED NOT DETECTED Final   Klebsiella oxytoca NOT DETECTED NOT DETECTED Final   Klebsiella pneumoniae NOT DETECTED NOT DETECTED Final   Proteus species NOT DETECTED NOT DETECTED Final   Salmonella species NOT DETECTED NOT DETECTED Final   Serratia marcescens NOT DETECTED NOT DETECTED Final   Haemophilus influenzae NOT DETECTED NOT DETECTED Final   Neisseria meningitidis NOT DETECTED NOT DETECTED Final   Pseudomonas aeruginosa NOT DETECTED NOT DETECTED Final   Stenotrophomonas maltophilia NOT DETECTED NOT DETECTED Final   Candida albicans NOT DETECTED NOT DETECTED Final   Candida auris NOT DETECTED NOT DETECTED Final   Candida glabrata NOT DETECTED NOT DETECTED Final   Candida krusei NOT DETECTED NOT DETECTED Final   Candida parapsilosis NOT DETECTED NOT DETECTED Final   Candida tropicalis NOT DETECTED NOT DETECTED Final   Cryptococcus neoformans/gattii NOT DETECTED NOT DETECTED Final   Meth resistant mecA/C and MREJ NOT DETECTED NOT DETECTED Final    Comment: Performed at [facility], [address] [place], [place]  [address]  Resp Panel by RT-PCR (Flu A&B, Covid) Nasopharyngeal Swab     Status: None   Collection Time: 04/12/21  7:05 AM   Specimen: Nasopharyngeal Swab; Nasopharyngeal(NP) swabs in vial transport medium  Result Value Ref Range Status   SARS Coronavirus 2 by RT PCR NEGATIVE NEGATIVE Final    Comment: (NOTE) SARS-CoV-2 target nucleic acids are NOT DETECTED.  The SARS-CoV-2 RNA is generally detectable in upper respiratory specimens during the acute phase of infection. The lowest concentration of SARS-CoV-2 viral copies this assay can detect is 138 copies/mL. A negative result does not preclude SARS-Cov-2 infection and should not be used as the sole basis for treatment or other patient management decisions. A negative result may occur with  improper specimen collection/handling, submission of specimen other than nasopharyngeal swab, presence of viral mutation(s) within the areas targeted by this assay, and inadequate number of viral copies(<138 copies/mL). A negative result must be combined with clinical observations, patient history, and epidemiological information. The expected result is Negative.  Fact Sheet for Patients:  EntrepreneurPulse.com.au  Fact Sheet for Healthcare Providers:  IncredibleEmployment.be  This test is no t yet approved or cleared by the [place] FDA and  has been authorized for detection and/or diagnosis of SARS-CoV-2 by FDA under an Emergency Use Authorization (EUA).  This EUA will remain  in effect (meaning this test can be used) for the duration of the COVID-19 declaration under Section 564(b)(1) of the Act, 21 U.S.C.section 360bbb-3(b)(1), unless the authorization is terminated  or revoked sooner.       Influenza A by PCR NEGATIVE NEGATIVE Final   Influenza B by PCR NEGATIVE NEGATIVE Final    Comment: (NOTE) The Xpert Xpress SARS-CoV-2/FLU/RSV plus assay is intended as an aid in the diagnosis of influenza from  Nasopharyngeal swab specimens and should not be used as a sole basis for treatment. Nasal washings and aspirates are unacceptable for Xpert Xpress SARS-CoV-2/FLU/RSV testing.  Fact Sheet for Patients: EntrepreneurPulse.com.au  Fact Sheet for Healthcare Providers: IncredibleEmployment.be  This test is not yet approved or cleared by the [place] FDA and has been authorized for detection and/or diagnosis of SARS-CoV-2 by FDA under an Emergency Use Authorization (EUA). This EUA will remain in effect (meaning this test can be used) for the duration of the COVID-19 declaration under Section 564(b)(1) of the Act, 21 U.S.C. section 360bbb-3(b)(1), unless the authorization is terminated or revoked.  Performed at [facility], [place] [address] [place], [place] [address]   Respiratory (~20 pathogens) panel by PCR     Status: None   Collection Time: 04/12/21  9:59 AM   Specimen: Nasopharyngeal Swab; Respiratory  Result Value Ref Range Status   Adenovirus NOT DETECTED NOT DETECTED Final   Coronavirus 229E NOT DETECTED NOT DETECTED Final    Comment: (NOTE) The Coronavirus on the Respiratory Panel, DOES NOT test for the novel  Coronavirus (2019 nCoV)    Coronavirus HKU1 NOT DETECTED NOT DETECTED Final   Coronavirus NL63 NOT DETECTED NOT DETECTED Final   Coronavirus OC43 NOT DETECTED NOT DETECTED Final   Metapneumovirus NOT DETECTED NOT DETECTED Final   Rhinovirus / Enterovirus NOT DETECTED NOT DETECTED Final   Influenza A NOT DETECTED NOT DETECTED Final   Influenza B NOT DETECTED NOT DETECTED Final   Parainfluenza Virus 1 NOT DETECTED NOT DETECTED Final   Parainfluenza Virus 2 NOT DETECTED NOT DETECTED Final   Parainfluenza Virus 3 NOT DETECTED NOT DETECTED Final   Parainfluenza Virus 4 NOT DETECTED NOT DETECTED Final   Respiratory Syncytial Virus NOT DETECTED NOT DETECTED Final   Bordetella pertussis NOT DETECTED NOT DETECTED  Final   Bordetella Parapertussis NOT DETECTED NOT DETECTED Final   Chlamydophila pneumoniae NOT DETECTED NOT DETECTED Final   Mycoplasma pneumoniae NOT DETECTED NOT DETECTED Final    Comment: Performed at [facility], [place]. [address], [place], [place] [address]  Culture, blood (routine x 2)     Status: None (Preliminary result)   Collection Time: 04/14/21  5:37 AM   Specimen: BLOOD  Result Value Ref Range Status   Specimen Description   Final    BLOOD LEFT ANTECUBITAL Performed at [place] [address], [place], [place] [address]    Special Requests   Final    BOTTLES DRAWN AEROBIC ONLY Blood Culture adequate volume Performed at [place] [address], [place], [place] [address]    Culture   Final    NO GROWTH 2 DAYS Performed at [place] [address], [place], [place] [address]    Report Status PENDING  Incomplete  Culture, blood (routine x 2)     Status: None (Preliminary result)   Collection Time: 04/14/21  5:38 AM   Specimen: BLOOD  Result Value Ref Range Status  Specimen Description   Final    BLOOD BLOOD LEFT FOREARM Performed at [place] [address], [place], [place] [address]    Special Requests   Final    BOTTLES DRAWN AEROBIC AND ANAEROBIC Blood Culture adequate volume Performed at [place] [address], [place], [place] [address]    Culture   Final    NO GROWTH 2 DAYS Performed at [facility] [address] [place], [place] [address]    Report Status PENDING  Incomplete       Oswald Hillock   [facility] If 7PM-7AM, please contact night-coverage at [email or web address], Office  [phone]   04/16/2021, 3:28 PM  LOS: 3 days

## 2021-04-17 DIAGNOSIS — A419 Sepsis, unspecified organism: Secondary | ICD-10-CM

## 2021-04-17 DIAGNOSIS — M545 Low back pain, unspecified: Secondary | ICD-10-CM | POA: Diagnosis not present

## 2021-04-17 DIAGNOSIS — R4182 Altered mental status, unspecified: Secondary | ICD-10-CM | POA: Diagnosis not present

## 2021-04-17 DIAGNOSIS — R7881 Bacteremia: Secondary | ICD-10-CM | POA: Diagnosis not present

## 2021-04-17 LAB — BASIC METABOLIC PANEL
Anion gap: 8 (ref 5–15)
BUN: 16 mg/dL (ref 8–23)
CO2: 25 mmol/L (ref 22–32)
Calcium: 8.6 mg/dL — ABNORMAL LOW (ref 8.9–10.3)
Chloride: 106 mmol/L (ref 98–111)
Creatinine, Ser: 0.68 mg/dL (ref 0.61–1.24)
GFR, Estimated: >60 mL/min (ref >60)
Glucose, Bld: 98 mg/dL (ref 70–99)
Potassium: 3.4 mmol/L — ABNORMAL LOW (ref 3.5–5.1)
Sodium: 139 mmol/L (ref 135–145)

## 2021-04-17 LAB — GLUCOSE, CAPILLARY
Glucose-Capillary: 108 mg/dL — ABNORMAL HIGH (ref 70–99)
Glucose-Capillary: 110 mg/dL — ABNORMAL HIGH (ref 70–99)
Glucose-Capillary: 160 mg/dL — ABNORMAL HIGH (ref 70–99)
Glucose-Capillary: 215 mg/dL — ABNORMAL HIGH (ref 70–99)

## 2021-04-17 LAB — CBC
HCT: 43.7 % (ref 39.0–52.0)
Hemoglobin: 14.9 g/dL (ref 13.0–17.0)
MCH: 31.2 pg (ref 26.0–34.0)
MCHC: 34.1 g/dL (ref 30.0–36.0)
MCV: 91.6 fL (ref 80.0–100.0)
Platelets: 141 10*3/uL — ABNORMAL LOW (ref 150–400)
RBC: 4.77 MIL/uL (ref 4.22–5.81)
RDW: 14.6 % (ref 11.5–15.5)
WBC: 8.2 10*3/uL (ref 4.0–10.5)
nRBC: 0.2 % (ref 0.0–0.2)

## 2021-04-17 MED ORDER — ALUM & MAG HYDROXIDE-SIMETH 200-200-20 MG/5ML PO SUSP
15.0000 mL | Freq: Four times a day (QID) | ORAL | Status: DC | PRN
  Filled 2021-04-17: qty 30

## 2021-04-17 MED ORDER — POTASSIUM CHLORIDE CRYS ER 20 MEQ PO TBCR
40.0000 meq | EXTENDED_RELEASE_TABLET | Freq: Once | ORAL | Status: AC
  Administered 2021-04-17: 40 meq via ORAL
  Filled 2021-04-17: qty 4

## 2021-04-17 NOTE — Progress Notes (Signed)
Pt scheduled for 1330 TEE at [facility].  [facility] to pick up at 1200.  Bedside RN aware.   Vista Lawman, RN

## 2021-04-17 NOTE — Progress Notes (Signed)
Occupational Therapy Progress Note  Patient A/Ox4 however joking a lot and does get distracted at times needing cues to redirect to mobility tasks. Also becomes tearful about prognosis/being in the hospital and starts crying while seated at edge of bed needing encouragement. Patient needing mod A for bed mobility to upright trunk. Also needing mod A with cues to keep hands on walker while pivoting to bedside commode. Patient incontinent of stool in bed, needed total A in standing for perianal care reliant on bilateral upper extremity support. Continue to recommend rehab at D/C as patient is high fall risk and poor insight for hygiene (did not notify nursing to get cleaned up after bowel movement in bed.)     04/17/21 1200  OT Visit Information  Last OT Received On 04/17/21  Assistance Needed +1 (+2 to progress/safety)  History of Present Illness 75 y.o. male with medical history significant of tobacco abuse, COPD, DM2, anxiety. Presenting with dyspnea and confusion.  He was found to have a blood infection.  Precautions  Precautions Fall  Precaution Comments multiple falls at home  Pain Assessment  Pain Assessment Faces  Faces Pain Scale 4  Pain Location back  Pain Descriptors / Indicators Grimacing;Guarding  Pain Intervention(s) Premedicated before session  Cognition  Arousal/Alertness Awake/alert  Behavior During Therapy WFL for tasks assessed/performed  Overall Cognitive Status Within Functional Limits for tasks assessed  General Comments patient is alert and oriented x4, does like to joke a lot needing cues to redirect to tasks at times.  depressed affect, does become emotional/crying at times during session feeling discouraged  ADL  Overall ADL's  Needs assistance/impaired  Toilet Transfer Moderate assistance;Stand-pivot;Cueing for safety;Cueing for sequencing;BSC;RW  Toilet Transfer Details (indicate cue type and reason) poor safety awareness, initially letting go of walker  prematurely before turned to bedside commode. poor eccentric control with sitting onto commode. needs cues to reach back. difficulty pivoting feet  Toileting- Clothing Manipulation and Hygiene Total assistance;Sit to/from stand  Toileting - Clothing Manipulation Details (indicate cue type and reason) reliant on B UE support, incontinent of stool in bed but had not notified nursing staff  Functional mobility during ADLs Moderate assistance;Rolling walker;Cueing for safety;Cueing for sequencing  Bed Mobility  Overal bed mobility Needs Assistance  Bed Mobility Rolling;Sidelying to Sit  Rolling Min guard  Sidelying to sit Mod assist;HOB elevated  General bed mobility comments cues to push through elbow to assist with uprighting trunk to sitting. mod A to lift trunk. gets distracted/joking needing cues to follow through with bed mobility  Balance  Overall balance assessment Needs assistance;History of Falls  Sitting-balance support Feet supported  Sitting balance-Leahy Scale Fair  Standing balance support Bilateral upper extremity supported  Standing balance-Leahy Scale Poor  Standing balance comment reliant on B UE support and min to mod A  Transfers  Overall transfer level Needs assistance  Equipment used Rolling walker (2 wheeled)  Transfers Sit to/from [employer] Transfers  Sit to Stand Mod assist  Stand pivot transfers Mod assist  General transfer comment poor safety awareness, please see toilet transfer in ADL section  OT - End of Session  Equipment Utilized During Treatment Gait belt;Rolling walker  Activity Tolerance Patient tolerated treatment well  Patient left in chair;with call bell/phone within reach;with chair alarm set  Nurse Communication Mobility status  OT Assessment/Plan  OT Plan Discharge plan remains appropriate  OT Visit Diagnosis Unsteadiness on feet (R26.81);Muscle weakness (generalized) (M62.81);History of falling (Z91.81)  OT Frequency (ACUTE ONLY) Min  2X/week  Follow Up Recommendations SNF  OT Equipment Other (comment) (defer next venue)  AM-PAC OT "6 Clicks" Daily Activity Outcome Measure (Version 2)  Help from another person eating meals? 3  Help from another person taking care of personal grooming? 3  Help from another person toileting, which includes using toliet, bedpan, or urinal? 2  Help from another person bathing (including washing, rinsing, drying)? 2  Help from another person to put on and taking off regular upper body clothing? 3  Help from another person to put on and taking off regular lower body clothing? 1  6 Click Score 14  Progressive Mobility  What is the highest level of mobility based on the progressive mobility assessment? Level 4 (Walks with assist in room) - Balance while marching in place and cannot step forward and back - Complete  Mobility Out of bed to chair with meals;Out of bed for toileting  OT Goal Progression  Progress towards OT goals Progressing toward goals  Acute Rehab OT Goals  Patient Stated Goal get stronger  ADL Goals  Pt Will Perform Lower Body Dressing with modified independence;with adaptive equipment;sit to/from stand  Pt Will Transfer to Toilet with modified independence;ambulating  Pt Will Perform Toileting - Clothing Manipulation and hygiene with modified independence;sit to/from stand  Additional ADL Goal #1 patient to participate in sitting edge of bed with SUP to participate in ADLs to increase independence in ADLs  OT Time Calculation  OT Start Time (ACUTE ONLY) 1016  OT Stop Time (ACUTE ONLY) 1055  OT Time Calculation (min) 39 min  OT General Charges  $OT Visit 1 Visit  OT Treatments  $Self Care/Home Management  38-52 mins   Delbert Phenix OT OT pager: [phone]

## 2021-04-17 NOTE — Progress Notes (Signed)
Progress Note    Michael Hines  [id number] DOB: March 31, 1946  DOA: 04/12/2021 PCP: Finis Bud, MD    Brief Narrative:     Medical records reviewed and are as summarized below:  Michael Hines is an 75 y.o. male with medical history significant of tobacco abuse, COPD, DM2, anxiety. Presenting with dyspnea and confusion.  Blood cultures grew MSSA, started on IV cefazolin.  ID consulted  Assessment/Plan:   Active Problems:   MSSA bacteremia   COPD exacerbation ([place])   Acute respiratory failure with hypoxia (HCC)   S/P AVR   Parkinson disease (HCC)   Diabetic polyneuropathy associated with type 2 diabetes mellitus (HCC)   Chronic bilateral low back pain without sciatica   MSSA bacteremia -Patient started on IV cefazolin. -No clear source; ID consulted -ID recommends TEE, as patient has history of aortic valve replacement in 2013 -MRI lumbar spine only showed multilevel spondylosis, no discitis or abscess. - cardiology consulted for TEE; scheduled for Friday [id number]   Metabolic encephalopathy/delirium -In setting of MSSA bacteremia -Haldol 2 mg p.o. x1 as needed ordered -Continue to monitor   Dyspnea -Resolved; likely was tachypneic from bacteremia - discontinued prednisone  -Continue DuoNeb nebulization every 6 hours as needed   Recent falls -PT evaluation obtained -Recommended to go to rehab   Diabetes mellitus type 2 -Continue sliding scale insulin with NovoLog -CBG well controlled  obesity Body mass index is 32.51 kg/m.   Family Communication/Anticipated D/C date and plan/Code Status   DVT prophylaxis: Lovenox ordered. Code Status: Full Code.  Disposition Plan: Status is: Inpatient  Remains inpatient appropriate because:Inpatient level of care appropriate due to severity of illness  Dispo: The patient is from: Home              Anticipated d/c is to: SNF              Patient currently is not medically stable to d/c.   Difficult to  place patient No         Medical Consultants:   ID cards    Subjective:   No SOB, no CP  Objective:    Vitals:   04/16/21 0506 04/16/21 2010 04/17/21 0504 04/17/21 1246  BP: (!) 151/96 133/81 140/83 136/81  Pulse: 78 77 74 70  Resp: '20 18  20  '$ Temp: 98.2 F (36.8 C) 97.7 F (36.5 C) 97.7 F (36.5 C)   TempSrc: Oral Oral Axillary   SpO2: 96% 94% 96% 92%  Weight:      Height:        Intake/Output Summary (Last 24 hours) at 04/17/2021 1258 Last data filed at 04/17/2021 0324 Gross per 24 hour  Intake 240 ml  Output 1350 ml  Net -1110 ml   Filed Weights   04/13/21 1527  Weight: 94.2 kg    Exam:  General: Appearance:    Obese male in no acute distress     Lungs:     Clear to auscultation bilaterally, respirations unlabored  Heart:    Normal heart rate.   MS:   All extremities are intact.          Data Reviewed:   I have personally reviewed following labs and imaging studies:  Labs: Labs show the following:   Basic Metabolic Panel: Recent Labs  Lab 04/12/21 0423 04/12/21 0959 04/13/21 0556 04/14/21 0537 04/17/21 0501  NA 137  --  138 136 139  K 4.3  --  3.6 3.6 3.4*  CL 100  --  105 100 106  CO2 29  --  '26 25 25  '$ GLUCOSE 123*  --  195* 103* 98  BUN 15  --  19 24* 16  CREATININE 1.11 0.96 0.79 1.00 0.68  CALCIUM 8.8*  --  8.8* 8.7* 8.6*   GFR Estimated Creatinine Clearance: 89.9 mL/min (by C-G formula based on SCr of 0.68 mg/dL). Liver Function Tests: Recent Labs  Lab 04/12/21 0423 04/13/21 0556 04/14/21 0537  AST '16 18 29  '$ ALT '18 22 28  '$ ALKPHOS 81 48 49  BILITOT 0.7 0.6 0.7  PROT 7.5 5.8* 6.3*  ALBUMIN 3.8 2.9* 3.0*   No results for input(s): LIPASE, AMYLASE in the last 168 hours. No results for input(s): AMMONIA in the last 168 hours. Coagulation profile Recent Labs  Lab 04/12/21 0423  INR 0.8    CBC: Recent Labs  Lab 04/12/21 0423 04/12/21 0959 04/13/21 0556 04/14/21 0537 04/17/21 0501  WBC 11.4* 11.6* 10.7*  6.0 8.2  NEUTROABS 9.5*  --   --   --   --   HGB 16.9 15.8 13.6 15.1 14.9  HCT 52.1* 47.6 40.3 44.7 43.7  MCV 95.9 94.8 92.2 92.5 91.6  PLT 187 162 151 122* 141*   Cardiac Enzymes: No results for input(s): CKTOTAL, CKMB, CKMBINDEX, TROPONINI in the last 168 hours. BNP (last 3 results) No results for input(s): PROBNP in the last 8760 hours. CBG: Recent Labs  Lab 04/16/21 1143 04/16/21 1723 04/16/21 2012 04/17/21 0735 04/17/21 1211  GLUCAP 107* 104* 98 108* 110*   D-Dimer: No results for input(s): DDIMER in the last 72 hours. Hgb A1c: No results for input(s): HGBA1C in the last 72 hours. Lipid Profile: No results for input(s): CHOL, HDL, LDLCALC, TRIG, CHOLHDL, LDLDIRECT in the last 72 hours. Thyroid function studies: No results for input(s): TSH, T4TOTAL, T3FREE, THYROIDAB in the last 72 hours.  Invalid input(s): FREET3 Anemia work up: No results for input(s): VITAMINB12, FOLATE, FERRITIN, TIBC, IRON, RETICCTPCT in the last 72 hours. Sepsis Labs: Recent Labs  Lab 04/12/21 0423 04/12/21 0616 04/12/21 0959 04/13/21 0556 04/14/21 0537 04/17/21 0501  WBC 11.4*  --  11.6* 10.7* 6.0 8.2  LATICACIDVEN 1.5 1.0  --   --   --   --     Microbiology Recent Results (from the past 240 hour(s))  Blood culture (routine x 2)     Status: Abnormal   Collection Time: 04/12/21  4:23 AM   Specimen: BLOOD  Result Value Ref Range Status   Specimen Description   Final    BLOOD BLOOD LEFT HAND Performed at [facility], [place] [address], [place], [place] [address]    Special Requests   Final    BOTTLES DRAWN AEROBIC AND ANAEROBIC Blood Culture adequate volume Performed at [facility] [address] [place], [place] [address]    Culture  Setup Time   Final    GRAM POSITIVE COCCI IN CLUSTERS IN BOTH AEROBIC AND ANAEROBIC BOTTLES CRITICAL VALUE NOTED.  VALUE IS CONSISTENT WITH PREVIOUSLY REPORTED AND CALLED VALUE.    Culture (A)  Final     STAPHYLOCOCCUS AUREUS SUSCEPTIBILITIES PERFORMED ON PREVIOUS CULTURE WITHIN THE LAST 5 DAYS. Performed at [facility], [place] [address], [place], [place] [address]    Report Status 04/15/2021 FINAL  Final  Blood culture (routine x 2)     Status: Abnormal   Collection Time: 04/12/21  4:24 AM   Specimen: Right Antecubital; Blood  Result Value Ref Range Status   Specimen Description   Final    RIGHT ANTECUBITAL Performed at [facility] [address] [place], [place] [address]    Special Requests   Final    BOTTLES DRAWN AEROBIC AND ANAEROBIC Blood Culture results may not be optimal due to an inadequate volume of blood received in culture bottles Performed at [facility] [address] [place], [place] [address]    Culture  Setup Time   Final    GRAM POSITIVE COCCI IN CLUSTERS IN BOTH AEROBIC AND ANAEROBIC BOTTLES CRITICAL RESULT CALLED TO, READ BACK BY AND VERIFIED WITH: L POINDEXTER,PHARMD'@2250'$  04/12/21 [place] Performed at [facility], [place] [address], [place], [place] [address]    Culture STAPHYLOCOCCUS AUREUS (A)  Final   Report Status 04/15/2021 FINAL  Final   Organism ID, Bacteria STAPHYLOCOCCUS AUREUS  Final      Susceptibility   Staphylococcus aureus - MIC*    CIPROFLOXACIN <=0.5 SENSITIVE Sensitive     ERYTHROMYCIN <=0.25 SENSITIVE Sensitive     GENTAMICIN <=0.5 SENSITIVE Sensitive     OXACILLIN <=0.25 SENSITIVE Sensitive     TETRACYCLINE <=1 SENSITIVE Sensitive     VANCOMYCIN 1 SENSITIVE Sensitive     TRIMETH/SULFA <=10 SENSITIVE Sensitive     CLINDAMYCIN <=0.25 SENSITIVE Sensitive     RIFAMPIN <=0.5 SENSITIVE Sensitive     Inducible Clindamycin NEGATIVE Sensitive     * STAPHYLOCOCCUS AUREUS  Blood Culture ID Panel (Reflexed)     Status: Abnormal   Collection Time: 04/12/21  4:24 AM  Result Value Ref Range Status   Enterococcus faecalis NOT DETECTED NOT DETECTED Final   Enterococcus Faecium NOT DETECTED NOT DETECTED  Final   Listeria monocytogenes NOT DETECTED NOT DETECTED Final   Staphylococcus species DETECTED (A) NOT DETECTED Final    Comment: CRITICAL RESULT CALLED TO, READ BACK BY AND VERIFIED WITH: L POINDEXTER,PHARMD'@2250'$  04/12/21 [place]    Staphylococcus aureus (BCID) DETECTED (A) NOT DETECTED Final    Comment: CRITICAL RESULT CALLED TO, READ BACK BY AND VERIFIED WITH: L POINDEXTER,PHARMD'@2250'$  04/12/21 [place]    Staphylococcus epidermidis NOT DETECTED NOT DETECTED Final   Staphylococcus lugdunensis NOT DETECTED NOT DETECTED Final   Streptococcus species NOT DETECTED NOT DETECTED Final   Streptococcus agalactiae NOT DETECTED NOT DETECTED Final   Streptococcus pneumoniae NOT DETECTED NOT DETECTED Final   Streptococcus pyogenes NOT DETECTED NOT DETECTED Final   A.calcoaceticus-baumannii NOT DETECTED NOT DETECTED Final   Bacteroides fragilis NOT DETECTED NOT DETECTED Final   Enterobacterales NOT DETECTED NOT DETECTED Final   Enterobacter cloacae complex NOT DETECTED NOT DETECTED Final   Escherichia coli NOT DETECTED NOT DETECTED Final   Klebsiella aerogenes NOT DETECTED NOT DETECTED Final   Klebsiella oxytoca NOT DETECTED NOT DETECTED Final   Klebsiella pneumoniae NOT DETECTED NOT DETECTED Final   Proteus species NOT DETECTED NOT DETECTED Final   Salmonella species NOT DETECTED NOT DETECTED Final   Serratia marcescens NOT DETECTED NOT DETECTED Final   Haemophilus influenzae NOT DETECTED NOT DETECTED Final   Neisseria meningitidis NOT DETECTED NOT DETECTED Final   Pseudomonas aeruginosa NOT DETECTED NOT DETECTED Final   Stenotrophomonas maltophilia NOT DETECTED NOT DETECTED Final   Candida albicans NOT DETECTED NOT DETECTED Final   Candida auris NOT DETECTED NOT DETECTED Final   Candida glabrata NOT DETECTED NOT DETECTED Final   Candida krusei NOT DETECTED NOT DETECTED Final   Candida parapsilosis NOT DETECTED NOT DETECTED Final   Candida tropicalis  NOT DETECTED NOT DETECTED Final   Cryptococcus  neoformans/gattii NOT DETECTED NOT DETECTED Final   Meth resistant mecA/C and MREJ NOT DETECTED NOT DETECTED Final    Comment: Performed at [facility], [place] [address] [place], [place] [address]  Resp Panel by RT-PCR (Flu A&B, Covid) Nasopharyngeal Swab     Status: None   Collection Time: 04/12/21  7:05 AM   Specimen: Nasopharyngeal Swab; Nasopharyngeal(NP) swabs in vial transport medium  Result Value Ref Range Status   SARS Coronavirus 2 by RT PCR NEGATIVE NEGATIVE Final    Comment: (NOTE) SARS-CoV-2 target nucleic acids are NOT DETECTED.  The SARS-CoV-2 RNA is generally detectable in upper respiratory specimens during the acute phase of infection. The lowest concentration of SARS-CoV-2 viral copies this assay can detect is 138 copies/mL. A negative result does not preclude SARS-Cov-2 infection and should not be used as the sole basis for treatment or other patient management decisions. A negative result may occur with  improper specimen collection/handling, submission of specimen other than nasopharyngeal swab, presence of viral mutation(s) within the areas targeted by this assay, and inadequate number of viral copies(<138 copies/mL). A negative result must be combined with clinical observations, patient history, and epidemiological information. The expected result is Negative.  Fact Sheet for Patients:  EntrepreneurPulse.com.au  Fact Sheet for Healthcare Providers:  IncredibleEmployment.be  This test is no t yet approved or cleared by the [place] FDA and  has been authorized for detection and/or diagnosis of SARS-CoV-2 by FDA under an Emergency Use Authorization (EUA). This EUA will remain  in effect (meaning this test can be used) for the duration of the COVID-19 declaration under Section 564(b)(1) of the Act, 21 U.S.C.section 360bbb-3(b)(1), unless the authorization is terminated  or revoked sooner.       Influenza A by PCR  NEGATIVE NEGATIVE Final   Influenza B by PCR NEGATIVE NEGATIVE Final    Comment: (NOTE) The Xpert Xpress SARS-CoV-2/FLU/RSV plus assay is intended as an aid in the diagnosis of influenza from Nasopharyngeal swab specimens and should not be used as a sole basis for treatment. Nasal washings and aspirates are unacceptable for Xpert Xpress SARS-CoV-2/FLU/RSV testing.  Fact Sheet for Patients: EntrepreneurPulse.com.au  Fact Sheet for Healthcare Providers: IncredibleEmployment.be  This test is not yet approved or cleared by the [place] FDA and has been authorized for detection and/or diagnosis of SARS-CoV-2 by FDA under an Emergency Use Authorization (EUA). This EUA will remain in effect (meaning this test can be used) for the duration of the COVID-19 declaration under Section 564(b)(1) of the Act, 21 U.S.C. section 360bbb-3(b)(1), unless the authorization is terminated or revoked.  Performed at [facility], [place] [address], [place],  [address]   Respiratory (~20 pathogens) panel by PCR     Status: None   Collection Time: 04/12/21  9:59 AM   Specimen: Nasopharyngeal Swab; Respiratory  Result Value Ref Range Status   Adenovirus NOT DETECTED NOT DETECTED Final   Coronavirus 229E NOT DETECTED NOT DETECTED Final    Comment: (NOTE) The Coronavirus on the Respiratory Panel, DOES NOT test for the novel  Coronavirus (2019 nCoV)    Coronavirus HKU1 NOT DETECTED NOT DETECTED Final   Coronavirus NL63 NOT DETECTED NOT DETECTED Final   Coronavirus OC43 NOT DETECTED NOT DETECTED Final   Metapneumovirus NOT DETECTED NOT DETECTED Final   Rhinovirus / Enterovirus NOT DETECTED NOT DETECTED Final   Influenza A NOT DETECTED NOT DETECTED Final   Influenza B  NOT DETECTED NOT DETECTED Final   Parainfluenza Virus 1 NOT DETECTED NOT DETECTED Final   Parainfluenza Virus 2 NOT DETECTED NOT DETECTED Final   Parainfluenza Virus 3 NOT  DETECTED NOT DETECTED Final   Parainfluenza Virus 4 NOT DETECTED NOT DETECTED Final   Respiratory Syncytial Virus NOT DETECTED NOT DETECTED Final   Bordetella pertussis NOT DETECTED NOT DETECTED Final   Bordetella Parapertussis NOT DETECTED NOT DETECTED Final   Chlamydophila pneumoniae NOT DETECTED NOT DETECTED Final   Mycoplasma pneumoniae NOT DETECTED NOT DETECTED Final    Comment: Performed at [facility], [place] [address] [place], [place] [address]  Culture, blood (routine x 2)     Status: None (Preliminary result)   Collection Time: 04/14/21  5:37 AM   Specimen: BLOOD  Result Value Ref Range Status   Specimen Description   Final    BLOOD LEFT ANTECUBITAL Performed at [facility] [address], [place], [place] [address]    Special Requests   Final    BOTTLES DRAWN AEROBIC ONLY Blood Culture adequate volume Performed at [facility] [address] [place], [place] [address]    Culture   Final    NO GROWTH 3 DAYS Performed at [facility], [place] [address] [place], [place] [address]    Report Status PENDING  Incomplete  Culture, blood (routine x 2)     Status: None (Preliminary result)   Collection Time: 04/14/21  5:38 AM   Specimen: BLOOD  Result Value Ref Range Status   Specimen Description   Final    BLOOD BLOOD LEFT FOREARM Performed at [facility] [address], [place], [place] [address]    Special Requests   Final    BOTTLES DRAWN AEROBIC AND ANAEROBIC Blood Culture adequate volume Performed at [facility] [address] [place], [place] [address]    Culture   Final    NO GROWTH 3 DAYS Performed at [facility], [place] [address], [place], [place] [address]    Report Status PENDING  Incomplete    Procedures and diagnostic studies:  No results found.  Medications:    buPROPion  300 mg Oral Daily   enoxaparin (LOVENOX) injection  40 mg Subcutaneous Q24H    haloperidol  2 mg Oral Once   Or   haloperidol lactate  2 mg Intramuscular Once   insulin aspart  0-15 Units Subcutaneous TID WC   insulin aspart  0-5 Units Subcutaneous QHS   Continuous Infusions:  sodium chloride 250 mL (04/13/21 1528)    ceFAZolin (ANCEF) IV 2 g (04/17/21 0626)     LOS: 4 days   Geradine Girt  [facility]   How to contact the [facility] Attending or Consulting provider [place] or covering provider during after hours [place], for this patient?  Check the care team in [facility] and look for a) attending/consulting [facility] provider listed and b) the [facility] team listed Log into [email or web address] and use 's universal password to access. If you do not have the password, please contact the hospital operator. Locate the [facility] provider you are looking for under [facility] and page to a number that you can be directly reached. If you still have difficulty reaching the provider, please page the [facility] (Director on Call) for the Hospitalists listed on amion for assistance.  04/17/2021, 12:58 PM

## 2021-04-17 NOTE — Progress Notes (Signed)
Subjective:  Low back pain   Antibiotics:  Anti-infectives (From admission, onward)    Start     Dose/Rate Route Frequency Ordered Stop   04/13/21 2200  ceFAZolin (ANCEF) IVPB 2g/100 mL premix        2 g 200 mL/hr over 30 Minutes Intravenous Every 8 hours 04/13/21 1452     04/13/21 1500  ceFAZolin (ANCEF) IVPB 2g/100 mL premix        2 g 200 mL/hr over 30 Minutes Intravenous STAT 04/13/21 1452 04/14/21 1146   04/12/21 1000  cefTRIAXone (ROCEPHIN) 1 g in sodium chloride 0.9 % 100 mL IVPB  Status:  Discontinued        1 g 200 mL/hr over 30 Minutes Intravenous Every 24 hours 04/12/21 0958 04/13/21 1452   04/12/21 0445  ceFEPIme (MAXIPIME) 2 g in sodium chloride 0.9 % 100 mL IVPB        2 g 200 mL/hr over 30 Minutes Intravenous  Once 04/12/21 0436 04/12/21 0520       Medications: Scheduled Meds:  buPROPion  300 mg Oral Daily   enoxaparin (LOVENOX) injection  40 mg Subcutaneous Q24H   haloperidol  2 mg Oral Once   Or   haloperidol lactate  2 mg Intramuscular Once   insulin aspart  0-15 Units Subcutaneous TID WC   insulin aspart  0-5 Units Subcutaneous QHS   Continuous Infusions:  sodium chloride 250 mL (04/13/21 1528)    ceFAZolin (ANCEF) IV 2 g (04/17/21 1446)   PRN Meds:.sodium chloride, acetaminophen **OR** acetaminophen, HYDROcodone-acetaminophen, ipratropium-albuterol, ondansetron **OR** ondansetron (ZOFRAN) IV, senna-docusate    Objective: Weight change:   Intake/Output Summary (Last 24 hours) at 04/17/2021 1454 Last data filed at 04/17/2021 0324 Gross per 24 hour  Intake 240 ml  Output 1350 ml  Net -1110 ml    Blood pressure 136/81, pulse 70, temperature 98 F (36.7 C), resp. rate 20, height '5\' 7"'$  (1.702 m), weight 94.2 kg, SpO2 92 %. Temp:  [97.7 F (36.5 C)-98 F (36.7 C)] 98 F (36.7 C) (09/07 1300) Pulse Rate:  [70-77] 70 (09/07 1246) Resp:  [18-20] 20 (09/07 1246) BP: (133-140)/(81-83) 136/81 (09/07 1246) SpO2:  [92 %-96 %] 92 % (09/07  1246)  Physical Exam: Physical Exam Constitutional:      Appearance: He is well-developed. He is obese.  HENT:     Head: Normocephalic and atraumatic.  Eyes:     Extraocular Movements: Extraocular movements intact.     Conjunctiva/sclera: Conjunctivae normal.     Pupils: Pupils are equal, round, and reactive to light.  Cardiovascular:     Rate and Rhythm: Normal rate and regular rhythm.     Heart sounds: Murmur heard.  Pulmonary:     Effort: Pulmonary effort is normal. No respiratory distress.     Breath sounds: Normal breath sounds. No stridor. No wheezing or rhonchi.  Abdominal:     General: Abdomen is flat. There is no distension.     Palpations: Abdomen is soft.  Musculoskeletal:        General: Normal range of motion.     Cervical back: Normal range of motion and neck supple.  Skin:    General: Skin is warm and dry.     Findings: No erythema or rash.  Neurological:     General: No focal deficit present.     Mental Status: He is alert and oriented to person, place, and time.  Psychiatric:  Mood and Affect: Mood normal.        Behavior: Behavior normal.        Thought Content: Thought content normal.        Judgment: Judgment normal.     No diabetic foot ulcers  Multiple bruises and scars 04/16/2021:      CBC:    BMET Recent Labs    04/17/21 0501  NA 139  K 3.4*  CL 106  CO2 25  GLUCOSE 98  BUN 16  CREATININE 0.68  CALCIUM 8.6*      Liver Panel  No results for input(s): PROT, ALBUMIN, AST, ALT, ALKPHOS, BILITOT, BILIDIR, IBILI in the last 72 hours.      Sedimentation Rate No results for input(s): ESRSEDRATE in the last 72 hours. C-Reactive Protein No results for input(s): CRP in the last 72 hours.  Micro Results: Recent Results (from the past 720 hour(s))  Blood culture (routine x 2)     Status: Abnormal   Collection Time: 04/12/21  4:23 AM   Specimen: BLOOD  Result Value Ref Range Status   Specimen Description   Final     BLOOD BLOOD LEFT HAND Performed at [facility] [address], [place], [place] [address]    Special Requests   Final    BOTTLES DRAWN AEROBIC AND ANAEROBIC Blood Culture adequate volume Performed at [facility] [address] [place], [place] [address]    Culture  Setup Time   Final    GRAM POSITIVE COCCI IN CLUSTERS IN BOTH AEROBIC AND ANAEROBIC BOTTLES CRITICAL VALUE NOTED.  VALUE IS CONSISTENT WITH PREVIOUSLY REPORTED AND CALLED VALUE.    Culture (A)  Final    STAPHYLOCOCCUS AUREUS SUSCEPTIBILITIES PERFORMED ON PREVIOUS CULTURE WITHIN THE LAST 5 DAYS. Performed at [facility], [place] [address] [place], [place] [address]    Report Status 04/15/2021 FINAL  Final  Blood culture (routine x 2)     Status: Abnormal   Collection Time: 04/12/21  4:24 AM   Specimen: Right Antecubital; Blood  Result Value Ref Range Status   Specimen Description   Final    RIGHT ANTECUBITAL Performed at [facility] [address], [place], [place] [address]    Special Requests   Final    BOTTLES DRAWN AEROBIC AND ANAEROBIC Blood Culture results may not be optimal due to an inadequate volume of blood received in culture bottles Performed at [facility] [address], [place], [place] [address]    Culture  Setup Time   Final    GRAM POSITIVE COCCI IN CLUSTERS IN BOTH AEROBIC AND ANAEROBIC BOTTLES CRITICAL RESULT CALLED TO, READ BACK BY AND VERIFIED WITH: L POINDEXTER,PHARMD'@2250'$  04/12/21 [place] Performed at [facility], [place] [address], [place], [place] [address]    Culture STAPHYLOCOCCUS AUREUS (A)  Final   Report Status 04/15/2021 FINAL  Final   Organism ID, Bacteria STAPHYLOCOCCUS AUREUS  Final      Susceptibility   Staphylococcus aureus - MIC*    CIPROFLOXACIN <=0.5 SENSITIVE Sensitive     ERYTHROMYCIN <=0.25 SENSITIVE Sensitive     GENTAMICIN <=0.5 SENSITIVE Sensitive     OXACILLIN <=0.25 SENSITIVE  Sensitive     TETRACYCLINE <=1 SENSITIVE Sensitive     VANCOMYCIN 1 SENSITIVE Sensitive     TRIMETH/SULFA <=10 SENSITIVE Sensitive     CLINDAMYCIN <=0.25 SENSITIVE Sensitive     RIFAMPIN <=0.5 SENSITIVE Sensitive     Inducible Clindamycin NEGATIVE  Sensitive     * STAPHYLOCOCCUS AUREUS  Blood Culture ID Panel (Reflexed)     Status: Abnormal   Collection Time: 04/12/21  4:24 AM  Result Value Ref Range Status   Enterococcus faecalis NOT DETECTED NOT DETECTED Final   Enterococcus Faecium NOT DETECTED NOT DETECTED Final   Listeria monocytogenes NOT DETECTED NOT DETECTED Final   Staphylococcus species DETECTED (A) NOT DETECTED Final    Comment: CRITICAL RESULT CALLED TO, READ BACK BY AND VERIFIED WITH: L POINDEXTER,PHARMD'@2250'$  04/12/21 [place]    Staphylococcus aureus (BCID) DETECTED (A) NOT DETECTED Final    Comment: CRITICAL RESULT CALLED TO, READ BACK BY AND VERIFIED WITH: L POINDEXTER,PHARMD'@2250'$  04/12/21 [place]    Staphylococcus epidermidis NOT DETECTED NOT DETECTED Final   Staphylococcus lugdunensis NOT DETECTED NOT DETECTED Final   Streptococcus species NOT DETECTED NOT DETECTED Final   Streptococcus agalactiae NOT DETECTED NOT DETECTED Final   Streptococcus pneumoniae NOT DETECTED NOT DETECTED Final   Streptococcus pyogenes NOT DETECTED NOT DETECTED Final   A.calcoaceticus-baumannii NOT DETECTED NOT DETECTED Final   Bacteroides fragilis NOT DETECTED NOT DETECTED Final   Enterobacterales NOT DETECTED NOT DETECTED Final   Enterobacter cloacae complex NOT DETECTED NOT DETECTED Final   Escherichia coli NOT DETECTED NOT DETECTED Final   Klebsiella aerogenes NOT DETECTED NOT DETECTED Final   Klebsiella oxytoca NOT DETECTED NOT DETECTED Final   Klebsiella pneumoniae NOT DETECTED NOT DETECTED Final   Proteus species NOT DETECTED NOT DETECTED Final   Salmonella species NOT DETECTED NOT DETECTED Final   Serratia marcescens NOT DETECTED NOT DETECTED Final   Haemophilus influenzae NOT DETECTED  NOT DETECTED Final   Neisseria meningitidis NOT DETECTED NOT DETECTED Final   Pseudomonas aeruginosa NOT DETECTED NOT DETECTED Final   Stenotrophomonas maltophilia NOT DETECTED NOT DETECTED Final   Candida albicans NOT DETECTED NOT DETECTED Final   Candida auris NOT DETECTED NOT DETECTED Final   Candida glabrata NOT DETECTED NOT DETECTED Final   Candida krusei NOT DETECTED NOT DETECTED Final   Candida parapsilosis NOT DETECTED NOT DETECTED Final   Candida tropicalis NOT DETECTED NOT DETECTED Final   Cryptococcus neoformans/gattii NOT DETECTED NOT DETECTED Final   Meth resistant mecA/C and MREJ NOT DETECTED NOT DETECTED Final    Comment: Performed at [facility], [address] [place], [place] [address]  Resp Panel by RT-PCR (Flu A&B, Covid) Nasopharyngeal Swab     Status: None   Collection Time: 04/12/21  7:05 AM   Specimen: Nasopharyngeal Swab; Nasopharyngeal(NP) swabs in vial transport medium  Result Value Ref Range Status   SARS Coronavirus 2 by RT PCR NEGATIVE NEGATIVE Final    Comment: (NOTE) SARS-CoV-2 target nucleic acids are NOT DETECTED.  The SARS-CoV-2 RNA is generally detectable in upper respiratory specimens during the acute phase of infection. The lowest concentration of SARS-CoV-2 viral copies this assay can detect is 138 copies/mL. A negative result does not preclude SARS-Cov-2 infection and should not be used as the sole basis for treatment or other patient management decisions. A negative result may occur with  improper specimen collection/handling, submission of specimen other than nasopharyngeal swab, presence of viral mutation(s) within the areas targeted by this assay, and inadequate number of viral copies(<138 copies/mL). A negative result must be combined with clinical observations, patient history, and epidemiological information. The expected result is Negative.  Fact Sheet for Patients:  EntrepreneurPulse.com.au  Fact Sheet for  Healthcare Providers:  IncredibleEmployment.be  This test is no t yet approved or cleared by the  [place] Architectural technologist and  has been authorized for detection and/or diagnosis of SARS-CoV-2 by FDA under an Print production planner (EUA). This EUA will remain  in effect (meaning this test can be used) for the duration of the COVID-19 declaration under Section 564(b)(1) of the Act, 21 U.S.C.section 360bbb-3(b)(1), unless the authorization is terminated  or revoked sooner.       Influenza A by PCR NEGATIVE NEGATIVE Final   Influenza B by PCR NEGATIVE NEGATIVE Final    Comment: (NOTE) The Xpert Xpress SARS-CoV-2/FLU/RSV plus assay is intended as an aid in the diagnosis of influenza from Nasopharyngeal swab specimens and should not be used as a sole basis for treatment. Nasal washings and aspirates are unacceptable for Xpert Xpress SARS-CoV-2/FLU/RSV testing.  Fact Sheet for Patients: EntrepreneurPulse.com.au  Fact Sheet for Healthcare Providers: IncredibleEmployment.be  This test is not yet approved or cleared by the [place] FDA and has been authorized for detection and/or diagnosis of SARS-CoV-2 by FDA under an Emergency Use Authorization (EUA). This EUA will remain in effect (meaning this test can be used) for the duration of the COVID-19 declaration under Section 564(b)(1) of the Act, 21 U.S.C. section 360bbb-3(b)(1), unless the authorization is terminated or revoked.  Performed at [facility], [place] [address] [place], [place] [address]   Respiratory (~20 pathogens) panel by PCR     Status: None   Collection Time: 04/12/21  9:59 AM   Specimen: Nasopharyngeal Swab; Respiratory  Result Value Ref Range Status   Adenovirus NOT DETECTED NOT DETECTED Final   Coronavirus 229E NOT DETECTED NOT DETECTED Final    Comment: (NOTE) The Coronavirus on the Respiratory Panel, DOES NOT test for the novel   Coronavirus (2019 nCoV)    Coronavirus HKU1 NOT DETECTED NOT DETECTED Final   Coronavirus NL63 NOT DETECTED NOT DETECTED Final   Coronavirus OC43 NOT DETECTED NOT DETECTED Final   Metapneumovirus NOT DETECTED NOT DETECTED Final   Rhinovirus / Enterovirus NOT DETECTED NOT DETECTED Final   Influenza A NOT DETECTED NOT DETECTED Final   Influenza B NOT DETECTED NOT DETECTED Final   Parainfluenza Virus 1 NOT DETECTED NOT DETECTED Final   Parainfluenza Virus 2 NOT DETECTED NOT DETECTED Final   Parainfluenza Virus 3 NOT DETECTED NOT DETECTED Final   Parainfluenza Virus 4 NOT DETECTED NOT DETECTED Final   Respiratory Syncytial Virus NOT DETECTED NOT DETECTED Final   Bordetella pertussis NOT DETECTED NOT DETECTED Final   Bordetella Parapertussis NOT DETECTED NOT DETECTED Final   Chlamydophila pneumoniae NOT DETECTED NOT DETECTED Final   Mycoplasma pneumoniae NOT DETECTED NOT DETECTED Final    Comment: Performed at [facility], [place]. [address] [place], [place] [address]  Culture, blood (routine x 2)     Status: None (Preliminary result)   Collection Time: 04/14/21  5:37 AM   Specimen: BLOOD  Result Value Ref Range Status   Specimen Description   Final    BLOOD LEFT ANTECUBITAL Performed at [facility] [address], [place], [place] [address]    Special Requests   Final    BOTTLES DRAWN AEROBIC ONLY Blood Culture adequate volume Performed at [facility] [address], [place], [place] [address]    Culture   Final    NO GROWTH 3 DAYS Performed at [facility], [place] [address], [place], [place] [address]    Report Status PENDING  Incomplete  Culture, blood (routine x 2)     Status:  None (Preliminary result)   Collection Time: 04/14/21  5:38 AM   Specimen: BLOOD  Result Value Ref Range Status   Specimen Description   Final    BLOOD BLOOD LEFT FOREARM Performed at [facility] [address] [place],  [place] [address]    Special Requests   Final    BOTTLES DRAWN AEROBIC AND ANAEROBIC Blood Culture adequate volume Performed at [facility] [address], [place], [place] [address]    Culture   Final    NO GROWTH 3 DAYS Performed at [facility], [place] [address], [place], [place] [address]    Report Status PENDING  Incomplete    Studies/Results: No results found.    Assessment/Plan:  INTERVAL HISTORY:  repeat blood cultures no growth at 3 days   Active Problems:   MSSA bacteremia   COPD exacerbation (HCC)   Acute respiratory failure with hypoxia (HCC)   S/P AVR   Parkinson disease (HCC)   Diabetic polyneuropathy associated with type 2 diabetes mellitus (HCC)   Chronic bilateral low back pain without sciatica    Michael Hines is a 75 y.o. male with  AVR sp redo in 2013 admitted with sepsis and MSSA bacteremia. He has cleared his blood cultures. TTE did not show vegetations   --continue cefazolin --repeat blood cultures no growth at 3 days --DO NOT YET PLACE PICC --TEE will help [place] understand if need to add RIF and gent  Frequent falls: multifactorial but certainly Parkinson;s if he has that would be significant reason for this    LOS: 4 days   Alcide Evener 04/17/2021, 2:54 PM

## 2021-04-17 NOTE — Progress Notes (Signed)
TEE re-scheduled to tomorrow 04/18/21 at 1pm, please ensure NPO after midnight. Wife Dianne notified.

## 2021-04-18 ENCOUNTER — Encounter (HOSPITAL_COMMUNITY)
Admission: EM | Disposition: A | Discharge status: Skilled Nursing Facility | Payer: Self-pay | Source: Home / Self Care | Attending: Family Medicine

## 2021-04-18 ENCOUNTER — Inpatient Hospital Stay (HOSPITAL_COMMUNITY): Payer: Medicare Other

## 2021-04-18 ENCOUNTER — Inpatient Hospital Stay (HOSPITAL_COMMUNITY): Payer: Medicare Other | Admitting: Anesthesiology

## 2021-04-18 ENCOUNTER — Encounter (HOSPITAL_COMMUNITY): Payer: Self-pay | Admitting: Family Medicine

## 2021-04-18 DIAGNOSIS — R7881 Bacteremia: Secondary | ICD-10-CM

## 2021-04-18 DIAGNOSIS — J441 Chronic obstructive pulmonary disease with (acute) exacerbation: Secondary | ICD-10-CM | POA: Diagnosis not present

## 2021-04-18 DIAGNOSIS — I351 Nonrheumatic aortic (valve) insufficiency: Secondary | ICD-10-CM

## 2021-04-18 DIAGNOSIS — A4101 Sepsis due to Methicillin susceptible Staphylococcus aureus: Principal | ICD-10-CM

## 2021-04-18 DIAGNOSIS — R6521 Severe sepsis with septic shock: Secondary | ICD-10-CM

## 2021-04-18 DIAGNOSIS — G20 Parkinson's disease: Secondary | ICD-10-CM | POA: Diagnosis not present

## 2021-04-18 DIAGNOSIS — M545 Low back pain, unspecified: Secondary | ICD-10-CM | POA: Diagnosis not present

## 2021-04-18 HISTORY — PX: TEE WITHOUT CARDIOVERSION: SHX5443

## 2021-04-18 LAB — BASIC METABOLIC PANEL
Anion gap: 9 (ref 5–15)
BUN: 14 mg/dL (ref 8–23)
CO2: 26 mmol/L (ref 22–32)
Calcium: 8.3 mg/dL — ABNORMAL LOW (ref 8.9–10.3)
Chloride: 101 mmol/L (ref 98–111)
Creatinine, Ser: 0.77 mg/dL (ref 0.61–1.24)
GFR, Estimated: >60 mL/min (ref >60)
Glucose, Bld: 105 mg/dL — ABNORMAL HIGH (ref 70–99)
Potassium: 3.6 mmol/L (ref 3.5–5.1)
Sodium: 136 mmol/L (ref 135–145)

## 2021-04-18 LAB — GLUCOSE, CAPILLARY
Glucose-Capillary: 121 mg/dL — ABNORMAL HIGH (ref 70–99)
Glucose-Capillary: 89 mg/dL (ref 70–99)
Glucose-Capillary: 127 mg/dL — ABNORMAL HIGH (ref 70–99)
Glucose-Capillary: 93 mg/dL (ref 70–99)

## 2021-04-18 LAB — ECHO TEE
AR max vel: 1.78 cm2
AV Area VTI: 1.87 cm2
AV Area mean vel: 1.84 cm2
AV Mean grad: 15 mmHg
AV Peak grad: 26 mmHg
Ao pk vel: 2.55 m/s
P 1/2 time: 631 msec

## 2021-04-18 LAB — SARS CORONAVIRUS 2 (TAT 6-24 HRS): SARS Coronavirus 2: NEGATIVE

## 2021-04-18 SURGERY — ECHOCARDIOGRAM, TRANSESOPHAGEAL
Anesthesia: Monitor Anesthesia Care

## 2021-04-18 MED ORDER — PROPOFOL 10 MG/ML IV BOLUS
INTRAVENOUS | Status: DC | PRN
  Administered 2021-04-18: 30 mg via INTRAVENOUS
  Administered 2021-04-18: 20 mg via INTRAVENOUS
  Administered 2021-04-18: 30 mg via INTRAVENOUS

## 2021-04-18 MED ORDER — BUTAMBEN-TETRACAINE-BENZOCAINE 2-2-14 % EX AERO
INHALATION_SPRAY | CUTANEOUS | Status: DC | PRN
  Administered 2021-04-18: 1 via TOPICAL

## 2021-04-18 MED ORDER — SODIUM CHLORIDE 0.9 % IV SOLN: INTRAVENOUS | Status: DC | PRN

## 2021-04-18 MED ORDER — PROPOFOL 500 MG/50ML IV EMUL
INTRAVENOUS | Status: DC | PRN
  Administered 2021-04-18: 100 ug/kg/min via INTRAVENOUS

## 2021-04-18 MED ORDER — SODIUM CHLORIDE 0.9 % IV SOLN
INTRAVENOUS | Status: DC
Start: 2021-04-18 — End: 2021-04-20

## 2021-04-18 NOTE — Transfer of Care (Signed)
Immediate Anesthesia Transfer of Care Note  Patient: Michael Hines  Procedure(s) Performed: TRANSESOPHAGEAL ECHOCARDIOGRAM (TEE)  Patient Location: Endoscopy Unit  Anesthesia Type:MAC  Level of Consciousness: drowsy and patient cooperative  Airway & Oxygen Therapy: Patient Spontanous Breathing and Patient connected to nasal cannula oxygen  Post-op Assessment: Report given to RN, Post -op Vital signs reviewed and stable and Patient moving all extremities  Post vital signs: Reviewed and stable  Last Vitals:  Vitals Value Taken Time  BP 98/84 04/18/21 1436  Temp 36.4 C 04/18/21 1436  Pulse 75 04/18/21 1440  Resp 19 04/18/21 1440  SpO2 89 % 04/18/21 1440  Vitals shown include unvalidated device data.  Last Pain:  Vitals:   04/18/21 1436  TempSrc: Temporal  PainSc: 0-No pain      Patients Stated Pain Goal: 2 (43/56/86 1683)  Complications: No notable events documented.

## 2021-04-18 NOTE — TOC Progression Note (Signed)
Transition of Care [facility]) - Progression Note    Patient Details  Name: Michael Hines MRN: [id number] Date of Birth: April 10, 1946  Transition of Care [facility]) CM/SW [place], [place] Phone Number: 04/18/2021, 2:09 PM  Clinical Narrative:   Received insurance authorization for SNF, Reference [id number], 9/9-9/13. Waiting on abx recommendation for facility to make formal bed offer. TOC will continue to follow during the course of hospitalization.     Expected Discharge Plan: Skilled Nursing Facility Barriers to Discharge: SNF Pending bed offer  Expected Discharge Plan and Services Expected Discharge Plan: [place]   Discharge Planning Services: CM Consult Post Acute Care Choice: [place] Living arrangements for the past 2 months: Single Family Home                                       Social Determinants of Health (SDOH) Interventions    Readmission Risk Interventions No flowsheet data found.

## 2021-04-18 NOTE — Progress Notes (Addendum)
Subjective:  No new complaints   Antibiotics:  Anti-infectives (From admission, onward)    Start     Dose/Rate Route Frequency Ordered Stop   04/13/21 2200  ceFAZolin (ANCEF) IVPB 2g/100 mL premix        2 g 200 mL/hr over 30 Minutes Intravenous Every 8 hours 04/13/21 1452     04/13/21 1500  ceFAZolin (ANCEF) IVPB 2g/100 mL premix        2 g 200 mL/hr over 30 Minutes Intravenous STAT 04/13/21 1452 04/14/21 1146   04/12/21 1000  cefTRIAXone (ROCEPHIN) 1 g in sodium chloride 0.9 % 100 mL IVPB  Status:  Discontinued        1 g 200 mL/hr over 30 Minutes Intravenous Every 24 hours 04/12/21 0958 04/13/21 1452   04/12/21 0445  ceFEPIme (MAXIPIME) 2 g in sodium chloride 0.9 % 100 mL IVPB        2 g 200 mL/hr over 30 Minutes Intravenous  Once 04/12/21 0436 04/12/21 0520       Medications: Scheduled Meds:  buPROPion  300 mg Oral Daily   enoxaparin (LOVENOX) injection  40 mg Subcutaneous Q24H   haloperidol  2 mg Oral Once   Or   haloperidol lactate  2 mg Intramuscular Once   insulin aspart  0-15 Units Subcutaneous TID WC   insulin aspart  0-5 Units Subcutaneous QHS   Continuous Infusions:  sodium chloride 250 mL (04/13/21 1528)   sodium chloride      ceFAZolin (ANCEF) IV 2 g (04/18/21 1739)   PRN Meds:.sodium chloride, acetaminophen **OR** acetaminophen, alum & mag hydroxide-simeth, HYDROcodone-acetaminophen, ipratropium-albuterol, ondansetron **OR** ondansetron (ZOFRAN) IV, senna-docusate    Objective: Weight change:   Intake/Output Summary (Last 24 hours) at 04/18/2021 1837 Last data filed at 04/18/2021 1433 Gross per 24 hour  Intake 400 ml  Output 150 ml  Net 250 ml    Blood pressure 124/65, pulse 84, temperature 98.1 F (36.7 C), temperature source Oral, resp. rate 19, height '5\' 7"'  (1.702 m), weight 94.2 kg, SpO2 95 %. Temp:  [97.5 F (36.4 C)-98.6 F (37 C)] 98.1 F (36.7 C) (09/08 1545) Pulse Rate:  [78-100] 84 (09/08 1545) Resp:  [16-25] 19 (09/08  1455) BP: (98-162)/(64-98) 124/65 (09/08 1545) SpO2:  [88 %-97 %] 95 % (09/08 1545)  Physical Exam: Physical Exam Vitals and nursing note reviewed.  Constitutional:      Appearance: He is well-developed.  HENT:     Head: Normocephalic and atraumatic.  Eyes:     Conjunctiva/sclera: Conjunctivae normal.  Cardiovascular:     Rate and Rhythm: Normal rate and regular rhythm.     Pulses: Normal pulses.     Heart sounds: Murmur heard.  Pulmonary:     Effort: Pulmonary effort is normal. No respiratory distress.     Breath sounds: Normal breath sounds. No stridor. No wheezing.  Abdominal:     General: There is no distension.     Palpations: Abdomen is soft.  Musculoskeletal:        General: Normal range of motion.     Cervical back: Normal range of motion and neck supple.  Skin:    General: Skin is warm and dry.     Findings: No erythema or rash.  Neurological:     General: No focal deficit present.     Mental Status: He is alert and oriented to person, place, and time.  Psychiatric:        Mood and  Affect: Mood normal.        Behavior: Behavior normal.        Thought Content: Thought content normal.        Judgment: Judgment normal.     No diabetic foot ulcers  Multiple bruises and scars 04/16/2021:      CBC:    BMET Recent Labs    04/17/21 0501 04/18/21 0447  NA 139 136  K 3.4* 3.6  CL 106 101  CO2 25 26  GLUCOSE 98 105*  BUN 16 14  CREATININE 0.68 0.77  CALCIUM 8.6* 8.3*      Liver Panel  No results for input(s): PROT, ALBUMIN, AST, ALT, ALKPHOS, BILITOT, BILIDIR, IBILI in the last 72 hours.      Sedimentation Rate No results for input(s): ESRSEDRATE in the last 72 hours. C-Reactive Protein No results for input(s): CRP in the last 72 hours.  Micro Results: Recent Results (from the past 720 hour(s))  Blood culture (routine x 2)     Status: Abnormal   Collection Time: 04/12/21  4:23 AM   Specimen: BLOOD  Result Value Ref Range Status    Specimen Description   Final    BLOOD BLOOD LEFT HAND Performed at [facility] [address], [place], [place] [address]    Special Requests   Final    BOTTLES DRAWN AEROBIC AND ANAEROBIC Blood Culture adequate volume Performed at [facility] [address], [place], [place] [address]    Culture  Setup Time   Final    GRAM POSITIVE COCCI IN CLUSTERS IN BOTH AEROBIC AND ANAEROBIC BOTTLES CRITICAL VALUE NOTED.  VALUE IS CONSISTENT WITH PREVIOUSLY REPORTED AND CALLED VALUE.    Culture (A)  Final    STAPHYLOCOCCUS AUREUS SUSCEPTIBILITIES PERFORMED ON PREVIOUS CULTURE WITHIN THE LAST 5 DAYS. Performed at [facility], [place] [address], [place], [place] [address]    Report Status 04/15/2021 FINAL  Final  Blood culture (routine x 2)     Status: Abnormal   Collection Time: 04/12/21  4:24 AM   Specimen: Right Antecubital; Blood  Result Value Ref Range Status   Specimen Description   Final    RIGHT ANTECUBITAL Performed at [facility] [address], [place], [place] [address]    Special Requests   Final    BOTTLES DRAWN AEROBIC AND ANAEROBIC Blood Culture results may not be optimal due to an inadequate volume of blood received in culture bottles Performed at [facility] [address] [place], [place] [address]    Culture  Setup Time   Final    GRAM POSITIVE COCCI IN CLUSTERS IN BOTH AEROBIC AND ANAEROBIC BOTTLES CRITICAL RESULT CALLED TO, READ BACK BY AND VERIFIED WITH: L POINDEXTER,PHARMD'@2250'  04/12/21 [place] Performed at [facility], [place] [address], [place],  [address]    Culture STAPHYLOCOCCUS AUREUS (A)  Final   Report Status 04/15/2021 FINAL  Final   Organism ID, Bacteria STAPHYLOCOCCUS AUREUS  Final      Susceptibility   Staphylococcus aureus - MIC*    CIPROFLOXACIN <=0.5 SENSITIVE Sensitive     ERYTHROMYCIN <=0.25 SENSITIVE Sensitive     GENTAMICIN <=0.5 SENSITIVE Sensitive      OXACILLIN <=0.25 SENSITIVE Sensitive     TETRACYCLINE <=1 SENSITIVE Sensitive     VANCOMYCIN 1 SENSITIVE Sensitive     TRIMETH/SULFA <=10 SENSITIVE Sensitive     CLINDAMYCIN <=0.25 SENSITIVE Sensitive     RIFAMPIN <=0.5 SENSITIVE  Sensitive     Inducible Clindamycin NEGATIVE Sensitive     * STAPHYLOCOCCUS AUREUS  Blood Culture ID Panel (Reflexed)     Status: Abnormal   Collection Time: 04/12/21  4:24 AM  Result Value Ref Range Status   Enterococcus faecalis NOT DETECTED NOT DETECTED Final   Enterococcus Faecium NOT DETECTED NOT DETECTED Final   Listeria monocytogenes NOT DETECTED NOT DETECTED Final   Staphylococcus species DETECTED (A) NOT DETECTED Final    Comment: CRITICAL RESULT CALLED TO, READ BACK BY AND VERIFIED WITH: L POINDEXTER,PHARMD'@2250'  04/12/21 [place]    Staphylococcus aureus (BCID) DETECTED (A) NOT DETECTED Final    Comment: CRITICAL RESULT CALLED TO, READ BACK BY AND VERIFIED WITH: L POINDEXTER,PHARMD'@2250'  04/12/21 [place]    Staphylococcus epidermidis NOT DETECTED NOT DETECTED Final   Staphylococcus lugdunensis NOT DETECTED NOT DETECTED Final   Streptococcus species NOT DETECTED NOT DETECTED Final   Streptococcus agalactiae NOT DETECTED NOT DETECTED Final   Streptococcus pneumoniae NOT DETECTED NOT DETECTED Final   Streptococcus pyogenes NOT DETECTED NOT DETECTED Final   A.calcoaceticus-baumannii NOT DETECTED NOT DETECTED Final   Bacteroides fragilis NOT DETECTED NOT DETECTED Final   Enterobacterales NOT DETECTED NOT DETECTED Final   Enterobacter cloacae complex NOT DETECTED NOT DETECTED Final   Escherichia coli NOT DETECTED NOT DETECTED Final   Klebsiella aerogenes NOT DETECTED NOT DETECTED Final   Klebsiella oxytoca NOT DETECTED NOT DETECTED Final   Klebsiella pneumoniae NOT DETECTED NOT DETECTED Final   Proteus species NOT DETECTED NOT DETECTED Final   Salmonella species NOT DETECTED NOT DETECTED Final   Serratia marcescens NOT DETECTED NOT DETECTED Final    Haemophilus influenzae NOT DETECTED NOT DETECTED Final   Neisseria meningitidis NOT DETECTED NOT DETECTED Final   Pseudomonas aeruginosa NOT DETECTED NOT DETECTED Final   Stenotrophomonas maltophilia NOT DETECTED NOT DETECTED Final   Candida albicans NOT DETECTED NOT DETECTED Final   Candida auris NOT DETECTED NOT DETECTED Final   Candida glabrata NOT DETECTED NOT DETECTED Final   Candida krusei NOT DETECTED NOT DETECTED Final   Candida parapsilosis NOT DETECTED NOT DETECTED Final   Candida tropicalis NOT DETECTED NOT DETECTED Final   Cryptococcus neoformans/gattii NOT DETECTED NOT DETECTED Final   Meth resistant mecA/C and MREJ NOT DETECTED NOT DETECTED Final    Comment: Performed at [facility], [address] [place], [place] [address]  Resp Panel by RT-PCR (Flu A&B, Covid) Nasopharyngeal Swab     Status: None   Collection Time: 04/12/21  7:05 AM   Specimen: Nasopharyngeal Swab; Nasopharyngeal(NP) swabs in vial transport medium  Result Value Ref Range Status   SARS Coronavirus 2 by RT PCR NEGATIVE NEGATIVE Final    Comment: (NOTE) SARS-CoV-2 target nucleic acids are NOT DETECTED.  The SARS-CoV-2 RNA is generally detectable in upper respiratory specimens during the acute phase of infection. The lowest concentration of SARS-CoV-2 viral copies this assay can detect is 138 copies/mL. A negative result does not preclude SARS-Cov-2 infection and should not be used as the sole basis for treatment or other patient management decisions. A negative result may occur with  improper specimen collection/handling, submission of specimen other than nasopharyngeal swab, presence of viral mutation(s) within the areas targeted by this assay, and inadequate number of viral copies(<138 copies/mL). A negative result must be combined with clinical observations, patient history, and epidemiological information. The expected result is Negative.  Fact Sheet for Patients:   EntrepreneurPulse.com.au  Fact Sheet for Healthcare Providers:  IncredibleEmployment.be  This test is  no t yet approved or cleared by the [place] and  has been authorized for detection and/or diagnosis of SARS-CoV-2 by FDA under an Emergency Use Authorization (EUA). This EUA will remain  in effect (meaning this test can be used) for the duration of the COVID-19 declaration under Section 564(b)(1) of the Act, 21 U.S.C.section 360bbb-3(b)(1), unless the authorization is terminated  or revoked sooner.       Influenza A by PCR NEGATIVE NEGATIVE Final   Influenza B by PCR NEGATIVE NEGATIVE Final    Comment: (NOTE) The Xpert Xpress SARS-CoV-2/FLU/RSV plus assay is intended as an aid in the diagnosis of influenza from Nasopharyngeal swab specimens and should not be used as a sole basis for treatment. Nasal washings and aspirates are unacceptable for Xpert Xpress SARS-CoV-2/FLU/RSV testing.  Fact Sheet for Patients: EntrepreneurPulse.com.au  Fact Sheet for Healthcare Providers: IncredibleEmployment.be  This test is not yet approved or cleared by the [place] FDA and has been authorized for detection and/or diagnosis of SARS-CoV-2 by FDA under an Emergency Use Authorization (EUA). This EUA will remain in effect (meaning this test can be used) for the duration of the COVID-19 declaration under Section 564(b)(1) of the Act, 21 U.S.C. section 360bbb-3(b)(1), unless the authorization is terminated or revoked.  Performed at [facility], [place] [address], [place], [place] [address]   Respiratory (~20 pathogens) panel by PCR     Status: None   Collection Time: 04/12/21  9:59 AM   Specimen: Nasopharyngeal Swab; Respiratory  Result Value Ref Range Status   Adenovirus NOT DETECTED NOT DETECTED Final   Coronavirus 229E NOT DETECTED NOT DETECTED Final    Comment: (NOTE) The Coronavirus  on the Respiratory Panel, DOES NOT test for the novel  Coronavirus (2019 nCoV)    Coronavirus HKU1 NOT DETECTED NOT DETECTED Final   Coronavirus NL63 NOT DETECTED NOT DETECTED Final   Coronavirus OC43 NOT DETECTED NOT DETECTED Final   Metapneumovirus NOT DETECTED NOT DETECTED Final   Rhinovirus / Enterovirus NOT DETECTED NOT DETECTED Final   Influenza A NOT DETECTED NOT DETECTED Final   Influenza B NOT DETECTED NOT DETECTED Final   Parainfluenza Virus 1 NOT DETECTED NOT DETECTED Final   Parainfluenza Virus 2 NOT DETECTED NOT DETECTED Final   Parainfluenza Virus 3 NOT DETECTED NOT DETECTED Final   Parainfluenza Virus 4 NOT DETECTED NOT DETECTED Final   Respiratory Syncytial Virus NOT DETECTED NOT DETECTED Final   Bordetella pertussis NOT DETECTED NOT DETECTED Final   Bordetella Parapertussis NOT DETECTED NOT DETECTED Final   Chlamydophila pneumoniae NOT DETECTED NOT DETECTED Final   Mycoplasma pneumoniae NOT DETECTED NOT DETECTED Final    Comment: Performed at [facility], [place]. [address], [place], [place] [address]  Culture, blood (routine x 2)     Status: None (Preliminary result)   Collection Time: 04/14/21  5:37 AM   Specimen: BLOOD  Result Value Ref Range Status   Specimen Description   Final    BLOOD LEFT ANTECUBITAL Performed at [facility] [address], [place], [place] [address]    Special Requests   Final    BOTTLES DRAWN AEROBIC ONLY Blood Culture adequate volume Performed at [facility] [address], [place], [place] [address]    Culture   Final    NO GROWTH 4 DAYS Performed at [facility], [place] [address], [place],  [address]    Report Status PENDING  Incomplete  Culture, blood (  routine x 2)     Status: None (Preliminary result)   Collection Time: 04/14/21  5:38 AM   Specimen: BLOOD  Result Value Ref Range Status   Specimen Description   Final    BLOOD BLOOD LEFT FOREARM Performed at [facility] [address], [place], [place] [address]    Special Requests   Final    BOTTLES DRAWN AEROBIC AND ANAEROBIC Blood Culture adequate volume Performed at [facility] [address], [place], [place] [address]    Culture   Final    NO GROWTH 4 DAYS Performed at [facility], [place] [address], [place], [place] [address]    Report Status PENDING  Incomplete  SARS CORONAVIRUS 2 (TAT 6-24 HRS) Nasopharyngeal Nasopharyngeal Swab     Status: None   Collection Time: 04/18/21 11:10 AM   Specimen: Nasopharyngeal Swab  Result Value Ref Range Status   SARS Coronavirus 2 NEGATIVE NEGATIVE Final    Comment: (NOTE) SARS-CoV-2 target nucleic acids are NOT DETECTED.  The SARS-CoV-2 RNA is generally detectable in upper and lower respiratory specimens during the acute phase of infection. Negative results do not preclude SARS-CoV-2 infection, do not rule out co-infections with other pathogens, and should not be used as the sole basis for treatment or other patient management decisions. Negative results must be combined with clinical observations, patient history, and epidemiological information. The expected result is Negative.  Fact Sheet for Patients: SugarRoll.be  Fact Sheet for Healthcare Providers: https://www.woods-mathews.com/  This test is not yet approved or cleared by the [place] FDA and  has been authorized for detection and/or diagnosis of SARS-CoV-2 by FDA under an Emergency Use Authorization (EUA). This EUA will remain  in effect (meaning this test can be used) for the duration of the COVID-19 declaration under Se ction 564(b)(1) of the Act, 21 U.S.C. section 360bbb-3(b)(1), unless the authorization is terminated or revoked sooner.  Performed at [facility], [place] [address] [place], [place] [address]     Studies/Results: ECHO TEE  Result Date: 04/18/2021    TRANSESOPHOGEAL ECHO  REPORT   Patient Name:   Michael Hines Date of Exam: 04/18/2021 Medical Rec #:  [id number]         Height:       67.0 in Accession #:    [id number]        Weight:       207.6 lb Date of Birth:  1945-12-08         BSA:          2.055 m Patient Age:    21 years          BP:           142/96 mmHg Patient Gender: M                 HR:           114 bpm. Exam Location:  Inpatient Procedure: 3D Echo, 2D Echo, Transesophageal Echo and Color Doppler Indications:     Bacteremia  History:         Patient has prior history of Echocardiogram examinations, most                  recent 04/13/2021. COPD; Risk Factors:Diabetes and Dyslipidemia.                  Aortic Valve replaced with 5m Edwards Pericardial Tissue  Valve in Dec 2011, Redo Valve Replacement with 44m Pericardial                  Tissue Valve in Dec 2013.                  Aortic Valve: 21 mm bioprosthetic valve is present in the                  aortic position. Procedure Date: 07/18/2010.  Sonographer:     Michael Hines Referring Phys:  [id number] ButtsDiagnosing Phys: Michael ChiquitoMD PROCEDURE: After discussion of the risks and benefits of a TEE, an informed consent was obtained from the patient. TEE procedure time was 22 minutes. The transesophogeal probe was passed without difficulty through the esophogus of the patient. Imaged were obtained with the patient in a left lateral decubitus position. Local oropharyngeal anesthetic was provided with Cetacaine. Sedation performed by different physician. The patient was monitored while under deep sedation. Anesthestetic sedation was provided intravenously by Anesthesiology: 3768mof Propofol. Image quality was excellent. The patient's vital signs; including heart rate, blood pressure, and oxygen saturation; remained stable throughout the procedure. The patient developed no complications during the procedure. IMPRESSIONS  1. 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 V max 2.6 m/s, MG 15  mmHG, EOA 1.87 cm2, DI 0.54. No definite evidence of vegetation. The leaflets appear thickened and there is mild central regurgitation. Suspect the regurgitation is due to valve deterioration given the age of the valve and thickened leaflet appearance. No definitive evidence of aortic root abscess. Would recommend close echo follow-up after completion of antibiotics but no definitive evidence of endocarditis. The aortic valve has been repaired/replaced. Aortic valve regurgitation is mild. 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 Procedure Date: 07/18/2010.  2. Left ventricular ejection fraction, by estimation, is 60 to 65%. The left ventricle has normal function.  3. Right ventricular systolic function is normal. The right ventricular size is normal.  4. No left atrial/left atrial appendage thrombus was detected.  5. The mitral valve is degenerative. Mild mitral valve regurgitation. No evidence of mitral stenosis.  6. Aneurysm of the ascending aorta, measuring 45 mm. There is Moderate (Grade III) layered plaque involving the descending aorta. Conclusion(s)/Recommendation(s): No evidence of vegetation/infective endocarditis on this transesophageal echocardiogram. FINDINGS  Left Ventricle: Left ventricular ejection fraction, by estimation, is 60 to 65%. The left ventricle has normal function. The left ventricular internal cavity size was normal in size. Right Ventricle: The right ventricular size is normal. No increase in right ventricular wall thickness. Right ventricular systolic function is normal. Left Atrium: Left atrial size was normal in size. No left atrial/left atrial appendage thrombus was detected. Right Atrium: Right atrial size was normal in size. Pericardium: Trivial pericardial effusion is present. Mitral Valve: The mitral valve is degenerative in appearance. Mild mitral valve regurgitation. No evidence of mitral valve stenosis. There is no evidence of mitral valve vegetation.  Tricuspid Valve: The tricuspid valve is grossly normal. Tricuspid valve regurgitation is trivial. No evidence of tricuspid stenosis. There is no evidence of tricuspid valve vegetation. Aortic Valve: 21 mm bioprosthetic aortic valve present. V max 2.6 m/s, MG 15 mmHG, EOA 1.87 cm2, DI 0.54. No definite evidence of vegetation. The leaflets appear thickened and there is mild central regurgitation. Suspect the regurgitation is due to valve  deterioration given the age of the valve and thickened leaflet appearance. No definitive evidence of aortic root  abscess. Would recommend close echo follow-up after completion of antibiotics but no definitive evidence of endocarditis. The aortic valve has been repaired/replaced. Aortic valve regurgitation is mild. Aortic regurgitation PHT measures 631 msec. Aortic valve mean gradient measures 15.0 mmHg. Aortic valve peak gradient measures 26.0 mmHg. Aortic valve area, by VTI measures 1.87 cm. 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 Procedure Date: 07/18/2010. Pulmonic Valve: The pulmonic valve was grossly normal. Pulmonic valve regurgitation is not visualized. No evidence of pulmonic stenosis. There is no evidence of pulmonic valve vegetation. Aorta: The aortic root is normal in size and structure. There is an aneurysm involving the ascending aorta measuring 45 mm. There is moderate (Grade III) layered plaque involving the descending aorta. Venous: The left upper pulmonary vein, left lower pulmonary vein, right lower pulmonary vein and right upper pulmonary vein are normal. IAS/Shunts: The interatrial septum appears to be lipomatous. No atrial level shunt detected by color flow Doppler.  LEFT VENTRICLE PLAX 2D LVOT diam:     2.10 cm LV SV:         68 LV SV Index:   33 LVOT Area:     3.46 cm  AORTIC VALVE AV Area (Vmax):    1.78 cm AV Area (Vmean):   1.84 cm AV Area (VTI):     1.87 cm AV Vmax:           255.00 cm/s AV Vmean:          179.000 cm/s AV  VTI:            0.365 m AV Peak Grad:      26.0 mmHg AV Mean Grad:      15.0 mmHg LVOT Vmax:         131.00 cm/s LVOT Vmean:        95.300 cm/s LVOT VTI:          0.197 m LVOT/AV VTI ratio: 0.54 AI PHT:            631 msec  AORTA Ao Root diam: 3.50 cm  SHUNTS Systemic VTI:  0.20 m Systemic Diam: 2.10 cm Eleonore Chiquito MD Electronically signed by Eleonore Chiquito MD Signature Date/Time: 04/18/2021/4:32:36 PM    Final       Assessment/Plan:  INTERVAL HISTORY:  TEE without vegetations   Principal Problem:   MSSA bacteremia Active Problems:   Sepsis ([place])   COPD exacerbation ([place])   Acute respiratory failure with hypoxia (HCC)   S/P AVR   Parkinson disease (HCC)   Diabetic polyneuropathy associated with type 2 diabetes mellitus (HCC)   Chronic bilateral low back pain without sciatica    Michael Hines is a 75 y.o. male with  AVR sp redo in 2013 admitted with sepsis and MSSA bacteremia. He has cleared his blood cultures. TTE did not show vegetations  TEE reviewed and no vegetations though structural deterioration of the valve  Blood cultures negative x 4 days  Provided blood cultures still no growth tomorrow can place PICC ane we will provide with 4 weeks of cefazolin from date of negative blood cultures  Frequent falls: Parkinsons and multifactorial   Diagnosis:  MSSA bacteremia  Culture Result: MSSA  No Known Allergies  OPAT Orders Discharge antibiotics:  Cefazolin   Duration:  4 weeks End Date:  [id number]  [facility] Care Per Protocol:  Labs  weekly while on IV antibiotics: _x_ CBC with differential _x_ BMP w GFR/CMP __x CRP _x_ ESR  _x_ Please pull PIC at completion of IV antibiotics __ Please leave PIC in place until doctor has seen patient or been notified  Fax weekly labs to [phone]  Clinic Follow Up Appt:   ELSON ULBRICH has an appointment on 05/13/2021 at 42PM   The [facility] is located in the [facility] at  [address] in [place].  [address], which is located to the left of the elevators.  Phone: [phone]  Fax: [phone]  [email or web address]  He should arrive 15-30 minutes prior to his appointment.  I spent more than 338  minutes with the patient including face to face counseling of the patient re his MSSA bactereami personally reviewing TEE, updated blood cultures, BMP CBC along with  review of medical records before and during the visit and in coordination of his care with Dr. Eliseo Squires.     LOS: 5 days   Michael Hines 04/18/2021, 6:37 PM

## 2021-04-18 NOTE — Progress Notes (Signed)
Progress Note    Michael Hines  [id number] DOB: [birth year]  DOA: 04/12/2021 PCP: Finis Bud, MD    Brief Narrative:     Medical records reviewed and are as summarized below:  Michael Hines is an 75 y.o. male with medical history significant of tobacco abuse, COPD, DM2, anxiety. Presenting with dyspnea and confusion.  Blood cultures grew MSSA, started on IV cefazolin.  ID consulted and TEE being done.   Assessment/Plan:   Principal Problem:   MSSA bacteremia Active Problems:   Sepsis ([place])   COPD exacerbation ([place])   Acute respiratory failure with hypoxia ([facility])   S/P AVR   Parkinson disease ([facility])   Diabetic polyneuropathy associated with type 2 diabetes mellitus ([place])   Chronic bilateral low back pain without sciatica   MSSA bacteremia -Patient started on IV cefazolin. -No clear source; ID consulted -ID recommends TEE, as patient has history of aortic valve replacement in 2013 -MRI lumbar spine only showed multilevel spondylosis, no discitis or abscess. - cardiology consulted for TEE; scheduled for today   Metabolic encephalopathy/delirium -In setting of MSSA bacteremia -Continue to monitor   Dyspnea -Resolved; likely was tachypneic from bacteremia - discontinued prednisone  -Continue DuoNeb nebulization every 6 hours as needed   Recent falls -PT evaluation obtained -Recommended to go to rehab- family agreeable   Diabetes mellitus type 2 -Continue sliding scale insulin with NovoLog -CBG well controlled  obesity Body mass index is 32.51 kg/m.   Family Communication/Anticipated D/C date and plan/Code Status   DVT prophylaxis: Lovenox ordered. Code Status: Full Code.  Disposition Plan: Status is: Inpatient Wife at bedside  Remains inpatient appropriate because:Inpatient level of care appropriate due to severity of illness  Dispo: The patient is from: Home              Anticipated d/c is to: SNF              Patient currently is  not medically stable to d/c.TEE 9/8 and then SNF on Friday once abx have been decided   Difficult to place patient No         Medical Consultants:   ID cards    Subjective:  Would prefer to go home but knows he needs rehab- wants to get TEE overwith  Objective:    Vitals:   04/17/21 1300 04/17/21 2027 04/18/21 0507 04/18/21 1304  BP:  (!) 143/90 (!) 148/96 (!) 162/92  Pulse:  100 88 86  Resp:  18 20 (!) 25  Temp: 98 F (36.7 C) 97.6 F (36.4 C) 97.7 F (36.5 C) 98.6 F (37 C)  TempSrc:  Oral Oral Temporal  SpO2:  96% 96% 97%  Weight:      Height:        Intake/Output Summary (Last 24 hours) at 04/18/2021 1425 Last data filed at 04/18/2021 [id number] Gross per 24 hour  Intake --  Output 700 ml  Net -700 ml   Filed Weights   04/13/21 1527  Weight: 94.2 kg    Exam:  General: Appearance:    Obese male in no acute distress     Lungs:     respirations unlabored  Heart:    Normal heart rate.   MS:   All extremities are intact.    Neurologic:   Awake, alert    Data Reviewed:   I have personally reviewed following labs and imaging studies:  Labs: Labs show the following:   Basic Metabolic Panel: Recent Labs  Lab 04/12/21 0423 04/12/21 0959 04/13/21 0556 04/14/21 0537 04/17/21 0501 04/18/21 0447  NA 137  --  138 136 139 136  K 4.3  --  3.6 3.6 3.4* 3.6  CL 100  --  105 100 106 101  CO2 29  --  '26 25 25 26  '$ GLUCOSE 123*  --  195* 103* 98 105*  BUN 15  --  19 24* 16 14  CREATININE 1.11 0.96 0.79 1.00 0.68 0.77  CALCIUM 8.8*  --  8.8* 8.7* 8.6* 8.3*   GFR Estimated Creatinine Clearance: 89.9 mL/min (by C-G formula based on SCr of 0.77 mg/dL). Liver Function Tests: Recent Labs  Lab 04/12/21 0423 04/13/21 0556 04/14/21 0537  AST '16 18 29  '$ ALT '18 22 28  '$ ALKPHOS 81 48 49  BILITOT 0.7 0.6 0.7  PROT 7.5 5.8* 6.3*  ALBUMIN 3.8 2.9* 3.0*   No results for input(s): LIPASE, AMYLASE in the last 168 hours. No results for input(s): AMMONIA in the last  168 hours. Coagulation profile Recent Labs  Lab 04/12/21 0423  INR 0.8    CBC: Recent Labs  Lab 04/12/21 0423 04/12/21 0959 04/13/21 0556 04/14/21 0537 04/17/21 0501  WBC 11.4* 11.6* 10.7* 6.0 8.2  NEUTROABS 9.5*  --   --   --   --   HGB 16.9 15.8 13.6 15.1 14.9  HCT 52.1* 47.6 40.3 44.7 43.7  MCV 95.9 94.8 92.2 92.5 91.6  PLT 187 162 151 122* 141*   Cardiac Enzymes: No results for input(s): CKTOTAL, CKMB, CKMBINDEX, TROPONINI in the last 168 hours. BNP (last 3 results) No results for input(s): PROBNP in the last 8760 hours. CBG: Recent Labs  Lab 04/17/21 1211 04/17/21 1642 04/17/21 2030 04/18/21 0753 04/18/21 1141  GLUCAP 110* 160* 215* 121* 89   D-Dimer: No results for input(s): DDIMER in the last 72 hours. Hgb A1c: No results for input(s): HGBA1C in the last 72 hours. Lipid Profile: No results for input(s): CHOL, HDL, LDLCALC, TRIG, CHOLHDL, LDLDIRECT in the last 72 hours. Thyroid function studies: No results for input(s): TSH, T4TOTAL, T3FREE, THYROIDAB in the last 72 hours.  Invalid input(s): FREET3 Anemia work up: No results for input(s): VITAMINB12, FOLATE, FERRITIN, TIBC, IRON, RETICCTPCT in the last 72 hours. Sepsis Labs: Recent Labs  Lab 04/12/21 0423 04/12/21 0616 04/12/21 0959 04/13/21 0556 04/14/21 0537 04/17/21 0501  WBC 11.4*  --  11.6* 10.7* 6.0 8.2  LATICACIDVEN 1.5 1.0  --   --   --   --     Microbiology Recent Results (from the past 240 hour(s))  Blood culture (routine x 2)     Status: Abnormal   Collection Time: 04/12/21  4:23 AM   Specimen: BLOOD  Result Value Ref Range Status   Specimen Description   Final    BLOOD BLOOD LEFT HAND Performed at [facility], [place] [address], [place], [place] [address]    Special Requests   Final    BOTTLES DRAWN AEROBIC AND ANAEROBIC Blood Culture adequate volume Performed at [facility] [address], [place], [place] [address]    Culture  Setup  Time   Final    GRAM POSITIVE COCCI IN CLUSTERS IN BOTH AEROBIC AND ANAEROBIC BOTTLES CRITICAL VALUE NOTED.  VALUE IS CONSISTENT WITH PREVIOUSLY REPORTED AND CALLED VALUE.    Culture (A)  Final    STAPHYLOCOCCUS AUREUS SUSCEPTIBILITIES PERFORMED ON PREVIOUS CULTURE WITHIN THE LAST 5 DAYS. Performed at [facility], [place]  [address], [place], [place] [address]    Report Status 04/15/2021 FINAL  Final  Blood culture (routine x 2)     Status: Abnormal   Collection Time: 04/12/21  4:24 AM   Specimen: Right Antecubital; Blood  Result Value Ref Range Status   Specimen Description   Final    RIGHT ANTECUBITAL Performed at [facility] [address] [place], [place] [address]    Special Requests   Final    BOTTLES DRAWN AEROBIC AND ANAEROBIC Blood Culture results may not be optimal due to an inadequate volume of blood received in culture bottles Performed at [facility] [address], [place], [place] [address]    Culture  Setup Time   Final    GRAM POSITIVE COCCI IN CLUSTERS IN BOTH AEROBIC AND ANAEROBIC BOTTLES CRITICAL RESULT CALLED TO, READ BACK BY AND VERIFIED WITH: L POINDEXTER,PHARMD'@2250'$  04/12/21 [place] Performed at [facility], [place] [address], [place],  [address]    Culture STAPHYLOCOCCUS AUREUS (A)  Final   Report Status 04/15/2021 FINAL  Final   Organism ID, Bacteria STAPHYLOCOCCUS AUREUS  Final      Susceptibility   Staphylococcus aureus - MIC*    CIPROFLOXACIN <=0.5 SENSITIVE Sensitive     ERYTHROMYCIN <=0.25 SENSITIVE Sensitive     GENTAMICIN <=0.5 SENSITIVE Sensitive     OXACILLIN <=0.25 SENSITIVE Sensitive     TETRACYCLINE <=1 SENSITIVE Sensitive     VANCOMYCIN 1 SENSITIVE Sensitive     TRIMETH/SULFA <=10 SENSITIVE Sensitive     CLINDAMYCIN <=0.25 SENSITIVE Sensitive     RIFAMPIN <=0.5 SENSITIVE Sensitive     Inducible Clindamycin NEGATIVE Sensitive     * STAPHYLOCOCCUS AUREUS  Blood Culture ID Panel  (Reflexed)     Status: Abnormal   Collection Time: 04/12/21  4:24 AM  Result Value Ref Range Status   Enterococcus faecalis NOT DETECTED NOT DETECTED Final   Enterococcus Faecium NOT DETECTED NOT DETECTED Final   Listeria monocytogenes NOT DETECTED NOT DETECTED Final   Staphylococcus species DETECTED (A) NOT DETECTED Final    Comment: CRITICAL RESULT CALLED TO, READ BACK BY AND VERIFIED WITH: L POINDEXTER,PHARMD'@2250'$  04/12/21 [place]    Staphylococcus aureus (BCID) DETECTED (A) NOT DETECTED Final    Comment: CRITICAL RESULT CALLED TO, READ BACK BY AND VERIFIED WITH: L POINDEXTER,PHARMD'@2250'$  04/12/21 [place]    Staphylococcus epidermidis NOT DETECTED NOT DETECTED Final   Staphylococcus lugdunensis NOT DETECTED NOT DETECTED Final   Streptococcus species NOT DETECTED NOT DETECTED Final   Streptococcus agalactiae NOT DETECTED NOT DETECTED Final   Streptococcus pneumoniae NOT DETECTED NOT DETECTED Final   Streptococcus pyogenes NOT DETECTED NOT DETECTED Final   A.calcoaceticus-baumannii NOT DETECTED NOT DETECTED Final   Bacteroides fragilis NOT DETECTED NOT DETECTED Final   Enterobacterales NOT DETECTED NOT DETECTED Final   Enterobacter cloacae complex NOT DETECTED NOT DETECTED Final   Escherichia coli NOT DETECTED NOT DETECTED Final   Klebsiella aerogenes NOT DETECTED NOT DETECTED Final   Klebsiella oxytoca NOT DETECTED NOT DETECTED Final   Klebsiella pneumoniae NOT DETECTED NOT DETECTED Final   Proteus species NOT DETECTED NOT DETECTED Final   Salmonella species NOT DETECTED NOT DETECTED Final   Serratia marcescens NOT DETECTED NOT DETECTED Final   Haemophilus influenzae NOT DETECTED NOT DETECTED Final   Neisseria meningitidis NOT DETECTED NOT DETECTED Final   Pseudomonas aeruginosa NOT DETECTED NOT DETECTED Final   Stenotrophomonas maltophilia NOT DETECTED NOT DETECTED Final   Candida albicans NOT DETECTED NOT DETECTED  Final   Candida auris NOT DETECTED NOT DETECTED Final   Candida glabrata  NOT DETECTED NOT DETECTED Final   Candida krusei NOT DETECTED NOT DETECTED Final   Candida parapsilosis NOT DETECTED NOT DETECTED Final   Candida tropicalis NOT DETECTED NOT DETECTED Final   Cryptococcus neoformans/gattii NOT DETECTED NOT DETECTED Final   Meth resistant mecA/C and MREJ NOT DETECTED NOT DETECTED Final    Comment: Performed at [facility], [place] [address], [place], [place] [address]  Resp Panel by RT-PCR (Flu A&B, Covid) Nasopharyngeal Swab     Status: None   Collection Time: 04/12/21  7:05 AM   Specimen: Nasopharyngeal Swab; Nasopharyngeal(NP) swabs in vial transport medium  Result Value Ref Range Status   SARS Coronavirus 2 by RT PCR NEGATIVE NEGATIVE Final    Comment: (NOTE) SARS-CoV-2 target nucleic acids are NOT DETECTED.  The SARS-CoV-2 RNA is generally detectable in upper respiratory specimens during the acute phase of infection. The lowest concentration of SARS-CoV-2 viral copies this assay can detect is 138 copies/mL. A negative result does not preclude SARS-Cov-2 infection and should not be used as the sole basis for treatment or other patient management decisions. A negative result may occur with  improper specimen collection/handling, submission of specimen other than nasopharyngeal swab, presence of viral mutation(s) within the areas targeted by this assay, and inadequate number of viral copies(<138 copies/mL). A negative result must be combined with clinical observations, patient history, and epidemiological information. The expected result is Negative.  Fact Sheet for Patients:  EntrepreneurPulse.com.au  Fact Sheet for Healthcare Providers:  IncredibleEmployment.be  This test is no t yet approved or cleared by the [place] FDA and  has been authorized for detection and/or diagnosis of SARS-CoV-2 by FDA under an Emergency Use Authorization (EUA). This EUA will remain  in effect (meaning this test can be  used) for the duration of the COVID-19 declaration under Section 564(b)(1) of the Act, 21 U.S.C.section 360bbb-3(b)(1), unless the authorization is terminated  or revoked sooner.       Influenza A by PCR NEGATIVE NEGATIVE Final   Influenza B by PCR NEGATIVE NEGATIVE Final    Comment: (NOTE) The Xpert Xpress SARS-CoV-2/FLU/RSV plus assay is intended as an aid in the diagnosis of influenza from Nasopharyngeal swab specimens and should not be used as a sole basis for treatment. Nasal washings and aspirates are unacceptable for Xpert Xpress SARS-CoV-2/FLU/RSV testing.  Fact Sheet for Patients: EntrepreneurPulse.com.au  Fact Sheet for Healthcare Providers: IncredibleEmployment.be  This test is not yet approved or cleared by the [place] FDA and has been authorized for detection and/or diagnosis of SARS-CoV-2 by FDA under an Emergency Use Authorization (EUA). This EUA will remain in effect (meaning this test can be used) for the duration of the COVID-19 declaration under Section 564(b)(1) of the Act, 21 U.S.C. section 360bbb-3(b)(1), unless the authorization is terminated or revoked.  Performed at [facility], [place] [address] [place], [place] [address]   Respiratory (~20 pathogens) panel by PCR     Status: None   Collection Time: 04/12/21  9:59 AM   Specimen: Nasopharyngeal Swab; Respiratory  Result Value Ref Range Status   Adenovirus NOT DETECTED NOT DETECTED Final   Coronavirus 229E NOT DETECTED NOT DETECTED Final    Comment: (NOTE) The Coronavirus on the Respiratory Panel, DOES NOT test for the novel  Coronavirus (2019 nCoV)    Coronavirus HKU1 NOT DETECTED NOT DETECTED Final   Coronavirus NL63 NOT DETECTED NOT DETECTED  Final   Coronavirus OC43 NOT DETECTED NOT DETECTED Final   Metapneumovirus NOT DETECTED NOT DETECTED Final   Rhinovirus / Enterovirus NOT DETECTED NOT DETECTED Final   Influenza A NOT DETECTED NOT  DETECTED Final   Influenza B NOT DETECTED NOT DETECTED Final   Parainfluenza Virus 1 NOT DETECTED NOT DETECTED Final   Parainfluenza Virus 2 NOT DETECTED NOT DETECTED Final   Parainfluenza Virus 3 NOT DETECTED NOT DETECTED Final   Parainfluenza Virus 4 NOT DETECTED NOT DETECTED Final   Respiratory Syncytial Virus NOT DETECTED NOT DETECTED Final   Bordetella pertussis NOT DETECTED NOT DETECTED Final   Bordetella Parapertussis NOT DETECTED NOT DETECTED Final   Chlamydophila pneumoniae NOT DETECTED NOT DETECTED Final   Mycoplasma pneumoniae NOT DETECTED NOT DETECTED Final    Comment: Performed at [facility], [place] [address] [place], [place] [address]  Culture, blood (routine x 2)     Status: None (Preliminary result)   Collection Time: 04/14/21  5:37 AM   Specimen: BLOOD  Result Value Ref Range Status   Specimen Description   Final    BLOOD LEFT ANTECUBITAL Performed at [facility] [address], [place], [place] [address]    Special Requests   Final    BOTTLES DRAWN AEROBIC ONLY Blood Culture adequate volume Performed at [facility] [address], [place], [place] [address]    Culture   Final    NO GROWTH 4 DAYS Performed at [facility], [place] [address] [place], [place] [address]    Report Status PENDING  Incomplete  Culture, blood (routine x 2)     Status: None (Preliminary result)   Collection Time: 04/14/21  5:38 AM   Specimen: BLOOD  Result Value Ref Range Status   Specimen Description   Final    BLOOD BLOOD LEFT FOREARM Performed at [facility] [address], [place], [place] [address]    Special Requests   Final    BOTTLES DRAWN AEROBIC AND ANAEROBIC Blood Culture adequate volume Performed at [facility] [address], [place], [place] [address]    Culture   Final    NO GROWTH 4 DAYS Performed at [facility], [place] [address] [place],  [address]     Report Status PENDING  Incomplete    Procedures and diagnostic studies:  No results found.  Medications:    [MAR Hold] buPROPion  300 mg Oral Daily   [MAR Hold] enoxaparin (LOVENOX) injection  40 mg Subcutaneous Q24H   [MAR Hold] haloperidol  2 mg Oral Once   Or   [MAR Hold] haloperidol lactate  2 mg Intramuscular Once   [MAR Hold] insulin aspart  0-15 Units Subcutaneous TID WC   [MAR Hold] insulin aspart  0-5 Units Subcutaneous QHS   Continuous Infusions:  [MAR Hold] sodium chloride 250 mL (04/13/21 1528)   [MAR Hold]  ceFAZolin (ANCEF) IV 2 g (04/18/21 [id number])     LOS: 5 days   Geradine Girt  [facility]   How to contact the [facility] Attending or Consulting provider [place] or covering provider during after hours [place], for this patient?  Check the care team in [facility] and look for a) attending/consulting TR[facility] provider listed and b) the [facility] team listed Log into [email or web address] and use [place]'s universal password to access. If you do not have the password, please contact the hospital operator. Locate the [facility] provider you  are looking for under [facility] and page to a number that you can be directly reached. If you still have difficulty reaching the provider, please page the [facility] (Director on Call) for the Hospitalists listed on amion for assistance.  04/18/2021, 2:25 PM

## 2021-04-18 NOTE — CV Procedure (Signed)
    TRANSESOPHAGEAL ECHOCARDIOGRAM   NAME:  Michael Hines    MRN: [id number] DOB:  1945/10/22    ADMIT DATE: 04/12/2021  INDICATIONS: Bacteremia  PROCEDURE:   Informed consent was obtained prior to the procedure. The risks, benefits and alternatives for the procedure were discussed and the patient comprehended these risks.  Risks include, but are not limited to, cough, sore throat, vomiting, nausea, somnolence, esophageal and stomach trauma or perforation, bleeding, low blood pressure, aspiration, pneumonia, infection, trauma to the teeth and death.    Procedural time out performed. The oropharynx was anesthetized with topical 1% benzocaine.    Anesthesia was administered by Dr. Elgie Congo.  The patient was administered 380 mg of propofol and 0 mg of lidocaine to achieve and maintain moderate conscious sedation.  The patient's heart rate, blood pressure, and oxygen saturation are monitored continuously during the procedure. The period of conscious sedation is 22 minutes, of which I was present face-to-face 100% of this time.   The transesophageal probe was inserted in the esophagus and stomach without difficulty and multiple views were obtained.   COMPLICATIONS:    There were no immediate complications.  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 No signs of endocarditis.  Normal LV/RV function.   Full report to follow. Further management per primary team.   Michael [place] T. Audie Box, MD, [facility]  [address], [place], [place] [address] [phone]  2:29 PM

## 2021-04-18 NOTE — Progress Notes (Signed)
Echocardiogram Echocardiogram Transesophageal has been performed.  Oneal Deputy Yudith Norlander RDCS 04/18/2021, 2:47 PM

## 2021-04-18 NOTE — Progress Notes (Signed)
PHARMACY CONSULT NOTE FOR:  OUTPATIENT  PARENTERAL ANTIBIOTIC THERAPY (OPAT)  Indication: MSSA bacterermia Regimen: Ancef 2g IV q8h End date: 05/13/21  IV antibiotic discharge orders are pended. To discharging provider:  please sign these orders via discharge navigator,  Select New Orders & click on the button choice - Manage This Unsigned Work.     Thank you for allowing pharmacy to be a part of this patient's care.  Peggyann Juba, PharmD, BCPS Pharmacy: [phone] 04/18/2021, 6:55 PM

## 2021-04-18 NOTE — Anesthesia Postprocedure Evaluation (Signed)
Anesthesia Post Note  Patient: Michael Hines  Procedure(s) Performed: TRANSESOPHAGEAL ECHOCARDIOGRAM (TEE)     Patient location during evaluation: PACU Anesthesia Type: MAC Level of consciousness: awake and alert and awake Pain management: pain level controlled Vital Signs Assessment: post-procedure vital signs reviewed and stable Respiratory status: spontaneous breathing and respiratory function stable Cardiovascular status: stable Postop Assessment: no apparent nausea or vomiting Anesthetic complications: no   No notable events documented.  Last Vitals:  Vitals:   04/18/21 1304 04/18/21 1436  BP: (!) 162/92 98/84  Pulse: 86 78  Resp: (!) 25 20  Temp: 37 C (!) 36.4 C  SpO2: 97% (!) 88%    Last Pain:  Vitals:   04/18/21 1436  TempSrc: Temporal  PainSc: 0-No pain                 Merlinda Frederick

## 2021-04-18 NOTE — Anesthesia Preprocedure Evaluation (Addendum)
Anesthesia Evaluation  Patient identified by MRN, date of birth, ID band Patient awake    Reviewed: Allergy & Precautions, NPO status , Patient's Chart, lab work & pertinent test results  History of Anesthesia Complications Negative for: history of anesthetic complications  Airway Mallampati: III  TM Distance: >3 FB Neck ROM: Full    Dental  (+) Dental Advisory Given, Missing   Pulmonary COPD,  COPD inhaler, former smoker,    Pulmonary exam normal        Cardiovascular Normal cardiovascular exam+ Valvular Problems/Murmurs (s/p AVR)    Hx Thoracic AA  '22 TTE - EF 60 to 65%. Mild left ventricular hypertrophy. Grade I diastolic dysfunction (impaired relaxation). Right ventricular systolic function is mildly reduced. Trivial mitral valve regurgitation. S/p redo surgical aortic valve replacement. Calculations most consistent with patient prosthesis mismatch. EOA 1.16 cm2, iEOA 0.54, DI  0.37, AT 71 msec. Aortic valve regurgitation is trivial. 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 Aortic valve mean gradient measures 19.0 mmHg. There is mild dilatation of the ascending aorta, measuring 44 mm.      Neuro/Psych PSYCHIATRIC DISORDERS Anxiety  Neuromuscular disease (Parkinsons disease)    GI/Hepatic negative GI ROS, Neg liver ROS,   Endo/Other  diabetes, Type 2, Oral Hypoglycemic Agents Obesity   Renal/GU negative Renal ROS     Musculoskeletal negative musculoskeletal ROS (+)   Abdominal   Peds  Hematology  Plt 141k    Anesthesia Other Findings   Reproductive/Obstetrics                            Anesthesia Physical Anesthesia Plan  ASA: 3  Anesthesia Plan: MAC   Post-op Pain Management:    Induction:   PONV Risk Score and Plan: 1 and Propofol infusion and Treatment may vary due to age or medical condition  Airway Management Planned: Nasal Cannula and Natural  Airway  Additional Equipment: None  Intra-op Plan:   Post-operative Plan:   Informed Consent: I have reviewed the patients History and Physical, chart, labs and discussed the procedure including the risks, benefits and alternatives for the proposed anesthesia with the patient or authorized representative who has indicated his/her understanding and acceptance.       Plan Discussed with: CRNA and Anesthesiologist  Anesthesia Plan Comments:        Anesthesia Quick Evaluation

## 2021-04-18 NOTE — Interval H&P Note (Signed)
History and Physical Interval Note:  04/18/2021 1:36 PM  Michael Hines  has presented today for surgery, with the diagnosis of BACTERIMIA.  The various methods of treatment have been discussed with the patient and family. After consideration of risks, benefits and other options for treatment, the patient has consented to  Procedure(s): TRANSESOPHAGEAL ECHOCARDIOGRAM (TEE) (N/A) as a surgical intervention.  The patient's history has been reviewed, patient examined, no change in status, stable for surgery.  I have reviewed the patient's chart and labs.  Questions were answered to the patient's satisfaction.    NPO for TEE. Bacteremia. 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   [place] T. Audie Box, MD, [place]  [address], [place], [place] [address] [phone]  1:37 PM

## 2021-04-18 NOTE — Progress Notes (Signed)
PT Cancellation Note  Patient Details Name: RILYNN GOODWYN MRN: [id number] DOB: [birth year]   Cancelled Treatment:    Reason Eval/Treat Not Completed: Patient to go for  procedure /TEE. Will check back another day.   Claretha Cooper 04/18/2021, 8:01 AM\  Tresa Endo PT Acute Rehabilitation Services Pager [phone] Office [phone]

## 2021-04-19 ENCOUNTER — Encounter (HOSPITAL_COMMUNITY): Payer: Self-pay | Admitting: Cardiovascular Disease

## 2021-04-19 ENCOUNTER — Inpatient Hospital Stay: Payer: Self-pay

## 2021-04-19 DIAGNOSIS — R4182 Altered mental status, unspecified: Secondary | ICD-10-CM | POA: Diagnosis not present

## 2021-04-19 DIAGNOSIS — B9561 Methicillin susceptible Staphylococcus aureus infection as the cause of diseases classified elsewhere: Secondary | ICD-10-CM | POA: Diagnosis not present

## 2021-04-19 DIAGNOSIS — M545 Low back pain, unspecified: Secondary | ICD-10-CM | POA: Diagnosis not present

## 2021-04-19 DIAGNOSIS — R7881 Bacteremia: Secondary | ICD-10-CM | POA: Diagnosis not present

## 2021-04-19 LAB — GLUCOSE, CAPILLARY
Glucose-Capillary: 76 mg/dL (ref 70–99)
Glucose-Capillary: 126 mg/dL — ABNORMAL HIGH (ref 70–99)
Glucose-Capillary: 174 mg/dL — ABNORMAL HIGH (ref 70–99)
Glucose-Capillary: 101 mg/dL — ABNORMAL HIGH (ref 70–99)

## 2021-04-19 LAB — CULTURE, BLOOD (ROUTINE X 2)
Culture: NO GROWTH
Culture: NO GROWTH
Special Requests: ADEQUATE
Special Requests: ADEQUATE

## 2021-04-19 LAB — CREATININE, SERUM
Creatinine, Ser: 0.9 mg/dL (ref 0.61–1.24)
GFR, Estimated: >60 mL/min (ref >60)

## 2021-04-19 MED ORDER — SODIUM CHLORIDE 0.9% FLUSH: 10.0000 mL | INTRAVENOUS | Status: DC | PRN

## 2021-04-19 MED ORDER — IPRATROPIUM-ALBUTEROL 0.5-2.5 (3) MG/3ML IN SOLN: 3.0000 mL | Freq: Four times a day (QID) | RESPIRATORY_TRACT | Status: DC | PRN

## 2021-04-19 MED ORDER — HYDROCODONE-ACETAMINOPHEN 10-325 MG PO TABS: 1.0000 | ORAL_TABLET | Freq: Four times a day (QID) | ORAL | 0 refills | Status: DC | PRN

## 2021-04-19 MED ORDER — CEFAZOLIN IV (FOR PTA / DISCHARGE USE ONLY): 2.0000 g | Freq: Three times a day (TID) | INTRAVENOUS | 0 refills | Status: DC

## 2021-04-19 MED ORDER — CHLORHEXIDINE GLUCONATE CLOTH 2 % EX PADS
6.0000 | MEDICATED_PAD | Freq: Every day | CUTANEOUS | Status: DC
  Administered 2021-04-19: 6 via TOPICAL

## 2021-04-19 MED ORDER — INSULIN ASPART 100 UNIT/ML IJ SOLN: 0.0000 [IU] | Freq: Three times a day (TID) | INTRAMUSCULAR | 11 refills | Status: DC

## 2021-04-19 NOTE — Progress Notes (Addendum)
Report called and given to Olou, RN at facility. Pt to be discharged with RUE single lumen PICC; dressing intact. VSS. AVS printed and placed in pt folder for facility use.

## 2021-04-19 NOTE — Progress Notes (Signed)
Peripherally Inserted Central Catheter Placement  The IV Nurse has discussed with the patient and/or persons authorized to consent for the patient, the purpose of this procedure and the potential benefits and risks involved with this procedure.  The benefits include less needle sticks, lab draws from the catheter, and the patient may be discharged home with the catheter. Risks include, but not limited to, infection, bleeding, blood clot (thrombus formation), and puncture of an artery; nerve damage and irregular heartbeat and possibility to perform a PICC exchange if needed/ordered by physician.  Alternatives to this procedure were also discussed.  Bard Power PICC patient education guide, fact sheet on infection prevention and patient information card has been provided to patient /or left at bedside.    PICC Placement Documentation  PICC Single Lumen 0000000 Right Basilic 38 cm 0 cm (Active)  Indication for Insertion or Continuance of Line Prolonged intravenous therapies 04/19/21 1045  Exposed Catheter (cm) 0 cm 04/19/21 1045  Site Assessment Clean;Dry;Intact 04/19/21 1045  Line Status Flushed;Saline locked;Blood return noted 04/19/21 1045  Dressing Type Transparent 04/19/21 1045  Dressing Status Clean;Dry;Intact 04/19/21 1045  Antimicrobial disc in place? Yes 04/19/21 1045  Dressing Intervention New dressing;Other (Comment) 04/19/21 1045  Dressing Change Due 04/26/21 04/19/21 [place], [place] Sardo Albarece 04/19/2021, 10:47 AM

## 2021-04-19 NOTE — Progress Notes (Signed)
   04/19/21 1500  Assess: MEWS Score  Temp 98.8 F (37.1 C)  BP 128/80  Pulse Rate (!) 115  Resp 12  SpO2 94 %  O2 Device Room Air  Patient Activity (if Appropriate) In bed  Assess: MEWS Score  MEWS Temp 0  MEWS Systolic 0  MEWS Pulse 2  MEWS RR 1  MEWS LOC 0  MEWS Score 3  MEWS Score Color Yellow  Assess: if the MEWS score is Yellow or Red  Were vital signs taken at a resting state? Yes  Focused Assessment No change from prior assessment  Does the patient meet 2 or more of the SIRS criteria? No  MEWS guidelines implemented *See Row Information* Yes  Treat  MEWS Interventions Escalated (See documentation below)  Pain Scale 0-10  Pain Score 0  Take Vital Signs  Increase Vital Sign Frequency  Yellow: Q 2hr X 2 then Q 4hr X 2, if remains yellow, continue Q 4hrs  Escalate  MEWS: Escalate Yellow: discuss with charge nurse/RN and consider discussing with provider and RRT  Notify: Charge Nurse/RN  Name of Charge Nurse/RN Notified Hannah, RN  Date Charge Nurse/RN Notified 04/19/21  Time Charge Nurse/RN Notified 15  Notify: Provider  Provider Name/Title Eliseo Squires, MD  Date Provider Notified 04/19/21  Time Provider Notified 1524  Notification Type Page  Notification Reason Other (Comment) (Yellow MEWS)  Provider response Other (Comment) (Awaiting orders)  Document  Patient Outcome Other (Comment) (No new meds ordered. MD aware. Charge aware.)  Progress note created (see row info) Yes  Assess: SIRS CRITERIA  SIRS Temperature  0  SIRS Pulse 1  SIRS Respirations  0  SIRS WBC 0  SIRS Score Sum  1  Pt in yellow MEWS. Agricultural consultant notified. MD Eliseo Squires made aware. Yellow MEWS protocol initiated. Pt Aox4.

## 2021-04-19 NOTE — TOC Transition Note (Addendum)
Transition of Care [facility]) - CM/SW Discharge Note   Patient Details  Name: Michael Hines MRN: [id number] Date of Birth: [birth year]  [facility] CM/SW Contact:  [name] Mage, LCSW Phone Number: 04/19/2021, 1:52 PM   Clinical Narrative:   Patient who is stable for discharge will transfer to [facility] today.  Family alerted.  PTAR arranged.  Nursing, please call report to [phone], room 109.  TOC sign off.    Final next level of care: Skilled Nursing Facility Barriers to Discharge: Barriers Resolved   Patient Goals and CMS Choice     Choice offered to / list presented to : Patient, Spouse  Discharge Placement                       Discharge Plan and Services   Discharge Planning Services: CM Consult Post Acute Care Choice: [place]                               Social Determinants of Health (SDOH) Interventions     Readmission Risk Interventions No flowsheet data found.

## 2021-04-19 NOTE — Discharge Summary (Addendum)
Physician Discharge Summary  BUNYAN BRIER FWY:[id number] DOB: 1945/10/17 DOA: 04/12/2021  PCP: Finis Bud, MD  Admit date: 04/12/2021 Discharge date: 04/19/2021  Admitted From: home Discharge disposition: SNF   Recommendations for Outpatient Follow-Up:   SSI- TID with  meals Labs  weekly while on IV antibiotics: _x_ CBC with differential _x_ BMP w GFR/CMP __x CRP _x_ ESR 3.   4 weeks of ancef (from 9/9) 4.  D/c PICC upon completion of abx   Discharge Diagnosis:   Principal Problem:   MSSA bacteremia Active Problems:   Sepsis ([place])   COPD exacerbation ([place])   Acute respiratory failure with hypoxia (HCC)   S/P AVR   Parkinson disease ([place])   Diabetic polyneuropathy associated with type 2 diabetes mellitus (HCC)   Chronic bilateral low back pain without sciatica    Discharge Condition: Improved.  Diet recommendation: Low sodium, heart healthy.  Carbohydrate-modified  Wound care: None.  Code status: Full.   History of Present Illness:   Michael Hines is a 75 y.o. male with medical history significant of tobacco abuse, COPD, DM2, anxiety. Presenting with dyspnea and confusion. History is from wife at bedside. She reports that she woke him up in the middle of the night with heavy breathing. He said he was cold and he was shaking. He didn't seem like his normal self. This frightened her, so she called for EMS. She reports that he has several chronic problems that are being workup up with his PCP and specialists. He is generally chronically ill, so it is a little more difficult to tell if there has been a change in his status prior to last night. Neither has she noticed that he has used his inhaler more frequently, nor has she noticed increased cough. When EMS arrived, the patient was noted to be A&O x 2 (name/place). He was brought to the ED for evaluation.    ED Course: He was found to be tachycardic and tachypneic. CTH and CTA Chest/abdomen/pelvis  were negative for acute change. He had mild leukocytosis (11.4) and mild flatly elevated troponin (20, 20). He was hypoxic on RA. He was given fluids, cefepime, and inhalers and steroids. [facility] was called for admission.    Hospital Course by Problem:   MSSA bacteremia -Patient started on IV cefazolin- will need for 4 more weeks -ID recommends TEE, as patient has history of aortic valve replacement in 2013 -MRI lumbar spine only showed multilevel spondylosis, no discitis or abscess. - TEE negative for endocarditis   Metabolic encephalopathy/delirium -In setting of MSSA bacteremia -appears to be at baseline   Dyspnea -Resolved; likely was tachypneic from bacteremia - discontinued prednisone  -Continue DuoNeb nebulization every 6 hours as needed   Recent falls -PT evaluation obtained -Recommended to go to rehab- patient and wife agreeable   Diabetes mellitus type 2 -Continue sliding scale insulin with NovoLog -CBG well controlled   obesity Body mass index is 32.51 kg/m.      Medical Consultants:      Discharge Exam:   Vitals:   04/18/21 2017 04/19/21 0428  BP: (!) 135/94 134/81  Pulse: 86 84  Resp: 18 20  Temp: 98 F (36.7 C) 98.7 F (37.1 C)  SpO2: 94% 94%   Vitals:   04/18/21 1455 04/18/21 1545 04/18/21 2017 04/19/21 0428  BP: (!) 138/98 124/65 (!) 135/94 134/81  Pulse: 80 84 86 84  Resp: '19  18 20  ' Temp:  98.1 F (36.7 C) 98  F (36.7 C) 98.7 F (37.1 C)  TempSrc:  Oral    SpO2: 96% 95% 94% 94%  Weight:      Height:        General exam: Appears calm and comfortable.   The results of significant diagnostics from this hospitalization (including imaging, microbiology, ancillary and laboratory) are listed below for reference.     Procedures and Diagnostic Studies:   CT HEAD WO CONTRAST (5MM)  Result Date: 04/12/2021 CLINICAL DATA:  Altered level of consciousness. EXAM: CT HEAD WITHOUT CONTRAST TECHNIQUE: Contiguous axial images were obtained from  the base of the skull through the vertex without intravenous contrast. COMPARISON:  Brain MRI 02/06/2020 FINDINGS: Brain: No evidence of acute infarction, hemorrhage, hydrocephalus, extra-axial collection or mass lesion/mass effect. Central predominant cerebral volume loss with mild chronic small vessel ischemia. Vascular: No hyperdense vessel or unexpected calcification. Skull: Normal. Negative for fracture or focal lesion. Sinuses/Orbits: No acute finding. IMPRESSION: Senescent changes without acute finding. Electronically Signed   By: Monte Fantasia M.D.   On: 04/12/2021 07:15   DG Chest Port 1 View  Result Date: 04/12/2021 CLINICAL DATA:  Questionable sepsis EXAM: PORTABLE CHEST 1 VIEW COMPARISON:  12/04/2019 FINDINGS: Chronic cardiomegaly. Stable aortic and hilar contours when accounting for rotation. Prior aortic valve repair. Interstitial prominence. No focal opacity, effusion, pneumothorax, or Kerley lines IMPRESSION: 1. Vascular congestion versus hypoinflation. 2. Chronic cardiomegaly. 3. No clear pneumonia. Electronically Signed   By: Monte Fantasia M.D.   On: 04/12/2021 05:04   ECHOCARDIOGRAM COMPLETE  Result Date: 04/13/2021    ECHOCARDIOGRAM REPORT   Patient Name:   Michael Hines Date of Exam: 04/13/2021 Medical Rec #:  [id number]         Height:       69.0 in Accession #:    [id number]        Weight:       215.0 lb Date of Birth:  July 02, 1946         BSA:          2.130 m Patient Age:    15 years          BP:           133/73 mmHg Patient Gender: M                 HR:           74 bpm. Exam Location:  Inpatient Procedure: 2D Echo, Cardiac Doppler and Color Doppler                              MODIFIED REPORT: This report was modified by Cherlynn Kaiser MD on 04/13/2021 due to revision.  Indications:     Bacteremia  History:         Patient has prior history of Echocardiogram examinations, most                  recent 09/01/2018. Risk Factors:Diabetes and Dyslipidemia.                  Aortic  Valve: 21 mm pericardial valve is present in the aortic                  position. Procedure Date: Dec 2013.  Sonographer:     [place] Referring Phys:  [id number] Rosiland Oz Diagnosing Phys: Cherlynn Kaiser MD  Sonographer Comments: Patient is morbidly obese, Technically challenging study due  to limited acoustic windows, suboptimal parasternal window, suboptimal apical window and suboptimal subcostal window. Image acquisition challenging due to patient body habitus. IMPRESSIONS  1. Image quality is insufficient to exclude valvular vegetations. Consider TEE if clinically indicated.  2. Left ventricular ejection fraction, by estimation, is 60 to 65%. The left ventricle has normal function. Left ventricular endocardial border not optimally defined to evaluate regional wall motion. There is mild left ventricular hypertrophy. Left ventricular diastolic parameters are consistent with Grade I diastolic dysfunction (impaired relaxation).  3. Right ventricular systolic function is mildly reduced. The right ventricular size is normal. Tricuspid regurgitation signal is inadequate for assessing PA pressure.  4. The mitral valve is grossly normal. Trivial mitral valve regurgitation. No evidence of mitral stenosis.  5. S/p redo surgical aortic valve replacement. Calculations most consistent with patient prosthesis mismatch. EOA 1.16 cm2, iEOA 0.54, DI 0.37, AT 71 msec. . The aortic valve has been repaired/replaced. Aortic valve regurgitation is trivial. 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 Procedure Date: Dec 2013. Aortic valve mean gradient measures 19.0 mmHg.  6. Aortic dilatation noted. There is mild dilatation of the ascending aorta, measuring 44 mm.  7. The inferior vena cava is normal in size with greater than 50% respiratory variability, suggesting right atrial pressure of 3 mmHg. Comparison(s): Compared to report of echo from 6/22 at outside hospital, gradient through aortic  valve is known and stable. FINDINGS  Left Ventricle: Left ventricular ejection fraction, by estimation, is 60 to 65%. The left ventricle has normal function. Left ventricular endocardial border not optimally defined to evaluate regional wall motion. The left ventricular internal cavity size was normal in size. There is mild left ventricular hypertrophy. Left ventricular diastolic parameters are consistent with Grade I diastolic dysfunction (impaired relaxation). Right Ventricle: The right ventricular size is normal. Right vetricular wall thickness was not well visualized. Right ventricular systolic function is mildly reduced. Tricuspid regurgitation signal is inadequate for assessing PA pressure. Left Atrium: Left atrial size was normal in size. Right Atrium: Right atrial size was normal in size. Pericardium: There is no evidence of pericardial effusion. Presence of pericardial fat pad. Mitral Valve: The mitral valve is grossly normal. Trivial mitral valve regurgitation. No evidence of mitral valve stenosis. MV peak gradient, 4.2 mmHg. The mean mitral valve gradient is 2.0 mmHg. Tricuspid Valve: The tricuspid valve is grossly normal. Tricuspid valve regurgitation is trivial. Aortic Valve: S/p redo surgical aortic valve replacement. Calculations most consistent with patient prosthesis mismatch. EOA 1.16 cm2, iEOA 0.54, DI 0.37, AT 71 msec. The aortic valve has been repaired/replaced. Aortic valve regurgitation is trivial. Aortic valve mean gradient measures 19.0 mmHg. Aortic valve peak gradient measures 35.3 mmHg. Aortic valve area, by VTI measures 1.16 cm. 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 Procedure Date: Dec 2013. Pulmonic Valve: The pulmonic valve was not well visualized. Pulmonic valve regurgitation is trivial. Aorta: Aortic dilatation noted. There is mild dilatation of the ascending aorta, measuring 44 mm. Venous: The inferior vena cava is normal in size with greater than 50%  respiratory variability, suggesting right atrial pressure of 3 mmHg. IAS/Shunts: The interatrial septum was not well visualized.  LEFT VENTRICLE PLAX 2D LVIDd:         4.50 cm     Diastology LVIDs:         3.80 cm     LV e' medial:    4.90 cm/s LV PW:  1.20 cm     LV E/e' medial:  15.1 LV IVS:        1.10 cm     LV e' lateral:   6.42 cm/s LVOT diam:     2.00 cm     LV E/e' lateral: 11.5 LV SV:         57 LV SV Index:   27 LVOT Area:     3.14 cm  LV Volumes (MOD) LV vol d, MOD A2C: 52.3 ml LV vol d, MOD A4C: 57.9 ml LV vol s, MOD A2C: 18.1 ml LV vol s, MOD A4C: 25.4 ml LV SV MOD A2C:     34.2 ml LV SV MOD A4C:     57.9 ml LV SV MOD BP:      33.7 ml RIGHT VENTRICLE RV Basal diam:  3.30 cm RV Mid diam:    2.10 cm TAPSE (M-mode): 1.3 cm LEFT ATRIUM             Index       RIGHT ATRIUM           Index LA Vol (A2C):   28.3 ml 13.28 ml/m RA Area:     20.30 cm LA Vol (A4C):   52.2 ml 24.50 ml/m RA Volume:   57.00 ml  26.75 ml/m LA Biplane Vol: 39.7 ml 18.63 ml/m  AORTIC VALVE                    PULMONIC VALVE AV Area (Vmax):    1.05 cm     PV Vmax:       0.92 m/s AV Area (Vmean):   1.08 cm     PV Vmean:      64.000 cm/s AV Area (VTI):     1.16 cm     PV VTI:        0.157 m AV Vmax:           297.00 cm/s  PV Peak grad:  3.4 mmHg AV Vmean:          197.000 cm/s PV Mean grad:  2.0 mmHg AV VTI:            0.489 m AV Peak Grad:      35.3 mmHg AV Mean Grad:      19.0 mmHg LVOT Vmax:         99.50 cm/s LVOT Vmean:        67.800 cm/s LVOT VTI:          0.180 m LVOT/AV VTI ratio: 0.37  AORTA Ao Root diam: 3.80 cm Ao Asc diam:  4.40 cm MITRAL VALVE MV Area (PHT): 3.16 cm    SHUNTS MV Peak grad:  4.2 mmHg    Systemic VTI:  0.18 m MV Mean grad:  2.0 mmHg    Systemic Diam: 2.00 cm MV Vmax:       1.02 m/s MV Vmean:      56.4 cm/s MV Decel Time: 240 msec MV E velocity: 73.80 cm/s MV A velocity: 98.50 cm/s MV E/A ratio:  0.75 Cherlynn Kaiser MD Electronically signed by Cherlynn Kaiser MD Signature Date/Time:  04/13/2021/1:24:22 PM    Final (Updated)    CT Angio Chest/Abd/Pel for Dissection W and/or Wo Contrast  Result Date: 04/12/2021 CLINICAL DATA:  Abdominal pain with aortic dissection suspected. Question graft leak or infection EXAM: CT ANGIOGRAPHY CHEST, ABDOMEN AND PELVIS TECHNIQUE: Non-contrast CT of the chest was initially obtained. Multidetector CT imaging through the chest, abdomen  and pelvis was performed using the standard protocol during bolus administration of intravenous contrast. Multiplanar reconstructed images and MIPs were obtained and reviewed to evaluate the vascular anatomy. CONTRAST:  142m OMNIPAQUE IOHEXOL 350 MG/ML SOLN COMPARISON:  Chest CTA 12/05/2019 FINDINGS: CTA CHEST FINDINGS Cardiovascular: Preferential opacification of the thoracic aorta. No evidence of intramural hematoma or dissection. Normal heart size. No pericardial effusion. Aortic valve repair/aortic valve replacement. Ascending aortic diameter of 4.3 cm, unchanged. Mediastinum/Nodes: No hematoma or pneumomediastinum. Lungs/Pleura: Dependent atelectasis. There is no edema, consolidation, effusion, or pneumothorax. Musculoskeletal: Remote anterior rib fractures. Review of the MIP images confirms the above findings. CTA ABDOMEN AND PELVIS FINDINGS VASCULAR Aorta: Diffuse atheromatous plaque.  No aneurysm or dissection. Celiac: Plaque at the ostium without flow limiting stenosis. SMA: Plaque at the ostium without flow limiting stenosis on reformats. No branch occlusion or dissection seen. Renals: Accessory right upper pole renal artery. Atheromatous plaque at the ostia with up to 50% narrowing at the left ostium. IMA: Patent Inflow: Diffuse atheromatous plaque. Mixed density plaque at the right common iliac bifurcation with at least moderate stenosis. Veins: Negative Review of the MIP images confirms the above findings. NON-VASCULAR Hepatobiliary: No focal liver abnormality.Cholecystectomy. No bile duct dilatation. Pancreas:  Unremarkable. Spleen: Unremarkable. Adrenals/Urinary Tract: Negative adrenals. No hydronephrosis or ureteral stone. Small left upper and lower pole renal calculi. Unremarkable bladder. Stomach/Bowel: No obstruction. No appendicitis. Colonic diverticulosis. Lymphatic: No mass or adenopathy. Reproductive:No pathologic findings. Other: Postoperative left groin.  Fatty left inguinal hernia. Musculoskeletal: No acute abnormalities. Lumbar spine degeneration especially affecting facets inferiorly. Review of the MIP images confirms the above findings. IMPRESSION: 1. No acute finding.  No evidence of acute aortic syndrome. 2. Aortic valve replacement. 3. 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 No progression since 2021. 4. Incidental findings are described above. Electronically Signed   By: JMonte FantasiaM.D.   On: 04/12/2021 07:25     Labs:   Basic Metabolic Panel: Recent Labs  Lab 04/13/21 0556 04/14/21 0537 04/17/21 0501 04/18/21 0447 04/19/21 0456  NA 138 136 139 136  --   K 3.6 3.6 3.4* 3.6  --   CL 105 100 106 101  --   CO2 '26 25 25 26  ' --   GLUCOSE 195* 103* 98 105*  --   BUN 19 24* 16 14  --   CREATININE 0.79 1.00 0.68 0.77 0.90  CALCIUM 8.8* 8.7* 8.6* 8.3*  --    GFR Estimated Creatinine Clearance: 79.9 mL/min (by C-G formula based on SCr of 0.9 mg/dL). Liver Function Tests: Recent Labs  Lab 04/13/21 0556 04/14/21 0537  AST 18 29  ALT 22 28  ALKPHOS 48 49  BILITOT 0.6 0.7  PROT 5.8* 6.3*  ALBUMIN 2.9* 3.0*   No results for input(s): LIPASE, AMYLASE in the last 168 hours. No results for input(s): AMMONIA in the last 168 hours. Coagulation profile No results for input(s): INR, PROTIME in the last 168 hours.  CBC: Recent Labs  Lab 04/13/21 0556 04/14/21 0537 04/17/21 0501  WBC 10.7* 6.0 8.2  HGB 13.6 15.1 14.9  HCT 40.3 44.7 43.7  MCV 92.2 92.5 91.6  PLT 151 122* 141*   Cardiac Enzymes: No results for input(s): CKTOTAL, CKMB, CKMBINDEX,  TROPONINI in the last 168 hours. BNP: Invalid input(s): POCBNP CBG: Recent Labs  Lab 04/18/21 1141 04/18/21 1722 04/18/21 2019 04/19/21 0803 04/19/21 1117  GLUCAP 89 127* 93 76 126*   D-Dimer No results for input(s): DDIMER  in the last 72 hours. Hgb A1c No results for input(s): HGBA1C in the last 72 hours. Lipid Profile No results for input(s): CHOL, HDL, LDLCALC, TRIG, CHOLHDL, LDLDIRECT in the last 72 hours. Thyroid function studies No results for input(s): TSH, T4TOTAL, T3FREE, THYROIDAB in the last 72 hours.  Invalid input(s): FREET3 Anemia work up No results for input(s): VITAMINB12, FOLATE, FERRITIN, TIBC, IRON, RETICCTPCT in the last 72 hours. Microbiology Recent Results (from the past 240 hour(s))  Blood culture (routine x 2)     Status: Abnormal   Collection Time: 04/12/21  4:23 AM   Specimen: BLOOD  Result Value Ref Range Status   Specimen Description   Final    BLOOD BLOOD LEFT HAND Performed at [facility] [address], [place], [place] [address]    Special Requests   Final    BOTTLES DRAWN AEROBIC AND ANAEROBIC Blood Culture adequate volume Performed at [facility] [address], [place], [place] [address]    Culture  Setup Time   Final    GRAM POSITIVE COCCI IN CLUSTERS IN BOTH AEROBIC AND ANAEROBIC BOTTLES CRITICAL VALUE NOTED.  VALUE IS CONSISTENT WITH PREVIOUSLY REPORTED AND CALLED VALUE.    Culture (A)  Final    STAPHYLOCOCCUS AUREUS SUSCEPTIBILITIES PERFORMED ON PREVIOUS CULTURE WITHIN THE LAST 5 DAYS. Performed at [facility], [place] [address] [place], [place] [address]    Report Status 04/15/2021 FINAL  Final  Blood culture (routine x 2)     Status: Abnormal   Collection Time: 04/12/21  4:24 AM   Specimen: Right Antecubital; Blood  Result Value Ref Range Status   Specimen Description   Final    RIGHT ANTECUBITAL Performed at [facility] [address], [place],  [place] [address]    Special Requests   Final    BOTTLES DRAWN AEROBIC AND ANAEROBIC Blood Culture results may not be optimal due to an inadequate volume of blood received in culture bottles Performed at [facility] [address], [place], [place] [address]    Culture  Setup Time   Final    GRAM POSITIVE COCCI IN CLUSTERS IN BOTH AEROBIC AND ANAEROBIC BOTTLES CRITICAL RESULT CALLED TO, READ BACK BY AND VERIFIED WITH: L POINDEXTER,PHARMD'@2250'  04/12/21 [place] Performed at [facility], [place] [address], [place], [place] [address]    Culture STAPHYLOCOCCUS AUREUS (A)  Final   Report Status 04/15/2021 FINAL  Final   Organism ID, Bacteria STAPHYLOCOCCUS AUREUS  Final      Susceptibility   Staphylococcus aureus - MIC*    CIPROFLOXACIN <=0.5 SENSITIVE Sensitive     ERYTHROMYCIN <=0.25 SENSITIVE Sensitive     GENTAMICIN <=0.5 SENSITIVE Sensitive     OXACILLIN <=0.25 SENSITIVE Sensitive     TETRACYCLINE <=1 SENSITIVE Sensitive     VANCOMYCIN 1 SENSITIVE Sensitive     TRIMETH/SULFA <=10 SENSITIVE Sensitive     CLINDAMYCIN <=0.25 SENSITIVE Sensitive     RIFAMPIN <=0.5 SENSITIVE Sensitive     Inducible Clindamycin NEGATIVE Sensitive     * STAPHYLOCOCCUS AUREUS  Blood Culture ID Panel (Reflexed)     Status: Abnormal   Collection Time: 04/12/21  4:24 AM  Result Value Ref Range Status   Enterococcus faecalis NOT DETECTED NOT DETECTED Final   Enterococcus Faecium NOT DETECTED NOT DETECTED Final   Listeria monocytogenes NOT DETECTED NOT DETECTED Final   Staphylococcus species DETECTED (A) NOT DETECTED Final    Comment: CRITICAL RESULT CALLED  TO, READ BACK BY AND VERIFIED WITH: L POINDEXTER,PHARMD'@2250'  04/12/21 [place]    Staphylococcus aureus (BCID) DETECTED (A) NOT DETECTED Final    Comment: CRITICAL RESULT CALLED TO, READ BACK BY AND VERIFIED WITH: L POINDEXTER,PHARMD'@2250'  04/12/21 [place]    Staphylococcus epidermidis NOT DETECTED NOT DETECTED Final   Staphylococcus lugdunensis NOT  DETECTED NOT DETECTED Final   Streptococcus species NOT DETECTED NOT DETECTED Final   Streptococcus agalactiae NOT DETECTED NOT DETECTED Final   Streptococcus pneumoniae NOT DETECTED NOT DETECTED Final   Streptococcus pyogenes NOT DETECTED NOT DETECTED Final   A.calcoaceticus-baumannii NOT DETECTED NOT DETECTED Final   Bacteroides fragilis NOT DETECTED NOT DETECTED Final   Enterobacterales NOT DETECTED NOT DETECTED Final   Enterobacter cloacae complex NOT DETECTED NOT DETECTED Final   Escherichia coli NOT DETECTED NOT DETECTED Final   Klebsiella aerogenes NOT DETECTED NOT DETECTED Final   Klebsiella oxytoca NOT DETECTED NOT DETECTED Final   Klebsiella pneumoniae NOT DETECTED NOT DETECTED Final   Proteus species NOT DETECTED NOT DETECTED Final   Salmonella species NOT DETECTED NOT DETECTED Final   Serratia marcescens NOT DETECTED NOT DETECTED Final   Haemophilus influenzae NOT DETECTED NOT DETECTED Final   Neisseria meningitidis NOT DETECTED NOT DETECTED Final   Pseudomonas aeruginosa NOT DETECTED NOT DETECTED Final   Stenotrophomonas maltophilia NOT DETECTED NOT DETECTED Final   Candida albicans NOT DETECTED NOT DETECTED Final   Candida auris NOT DETECTED NOT DETECTED Final   Candida glabrata NOT DETECTED NOT DETECTED Final   Candida krusei NOT DETECTED NOT DETECTED Final   Candida parapsilosis NOT DETECTED NOT DETECTED Final   Candida tropicalis NOT DETECTED NOT DETECTED Final   Cryptococcus neoformans/gattii NOT DETECTED NOT DETECTED Final   Meth resistant mecA/C and MREJ NOT DETECTED NOT DETECTED Final    Comment: Performed at [facility], [address] [place], [place] [address]  Resp Panel by RT-PCR (Flu A&B, Covid) Nasopharyngeal Swab     Status: None   Collection Time: 04/12/21  7:05 AM   Specimen: Nasopharyngeal Swab; Nasopharyngeal(NP) swabs in vial transport medium  Result Value Ref Range Status   SARS Coronavirus 2 by RT PCR NEGATIVE NEGATIVE Final    Comment:  (NOTE) SARS-CoV-2 target nucleic acids are NOT DETECTED.  The SARS-CoV-2 RNA is generally detectable in upper respiratory specimens during the acute phase of infection. The lowest concentration of SARS-CoV-2 viral copies this assay can detect is 138 copies/mL. A negative result does not preclude SARS-Cov-2 infection and should not be used as the sole basis for treatment or other patient management decisions. A negative result may occur with  improper specimen collection/handling, submission of specimen other than nasopharyngeal swab, presence of viral mutation(s) within the areas targeted by this assay, and inadequate number of viral copies(<138 copies/mL). A negative result must be combined with clinical observations, patient history, and epidemiological information. The expected result is Negative.  Fact Sheet for Patients:  EntrepreneurPulse.com.au  Fact Sheet for Healthcare Providers:  IncredibleEmployment.be  This test is no t yet approved or cleared by the [place] FDA and  has been authorized for detection and/or diagnosis of SARS-CoV-2 by FDA under an Emergency Use Authorization (EUA). This EUA will remain  in effect (meaning this test can be used) for the duration of the COVID-19 declaration under Section 564(b)(1) of the Act, 21 U.S.C.section 360bbb-3(b)(1), unless the authorization is terminated  or revoked sooner.       Influenza A by PCR NEGATIVE NEGATIVE Final   Influenza B  by PCR NEGATIVE NEGATIVE Final    Comment: (NOTE) The Xpert Xpress SARS-CoV-2/FLU/RSV plus assay is intended as an aid in the diagnosis of influenza from Nasopharyngeal swab specimens and should not be used as a sole basis for treatment. Nasal washings and aspirates are unacceptable for Xpert Xpress SARS-CoV-2/FLU/RSV testing.  Fact Sheet for Patients: EntrepreneurPulse.com.au  Fact Sheet for Healthcare  Providers: IncredibleEmployment.be  This test is not yet approved or cleared by the [place] FDA and has been authorized for detection and/or diagnosis of SARS-CoV-2 by FDA under an Emergency Use Authorization (EUA). This EUA will remain in effect (meaning this test can be used) for the duration of the COVID-19 declaration under Section 564(b)(1) of the Act, 21 U.S.C. section 360bbb-3(b)(1), unless the authorization is terminated or revoked.  Performed at [facility], [place] [address], [place], [place] [address]   Respiratory (~20 pathogens) panel by PCR     Status: None   Collection Time: 04/12/21  9:59 AM   Specimen: Nasopharyngeal Swab; Respiratory  Result Value Ref Range Status   Adenovirus NOT DETECTED NOT DETECTED Final   Coronavirus 229E NOT DETECTED NOT DETECTED Final    Comment: (NOTE) The Coronavirus on the Respiratory Panel, DOES NOT test for the novel  Coronavirus (2019 nCoV)    Coronavirus HKU1 NOT DETECTED NOT DETECTED Final   Coronavirus NL63 NOT DETECTED NOT DETECTED Final   Coronavirus OC43 NOT DETECTED NOT DETECTED Final   Metapneumovirus NOT DETECTED NOT DETECTED Final   Rhinovirus / Enterovirus NOT DETECTED NOT DETECTED Final   Influenza A NOT DETECTED NOT DETECTED Final   Influenza B NOT DETECTED NOT DETECTED Final   Parainfluenza Virus 1 NOT DETECTED NOT DETECTED Final   Parainfluenza Virus 2 NOT DETECTED NOT DETECTED Final   Parainfluenza Virus 3 NOT DETECTED NOT DETECTED Final   Parainfluenza Virus 4 NOT DETECTED NOT DETECTED Final   Respiratory Syncytial Virus NOT DETECTED NOT DETECTED Final   Bordetella pertussis NOT DETECTED NOT DETECTED Final   Bordetella Parapertussis NOT DETECTED NOT DETECTED Final   Chlamydophila pneumoniae NOT DETECTED NOT DETECTED Final   Mycoplasma pneumoniae NOT DETECTED NOT DETECTED Final    Comment: Performed at [facility], [place]. [address] [place], [place] [address]   Culture, blood (routine x 2)     Status: None   Collection Time: 04/14/21  5:37 AM   Specimen: BLOOD  Result Value Ref Range Status   Specimen Description   Final    BLOOD LEFT ANTECUBITAL Performed at [place] [address] [place], [place] [address]    Special Requests   Final    BOTTLES DRAWN AEROBIC ONLY Blood Culture adequate volume Performed at [place] [address] [place], [place] [address]    Culture   Final    NO GROWTH 5 DAYS Performed at [facility], [place] [address], [place], [place] [address]    Report Status 04/19/2021 FINAL  Final  Culture, blood (routine x 2)     Status: None   Collection Time: 04/14/21  5:38 AM   Specimen: BLOOD  Result Value Ref Range Status   Specimen Description   Final    BLOOD BLOOD LEFT FOREARM Performed at [place] [address], [place], [place] [address]    Special Requests   Final    BOTTLES DRAWN AEROBIC AND ANAEROBIC Blood Culture adequate volume Performed at [place] [address] [place],  [place] [address]    Culture   Final    NO GROWTH 5 DAYS Performed at [facility], [place] [address] [place], [place] [address]    Report Status 04/19/2021 FINAL  Final  SARS CORONAVIRUS 2 (TAT 6-24 HRS) Nasopharyngeal Nasopharyngeal Swab     Status: None   Collection Time: 04/18/21 11:10 AM   Specimen: Nasopharyngeal Swab  Result Value Ref Range Status   SARS Coronavirus 2 NEGATIVE NEGATIVE Final    Comment: (NOTE) SARS-CoV-2 target nucleic acids are NOT DETECTED.  The SARS-CoV-2 RNA is generally detectable in upper and lower respiratory specimens during the acute phase of infection. Negative results do not preclude SARS-CoV-2 infection, do not rule out co-infections with other pathogens, and should not be used as the sole basis for treatment or other patient management decisions. Negative results must be combined with  clinical observations, patient history, and epidemiological information. The expected result is Negative.  Fact Sheet for Patients: SugarRoll.be  Fact Sheet for Healthcare Providers: https://www.Hines-mathews.com/  This test is not yet approved or cleared by the [place] FDA and  has been authorized for detection and/or diagnosis of SARS-CoV-2 by FDA under an Emergency Use Authorization (EUA). This EUA will remain  in effect (meaning this test can be used) for the duration of the COVID-19 declaration under Se ction 564(b)(1) of the Act, 21 U.S.C. section 360bbb-3(b)(1), unless the authorization is terminated or revoked sooner.  Performed at [facility], [place] [address] [place], [place] [address]      Discharge Instructions:   Discharge Instructions     Change dressing on IV access line weekly and PRN   Complete by: As directed    Diet - low sodium heart healthy   Complete by: As directed    Diet Carb Modified   Complete by: As directed    Flush IV access with Sodium Chloride 0.9% and Heparin 10 units/ml or 100 units/ml   Complete by: As directed    Increase activity slowly   Complete by: As directed       Allergies as of 04/19/2021   No Known Allergies      Medication List     STOP taking these medications    aspirin EC 81 MG tablet   atorvastatin 20 MG tablet Commonly known as: LIPITOR   citalopram 20 MG tablet Commonly known as: CELEXA   clotrimazole-betamethasone cream Commonly known as: LOTRISONE   gabapentin 300 MG capsule Commonly known as: NEURONTIN   pioglitazone 15 MG tablet Commonly known as: ACTOS   testosterone cypionate 200 MG/ML injection Commonly known as: DEPOTESTOSTERONE CYPIONATE       TAKE these medications    budesonide-formoterol 80-4.5 MCG/ACT inhaler Commonly known as: SYMBICORT Inhale 2 puffs into the lungs 2 (two) times daily.   buPROPion 300 MG 24 hr  tablet Commonly known as: WELLBUTRIN XL Take 300 mg by mouth daily.   carbidopa-levodopa 25-100 MG tablet Commonly known as: SINEMET IR Take 1 tablet by mouth 2 (two) times daily.   ceFAZolin  IVPB Commonly known as: ANCEF Inject 2 g into the vein every 8 (eight) hours. Indication:  MSSA bacteremia First Dose: Yes Last Day of Therapy:  05/13/21 Labs - Once weekly:  CBC/D and BMP, Labs - Every other week:  ESR and CRP Method of administration: IV Push Method of administration may be changed at the discretion of home infusion pharmacist based upon assessment of the patient and/or caregiver's ability to self-administer the medication  ordered.   cyanocobalamin 1000 MCG/ML injection Commonly known as: (VITAMIN B-12) Inject 1,000 mcg into the muscle every 30 (thirty) days.   HYDROcodone-acetaminophen 10-325 MG tablet Commonly known as: NORCO Take 1 tablet by mouth every 6 (six) hours as needed. What changed: reasons to take this   insulin aspart 100 UNIT/ML injection Commonly known as: novoLOG Inject 0-15 Units into the skin 3 (three) times daily with meals.   ipratropium-albuterol 0.5-2.5 (3) MG/3ML Soln Commonly known as: DUONEB Take 3 mLs by nebulization every 6 (six) hours as needed.   loperamide 2 MG capsule Commonly known as: IMODIUM Take 1 capsule (2 mg total) by mouth as needed for diarrhea or loose stools.               Discharge Care Instructions  (From admission, onward)           Start     Ordered   04/19/21 0000  Change dressing on IV access line weekly and PRN  (Home infusion instructions - [facility] )        04/19/21 1201            Contact information for after-discharge care     Destination     [facility] Preferred SNF .   Service: Skilled Nursing Contact information: [address] [place] [phone]                      Time coordinating discharge: 35  min  Signed:  Geradine Girt DO  [facility] 04/19/2021, 12:02 PM

## 2021-04-19 NOTE — Progress Notes (Signed)
PT Cancellation Note  Patient Details Name: Michael Hines MRN: [id number] DOB: 1945/11/04   Cancelled Treatment:    Reason Eval/Treat Not Completed: Patient at procedure or test/unavailable. Vascular team at bedside placing PIC line. Will continue PT treatment attempts.     Talbot Grumbling PT, DPT 04/19/21, 10:31 AM

## 2021-04-19 NOTE — Progress Notes (Signed)
Subjective:  Michael Hines is very happy that his not have endocarditis   Antibiotics:  Anti-infectives (From admission, onward)    Start     Dose/Rate Route Frequency Ordered Stop   04/19/21 0000  ceFAZolin (ANCEF) IVPB        2 g Intravenous Every 8 hours 04/19/21 1201     04/13/21 2200  ceFAZolin (ANCEF) IVPB 2g/100 mL premix        2 g 200 mL/hr over 30 Minutes Intravenous Every 8 hours 04/13/21 1452     04/13/21 1500  ceFAZolin (ANCEF) IVPB 2g/100 mL premix        2 g 200 mL/hr over 30 Minutes Intravenous STAT 04/13/21 1452 04/14/21 1146   04/12/21 1000  cefTRIAXone (ROCEPHIN) 1 g in sodium chloride 0.9 % 100 mL IVPB  Status:  Discontinued        1 g 200 mL/hr over 30 Minutes Intravenous Every 24 hours 04/12/21 0958 04/13/21 1452   04/12/21 0445  ceFEPIme (MAXIPIME) 2 g in sodium chloride 0.9 % 100 mL IVPB        2 g 200 mL/hr over 30 Minutes Intravenous  Once 04/12/21 0436 04/12/21 0520       Medications: Scheduled Meds:  buPROPion  300 mg Oral Daily   Chlorhexidine Gluconate Cloth  6 each Topical Daily   enoxaparin (LOVENOX) injection  40 mg Subcutaneous Q24H   haloperidol  2 mg Oral Once   Or   haloperidol lactate  2 mg Intramuscular Once   insulin aspart  0-15 Units Subcutaneous TID WC   insulin aspart  0-5 Units Subcutaneous QHS   Continuous Infusions:  sodium chloride 250 mL (04/13/21 1528)   sodium chloride      ceFAZolin (ANCEF) IV 2 g (04/19/21 1611)   PRN Meds:.sodium chloride, acetaminophen **OR** acetaminophen, alum & mag hydroxide-simeth, HYDROcodone-acetaminophen, ipratropium-albuterol, ondansetron **OR** ondansetron (ZOFRAN) IV, senna-docusate, sodium chloride flush    Objective: Weight change:   Intake/Output Summary (Last 24 hours) at 04/19/2021 1641 Last data filed at 04/19/2021 0807 Gross per 24 hour  Intake 180 ml  Output 300 ml  Net -120 ml    Blood pressure 128/80, pulse (!) 115, temperature 98.8 F (37.1 C), temperature source  Oral, resp. rate 12, height '5\' 7"'  (1.702 m), weight 94.2 kg, SpO2 94 %. Temp:  [98 F (36.7 C)-98.8 F (37.1 C)] 98.8 F (37.1 C) (09/09 1500) Pulse Rate:  [84-115] 115 (09/09 1500) Resp:  [12-20] 12 (09/09 1500) BP: (128-135)/(80-94) 128/80 (09/09 1500) SpO2:  [94 %] 94 % (09/09 1500)  Physical Exam: Physical Exam Vitals and nursing note reviewed.  Constitutional:      Appearance: Michael Hines is well-developed.  HENT:     Head: Normocephalic and atraumatic.  Eyes:     Conjunctiva/sclera: Conjunctivae normal.  Cardiovascular:     Rate and Rhythm: Normal rate and regular rhythm.     Heart sounds: Murmur heard.  Pulmonary:     Effort: Pulmonary effort is normal. No respiratory distress.     Breath sounds: Normal breath sounds. No stridor. No wheezing.  Abdominal:     General: There is no distension.     Palpations: Abdomen is soft.  Musculoskeletal:        General: Normal range of motion.     Cervical back: Normal range of motion and neck supple.  Skin:    General: Skin is warm and dry.     Findings: No erythema or rash.  Neurological:     General: No focal deficit present.     Mental Status: Michael Hines is alert and oriented to person, place, and time.  Psychiatric:        Mood and Affect: Mood normal.        Behavior: Behavior normal.        Thought Content: Thought content normal.        Judgment: Judgment normal.     No diabetic foot ulcers  Multiple bruises and scars 04/16/2021:      CBC:    BMET Recent Labs    04/17/21 0501 04/18/21 0447 04/19/21 0456  NA 139 136  --   K 3.4* 3.6  --   CL 106 101  --   CO2 25 26  --   GLUCOSE 98 105*  --   BUN 16 14  --   CREATININE 0.68 0.77 0.90  CALCIUM 8.6* 8.3*  --       Liver Panel  No results for input(s): PROT, ALBUMIN, AST, ALT, ALKPHOS, BILITOT, BILIDIR, IBILI in the last 72 hours.      Sedimentation Rate No results for input(s): ESRSEDRATE in the last 72 hours. C-Reactive Protein No results for input(s):  CRP in the last 72 hours.  Micro Results: Recent Results (from the past 720 hour(s))  Blood culture (routine x 2)     Status: Abnormal   Collection Time: 04/12/21  4:23 AM   Specimen: BLOOD  Result Value Ref Range Status   Specimen Description   Final    BLOOD BLOOD LEFT HAND Performed at [facility] [address], [place], [place] [address]    Special Requests   Final    BOTTLES DRAWN AEROBIC AND ANAEROBIC Blood Culture adequate volume Performed at [facility] [address], [place], [place] [address]    Culture  Setup Time   Final    GRAM POSITIVE COCCI IN CLUSTERS IN BOTH AEROBIC AND ANAEROBIC BOTTLES CRITICAL VALUE NOTED.  VALUE IS CONSISTENT WITH PREVIOUSLY REPORTED AND CALLED VALUE.    Culture (A)  Final    STAPHYLOCOCCUS AUREUS SUSCEPTIBILITIES PERFORMED ON PREVIOUS CULTURE WITHIN THE LAST 5 DAYS. Performed at [facility], [place] [address], [place], [place] [address]    Report Status 04/15/2021 FINAL  Final  Blood culture (routine x 2)     Status: Abnormal   Collection Time: 04/12/21  4:24 AM   Specimen: Right Antecubital; Blood  Result Value Ref Range Status   Specimen Description   Final    RIGHT ANTECUBITAL Performed at [facility] [address], [place], [place] [address]    Special Requests   Final    BOTTLES DRAWN AEROBIC AND ANAEROBIC Blood Culture results may not be optimal due to an inadequate volume of blood received in culture bottles Performed at [facility] [address], [place], [place] [address]    Culture  Setup Time   Final    GRAM POSITIVE COCCI IN CLUSTERS IN BOTH AEROBIC AND ANAEROBIC BOTTLES CRITICAL RESULT CALLED TO, READ BACK BY AND VERIFIED WITH: L POINDEXTER,PHARMD'@2250'  04/12/21 [place] Performed at [facility], [place] [address], [place], [place] [address]    Culture STAPHYLOCOCCUS AUREUS (A)  Final   Report Status 04/15/2021 FINAL  Final    Organism ID, Bacteria STAPHYLOCOCCUS AUREUS  Final      Susceptibility   Staphylococcus aureus - MIC*    CIPROFLOXACIN <=0.5 SENSITIVE Sensitive  ERYTHROMYCIN <=0.25 SENSITIVE Sensitive     GENTAMICIN <=0.5 SENSITIVE Sensitive     OXACILLIN <=0.25 SENSITIVE Sensitive     TETRACYCLINE <=1 SENSITIVE Sensitive     VANCOMYCIN 1 SENSITIVE Sensitive     TRIMETH/SULFA <=10 SENSITIVE Sensitive     CLINDAMYCIN <=0.25 SENSITIVE Sensitive     RIFAMPIN <=0.5 SENSITIVE Sensitive     Inducible Clindamycin NEGATIVE Sensitive     * STAPHYLOCOCCUS AUREUS  Blood Culture ID Panel (Reflexed)     Status: Abnormal   Collection Time: 04/12/21  4:24 AM  Result Value Ref Range Status   Enterococcus faecalis NOT DETECTED NOT DETECTED Final   Enterococcus Faecium NOT DETECTED NOT DETECTED Final   Listeria monocytogenes NOT DETECTED NOT DETECTED Final   Staphylococcus species DETECTED (A) NOT DETECTED Final    Comment: CRITICAL RESULT CALLED TO, READ BACK BY AND VERIFIED WITH: L POINDEXTER,PHARMD'@2250'  04/12/21 [place]    Staphylococcus aureus (BCID) DETECTED (A) NOT DETECTED Final    Comment: CRITICAL RESULT CALLED TO, READ BACK BY AND VERIFIED WITH: L POINDEXTER,PHARMD'@2250'  04/12/21 [place]    Staphylococcus epidermidis NOT DETECTED NOT DETECTED Final   Staphylococcus lugdunensis NOT DETECTED NOT DETECTED Final   Streptococcus species NOT DETECTED NOT DETECTED Final   Streptococcus agalactiae NOT DETECTED NOT DETECTED Final   Streptococcus pneumoniae NOT DETECTED NOT DETECTED Final   Streptococcus pyogenes NOT DETECTED NOT DETECTED Final   A.calcoaceticus-baumannii NOT DETECTED NOT DETECTED Final   Bacteroides fragilis NOT DETECTED NOT DETECTED Final   Enterobacterales NOT DETECTED NOT DETECTED Final   Enterobacter cloacae complex NOT DETECTED NOT DETECTED Final   Escherichia coli NOT DETECTED NOT DETECTED Final   Klebsiella aerogenes NOT DETECTED NOT DETECTED Final   Klebsiella oxytoca NOT DETECTED NOT  DETECTED Final   Klebsiella pneumoniae NOT DETECTED NOT DETECTED Final   Proteus species NOT DETECTED NOT DETECTED Final   Salmonella species NOT DETECTED NOT DETECTED Final   Serratia marcescens NOT DETECTED NOT DETECTED Final   Haemophilus influenzae NOT DETECTED NOT DETECTED Final   Neisseria meningitidis NOT DETECTED NOT DETECTED Final   Pseudomonas aeruginosa NOT DETECTED NOT DETECTED Final   Stenotrophomonas maltophilia NOT DETECTED NOT DETECTED Final   Candida albicans NOT DETECTED NOT DETECTED Final   Candida auris NOT DETECTED NOT DETECTED Final   Candida glabrata NOT DETECTED NOT DETECTED Final   Candida krusei NOT DETECTED NOT DETECTED Final   Candida parapsilosis NOT DETECTED NOT DETECTED Final   Candida tropicalis NOT DETECTED NOT DETECTED Final   Cryptococcus neoformans/gattii NOT DETECTED NOT DETECTED Final   Meth resistant mecA/C and MREJ NOT DETECTED NOT DETECTED Final    Comment: Performed at [facility], [address], [place], [place] [address]  Resp Panel by RT-PCR (Flu A&B, Covid) Nasopharyngeal Swab     Status: None   Collection Time: 04/12/21  7:05 AM   Specimen: Nasopharyngeal Swab; Nasopharyngeal(NP) swabs in vial transport medium  Result Value Ref Range Status   SARS Coronavirus 2 by RT PCR NEGATIVE NEGATIVE Final    Comment: (NOTE) SARS-CoV-2 target nucleic acids are NOT DETECTED.  The SARS-CoV-2 RNA is generally detectable in upper respiratory specimens during the acute phase of infection. The lowest concentration of SARS-CoV-2 viral copies this assay can detect is 138 copies/mL. A negative result does not preclude SARS-Cov-2 infection and should not be used as the sole basis for treatment or other patient management decisions. A negative result may occur with  improper specimen collection/handling, submission of specimen other than  nasopharyngeal swab, presence of viral mutation(s) within the areas targeted by this assay, and inadequate number  of viral copies(<138 copies/mL). A negative result must be combined with clinical observations, patient history, and epidemiological information. The expected result is Negative.  Fact Sheet for Patients:  EntrepreneurPulse.com.au  Fact Sheet for Healthcare Providers:  IncredibleEmployment.be  This test is no t yet approved or cleared by the [place] FDA and  has been authorized for detection and/or diagnosis of SARS-CoV-2 by FDA under an Emergency Use Authorization (EUA). This EUA will remain  in effect (meaning this test can be used) for the duration of the COVID-19 declaration under Section 564(b)(1) of the Act, 21 U.S.C.section 360bbb-3(b)(1), unless the authorization is terminated  or revoked sooner.       Influenza A by PCR NEGATIVE NEGATIVE Final   Influenza B by PCR NEGATIVE NEGATIVE Final    Comment: (NOTE) The Xpert Xpress SARS-CoV-2/FLU/RSV plus assay is intended as an aid in the diagnosis of influenza from Nasopharyngeal swab specimens and should not be used as a sole basis for treatment. Nasal washings and aspirates are unacceptable for Xpert Xpress SARS-CoV-2/FLU/RSV testing.  Fact Sheet for Patients: EntrepreneurPulse.com.au  Fact Sheet for Healthcare Providers: IncredibleEmployment.be  This test is not yet approved or cleared by the [place] FDA and has been authorized for detection and/or diagnosis of SARS-CoV-2 by FDA under an Emergency Use Authorization (EUA). This EUA will remain in effect (meaning this test can be used) for the duration of the COVID-19 declaration under Section 564(b)(1) of the Act, 21 U.S.C. section 360bbb-3(b)(1), unless the authorization is terminated or revoked.  Performed at [facility], [place] [address], [place], [place] [address]   Respiratory (~20 pathogens) panel by PCR     Status: None   Collection Time: 04/12/21  9:59 AM    Specimen: Nasopharyngeal Swab; Respiratory  Result Value Ref Range Status   Adenovirus NOT DETECTED NOT DETECTED Final   Coronavirus 229E NOT DETECTED NOT DETECTED Final    Comment: (NOTE) The Coronavirus on the Respiratory Panel, DOES NOT test for the novel  Coronavirus (2019 nCoV)    Coronavirus HKU1 NOT DETECTED NOT DETECTED Final   Coronavirus NL63 NOT DETECTED NOT DETECTED Final   Coronavirus OC43 NOT DETECTED NOT DETECTED Final   Metapneumovirus NOT DETECTED NOT DETECTED Final   Rhinovirus / Enterovirus NOT DETECTED NOT DETECTED Final   Influenza A NOT DETECTED NOT DETECTED Final   Influenza B NOT DETECTED NOT DETECTED Final   Parainfluenza Virus 1 NOT DETECTED NOT DETECTED Final   Parainfluenza Virus 2 NOT DETECTED NOT DETECTED Final   Parainfluenza Virus 3 NOT DETECTED NOT DETECTED Final   Parainfluenza Virus 4 NOT DETECTED NOT DETECTED Final   Respiratory Syncytial Virus NOT DETECTED NOT DETECTED Final   Bordetella pertussis NOT DETECTED NOT DETECTED Final   Bordetella Parapertussis NOT DETECTED NOT DETECTED Final   Chlamydophila pneumoniae NOT DETECTED NOT DETECTED Final   Mycoplasma pneumoniae NOT DETECTED NOT DETECTED Final    Comment: Performed at [facility], [place]. [address] [place], [place] [address]  Culture, blood (routine x 2)     Status: None   Collection Time: 04/14/21  5:37 AM   Specimen: BLOOD  Result Value Ref Range Status   Specimen Description   Final    BLOOD LEFT ANTECUBITAL Performed at [facility] [address] [place], [place] [address]    Special Requests   Final    BOTTLES DRAWN  AEROBIC ONLY Blood Culture adequate volume Performed at [facility] [address], [place], [place] [address]    Culture   Final    NO GROWTH 5 DAYS Performed at [facility], [place] [address], [place], [place] [address]    Report Status 04/19/2021 FINAL  Final  Culture, blood (routine x 2)     Status: None    Collection Time: 04/14/21  5:38 AM   Specimen: BLOOD  Result Value Ref Range Status   Specimen Description   Final    BLOOD BLOOD LEFT FOREARM Performed at [facility] [address] [place], [place] [address]    Special Requests   Final    BOTTLES DRAWN AEROBIC AND ANAEROBIC Blood Culture adequate volume Performed at [facility] [address] [place], [place] [address]    Culture   Final    NO GROWTH 5 DAYS Performed at [facility], [place] [address], [place], [place] [address]    Report Status 04/19/2021 FINAL  Final  SARS CORONAVIRUS 2 (TAT 6-24 HRS) Nasopharyngeal Nasopharyngeal Swab     Status: None   Collection Time: 04/18/21 11:10 AM   Specimen: Nasopharyngeal Swab  Result Value Ref Range Status   SARS Coronavirus 2 NEGATIVE NEGATIVE Final    Comment: (NOTE) SARS-CoV-2 target nucleic acids are NOT DETECTED.  The SARS-CoV-2 RNA is generally detectable in upper and lower respiratory specimens during the acute phase of infection. Negative results do not preclude SARS-CoV-2 infection, do not rule out co-infections with other pathogens, and should not be used as the sole basis for treatment or other patient management decisions. Negative results must be combined with clinical observations, patient history, and epidemiological information. The expected result is Negative.  Fact Sheet for Patients: SugarRoll.be  Fact Sheet for Healthcare Providers: https://www.woods-mathews.com/  This test is not yet approved or cleared by the [place] FDA and  has been authorized for detection and/or diagnosis of SARS-CoV-2 by FDA under an Emergency Use Authorization (EUA). This EUA will remain  in effect (meaning this test can be used) for the duration of the COVID-19 declaration under Se ction 564(b)(1) of the Act, 21 U.S.C. section 360bbb-3(b)(1), unless the authorization is terminated  or revoked sooner.  Performed at [facility], [place] [address], [place], [place] [address]     Studies/Results: ECHO TEE  Result Date: 04/18/2021    TRANSESOPHOGEAL ECHO REPORT   Patient Name:   Michael Hines Date of Exam: 04/18/2021 Medical Rec #:  [id number]         Height:       67.0 in Accession #:    [id number]        Weight:       207.6 lb Date of Birth:  Sep 17, 1945         BSA:          2.055 m Patient Age:    61 years          BP:           142/96 mmHg Patient Gender: M                 HR:           114 bpm. Exam Location:  Inpatient Procedure: 3D Echo, 2D Echo, Transesophageal Echo and Color Doppler Indications:     Bacteremia  History:         Patient has prior history of Echocardiogram  examinations, most                  recent 04/13/2021. COPD; Risk Factors:Diabetes and Dyslipidemia.                  Aortic Valve replaced with 76m Edwards Pericardial Tissue                  Valve in Dec 2011, Redo Valve Replacement with 264mPericardial                  Tissue Valve in Dec 2013.                  Aortic Valve: 21 mm bioprosthetic valve is present in the                  aortic position. Procedure Date: 07/18/2010.  Sonographer:     EmRaquel Sarnaenior RDCS Referring Phys:  [id number] Buttsiagnosing Phys: WeEleonore ChiquitoD PROCEDURE: After discussion of the risks and benefits of a TEE, an informed consent was obtained from the patient. TEE procedure time was 22 minutes. The transesophogeal probe was passed without difficulty through the esophogus of the patient. Imaged were obtained with the patient in a left lateral decubitus position. Local oropharyngeal anesthetic was provided with Cetacaine. Sedation performed by different physician. The patient was monitored while under deep sedation. Anesthestetic sedation was provided intravenously by Anesthesiology: [id number] Propofol. Image quality was excellent. The patient's vital signs; including heart rate, blood pressure, and oxygen  saturation; remained stable throughout the procedure. The patient developed no complications during the procedure. IMPRESSIONS  1. 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 V max 2.6 m/s, MG 15 mmHG, EOA 1.87 cm2, DI 0.54. No definite evidence of vegetation. The leaflets appear thickened and there is mild central regurgitation. Suspect the regurgitation is due to valve deterioration given the age of the valve and thickened leaflet appearance. No definitive evidence of aortic root abscess. Would recommend close echo follow-up after completion of antibiotics but no definitive evidence of endocarditis. The aortic valve has been repaired/replaced. Aortic valve regurgitation is mild. 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 Procedure Date: 07/18/2010.  2. Left ventricular ejection fraction, by estimation, is 60 to 65%. The left ventricle has normal function.  3. Right ventricular systolic function is normal. The right ventricular size is normal.  4. No left atrial/left atrial appendage thrombus was detected.  5. The mitral valve is degenerative. Mild mitral valve regurgitation. No evidence of mitral stenosis.  6. Aneurysm of the ascending aorta, measuring 45 mm. There is Moderate (Grade III) layered plaque involving the descending aorta. Conclusion(s)/Recommendation(s): No evidence of vegetation/infective endocarditis on this transesophageal echocardiogram. FINDINGS  Left Ventricle: Left ventricular ejection fraction, by estimation, is 60 to 65%. The left ventricle has normal function. The left ventricular internal cavity size was normal in size. Right Ventricle: The right ventricular size is normal. No increase in right ventricular wall thickness. Right ventricular systolic function is normal. Left Atrium: Left atrial size was normal in size. No left atrial/left atrial appendage thrombus was detected. Right Atrium: Right atrial size was normal in size. Pericardium: Trivial pericardial effusion  is present. Mitral Valve: The mitral valve is degenerative in appearance. Mild mitral valve regurgitation. No evidence of mitral valve stenosis. There is no evidence of mitral valve vegetation. Tricuspid Valve: The tricuspid valve is grossly normal. Tricuspid valve regurgitation is trivial. No evidence of tricuspid stenosis.  There is no evidence of tricuspid valve vegetation. Aortic Valve: 21 mm bioprosthetic aortic valve present. V max 2.6 m/s, MG 15 mmHG, EOA 1.87 cm2, DI 0.54. No definite evidence of vegetation. The leaflets appear thickened and there is mild central regurgitation. Suspect the regurgitation is due to valve  deterioration given the age of the valve and thickened leaflet appearance. No definitive evidence of aortic root abscess. Would recommend close echo follow-up after completion of antibiotics but no definitive evidence of endocarditis. The aortic valve has been repaired/replaced. Aortic valve regurgitation is mild. Aortic regurgitation PHT measures 631 msec. Aortic valve mean gradient measures 15.0 mmHg. Aortic valve peak gradient measures 26.0 mmHg. Aortic valve area, by VTI measures 1.87 cm. 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 Procedure Date: 07/18/2010. Pulmonic Valve: The pulmonic valve was grossly normal. Pulmonic valve regurgitation is not visualized. No evidence of pulmonic stenosis. There is no evidence of pulmonic valve vegetation. Aorta: The aortic root is normal in size and structure. There is an aneurysm involving the ascending aorta measuring 45 mm. There is moderate (Grade III) layered plaque involving the descending aorta. Venous: The left upper pulmonary vein, left lower pulmonary vein, right lower pulmonary vein and right upper pulmonary vein are normal. IAS/Shunts: The interatrial septum appears to be lipomatous. No atrial level shunt detected by color flow Doppler.  LEFT VENTRICLE PLAX 2D LVOT diam:     2.10 cm LV SV:         68 LV SV Index:    33 LVOT Area:     3.46 cm  AORTIC VALVE AV Area (Vmax):    1.78 cm AV Area (Vmean):   1.84 cm AV Area (VTI):     1.87 cm AV Vmax:           255.00 cm/s AV Vmean:          179.000 cm/s AV VTI:            0.365 m AV Peak Grad:      26.0 mmHg AV Mean Grad:      15.0 mmHg LVOT Vmax:         131.00 cm/s LVOT Vmean:        95.300 cm/s LVOT VTI:          0.197 m LVOT/AV VTI ratio: 0.54 AI PHT:            631 msec  AORTA Ao Root diam: 3.50 cm  SHUNTS Systemic VTI:  0.20 m Systemic Diam: 2.10 cm Eleonore Chiquito MD Electronically signed by Eleonore Chiquito MD Signature Date/Time: 04/18/2021/4:32:36 PM    Final    [place] EKG SITE RITE  Result Date: 04/19/2021 If Site Rite image not attached, placement could not be confirmed due to current cardiac rhythm.     Assessment/Plan:  INTERVAL HISTORY: Patient being discharged to [place] today.   Principal Problem:   MSSA bacteremia Active Problems:   Sepsis ([place])   COPD exacerbation ([place])   Acute respiratory failure with hypoxia ([facility])   S/P AVR   Parkinson disease ([place])   Diabetic polyneuropathy associated with type 2 diabetes mellitus ([place])   Chronic bilateral low back pain without sciatica    Michael Hines is a 75 y.o. male with  AVR sp redo in 2013 admitted with sepsis and MSSA bacteremia. Michael Hines has cleared his blood cultures. TTE did not show vegetations  TEE reviewed and no vegetations though structural deterioration of the valve  Blood  cultures negative x 4 days  Blood cultures reviewed and no growth final PICC line has been placed  Diagnosis:  MSSA bacteremia  Culture Result: MSSA  No Known Allergies  OPAT Orders Discharge antibiotics:  Cefazolin   Duration:  4 weeks End Date:  [id number]  [facility] Care Per Protocol:  Labs  weekly while on IV antibiotics: _x_ CBC with differential _x_ BMP w GFR/CMP __x CRP _x_ ESR    _x_ Please pull PIC at completion of IV antibiotics __ Please leave PIC in place until doctor has seen  patient or been notified  Fax weekly labs to [phone]  Clinic Follow Up Appt:   Michael Hines has an appointment on 05/13/2021 at 68PM   The [facility] is located in the [facility] at  [address] in [place].  [address], which is located to the left of the elevators.  Phone: [phone]  Fax: [phone]  [email or web address]  Michael Hines should arrive 15-30 minutes prior to his appointment.  I spent 36  minutes with the patient including face to face counseling of the patient his wife reviewing his TEE his MSSA bacteremia plan of treatment date of his follow-up appointment and showing him and his wife our website and instructions on how to get to our clinic, along with review of medical records before and during the visit and in coordination of his care.     LOS: 6 days   Alcide Evener 04/19/2021, 4:41 PM

## 2021-05-13 ENCOUNTER — Other Ambulatory Visit: Payer: Self-pay

## 2021-05-13 ENCOUNTER — Ambulatory Visit (INDEPENDENT_AMBULATORY_CARE_PROVIDER_SITE_OTHER): Payer: Medicare Other | Admitting: Infectious Disease

## 2021-05-13 ENCOUNTER — Encounter: Payer: Self-pay | Admitting: Infectious Disease

## 2021-05-13 ENCOUNTER — Emergency Department (HOSPITAL_COMMUNITY): Payer: Medicare Other

## 2021-05-13 ENCOUNTER — Inpatient Hospital Stay (HOSPITAL_COMMUNITY)
Admission: EM | Admit: 2021-05-13 | Discharge: 2021-05-28 | DRG: 314 | Disposition: A | Discharge status: Hospice | Payer: Medicare Other | Attending: Internal Medicine | Admitting: Internal Medicine

## 2021-05-13 ENCOUNTER — Encounter (HOSPITAL_COMMUNITY): Payer: Self-pay

## 2021-05-13 VITALS — HR 178

## 2021-05-13 DIAGNOSIS — R6520 Severe sepsis without septic shock: Secondary | ICD-10-CM | POA: Diagnosis present

## 2021-05-13 DIAGNOSIS — I471 Supraventricular tachycardia, unspecified: Secondary | ICD-10-CM

## 2021-05-13 DIAGNOSIS — A419 Sepsis, unspecified organism: Secondary | ICD-10-CM | POA: Diagnosis present

## 2021-05-13 DIAGNOSIS — G8929 Other chronic pain: Secondary | ICD-10-CM | POA: Diagnosis not present

## 2021-05-13 DIAGNOSIS — T826XXA Infection and inflammatory reaction due to cardiac valve prosthesis, initial encounter: Secondary | ICD-10-CM | POA: Diagnosis present

## 2021-05-13 DIAGNOSIS — E861 Hypovolemia: Secondary | ICD-10-CM | POA: Diagnosis not present

## 2021-05-13 DIAGNOSIS — E1142 Type 2 diabetes mellitus with diabetic polyneuropathy: Secondary | ICD-10-CM | POA: Diagnosis present

## 2021-05-13 DIAGNOSIS — I9589 Other hypotension: Secondary | ICD-10-CM | POA: Diagnosis not present

## 2021-05-13 DIAGNOSIS — E871 Hypo-osmolality and hyponatremia: Secondary | ICD-10-CM | POA: Diagnosis not present

## 2021-05-13 DIAGNOSIS — F05 Delirium due to known physiological condition: Secondary | ICD-10-CM | POA: Diagnosis not present

## 2021-05-13 DIAGNOSIS — I712 Thoracic aortic aneurysm, without rupture, unspecified: Secondary | ICD-10-CM

## 2021-05-13 DIAGNOSIS — G20 Parkinson's disease: Secondary | ICD-10-CM

## 2021-05-13 DIAGNOSIS — Y831 Surgical operation with implant of artificial internal device as the cause of abnormal reaction of the patient, or of later complication, without mention of misadventure at the time of the procedure: Secondary | ICD-10-CM | POA: Diagnosis present

## 2021-05-13 DIAGNOSIS — T68XXXA Hypothermia, initial encounter: Secondary | ICD-10-CM

## 2021-05-13 DIAGNOSIS — T68XXXD Hypothermia, subsequent encounter: Secondary | ICD-10-CM | POA: Diagnosis not present

## 2021-05-13 DIAGNOSIS — Z87891 Personal history of nicotine dependence: Secondary | ICD-10-CM

## 2021-05-13 DIAGNOSIS — R000 Tachycardia, unspecified: Secondary | ICD-10-CM

## 2021-05-13 DIAGNOSIS — I330 Acute and subacute infective endocarditis: Secondary | ICD-10-CM | POA: Diagnosis present

## 2021-05-13 DIAGNOSIS — R7989 Other specified abnormal findings of blood chemistry: Secondary | ICD-10-CM | POA: Diagnosis present

## 2021-05-13 DIAGNOSIS — I669 Occlusion and stenosis of unspecified cerebral artery: Secondary | ICD-10-CM

## 2021-05-13 DIAGNOSIS — M545 Low back pain, unspecified: Secondary | ICD-10-CM | POA: Diagnosis present

## 2021-05-13 DIAGNOSIS — Z7189 Other specified counseling: Secondary | ICD-10-CM | POA: Diagnosis not present

## 2021-05-13 DIAGNOSIS — E119 Type 2 diabetes mellitus without complications: Secondary | ICD-10-CM | POA: Diagnosis not present

## 2021-05-13 DIAGNOSIS — B3749 Other urogenital candidiasis: Secondary | ICD-10-CM | POA: Diagnosis not present

## 2021-05-13 DIAGNOSIS — G214 Vascular parkinsonism: Secondary | ICD-10-CM | POA: Diagnosis present

## 2021-05-13 DIAGNOSIS — R7881 Bacteremia: Secondary | ICD-10-CM | POA: Diagnosis not present

## 2021-05-13 DIAGNOSIS — Z66 Do not resuscitate: Secondary | ICD-10-CM | POA: Diagnosis present

## 2021-05-13 DIAGNOSIS — E860 Dehydration: Secondary | ICD-10-CM | POA: Diagnosis present

## 2021-05-13 DIAGNOSIS — I7121 Aneurysm of the ascending aorta, without rupture: Secondary | ICD-10-CM | POA: Diagnosis present

## 2021-05-13 DIAGNOSIS — I38 Endocarditis, valve unspecified: Secondary | ICD-10-CM

## 2021-05-13 DIAGNOSIS — R0609 Other forms of dyspnea: Secondary | ICD-10-CM | POA: Diagnosis not present

## 2021-05-13 DIAGNOSIS — A021 Salmonella sepsis: Secondary | ICD-10-CM | POA: Diagnosis not present

## 2021-05-13 DIAGNOSIS — R627 Adult failure to thrive: Secondary | ICD-10-CM | POA: Diagnosis present

## 2021-05-13 DIAGNOSIS — D72829 Elevated white blood cell count, unspecified: Secondary | ICD-10-CM

## 2021-05-13 DIAGNOSIS — Z8249 Family history of ischemic heart disease and other diseases of the circulatory system: Secondary | ICD-10-CM

## 2021-05-13 DIAGNOSIS — Z794 Long term (current) use of insulin: Secondary | ICD-10-CM

## 2021-05-13 DIAGNOSIS — G9340 Encephalopathy, unspecified: Secondary | ICD-10-CM | POA: Diagnosis not present

## 2021-05-13 DIAGNOSIS — Z515 Encounter for palliative care: Secondary | ICD-10-CM | POA: Diagnosis not present

## 2021-05-13 DIAGNOSIS — R680 Hypothermia, not associated with low environmental temperature: Secondary | ICD-10-CM | POA: Diagnosis present

## 2021-05-13 DIAGNOSIS — J449 Chronic obstructive pulmonary disease, unspecified: Secondary | ICD-10-CM | POA: Diagnosis present

## 2021-05-13 DIAGNOSIS — Z20822 Contact with and (suspected) exposure to covid-19: Secondary | ICD-10-CM | POA: Diagnosis present

## 2021-05-13 DIAGNOSIS — R6521 Severe sepsis with septic shock: Secondary | ICD-10-CM

## 2021-05-13 DIAGNOSIS — I472 Ventricular tachycardia, unspecified: Secondary | ICD-10-CM | POA: Diagnosis not present

## 2021-05-13 DIAGNOSIS — I350 Nonrheumatic aortic (valve) stenosis: Secondary | ICD-10-CM | POA: Diagnosis not present

## 2021-05-13 DIAGNOSIS — G20A1 Parkinson's disease without dyskinesia, without mention of fluctuations: Secondary | ICD-10-CM

## 2021-05-13 DIAGNOSIS — R0902 Hypoxemia: Secondary | ICD-10-CM

## 2021-05-13 DIAGNOSIS — B9561 Methicillin susceptible Staphylococcus aureus infection as the cause of diseases classified elsewhere: Secondary | ICD-10-CM | POA: Diagnosis not present

## 2021-05-13 DIAGNOSIS — I6381 Other cerebral infarction due to occlusion or stenosis of small artery: Secondary | ICD-10-CM | POA: Diagnosis present

## 2021-05-13 DIAGNOSIS — G9341 Metabolic encephalopathy: Secondary | ICD-10-CM | POA: Diagnosis present

## 2021-05-13 DIAGNOSIS — Z833 Family history of diabetes mellitus: Secondary | ICD-10-CM

## 2021-05-13 DIAGNOSIS — E876 Hypokalemia: Secondary | ICD-10-CM | POA: Diagnosis present

## 2021-05-13 DIAGNOSIS — I959 Hypotension, unspecified: Secondary | ICD-10-CM | POA: Diagnosis present

## 2021-05-13 DIAGNOSIS — I10 Essential (primary) hypertension: Secondary | ICD-10-CM | POA: Diagnosis present

## 2021-05-13 DIAGNOSIS — T826XXD Infection and inflammatory reaction due to cardiac valve prosthesis, subsequent encounter: Secondary | ICD-10-CM | POA: Diagnosis not present

## 2021-05-13 DIAGNOSIS — E785 Hyperlipidemia, unspecified: Secondary | ICD-10-CM | POA: Diagnosis present

## 2021-05-13 DIAGNOSIS — N179 Acute kidney failure, unspecified: Secondary | ICD-10-CM | POA: Diagnosis present

## 2021-05-13 DIAGNOSIS — Z79899 Other long term (current) drug therapy: Secondary | ICD-10-CM

## 2021-05-13 DIAGNOSIS — I76 Septic arterial embolism: Secondary | ICD-10-CM | POA: Diagnosis present

## 2021-05-13 DIAGNOSIS — E46 Unspecified protein-calorie malnutrition: Secondary | ICD-10-CM | POA: Diagnosis present

## 2021-05-13 DIAGNOSIS — B370 Candidal stomatitis: Secondary | ICD-10-CM | POA: Diagnosis present

## 2021-05-13 DIAGNOSIS — Z6829 Body mass index (BMI) 29.0-29.9, adult: Secondary | ICD-10-CM

## 2021-05-13 DIAGNOSIS — R0682 Tachypnea, not elsewhere classified: Secondary | ICD-10-CM

## 2021-05-13 DIAGNOSIS — I351 Nonrheumatic aortic (valve) insufficiency: Secondary | ICD-10-CM | POA: Diagnosis not present

## 2021-05-13 DIAGNOSIS — R001 Bradycardia, unspecified: Secondary | ICD-10-CM | POA: Diagnosis present

## 2021-05-13 DIAGNOSIS — R4182 Altered mental status, unspecified: Secondary | ICD-10-CM | POA: Diagnosis not present

## 2021-05-13 DIAGNOSIS — Z952 Presence of prosthetic heart valve: Secondary | ICD-10-CM

## 2021-05-13 DIAGNOSIS — Z7951 Long term (current) use of inhaled steroids: Secondary | ICD-10-CM

## 2021-05-13 DIAGNOSIS — Z8673 Personal history of transient ischemic attack (TIA), and cerebral infarction without residual deficits: Secondary | ICD-10-CM

## 2021-05-13 HISTORY — DX: Sepsis, unspecified organism: A41.9

## 2021-05-13 HISTORY — DX: Hypotension, unspecified: I95.9

## 2021-05-13 HISTORY — DX: Tachycardia, unspecified: R00.0

## 2021-05-13 HISTORY — DX: Tachypnea, not elsewhere classified: R06.82

## 2021-05-13 HISTORY — DX: Hypothermia, initial encounter: T68.XXXA

## 2021-05-13 LAB — CBC WITH DIFFERENTIAL/PLATELET
Abs Immature Granulocytes: 0 10*3/uL (ref 0.00–0.07)
Basophils Absolute: 0 10*3/uL (ref 0.0–0.1)
Basophils Relative: 0 %
Eosinophils Absolute: 0.3 10*3/uL (ref 0.0–0.5)
Eosinophils Relative: 3 %
HCT: 51 % (ref 39.0–52.0)
Hemoglobin: 16.3 g/dL (ref 13.0–17.0)
Lymphocytes Relative: 4 %
Lymphs Abs: 0.5 10*3/uL — ABNORMAL LOW (ref 0.7–4.0)
MCH: 30.6 pg (ref 26.0–34.0)
MCHC: 32 g/dL (ref 30.0–36.0)
MCV: 95.9 fL (ref 80.0–100.0)
Monocytes Absolute: 1.1 10*3/uL — ABNORMAL HIGH (ref 0.1–1.0)
Monocytes Relative: 10 %
Neutro Abs: 9.4 10*3/uL — ABNORMAL HIGH (ref 1.7–7.7)
Neutrophils Relative %: 83 %
Platelets: 244 10*3/uL (ref 150–400)
RBC: 5.32 MIL/uL (ref 4.22–5.81)
RDW: 15 % (ref 11.5–15.5)
WBC: 11.3 10*3/uL — ABNORMAL HIGH (ref 4.0–10.5)
nRBC: 0 % (ref 0.0–0.2)
nRBC: 0 /100 WBC

## 2021-05-13 LAB — COMPREHENSIVE METABOLIC PANEL
ALT: <5 U/L (ref 0–44)
AST: 21 U/L (ref 15–41)
Albumin: 2.9 g/dL — ABNORMAL LOW (ref 3.5–5.0)
Alkaline Phosphatase: 118 U/L (ref 38–126)
Anion gap: 16 — ABNORMAL HIGH (ref 5–15)
BUN: 8 mg/dL (ref 8–23)
CO2: 18 mmol/L — ABNORMAL LOW (ref 22–32)
Calcium: 8.8 mg/dL — ABNORMAL LOW (ref 8.9–10.3)
Chloride: 101 mmol/L (ref 98–111)
Creatinine, Ser: 1.67 mg/dL — ABNORMAL HIGH (ref 0.61–1.24)
GFR, Estimated: 43 mL/min — ABNORMAL LOW (ref >60)
Glucose, Bld: 132 mg/dL — ABNORMAL HIGH (ref 70–99)
Potassium: 3.2 mmol/L — ABNORMAL LOW (ref 3.5–5.1)
Sodium: 135 mmol/L (ref 135–145)
Total Bilirubin: 1.3 mg/dL — ABNORMAL HIGH (ref 0.3–1.2)
Total Protein: 6.3 g/dL — ABNORMAL LOW (ref 6.5–8.1)

## 2021-05-13 LAB — URINALYSIS, ROUTINE W REFLEX MICROSCOPIC
Bilirubin Urine: NEGATIVE
Glucose, UA: NEGATIVE mg/dL
Ketones, ur: 20 mg/dL — AB
Leukocytes,Ua: NEGATIVE
Nitrite: NEGATIVE
Protein, ur: 30 mg/dL — AB
Specific Gravity, Urine: 1.018 (ref 1.005–1.030)
pH: 6 (ref 5.0–8.0)

## 2021-05-13 LAB — RESP PANEL BY RT-PCR (FLU A&B, COVID) ARPGX2
Influenza A by PCR: NEGATIVE
Influenza B by PCR: NEGATIVE
SARS Coronavirus 2 by RT PCR: NEGATIVE

## 2021-05-13 LAB — D-DIMER, QUANTITATIVE: D-Dimer, Quant: 1.2 ug/mL-FEU — ABNORMAL HIGH (ref 0.00–0.50)

## 2021-05-13 LAB — LACTIC ACID, PLASMA
Lactic Acid, Venous: 2 mmol/L (ref 0.5–1.9)
Lactic Acid, Venous: 1.4 mmol/L (ref 0.5–1.9)

## 2021-05-13 IMAGING — DX DG CHEST 1V PORT
1 series · 1 of 1 positions shown · non-contrast
Comparison: [DATE]

CLINICAL DATA: Diaphoretic tachycardia

EXAM:
PORTABLE CHEST 1 VIEW

[chest]
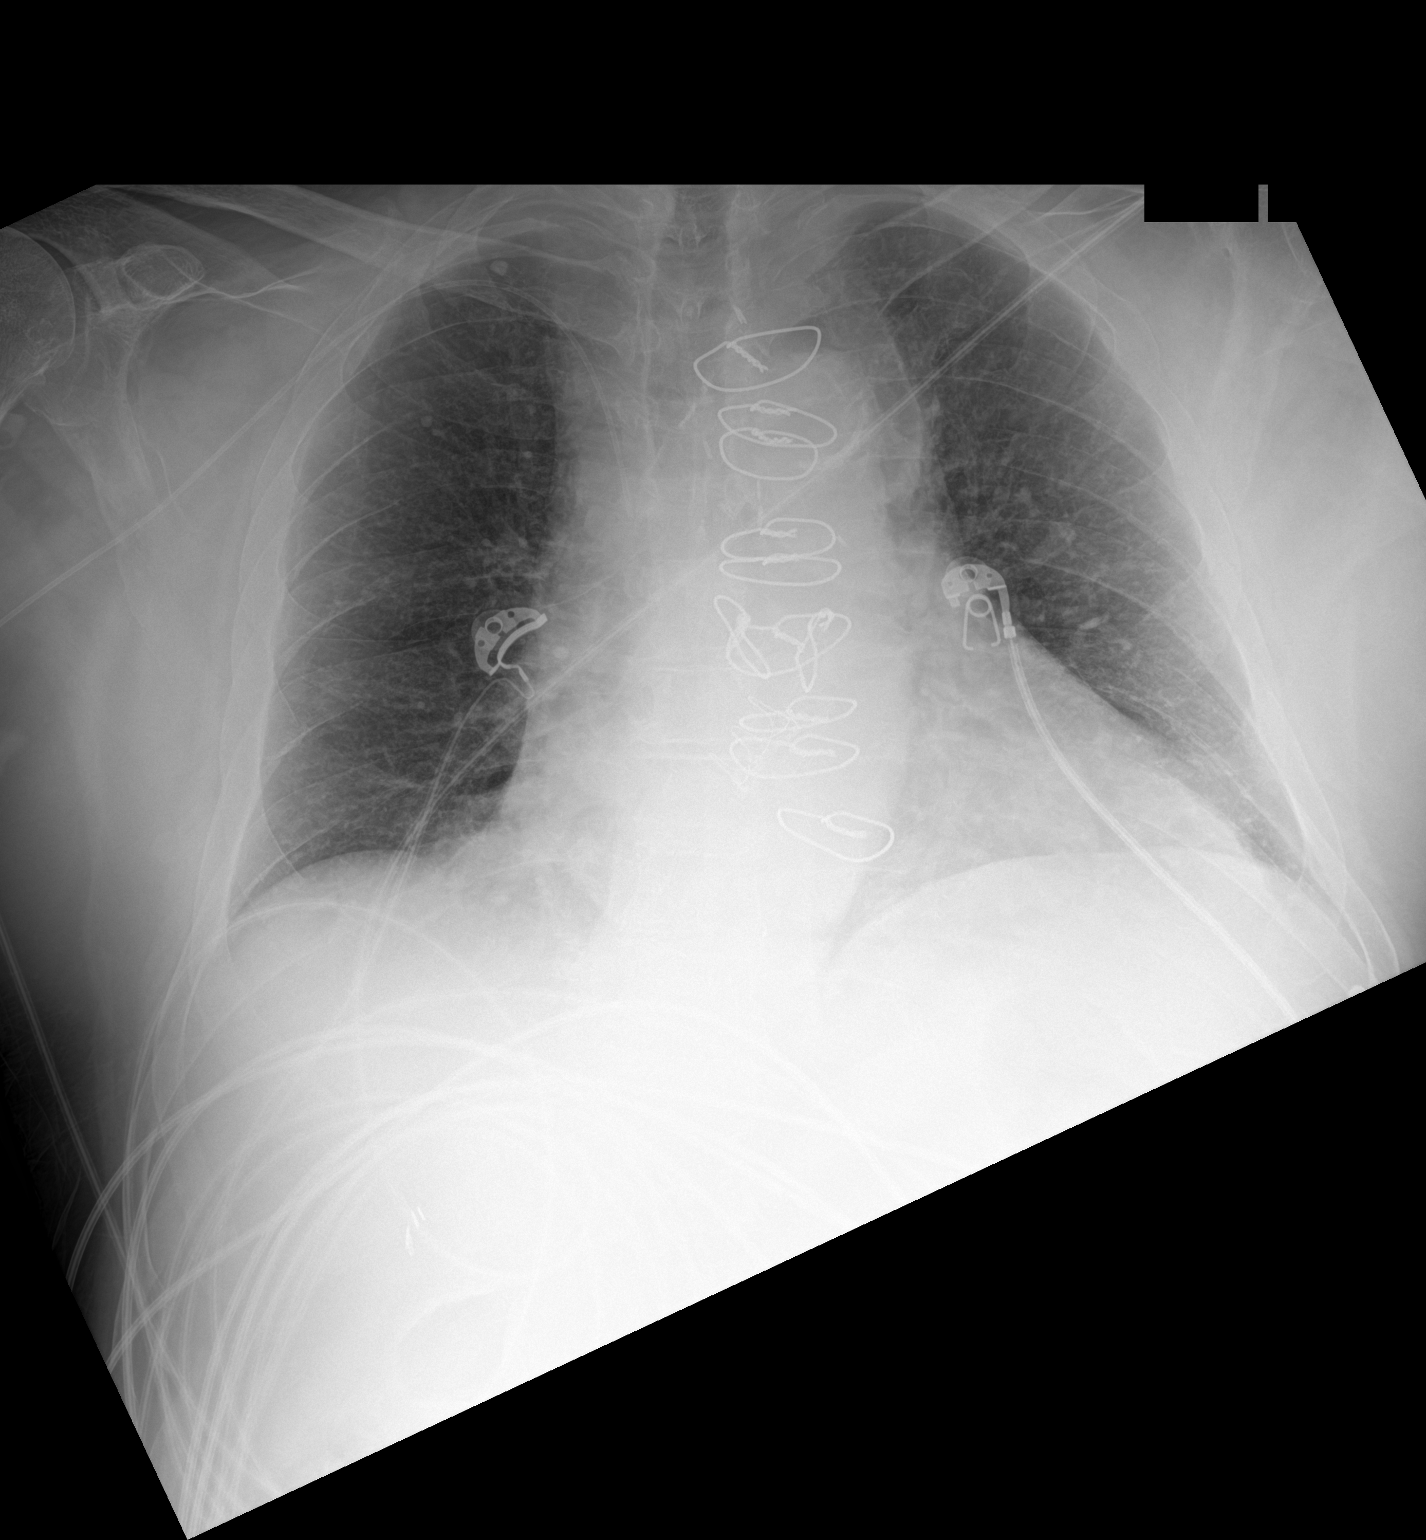

[1 of 1 positions shown; findings below may reference images not displayed]

FINDINGS: Right upper extremity central venous catheter tip over the SVC. Post
sternotomy changes. Cardiomegaly without focal airspace disease,
effusion or pneumothorax. Small calcified lung nodules on right.
Aortic atherosclerosis
IMPRESSION: Cardiomegaly without focal airspace disease

## 2021-05-13 MED ORDER — LACTATED RINGERS IV BOLUS (SEPSIS)
1000.0000 mL | Freq: Once | INTRAVENOUS | Status: AC
  Administered 2021-05-13: 1000 mL via INTRAVENOUS

## 2021-05-13 MED ORDER — SODIUM CHLORIDE 0.9 % IV SOLN
2.0000 g | Freq: Three times a day (TID) | INTRAVENOUS | Status: DC
  Filled 2021-05-13: qty 2

## 2021-05-13 MED ORDER — INSULIN ASPART 100 UNIT/ML IJ SOLN: 0.0000 [IU] | Freq: Every day | INTRAMUSCULAR | Status: DC

## 2021-05-13 MED ORDER — ONDANSETRON HCL 4 MG/2ML IJ SOLN
4.0000 mg | Freq: Four times a day (QID) | INTRAMUSCULAR | Status: DC | PRN
  Administered 2021-05-14 – 2021-05-27 (×9): 4 mg via INTRAVENOUS
  Filled 2021-05-13 (×10): qty 2

## 2021-05-13 MED ORDER — CLOTRIMAZOLE 1 % EX CREA
TOPICAL_CREAM | Freq: Two times a day (BID) | CUTANEOUS | Status: DC
  Administered 2021-05-26 – 2021-05-27 (×2): 1 via TOPICAL
  Filled 2021-05-13 (×2): qty 15

## 2021-05-13 MED ORDER — SODIUM CHLORIDE 0.9 % IV SOLN
2.0000 g | Freq: Once | INTRAVENOUS | Status: AC
  Administered 2021-05-13: 2 g via INTRAVENOUS
  Filled 2021-05-13: qty 2

## 2021-05-13 MED ORDER — HYDROCODONE-ACETAMINOPHEN 10-325 MG PO TABS
1.0000 | ORAL_TABLET | Freq: Four times a day (QID) | ORAL | Status: DC | PRN
  Administered 2021-05-14: 1 via ORAL
  Filled 2021-05-13: qty 1

## 2021-05-13 MED ORDER — BUPROPION HCL ER (XL) 150 MG PO TB24
300.0000 mg | ORAL_TABLET | Freq: Every day | ORAL | Status: DC
  Administered 2021-05-14: 300 mg via ORAL
  Filled 2021-05-13 (×3): qty 1
  Filled 2021-05-13: qty 2

## 2021-05-13 MED ORDER — ACETAMINOPHEN 325 MG PO TABS
650.0000 mg | ORAL_TABLET | Freq: Four times a day (QID) | ORAL | Status: DC | PRN
  Administered 2021-05-19 – 2021-05-21 (×3): 650 mg via ORAL
  Filled 2021-05-13 (×3): qty 2

## 2021-05-13 MED ORDER — LACTATED RINGERS IV SOLN: INTRAVENOUS | Status: DC

## 2021-05-13 MED ORDER — CARBIDOPA-LEVODOPA 25-100 MG PO TABS
1.0000 | ORAL_TABLET | Freq: Two times a day (BID) | ORAL | Status: DC
  Administered 2021-05-14 – 2021-05-15 (×3): 1 via ORAL
  Filled 2021-05-13 (×5): qty 1

## 2021-05-13 MED ORDER — POTASSIUM CHLORIDE 10 MEQ/100ML IV SOLN
10.0000 meq | INTRAVENOUS | Status: AC
  Administered 2021-05-14 (×2): 10 meq via INTRAVENOUS
  Filled 2021-05-13 (×2): qty 100

## 2021-05-13 MED ORDER — IPRATROPIUM-ALBUTEROL 0.5-2.5 (3) MG/3ML IN SOLN
3.0000 mL | Freq: Four times a day (QID) | RESPIRATORY_TRACT | Status: DC | PRN
  Administered 2021-05-18: 3 mL via RESPIRATORY_TRACT
  Filled 2021-05-13: qty 3

## 2021-05-13 MED ORDER — VANCOMYCIN HCL 1750 MG/350ML IV SOLN
1750.0000 mg | Freq: Once | INTRAVENOUS | Status: AC
  Administered 2021-05-13: 1750 mg via INTRAVENOUS
  Filled 2021-05-13: qty 350

## 2021-05-13 MED ORDER — INSULIN ASPART 100 UNIT/ML IJ SOLN
0.0000 [IU] | Freq: Three times a day (TID) | INTRAMUSCULAR | Status: DC
  Administered 2021-05-15: 2 [IU] via SUBCUTANEOUS
  Administered 2021-05-16: 5 [IU] via SUBCUTANEOUS
  Administered 2021-05-17 – 2021-05-22 (×9): 2 [IU] via SUBCUTANEOUS
  Administered 2021-05-24: 3 [IU] via SUBCUTANEOUS
  Administered 2021-05-24: 2 [IU] via SUBCUTANEOUS

## 2021-05-13 MED ORDER — ENOXAPARIN SODIUM 40 MG/0.4ML IJ SOSY
40.0000 mg | PREFILLED_SYRINGE | INTRAMUSCULAR | Status: DC
  Administered 2021-05-14 – 2021-05-26 (×10): 40 mg via SUBCUTANEOUS
  Filled 2021-05-13 (×12): qty 0.4

## 2021-05-13 MED ORDER — ONDANSETRON HCL 4 MG PO TABS: 4.0000 mg | ORAL_TABLET | Freq: Four times a day (QID) | ORAL | Status: DC | PRN

## 2021-05-13 MED ORDER — VANCOMYCIN HCL 750 MG/150ML IV SOLN: 750.0000 mg | INTRAVENOUS | Status: DC

## 2021-05-13 MED ORDER — ACETAMINOPHEN 650 MG RE SUPP: 650.0000 mg | Freq: Four times a day (QID) | RECTAL | Status: DC | PRN

## 2021-05-13 MED ORDER — FLUTICASONE FUROATE-VILANTEROL 100-25 MCG/INH IN AEPB
1.0000 | INHALATION_SPRAY | Freq: Every day | RESPIRATORY_TRACT | Status: DC
  Administered 2021-05-14 – 2021-05-26 (×7): 1 via RESPIRATORY_TRACT
  Filled 2021-05-13 (×2): qty 28

## 2021-05-13 NOTE — Subjective & Objective (Signed)
CC: hypotension, hypothermia HPI: 75 year old male with a history of COPD, type 2 diabetes, history of bioprosthetic aortic valve replacement who presents to the ER today from the ID clinic.  Patient had spent several days in the hospital last month due to MSSA septicemia.  Patient underwent transesophageal echocardiogram along with MRI of the back.  There is no source found for his bacteremia.  Patient discharged to the nursing home to complete 4 weeks of IV Ancef.  Patient was seen in ID clinic today for follow-up.  Patient noted be hypothermic and hypotensive in the office.  Patient sent to the ER for evaluation.  On arrival to the ER patient's core temp was 95 degrees.  He was noted to be hypotensive with a soft blood pressure 98.  Patient underwent code sepsis protocol.  Patient given 30 cc/kg of IV fluids.    Lab evaluation in the ER patient's lactic acid was elevated 2.0.  White count elevated 11.3.  Patient tested negative for influenza and COVID-19.  Chemistries serum CO2 was 18.  BUN of 8, creatinine 1.67.  Serum creatinine has elevated since he was discharged from the hospital.  On discharge creatinine 0.9.  Chest x-ray is negative.  Wife states the patient has been rehabilitating at [facility] home.  She states patient has been participating with physical therapy.  He continues to have whole body myalgias.  He continues to complain of back pain.  Patient is unable to get his ESI injections of his back for least 8 weeks now.  Wife states patient's had intermittent nausea vomiting over the last 4 weeks.  Patient had a poor appetite.  He denies any recent fever or chills.  Denies any diarrhea.  Denies any abdominal pain.  He denies any shortness of breath or cough.  There has been no purulent drainage from his PICC line site.  PICC line site is nontender.  To the patient's sepsis, hypotension, hypothermia, [facility] hospitalist contacted for admission.

## 2021-05-13 NOTE — Assessment & Plan Note (Signed)
Resolved after IVF 30 ml/kg bolus.

## 2021-05-13 NOTE — ED Notes (Signed)
Provider is aware of lactic

## 2021-05-13 NOTE — Assessment & Plan Note (Signed)
Continue with hydrocodone for back pain. Pt unable to have ESI due to MSSA septicemia. Has been at least 8 weeks since last injection.

## 2021-05-13 NOTE — Addendum Note (Signed)
Addended by: Alcide Evener N on: 05/13/2021 04:07 PM   Modules accepted: Orders

## 2021-05-13 NOTE — H&P (Signed)
History and Physical    Michael Hines DGU:[id number] DOB: Apr 17, 1946 DOA: 05/13/2021  PCP: Finis Bud, MD   Patient coming from: [facility]  I have personally briefly reviewed patient's old medical records in [place]  CC: hypotension, hypothermia HPI: 75 year old male with a history of COPD, type 2 diabetes, history of bioprosthetic aortic valve replacement who presents to the ER today from the ID clinic.  Patient had spent several days in the hospital last month due to MSSA septicemia.  Patient underwent transesophageal echocardiogram along with MRI of the back.  There is no source found for his bacteremia.  Patient discharged to the nursing home to complete 4 weeks of IV Ancef.  Patient was seen in ID clinic today for follow-up.  Patient noted be hypothermic and hypotensive in the office.  Patient sent to the ER for evaluation.  On arrival to the ER patient's core temp was 95 degrees.  He was noted to be hypotensive with a soft blood pressure 98.  Patient underwent code sepsis protocol.  Patient given 30 cc/kg of IV fluids.    Lab evaluation in the ER patient's lactic acid was elevated 2.0.  White count elevated 11.3.  Patient tested negative for influenza and COVID-19.  Chemistries serum CO2 was 18.  BUN of 8, creatinine 1.67.  Serum creatinine has elevated since he was discharged from the hospital.  On discharge creatinine 0.9.  Chest x-ray is negative.  Wife states the patient has been rehabilitating at [facility] home.  She states patient has been participating with physical therapy.  He continues to have whole body myalgias.  He continues to complain of back pain.  Patient is unable to get his ESI injections of his back for least 8 weeks now.  Wife states patient's had intermittent nausea vomiting over the last 4 weeks.  Patient had a poor appetite.  He denies any recent fever or chills.  Denies any diarrhea.  Denies any abdominal pain.  He denies any  shortness of breath or cough.  There has been no purulent drainage from his PICC line site.  PICC line site is nontender.  To the patient's sepsis, hypotension, hypothermia, [facility] hospitalist contacted for admission.   ED Course: IVF 30 ml/kg bolus given. Blood cx obtained. Pt started on cefepime and vanco.  Review of Systems:  Review of Systems  Constitutional:  Positive for malaise/fatigue. Negative for chills and fever.  HENT: Negative.    Eyes: Negative.   Respiratory: Negative.    Cardiovascular: Negative.   Gastrointestinal:  Positive for nausea and vomiting.  Genitourinary: Negative.   Musculoskeletal:  Positive for back pain.       Chronic low back pain  Skin:        Multiple skin abrasions due to repeated falls  Neurological:  Positive for weakness.       Falls frequently. 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  Endo/Heme/Allergies: Negative.   Psychiatric/Behavioral: Negative.    All other systems reviewed and are negative.  Past Medical History:  Diagnosis Date   Anxiety    B12 deficiency    Back pain    Diabetes mellitus without complication ([place])    Hyperlipidemia    Hypotension 05/13/2021   Hypothermia 05/13/2021   Severe sepsis with septic shock ([place]) 05/13/2021   Tachycardia 05/13/2021   Tachypnea 05/13/2021   Testosterone insufficiency     Past Surgical History:  Procedure Laterality Date   AORTIC VALVE  REPLACEMENT  2013   redo bioprosthetic valve at [facility], Dr Mauricio Po   CHOLECYSTECTOMY     STRABISMUS SURGERY     TEE WITHOUT CARDIOVERSION N/A 09/07/2018   Procedure: TRANSESOPHAGEAL ECHOCARDIOGRAM (TEE);  Surgeon: Acie Fredrickson Wonda Cheng, MD;  Location: [facility] ENDOSCOPY;  Service: Cardiovascular;  Laterality: N/A;   TEE WITHOUT CARDIOVERSION N/A 04/18/2021   Procedure: TRANSESOPHAGEAL ECHOCARDIOGRAM (TEE);  Surgeon: Geralynn Rile, MD;  Location: [facility];  Service: Cardiovascular;  Laterality: N/A;     reports that he  quit smoking about 2 years ago. His smoking use included cigarettes. He has a 82.50 pack-year smoking history. He has quit using smokeless tobacco. He reports that he does not currently use alcohol. He reports that he does not use drugs.  No Known Allergies  Family History  Problem Relation Age of Onset   Hypertension Mother    Diabetes Mother     Prior to Admission medications   Medication Sig Start Date End Date Taking? Authorizing Provider  budesonide-formoterol (SYMBICORT) 80-4.5 MCG/ACT inhaler Inhale 2 puffs into the lungs 2 (two) times daily.    [provider]  buPROPion (WELLBUTRIN XL) 300 MG 24 hr tablet Take 300 mg by mouth daily. 07/04/19   [provider]  carbidopa-levodopa (SINEMET IR) 25-100 MG tablet Take 1 tablet by mouth 2 (two) times daily. 01/03/20   [provider]  ceFAZolin (ANCEF) IVPB Inject 2 g into the vein every 8 (eight) hours. Indication:  MSSA bacteremia First Dose: Yes Last Day of Therapy:  05/13/21 Labs - Once weekly:  CBC/D and BMP, Labs - Every other week:  ESR and CRP Method of administration: IV Push Method of administration may be changed at the discretion of home infusion pharmacist based upon assessment of the patient and/or caregiver's ability to self-administer the medication ordered. 04/19/21   Geradine Girt, DO  cyanocobalamin (,VITAMIN B-12,) 1000 MCG/ML injection Inject 1,000 mcg into the muscle every 30 (thirty) days.  08/17/18   [provider]  HYDROcodone-acetaminophen (NORCO) 10-325 MG tablet Take 1 tablet by mouth every 6 (six) hours as needed. 04/19/21   Geradine Girt, DO  insulin aspart (NOVOLOG) 100 UNIT/ML injection Inject 0-15 Units into the skin 3 (three) times daily with meals. 04/19/21   Geradine Girt, DO  ipratropium-albuterol (DUONEB) 0.5-2.5 (3) MG/3ML SOLN Take 3 mLs by nebulization every 6 (six) hours as needed. 04/19/21   Geradine Girt, DO  loperamide (IMODIUM) 2 MG capsule Take 1 capsule (2  mg total) by mouth as needed for diarrhea or loose stools. 09/07/18   Kayleen Memos, DO    Physical Exam: Vitals:   05/13/21 1800 05/13/21 1905 05/13/21 2000 05/13/21 2017  BP: (!) 148/89 (!) 123/92 (!) 141/85   Pulse: 93 73 72   Resp: '20 17 20   ' Temp:    99.1 F (37.3 C)  TempSrc:    Oral  SpO2: 95% 96% 94%   Weight:      Height:        Physical Exam Vitals and nursing note reviewed.  Constitutional:      General: He is not in acute distress.    Appearance: He is not toxic-appearing or diaphoretic.     Comments: Chronically ill appearing male  HENT:     Head: Normocephalic and atraumatic.     Nose: Nose normal. No rhinorrhea.  Eyes:     General:        Right eye: No discharge.  Left eye: No discharge.  Cardiovascular:     Rate and Rhythm: Normal rate and regular rhythm.  Pulmonary:     Effort: Pulmonary effort is normal. No respiratory distress.     Breath sounds: Normal breath sounds. No wheezing or rales.  Abdominal:     General: Bowel sounds are normal. There is no distension.     Palpations: Abdomen is soft.     Tenderness: There is no abdominal tenderness. There is no guarding or rebound.     Comments: Actively vomiting bilious emesis  Musculoskeletal:     Right lower leg: No edema.     Left lower leg: No edema.     Comments: Right UE picc line insertion site without purulent drainage  Skin:    General: Skin is warm and dry.     Capillary Refill: Capillary refill takes less than 2 seconds.     Findings: Lesion present.     Comments: Multiple abrasions on lower legs and forearms bilaterally. No palpable abscesses.  Neurological:     General: No focal deficit present.     Mental Status: He is alert and oriented to person, place, and time.     Labs on Admission: I have personally reviewed following labs and imaging studies  CBC: Recent Labs  Lab 05/13/21 1654  WBC 11.3*  NEUTROABS 9.4*  HGB 16.3  HCT 51.0  MCV 95.9  PLT 569   Basic Metabolic  Panel: Recent Labs  Lab 05/13/21 1654  NA 135  K 3.2*  CL 101  CO2 18*  GLUCOSE 132*  BUN 8  CREATININE 1.67*  CALCIUM 8.8*   GFR: Estimated Creatinine Clearance: 42.3 mL/min (A) (by C-G formula based on SCr of 1.67 mg/dL (H)). Liver Function Tests: Recent Labs  Lab 05/13/21 1654  AST 21  ALT <5  ALKPHOS 118  BILITOT 1.3*  PROT 6.3*  ALBUMIN 2.9*   No results for input(s): LIPASE, AMYLASE in the last 168 hours. No results for input(s): AMMONIA in the last 168 hours. Coagulation Profile: No results for input(s): INR, PROTIME in the last 168 hours. Cardiac Enzymes: No results for input(s): CKTOTAL, CKMB, CKMBINDEX, TROPONINI in the last 168 hours. BNP (last 3 results) No results for input(s): PROBNP in the last 8760 hours. HbA1C: No results for input(s): HGBA1C in the last 72 hours. CBG: No results for input(s): GLUCAP in the last 168 hours. Lipid Profile: No results for input(s): CHOL, HDL, LDLCALC, TRIG, CHOLHDL, LDLDIRECT in the last 72 hours. Thyroid Function Tests: No results for input(s): TSH, T4TOTAL, FREET4, T3FREE, THYROIDAB in the last 72 hours. Anemia Panel: No results for input(s): VITAMINB12, FOLATE, FERRITIN, TIBC, IRON, RETICCTPCT in the last 72 hours. Urine analysis:    Component Value Date/Time   COLORURINE YELLOW 04/12/2021 0616   APPEARANCEUR CLEAR 04/12/2021 0616   LABSPEC 1.020 04/12/2021 0616   PHURINE 6.0 04/12/2021 0616   GLUCOSEU 500 (A) 04/12/2021 0616   HGBUR NEGATIVE 04/12/2021 0616   BILIRUBINUR NEGATIVE 04/12/2021 0616   KETONESUR NEGATIVE 04/12/2021 0616   PROTEINUR NEGATIVE 04/12/2021 0616   NITRITE NEGATIVE 04/12/2021 0616   LEUKOCYTESUR NEGATIVE 04/12/2021 0616    Radiological Exams on Admission: I have personally reviewed images DG Chest Port 1 View  Result Date: 05/13/2021 CLINICAL DATA:  Diaphoretic tachycardia EXAM: PORTABLE CHEST 1 VIEW COMPARISON:  04/12/2021 FINDINGS: Right upper extremity central venous catheter  tip over the SVC. Post sternotomy changes. Cardiomegaly without focal airspace disease, effusion or pneumothorax. Small calcified lung nodules on  right. Aortic atherosclerosis IMPRESSION: Cardiomegaly without focal airspace disease Electronically Signed   By: Donavan Foil M.D.   On: 05/13/2021 17:40    EKG: I have personally reviewed EKG: no ekg to review  Assessment/Plan Principal Problem:   Sepsis with acute organ dysfunction (HCC) Active Problems:   Hypotension   Hypothermia   AKI (acute kidney injury) ([place])   Diabetes mellitus without complication ([place])   S/P AVR   Diabetic polyneuropathy associated with type 2 diabetes mellitus (HCC)   Chronic bilateral low back pain without sciatica    Sepsis with acute organ dysfunction ([place]) Admit To MedSurg bed.  Patient treated with 30 cc/kg of IV fluids.  Patient's hypotension has resolved.  Wife states patient appears much improved since this afternoon.  ID wanted patient be treated with cefepime and vancomycin.  We will continue this.  ID consult in the morning.  Patient's PICC line may need to be removed and the tip sent for culture.  Pt and wife are aware that if the patient develops MSSA septicemia given, he will need another complete work-up for the source. Sepsis with acute organ dysfunction due potential infection with AKI and lactic acidosis.  Diabetes mellitus without complication ([place]) Continue SSI  S/P AVR Stable.  Diabetic polyneuropathy associated with type 2 diabetes mellitus ([place]) Continue SSI.  Chronic bilateral low back pain without sciatica Continue with hydrocodone for back pain. Pt unable to have ESI due to MSSA septicemia. Has been at least 8 weeks since last injection.  Hypotension Resolved after IVF 30 ml/kg bolus.  Hypothermia Resolved.  AKI (acute kidney injury) ([place]) Discharge Scr was 0.9.  Continue with IVF. Repeat CMP in AM.  DVT prophylaxis: Lovenox Code Status: Full Code Family Communication:  discussed with pt and wife(diane) at bedside  Disposition Plan: return to SNF  Consults called: none  Admission status: Inpatient, Med-Surg   Kristopher Oppenheim, DO [facility] 05/13/2021, 9:14 PM

## 2021-05-13 NOTE — Progress Notes (Signed)
Pharmacy Antibiotic Note  Michael Hines is a 75 y.o. male admitted on 05/13/2021 with sepsis.  Pharmacy has been consulted for vancomycin and cefepime dosing.  WBC 11.3, afebrile, SCr 1.67 (BL<1).   Plan: Start vancomycin 1750 mg IV once as LD Follow with vancomycin 750 mg IV q24h MD      eAUC 472.9, Cmin 13.0 using SCr 1.67 and Vd 0.5 Start cefepime 2 g IV q8h Planning 7 DOT for now.  Follow labs, cultures, and clinical improvement as appropriate.    Height: 5\' 7"  (170.2 cm) Weight: 90.7 kg (200 lb) IBW/kg (Calculated) : 66.1  Temp (24hrs), Avg:95 F (35 C), Min:95 F (35 C), Max:95 F (35 C)  No results for input(s): WBC, CREATININE, LATICACIDVEN, VANCOTROUGH, VANCOPEAK, VANCORANDOM, GENTTROUGH, GENTPEAK, GENTRANDOM, TOBRATROUGH, TOBRAPEAK, TOBRARND, AMIKACINPEAK, AMIKACINTROU, AMIKACIN in the last 168 hours.  CrCl cannot be calculated (Patient's most recent lab result is older than the maximum 21 days allowed.).    No Known Allergies  Antimicrobials this admission: Vancomycin 10/3>> Cefepime 10/3>>(10/9)  Microbiology results: 10/3 BCx x2: ordered  Thank you for allowing pharmacy to be a part of this patient's care.  [name] 05/13/2021 5:06 PM

## 2021-05-13 NOTE — Sepsis Progress Note (Signed)
eLink monitoring code sepsis.  

## 2021-05-13 NOTE — ED Provider Notes (Signed)
[facility] EMERGENCY DEPARTMENT Provider Note   CSN: [id number] Arrival date & time: 05/13/21  1636     History Chief Complaint  Patient presents with   Tachycardia    CAMERYN CHRISLEY is a 75 y.o. male.  Presented to the emergency room with concern for sepsis.  Wife and patient provide history.  Additional history obtained from chart review.  Patient currently being treated with IV antibiotics via PICC line for MSSA bacteremia.  Had appointment with infectious disease today.  Wife states that patient has had some intermittent confusion lately.  Currently not confused.  Seem to be having a bad day today.  Very lethargic.  In the clinic infectious disease doctor reported that patient was confused, tachycardic, tachypneic, hypothermic and hypotensive.  Concern for possible septic shock.  Sent to ER for blood cultures, volume resuscitation, broad IV antibiotics.  Patient denies any chest pain or difficulty in breathing.   Reviewed chart, recent discharge summary on 9/09 and ID note from today.   HPI     Past Medical History:  Diagnosis Date   Anxiety    B12 deficiency    Back pain    Diabetes mellitus without complication ([place])    Hyperlipidemia    Hypotension 05/13/2021   Hypothermia 05/13/2021   Severe sepsis with septic shock ([place]) 05/13/2021   Tachycardia 05/13/2021   Tachypnea 05/13/2021   Testosterone insufficiency     Patient Active Problem List   Diagnosis Date Noted   Tachycardia 05/13/2021   Tachypnea 05/13/2021   Hypotension 05/13/2021   Hypothermia 05/13/2021   Sepsis with acute organ dysfunction ([place]) 05/13/2021   AKI (acute kidney injury) ([place]) 05/13/2021   Sepsis with acute organ dysfunction without septic shock ([place]) 05/13/2021   Acute respiratory failure with hypoxia (H[facility])    S/P AVR    Parkinson's disease ([place])    Diabetic polyneuropathy associated with type 2 diabetes mellitus ([place])    Chronic bilateral low back pain without sciatica     COPD exacerbation ([place]) 04/12/2021   Thoracic aortic aneurysm without rupture 09/14/2019   Delirium 09/09/2018   Hypokalemia 09/09/2018   MSSA bacteremia    Diabetes mellitus without complication ([place]) 32/20/2542   Polycythemia 09/01/2018   Abnormal LFTs 09/01/2018   Hypophosphatemia 09/01/2018   Altered mental status 09/01/2018    Past Surgical History:  Procedure Laterality Date   AORTIC VALVE REPLACEMENT  2013   redo bioprosthetic valve at [facility], Dr Mauricio Po   CHOLECYSTECTOMY     STRABISMUS SURGERY     TEE WITHOUT CARDIOVERSION N/A 09/07/2018   Procedure: TRANSESOPHAGEAL ECHOCARDIOGRAM (TEE);  Surgeon: Acie Fredrickson Wonda Cheng, MD;  Location: [facility] ENDOSCOPY;  Service: Cardiovascular;  Laterality: N/A;   TEE WITHOUT CARDIOVERSION N/A 04/18/2021   Procedure: TRANSESOPHAGEAL ECHOCARDIOGRAM (TEE);  Surgeon: Geralynn Rile, MD;  Location: [facility] ENDOSCOPY;  Service: Cardiovascular;  Laterality: N/A;       Family History  Problem Relation Age of Onset   Hypertension Mother    Diabetes Mother     Social History   Tobacco Use   Smoking status: Former    Packs/day: 1.50    Years: 55.00    Pack years: 82.50    Types: Cigarettes    Quit date: 08/24/2018    Years since quitting: 2.7   Smokeless tobacco: Former  Scientific laboratory technician Use: Never used  Substance Use Topics   Alcohol use: Not Currently   Drug use: Never    Home Medications Prior to  Admission medications   Medication Sig Start Date End Date Taking? Authorizing Provider  budesonide-formoterol (SYMBICORT) 80-4.5 MCG/ACT inhaler Inhale 2 puffs into the lungs 2 (two) times daily.    [provider]  buPROPion (WELLBUTRIN XL) 300 MG 24 hr tablet Take 300 mg by mouth daily. 07/04/19   [provider]  carbidopa-levodopa (SINEMET IR) 25-100 MG tablet Take 1 tablet by mouth 2 (two) times daily. 01/03/20   [provider]  ceFAZolin (ANCEF) IVPB Inject 2 g into the vein every 8 (eight) hours.  Indication:  MSSA bacteremia First Dose: Yes Last Day of Therapy:  05/13/21 Labs - Once weekly:  CBC/D and BMP, Labs - Every other week:  ESR and CRP Method of administration: IV Push Method of administration may be changed at the discretion of home infusion pharmacist based upon assessment of the patient and/or caregiver's ability to self-administer the medication ordered. 04/19/21   Geradine Girt, DO  cyanocobalamin (,VITAMIN B-12,) 1000 MCG/ML injection Inject 1,000 mcg into the muscle every 30 (thirty) days.  08/17/18   [provider]  HYDROcodone-acetaminophen (NORCO) 10-325 MG tablet Take 1 tablet by mouth every 6 (six) hours as needed. 04/19/21   Geradine Girt, DO  insulin aspart (NOVOLOG) 100 UNIT/ML injection Inject 0-15 Units into the skin 3 (three) times daily with meals. 04/19/21   Geradine Girt, DO  ipratropium-albuterol (DUONEB) 0.5-2.5 (3) MG/3ML SOLN Take 3 mLs by nebulization every 6 (six) hours as needed. 04/19/21   Geradine Girt, DO  loperamide (IMODIUM) 2 MG capsule Take 1 capsule (2 mg total) by mouth as needed for diarrhea or loose stools. 09/07/18   Kayleen Memos, DO    Allergies    Patient has no known allergies.  Review of Systems   Review of Systems  Constitutional:  Positive for fatigue. Negative for chills and fever.  HENT:  Negative for ear pain and sore throat.   Eyes:  Negative for pain and visual disturbance.  Respiratory:  Negative for cough and shortness of breath.   Cardiovascular:  Negative for chest pain and palpitations.  Gastrointestinal:  Negative for abdominal pain and vomiting.  Genitourinary:  Negative for dysuria and hematuria.  Musculoskeletal:  Negative for arthralgias and back pain.  Skin:  Negative for color change and rash.  Neurological:  Positive for weakness. Negative for seizures and syncope.  All other systems reviewed and are negative.  Physical Exam Updated Vital Signs BP (!) 141/85   Pulse 72   Temp 99.1 F (37.3 C)  (Oral)   Resp 20   Ht _0  (1.702 m)   Wt 90.7 kg   SpO2 94%   BMI 31.32 kg/m   Physical Exam Vitals and nursing note reviewed.  Constitutional:      Appearance: He is well-developed.  HENT:     Head: Normocephalic and atraumatic.  Eyes:     Conjunctiva/sclera: Conjunctivae normal.  Cardiovascular:     Rate and Rhythm: Normal rate and regular rhythm.     Heart sounds: No murmur heard. Pulmonary:     Effort: Pulmonary effort is normal. No respiratory distress.     Breath sounds: Normal breath sounds.  Abdominal:     Palpations: Abdomen is soft.     Tenderness: There is no abdominal tenderness.  Musculoskeletal:        General: No deformity or signs of injury.     Cervical back: Neck supple.  Skin:    General: Skin is warm and  dry.  Neurological:     General: No focal deficit present.     Mental Status: He is alert.  Psychiatric:        Mood and Affect: Mood normal.        Behavior: Behavior normal.    ED Results / Procedures / Treatments   Labs (all labs ordered are listed, but only abnormal results are displayed) Labs Reviewed  LACTIC ACID, PLASMA - Abnormal; Notable for the following components:      Result Value   Lactic Acid, Venous 2.0 (*)    All other components within normal limits  COMPREHENSIVE METABOLIC PANEL - Abnormal; Notable for the following components:   Potassium 3.2 (*)    CO2 18 (*)    Glucose, Bld 132 (*)    Creatinine, Ser 1.67 (*)    Calcium 8.8 (*)    Total Protein 6.3 (*)    Albumin 2.9 (*)    Total Bilirubin 1.3 (*)    GFR, Estimated 43 (*)    Anion gap 16 (*)    All other components within normal limits  CBC WITH DIFFERENTIAL/PLATELET - Abnormal; Notable for the following components:   WBC 11.3 (*)    Neutro Abs 9.4 (*)    Lymphs Abs 0.5 (*)    Monocytes Absolute 1.1 (*)    All other components within normal limits  URINALYSIS, ROUTINE W REFLEX MICROSCOPIC - Abnormal; Notable for the following components:   APPearance HAZY (*)     Hgb urine dipstick SMALL (*)    Ketones, ur 20 (*)    Protein, ur 30 (*)    Bacteria, UA RARE (*)    All other components within normal limits  D-DIMER, QUANTITATIVE - Abnormal; Notable for the following components:   D-Dimer, Quant 1.20 (*)    All other components within normal limits  RESP PANEL BY RT-PCR (FLU A&B, COVID) ARPGX2  CULTURE, BLOOD (ROUTINE X 2)  CULTURE, BLOOD (ROUTINE X 2)  LACTIC ACID, PLASMA   EKG None  Radiology DG Chest Port 1 View  Result Date: 05/13/2021 CLINICAL DATA:  Diaphoretic tachycardia EXAM: PORTABLE CHEST 1 VIEW COMPARISON:  04/12/2021 FINDINGS: Right upper extremity central venous catheter tip over the SVC. Post sternotomy changes. Cardiomegaly without focal airspace disease, effusion or pneumothorax. Small calcified lung nodules on right. Aortic atherosclerosis IMPRESSION: Cardiomegaly without focal airspace disease Electronically Signed   By: Donavan Foil M.D.   On: 05/13/2021 17:40    Procedures Procedures   Medications Ordered in ED Medications  lactated ringers infusion (has no administration in time range)  ceFEPIme (MAXIPIME) 2 g in sodium chloride 0.9 % 100 mL IVPB (has no administration in time range)  vancomycin (VANCOREADY) IVPB 750 mg/150 mL (has no administration in time range)  clotrimazole (LOTRIMIN) 1 % cream (has no administration in time range)  lactated ringers bolus 1,000 mL (0 mLs Intravenous Stopped 05/13/21 2009)    And  lactated ringers bolus 1,000 mL (0 mLs Intravenous Stopped 05/13/21 1903)    And  lactated ringers bolus 1,000 mL (1,000 mLs Intravenous New Bag/Given 05/13/21 2015)  ceFEPIme (MAXIPIME) 2 g in sodium chloride 0.9 % 100 mL IVPB (0 g Intravenous Stopped 05/13/21 1903)  vancomycin (VANCOREADY) IVPB 1750 mg/350 mL (0 mg Intravenous Stopped 05/13/21 2103)    ED Course  I have reviewed the triage vital signs and the nursing notes.  Pertinent labs & imaging results that were available during my care of the  patient were reviewed by me  and considered in my medical decision making (see chart for details).    MDM Rules/Calculators/A&P                           75 year old male presents to ER with concern for possible sepsis.  Recent MSSA bacteremia, PICC line in place.  Was confused, tachycardic and hypotensive and hypothermic at ID clinic today.  In ER, patient is only borderline tachycardic but not hypotensive.  Patient and wife endorse episodes of confusion but no confusion at present.  Is mildly hypothermic.  Given his recent medical history, I agree with treating for possible sepsis.  Started broad-spectrum antibiotics with Vanco and cefepime per ID recommendation and 30 cc/kg fluid bolus.  Basic labs stable. Mild leukocytosis, slight elevation in lactate. No limb swelling and denies any CP or SOB at present. Lower clinical suspicion for DVT/PE at present. Discussed with Bridgett Larsson with [facility] who will admit. He recommended sending a dimer which he will follow up.     Final Clinical Impression(s) / ED Diagnoses Final diagnoses:  Sepsis, due to unspecified organism, unspecified whether acute organ dysfunction present ([place])  Leukocytosis, unspecified type    Rx / DC Orders ED Discharge Orders     None        Lucrezia Starch, MD 05/13/21 2208

## 2021-05-13 NOTE — Progress Notes (Addendum)
Subjective:  Chief complaint: I feel hot all the time  Patient ID: Michael Hines, male    DOB: 1946/05/13, 75 y.o.   MRN: [id number]  HPI  Michael Hines is a 75 year old man with history of diabetes mellitus parkinsonism with frequent falls and what seems to be dementia now history of aortic valve replacement status post redo in 2013 admitted with sepsis due to MSSA bacteremia.  He cleared his cultures fairly rapidly.  TTE did not show vegetations nor did TEE.  MRI of the spine did not show evidence of deep infection there either.  I saw him in the hospital was a long hospital and he has now ready to complete 4 weeks of antibiotics with a stop date being today May 13, 2021.  Fortunately his skilled nurse facility delivered him to our clinic 1/2-hour late for his appointment rather than 1/2-hour early as was instructed.  He tells me that "I am burning up "but does not have other specific complaints.  His wife who accompanies him today says that today is when the first time that he got up and moved about and that this is caused him to be quite fatigued.  He appears more confused today in the clinic than I remember him being in the hospital   When we tried to obtain vital signs we could not obtain a blood pressure through the automatic blood pressure cuff his pulse was in the 130s.  When I came back to see him again he was becoming diaphoretic and appeared more uncomfortable.  We found ultimately that he had a blood pressure over the 120s over 80s but pulse was running 130s consistently and temperature was 92 and then 95. POC was 133.  Past Medical History:  Diagnosis Date   Anxiety    B12 deficiency    Back pain    Diabetes mellitus without complication ([place])    Hyperlipidemia    Testosterone insufficiency     Past Surgical History:  Procedure Laterality Date   AORTIC VALVE REPLACEMENT  2013   redo bioprosthetic valve at [facility], Dr Mauricio Po   CHOLECYSTECTOMY     STRABISMUS SURGERY      TEE WITHOUT CARDIOVERSION N/A 09/07/2018   Procedure: TRANSESOPHAGEAL ECHOCARDIOGRAM (TEE);  Surgeon: Acie Fredrickson Wonda Cheng, MD;  Location: [facility] ENDOSCOPY;  Service: Cardiovascular;  Laterality: N/A;   TEE WITHOUT CARDIOVERSION N/A 04/18/2021   Procedure: TRANSESOPHAGEAL ECHOCARDIOGRAM (TEE);  Surgeon: Geralynn Rile, MD;  Location: [place];  Service: Cardiovascular;  Laterality: N/A;    Family History  Problem Relation Age of Onset   Hypertension Mother    Diabetes Mother       Social History   Socioeconomic History   Marital status: Married    Spouse name: Not on file   Number of children: Not on file   Years of education: Not on file   Highest education level: Not on file  Occupational History   Not on file  Tobacco Use   Smoking status: Former    Packs/day: 1.50    Years: 55.00    Pack years: 82.50    Types: Cigarettes    Quit date: 08/24/2018    Years since quitting: 2.7   Smokeless tobacco: Former  Scientific laboratory technician Use: Never used  Substance and Sexual Activity   Alcohol use: Not Currently   Drug use: Never   Sexual activity: Not on file  Other Topics Concern   Not on file  Social History Narrative  Not on file   Social Determinants of Health   Financial Resource Strain: Not on file  Food Insecurity: Not on file  Transportation Needs: Not on file  Physical Activity: Not on file  Stress: Not on file  Social Connections: Not on file    No Known Allergies   Current Outpatient Medications:    budesonide-formoterol (SYMBICORT) 80-4.5 MCG/ACT inhaler, Inhale 2 puffs into the lungs 2 (two) times daily., Disp: , Rfl:    buPROPion (WELLBUTRIN XL) 300 MG 24 hr tablet, Take 300 mg by mouth daily., Disp: , Rfl:    carbidopa-levodopa (SINEMET IR) 25-100 MG tablet, Take 1 tablet by mouth 2 (two) times daily., Disp: , Rfl:    ceFAZolin (ANCEF) IVPB, Inject 2 g into the vein every 8 (eight) hours. Indication:  MSSA bacteremia First Dose: Yes Last Day of  Therapy:  05/13/21 Labs - Once weekly:  CBC/D and BMP, Labs - Every other week:  ESR and CRP Method of administration: IV Push Method of administration may be changed at the discretion of home infusion pharmacist based upon assessment of the patient and/or caregiver's ability to self-administer the medication ordered., Disp: 78 Units, Rfl: 0   cyanocobalamin (,VITAMIN B-12,) 1000 MCG/ML injection, Inject 1,000 mcg into the muscle every 30 (thirty) days. , Disp: , Rfl:    HYDROcodone-acetaminophen (NORCO) 10-325 MG tablet, Take 1 tablet by mouth every 6 (six) hours as needed., Disp: 4 tablet, Rfl: 0   insulin aspart (NOVOLOG) 100 UNIT/ML injection, Inject 0-15 Units into the skin 3 (three) times daily with meals., Disp: 10 mL, Rfl: 11   ipratropium-albuterol (DUONEB) 0.5-2.5 (3) MG/3ML SOLN, Take 3 mLs by nebulization every 6 (six) hours as needed., Disp: 360 mL, Rfl:    loperamide (IMODIUM) 2 MG capsule, Take 1 capsule (2 mg total) by mouth as needed for diarrhea or loose stools., Disp: 30 capsule, Rfl: 0  Review of Systems  Unable to perform ROS: Dementia      Objective:   Physical Exam Constitutional:      Appearance: He is well-developed.  HENT:     Head: Normocephalic and atraumatic.  Eyes:     Conjunctiva/sclera: Conjunctivae normal.  Cardiovascular:     Rate and Rhythm: Tachycardia present. Rhythm irregular.     Heart sounds: No murmur heard.   No friction rub. No gallop.  Pulmonary:     Effort: Tachypnea present. No respiratory distress.     Breath sounds: No stridor. No wheezing or rhonchi.  Abdominal:     General: There is no distension.     Palpations: Abdomen is soft.  Musculoskeletal:        General: No tenderness. Normal range of motion.     Cervical back: Normal range of motion and neck supple.  Skin:    General: Skin is warm and dry.     Coloration: Skin is pale.     Findings: Bruising present. No erythema or rash.  Neurological:     General: No focal deficit  present.     Mental Status: He is alert. He is disoriented.  Psychiatric:        Mood and Affect: Mood normal.        Thought Content: Thought content normal.        Cognition and Memory: Cognition is impaired. Memory is impaired. He exhibits impaired recent memory and impaired remote memory.        Judgment: Judgment normal.     Tremor with grip bilaterally and moving  legs,      Assessment & Plan:   Suspect septic shock: He is confused tachycardic tachypneic hypothermic and hypotensive.   I am concerned he could have a PICC line associated bloodstream infection or he could potentially have a DVT associated with his PICC line but I suspect sepsis from line the most likely source  He needs to go to the ER  He should have stat blood cultures done and have volume resuscitation and initiation of IV antibiotics (would use vancomycin, cefepime)   work-up imaging including work-up for DVT, PE  We are calling 911 to arrange for him to go to ER immediately.   MSSA bacteremia with sepsis: He should have received adequate treatment for this by now.  As mentioned before his TEE was negative for endocarditis and he had no specific signs of infection elsewhere  Once his acute condition has normalized I be happy to see him back again and check some surveillance blood culture   Parkinsonism: he has likely some degree of dementia but he appears MUCH more confused and ill than when I saw him in the inpatient world.  48 minutes with the patient including face to face counseling of the patient  and his wife re his MSSA bactermia, personally reviewing MRI of Lumbar spine, TEE along  review of medical records before and during the visit and in coordination of his care.

## 2021-05-13 NOTE — Assessment & Plan Note (Signed)
Discharge Scr was 0.9.  Continue with IVF. Repeat CMP in AM.

## 2021-05-13 NOTE — ED Notes (Signed)
Admitting provider bedside 

## 2021-05-13 NOTE — ED Notes (Signed)
Pt's wife informed RN that pt has what looks like yeast on his penis which he does.  RN sent urine and informed provider.

## 2021-05-13 NOTE — Assessment & Plan Note (Signed)
Resolved

## 2021-05-13 NOTE — Assessment & Plan Note (Signed)
Stable

## 2021-05-13 NOTE — Assessment & Plan Note (Signed)
Continue SSI. °

## 2021-05-13 NOTE — Assessment & Plan Note (Addendum)
Continue SSI. °

## 2021-05-13 NOTE — Assessment & Plan Note (Addendum)
Admit To MedSurg bed.  Patient treated with 30 cc/kg of IV fluids.  Patient's hypotension has resolved.  Wife states patient appears much improved since this afternoon.  ID wanted patient be treated with cefepime and vancomycin.  We will continue this.  ID consult in the morning.  Patient's PICC line may need to be removed and the tip sent for culture.  Pt and wife are aware that if the patient develops MSSA septicemia given, he will need another complete work-up for the source. Sepsis with acute organ dysfunction due potential infection with AKI and lactic acidosis.

## 2021-05-13 NOTE — ED Triage Notes (Signed)
Patient brought in via ems from infection control. Patient was diaphoretic and tachy at his facility receiving antibiotics through PICC line. Patient A&Ox4 VSS

## 2021-05-14 DIAGNOSIS — M545 Low back pain, unspecified: Secondary | ICD-10-CM | POA: Diagnosis not present

## 2021-05-14 DIAGNOSIS — T68XXXA Hypothermia, initial encounter: Secondary | ICD-10-CM

## 2021-05-14 DIAGNOSIS — E1142 Type 2 diabetes mellitus with diabetic polyneuropathy: Secondary | ICD-10-CM | POA: Diagnosis not present

## 2021-05-14 DIAGNOSIS — N179 Acute kidney failure, unspecified: Secondary | ICD-10-CM | POA: Diagnosis not present

## 2021-05-14 DIAGNOSIS — A419 Sepsis, unspecified organism: Secondary | ICD-10-CM | POA: Diagnosis not present

## 2021-05-14 LAB — CBC WITH DIFFERENTIAL/PLATELET
Abs Immature Granulocytes: 0.38 10*3/uL — ABNORMAL HIGH (ref 0.00–0.07)
Basophils Absolute: 0.1 10*3/uL (ref 0.0–0.1)
Basophils Relative: 1 %
Eosinophils Absolute: 0.5 10*3/uL (ref 0.0–0.5)
Eosinophils Relative: 5 %
HCT: 42.2 % (ref 39.0–52.0)
Hemoglobin: 13.7 g/dL (ref 13.0–17.0)
Immature Granulocytes: 4 %
Lymphocytes Relative: 12 %
Lymphs Abs: 1.2 10*3/uL (ref 0.7–4.0)
MCH: 30.5 pg (ref 26.0–34.0)
MCHC: 32.5 g/dL (ref 30.0–36.0)
MCV: 94 fL (ref 80.0–100.0)
Monocytes Absolute: 0.8 10*3/uL (ref 0.1–1.0)
Monocytes Relative: 9 %
Neutro Abs: 6.4 10*3/uL (ref 1.7–7.7)
Neutrophils Relative %: 69 %
Platelets: 202 10*3/uL (ref 150–400)
RBC: 4.49 MIL/uL (ref 4.22–5.81)
RDW: 14.9 % (ref 11.5–15.5)
WBC: 9.4 10*3/uL (ref 4.0–10.5)
nRBC: 0 % (ref 0.0–0.2)

## 2021-05-14 LAB — COMPREHENSIVE METABOLIC PANEL
ALT: 5 U/L (ref 0–44)
AST: 17 U/L (ref 15–41)
Albumin: 2.5 g/dL — ABNORMAL LOW (ref 3.5–5.0)
Alkaline Phosphatase: 80 U/L (ref 38–126)
Anion gap: 12 (ref 5–15)
BUN: 8 mg/dL (ref 8–23)
CO2: 21 mmol/L — ABNORMAL LOW (ref 22–32)
Calcium: 8.2 mg/dL — ABNORMAL LOW (ref 8.9–10.3)
Chloride: 104 mmol/L (ref 98–111)
Creatinine, Ser: 1.13 mg/dL (ref 0.61–1.24)
GFR, Estimated: >60 mL/min (ref >60)
Glucose, Bld: 99 mg/dL (ref 70–99)
Potassium: 3.7 mmol/L (ref 3.5–5.1)
Sodium: 137 mmol/L (ref 135–145)
Total Bilirubin: 1.1 mg/dL (ref 0.3–1.2)
Total Protein: 5.2 g/dL — ABNORMAL LOW (ref 6.5–8.1)

## 2021-05-14 LAB — PROCALCITONIN
Procalcitonin: 0.11 ng/mL
Procalcitonin: 0.21 ng/mL

## 2021-05-14 LAB — CBG MONITORING, ED
Glucose-Capillary: 101 mg/dL — ABNORMAL HIGH (ref 70–99)
Glucose-Capillary: 103 mg/dL — ABNORMAL HIGH (ref 70–99)
Glucose-Capillary: 103 mg/dL — ABNORMAL HIGH (ref 70–99)
Glucose-Capillary: 110 mg/dL — ABNORMAL HIGH (ref 70–99)
Glucose-Capillary: 110 mg/dL — ABNORMAL HIGH (ref 70–99)
Glucose-Capillary: 61 mg/dL — ABNORMAL LOW (ref 70–99)
Glucose-Capillary: 88 mg/dL (ref 70–99)

## 2021-05-14 LAB — GLUCOSE, POCT (MANUAL RESULT ENTRY): POC Glucose: 135 mg/dl — AB (ref 70–99)

## 2021-05-14 MED ORDER — GABAPENTIN 300 MG PO CAPS
600.0000 mg | ORAL_CAPSULE | Freq: Every evening | ORAL | Status: DC
  Administered 2021-05-14: 600 mg via ORAL
  Filled 2021-05-14: qty 6

## 2021-05-14 MED ORDER — DEXTROSE-NACL 5-0.9 % IV SOLN: INTRAVENOUS | Status: DC

## 2021-05-14 MED ORDER — SODIUM CHLORIDE 0.9 % IV SOLN
2.0000 g | Freq: Three times a day (TID) | INTRAVENOUS | Status: DC
  Administered 2021-05-14 – 2021-05-15 (×3): 2 g via INTRAVENOUS
  Filled 2021-05-14 (×3): qty 2

## 2021-05-14 MED ORDER — VANCOMYCIN HCL 1500 MG/300ML IV SOLN
1500.0000 mg | INTRAVENOUS | Status: DC
  Filled 2021-05-14: qty 300

## 2021-05-14 MED ORDER — GABAPENTIN 300 MG PO CAPS
300.0000 mg | ORAL_CAPSULE | Freq: Every morning | ORAL | Status: DC
  Filled 2021-05-14: qty 1

## 2021-05-14 MED ORDER — SODIUM CHLORIDE 0.9 % IV SOLN
2.0000 g | Freq: Two times a day (BID) | INTRAVENOUS | Status: DC
  Administered 2021-05-14: 2 g via INTRAVENOUS
  Filled 2021-05-14: qty 2

## 2021-05-14 NOTE — Progress Notes (Signed)
PT Cancellation Note  Patient Details Name: XAINE SANSOM MRN: [id number] DOB: January 31, 1946   Cancelled Treatment:    Reason Eval/Treat Not Completed: Other (comment);Patient at procedure or test/unavailable.  Pt was with another staff member, then on bedrest for procedure.  Retry at another time.   Ramond Dial 05/14/2021, 5:36 PM  Mee Hives, PT PhD Acute Rehab Dept. Number: [place] [facility] [place]

## 2021-05-14 NOTE — Progress Notes (Signed)
PROGRESS NOTE    Michael Hines  ZOX:[id number] DOB: 06/28/1946 DOA: 05/13/2021 PCP: Finis Bud, MD    Brief Narrative:  75 year old gentleman with history of COPD, type 2 diabetes, hyperlipidemia, bioprosthetic aortic valve replacement and recent MSSA septicemia status post 4 weeks of IV Ancef which he would have finished on 10/3 presented to ID clinic for follow-up and found to be confused than normal.  They were unable to check his blood pressure.  He was diaphoretic and heart rate was 130, temperature was 92 so he was sent to ER.  He was transferred to skilled nursing facility and finishing up IV antibiotic therapy.  He does have history of parkinsonism and currently on carbidopa levodopa.   Assessment & Plan:   Principal Problem:   Sepsis with acute organ dysfunction (HCC) Active Problems:   Diabetes mellitus without complication ([place])   S/P AVR   Diabetic polyneuropathy associated with type 2 diabetes mellitus (HCC)   Chronic bilateral low back pain without sciatica   Hypotension   Hypothermia   AKI (acute kidney injury) ([place])   Sepsis with acute organ dysfunction without septic shock (HCC)  Severe sepsis with acute organ dysfunction, present on admission. Presented with hypothermia, hypotension and confusion.  Primary source unknown. Resuscitated with 30 cc/kg of IV fluid.  Blood pressures improved. Blood cultures drawn.  Chest x-ray without evidence of infection.  Urinalysis is clear.  Patient just finishing cefepime.  Recent TEE with no vegetation. Started on vancomycin and cefepime and will be followed by infectious disease.  Type 2 diabetes: On insulin.  Continue sliding scale.  Has poor appetite.  Parkinson's disease, chronic bilateral low back pain without sciatica: Symptomatic treatment.  She is on as needed oxycodone.  Uses carbidopa-levodopa.  Resume gabapentin.  Altered mental status in a patient with underlying Parkinson's disorder: Probably due to  dehydration, sepsis present on admission.  Suspect acute encephalopathy due to infection. Work with PT OT.  Nonfocal exam.  DVT prophylaxis: enoxaparin (LOVENOX) injection 40 mg Start: 05/14/21 1000 SCDs Start: 05/13/21 2310   Code Status: Full code Family Communication: Wife at the bedside Disposition Plan: Status is: Inpatient  Remains inpatient appropriate because:Inpatient level of care appropriate due to severity of illness  Dispo: The patient is from: SNF              Anticipated d/c is to: SNF              Patient currently is not medically stable to d/c.   Difficult to place patient No         Consultants:  Infectious disease  Procedures:  None  Antimicrobials:  Vancomycin and cefepime 10/3---   Subjective: Patient seen and examined.  Wife was at bedside.  He is a still in the hallway in the emergency room since last night.  Patient tells me that he just does not feel well.  Afebrile overnight.  Blood pressures are adequately stabilized.  He has some pain on his legs but without any swelling or tenderness. Wife at the bedside concerned about him getting gradually debilitated, she tells me that he has been declining since last few years.  Recently has been less participating and more weak.  Objective: Vitals:   05/14/21 0340 05/14/21 0702 05/14/21 1105 05/14/21 1355  BP: (!) 138/91 (!) 147/88 (!) 162/85 120/90  Pulse: 71 (!) 58 (!) 55 67  Resp: 14 16 (!) 21 18  Temp: 98.9 F (37.2 C)   98.3 F (36.8  C)  TempSrc: Axillary   Oral  SpO2: 95% 93% 96% 94%  Weight:      Height:        Intake/Output Summary (Last 24 hours) at 05/14/2021 1424 Last data filed at 05/14/2021 0555 Gross per 24 hour  Intake 379.72 ml  Output 300 ml  Net 79.72 ml   Filed Weights   05/13/21 1648  Weight: 90.7 kg    Examination:  General exam: Appears calm and comfortable  Frail and debilitated.  Chronically sick looking gentleman lying in hospital stretcher.  On room  air. Patient is mostly sleepy.  On his stimulation he is awake and alert.  Can keep up some communication. He has flat affect and anxious mood. Respiratory system: Clear to auscultation. Respiratory effort normal.  No added sounds. Cardiovascular system: S1 & S2 heard, RRR.  Gastrointestinal system: Soft.  Nontender.  Bowel sounds present. Central nervous system: Alert and oriented. No focal neurological deficits. He has generalized weakness.  No focal deficits. Gait not checked because of fall risk. Patient has multiple bruises on both knees, joints without any swelling.   Data Reviewed: I have personally reviewed following labs and imaging studies  CBC: Recent Labs  Lab 05/13/21 1654 05/14/21 0630  WBC 11.3* 9.4  NEUTROABS 9.4* 6.4  HGB 16.3 13.7  HCT 51.0 42.2  MCV 95.9 94.0  PLT 244 481   Basic Metabolic Panel: Recent Labs  Lab 05/13/21 1654 05/14/21 0630  NA 135 137  K 3.2* 3.7  CL 101 104  CO2 18* 21*  GLUCOSE 132* 99  BUN 8 8  CREATININE 1.67* 1.13  CALCIUM 8.8* 8.2*   GFR: Estimated Creatinine Clearance: 62.5 mL/min (by C-G formula based on SCr of 1.13 mg/dL). Liver Function Tests: Recent Labs  Lab 05/13/21 1654 05/14/21 0630  AST 21 17  ALT <5 5  ALKPHOS 118 80  BILITOT 1.3* 1.1  PROT 6.3* 5.2*  ALBUMIN 2.9* 2.5*   No results for input(s): LIPASE, AMYLASE in the last 168 hours. No results for input(s): AMMONIA in the last 168 hours. Coagulation Profile: No results for input(s): INR, PROTIME in the last 168 hours. Cardiac Enzymes: No results for input(s): CKTOTAL, CKMB, CKMBINDEX, TROPONINI in the last 168 hours. BNP (last 3 results) No results for input(s): PROBNP in the last 8760 hours. HbA1C: No results for input(s): HGBA1C in the last 72 hours. CBG: Recent Labs  Lab 05/13/21 2359 05/14/21 0336 05/14/21 0705 05/14/21 1111 05/14/21 1209  GLUCAP 61* 103* 88 110* 101*   Lipid Profile: No results for input(s): CHOL, HDL, LDLCALC, TRIG,  CHOLHDL, LDLDIRECT in the last 72 hours. Thyroid Function Tests: No results for input(s): TSH, T4TOTAL, FREET4, T3FREE, THYROIDAB in the last 72 hours. Anemia Panel: No results for input(s): VITAMINB12, FOLATE, FERRITIN, TIBC, IRON, RETICCTPCT in the last 72 hours. Sepsis Labs: Recent Labs  Lab 05/13/21 1713 05/13/21 2025 05/14/21 0000 05/14/21 0630  PROCALCITON  --   --  0.11 0.21  LATICACIDVEN 2.0* 1.4  --   --     Recent Results (from the past 240 hour(s))  Blood Culture (routine x 2)     Status: None (Preliminary result)   Collection Time: 05/13/21  5:13 PM   Specimen: BLOOD  Result Value Ref Range Status   Specimen Description BLOOD LEFT ANTECUBITAL  Final   Special Requests   Final    BOTTLES DRAWN AEROBIC AND ANAEROBIC Blood Culture results may not be optimal due to an inadequate volume of blood  received in culture bottles   Culture   Final    NO GROWTH < 12 HOURS Performed at [facility], [place] [address] [place], [place] [address]    Report Status PENDING  Incomplete  Resp Panel by RT-PCR (Flu A&B, Covid) Nasopharyngeal Swab     Status: None   Collection Time: 05/13/21  5:22 PM   Specimen: Nasopharyngeal Swab; Nasopharyngeal(NP) swabs in vial transport medium  Result Value Ref Range Status   SARS Coronavirus 2 by RT PCR NEGATIVE NEGATIVE Final    Comment: (NOTE) SARS-CoV-2 target nucleic acids are NOT DETECTED.  The SARS-CoV-2 RNA is generally detectable in upper respiratory specimens during the acute phase of infection. The lowest concentration of SARS-CoV-2 viral copies this assay can detect is 138 copies/mL. A negative result does not preclude SARS-Cov-2 infection and should not be used as the sole basis for treatment or other patient management decisions. A negative result may occur with  improper specimen collection/handling, submission of specimen other than nasopharyngeal swab, presence of viral mutation(s) within the areas targeted by this assay,  and inadequate number of viral copies(<138 copies/mL). A negative result must be combined with clinical observations, patient history, and epidemiological information. The expected result is Negative.  Fact Sheet for Patients:  EntrepreneurPulse.com.au  Fact Sheet for Healthcare Providers:  IncredibleEmployment.be  This test is no t yet approved or cleared by the [place] FDA and  has been authorized for detection and/or diagnosis of SARS-CoV-2 by FDA under an Emergency Use Authorization (EUA). This EUA will remain  in effect (meaning this test can be used) for the duration of the COVID-19 declaration under Section 564(b)(1) of the Act, 21 U.S.C.section 360bbb-3(b)(1), unless the authorization is terminated  or revoked sooner.       Influenza A by PCR NEGATIVE NEGATIVE Final   Influenza B by PCR NEGATIVE NEGATIVE Final    Comment: (NOTE) The Xpert Xpress SARS-CoV-2/FLU/RSV plus assay is intended as an aid in the diagnosis of influenza from Nasopharyngeal swab specimens and should not be used as a sole basis for treatment. Nasal washings and aspirates are unacceptable for Xpert Xpress SARS-CoV-2/FLU/RSV testing.  Fact Sheet for Patients: EntrepreneurPulse.com.au  Fact Sheet for Healthcare Providers: IncredibleEmployment.be  This test is not yet approved or cleared by the [place] FDA and has been authorized for detection and/or diagnosis of SARS-CoV-2 by FDA under an Emergency Use Authorization (EUA). This EUA will remain in effect (meaning this test can be used) for the duration of the COVID-19 declaration under Section 564(b)(1) of the Act, 21 U.S.C. section 360bbb-3(b)(1), unless the authorization is terminated or revoked.  Performed at [facility], [place] [address], [place], [place] [address]   Blood Culture (routine x 2)     Status: None (Preliminary result)   Collection Time:  05/13/21  5:39 PM   Specimen: BLOOD  Result Value Ref Range Status   Specimen Description BLOOD RIGHT ANTECUBITAL  Final   Special Requests   Final    BOTTLES DRAWN AEROBIC AND ANAEROBIC Blood Culture results may not be optimal due to an inadequate volume of blood received in culture bottles   Culture   Final    NO GROWTH < 12 HOURS Performed at [facility], [place] [address], [place], [place] [address]    Report Status PENDING  Incomplete         Radiology Studies: DG Chest Port 1 View  Result Date: 05/13/2021 CLINICAL DATA:  Diaphoretic tachycardia EXAM:  PORTABLE CHEST 1 VIEW COMPARISON:  04/12/2021 FINDINGS: Right upper extremity central venous catheter tip over the SVC. Post sternotomy changes. Cardiomegaly without focal airspace disease, effusion or pneumothorax. Small calcified lung nodules on right. Aortic atherosclerosis IMPRESSION: Cardiomegaly without focal airspace disease Electronically Signed   By: Donavan Foil M.D.   On: 05/13/2021 17:40        Scheduled Meds:  buPROPion  300 mg Oral Daily   carbidopa-levodopa  1 tablet Oral BID   clotrimazole   Topical BID   enoxaparin (LOVENOX) injection  40 mg Subcutaneous Q24H   fluticasone furoate-vilanterol  1 puff Inhalation Daily   [START ON 05/15/2021] gabapentin  300 mg Oral q AM   gabapentin  600 mg Oral QPM   insulin aspart  0-15 Units Subcutaneous TID WC   insulin aspart  0-5 Units Subcutaneous QHS   Continuous Infusions:  ceFEPime (MAXIPIME) IV 2 g (05/14/21 1353)   dextrose 5 % and 0.9% NaCl 75 mL/hr at 05/14/21 0937     LOS: 1 day    Time spent: 32 minutes    Barb Merino, MD [facility] Pager [phone]

## 2021-05-14 NOTE — Progress Notes (Signed)
HOB was lowered to have pt in supine position. Patient, his brother, and the ED RN were informed that [facility] must remain less than 45 degrees and that patient must remain in bed for 30 minutes post removal. Pressure was held to site for 5 minutes upon removal and a pressure dressing was applied to site. Pt was instructed to report bleeding or SOB to his RN.

## 2021-05-14 NOTE — ED Notes (Signed)
Pt was cleaned and changed after having BM in bed.

## 2021-05-14 NOTE — Progress Notes (Signed)
RUA PICC line removed by this RN. Catheter intact on removal and measured 38cm. Insertion site to RUA is free from redness, swelling, drainage, or tenderness. Dressing was peeling off and secure lock was disconnected from adhesive base prior to removal.

## 2021-05-14 NOTE — ED Notes (Signed)
Pt was offered his meal tray which was placed by his bed, but refuses to eat at this time.

## 2021-05-14 NOTE — ED Notes (Signed)
I introduced myself to pt and spouse. Pt sleeping currently. Pt's BG 61 and pt is hard to keep awake. Admitting doctor paged. Wife says pt has been intermittently confused for the past week, but has stayed more confused over the last couple of days.

## 2021-05-14 NOTE — Progress Notes (Signed)
PT Cancellation Note  Patient Details Name: Michael Hines MRN: [id number] DOB: Jan 31, 1946   Cancelled Treatment:    Reason Eval/Treat Not Completed: Other (comment).  Refused over fatigue, retry as time and pt allow.   Ramond Dial 05/14/2021, 12:17 PM  Mee Hives, PT MS Acute Rehab Dept. Number: [place] [facility] [place]

## 2021-05-14 NOTE — ED Notes (Signed)
Report given to Britany, RN.

## 2021-05-14 NOTE — Consult Note (Signed)
[facility]  Total days of antibiotics 2        Day 2 vancomycin        Day 2 cefepime              Reason for Consult: Previous MSSA septicemia; possible sepsis with acute organ dysfunction    Referring Physician: Barb Merino, MD  Principal Problem:   Sepsis with acute organ dysfunction [facility]) Active Problems:   Diabetes mellitus without complication ([place])   S/P AVR   Diabetic polyneuropathy associated with type 2 diabetes mellitus ([place])   Chronic bilateral low back pain without sciatica   Hypotension   Hypothermia   AKI (acute kidney injury) ([place])   Sepsis with acute organ dysfunction without septic shock [facility])    HPI: Michael Hines is a 75 y.o. male with COPD, T2DM, HLD, bioprosthetic aortic valve replacement, and previous MSSA septicemia s/p 4 weeks of IV ancef (would have been completed on 10/3). He presented to the hospital from the ID clinic after complaints of "burning up" and feeling more confused than normal. In the clinic, they were unable to obtain a BP and he was diaphoretic. Pulse was in the 130s and temp was 92, then 95. Initially there was concern for PICC line associated bloodstream infection vs DVT associated with his PICC line and he was sent to the ER via EMS. In the ER, patient was hypotensive and hypothermic and underwent code sepsis protocol. Since his recent discharge from the hospital, he has been residing at [facility] up his IV abx therapy and undergoing PT. The patient continues to endorse back pain and has not been able to undergo his usual ESI injections for the past 8 weeks (usually has them every 4-6 weeks). Over the last few days, tthe patient also notes that he has had intermittent nausea and vomiting, and has had a decrease in his appetite ever since his hospitalizations. Denies any vomiting since admission and has had no abdominal pain or diarrhea. He has generally felt weak since his initial  hospitalization and notes that he has not worked with PT much since he has been at his SNF.  The patient states that he feels about the same today. He continues to have nausea and a decrease in PO intake. Continues to endorse low back pain and just generally feels fatigued and weak. He also notes that he noticed a white rash on his penis which he believes first appeared yesterday. He has no pain at the site and denies any itching. Denies any dysuria, hematuria, or any other urinary sxs at this time.  Past Medical History:  Diagnosis Date   Anxiety    B12 deficiency    Back pain    Diabetes mellitus without complication (HCC)    Hyperlipidemia    Hypotension 05/13/2021   Hypothermia 05/13/2021   Severe sepsis with septic shock ([place]) 05/13/2021   Tachycardia 05/13/2021   Tachypnea 05/13/2021   Testosterone insufficiency     Allergies: No Known Allergies  Current antibiotics: Vanc and Cefepime   MEDICATIONS:  buPROPion  300 mg Oral Daily   carbidopa-levodopa  1 tablet Oral BID   clotrimazole   Topical BID   enoxaparin (LOVENOX) injection  40 mg Subcutaneous Q24H   fluticasone furoate-vilanterol  1 puff Inhalation Daily   insulin aspart  0-15 Units Subcutaneous TID WC   insulin aspart  0-5 Units Subcutaneous QHS    Social History   Tobacco  Use   Smoking status: Former    Packs/day: 1.50    Years: 55.00    Pack years: 82.50    Types: Cigarettes    Quit date: 08/24/2018    Years since quitting: 2.7   Smokeless tobacco: Former  Scientific laboratory technician Use: Never used  Substance Use Topics   Alcohol use: Not Currently   Drug use: Never    Family History  Problem Relation Age of Onset   Hypertension Mother    Diabetes Mother     Review of Systems - Review of Systems  Constitutional:  Positive for chills, diaphoresis and malaise/fatigue. Negative for fever.  HENT: Negative.    Eyes:  Negative for blurred vision and double vision.  Respiratory:  Negative for cough and  shortness of breath.   Cardiovascular:  Negative for chest pain and palpitations.  Gastrointestinal:  Positive for nausea. Negative for abdominal pain, blood in stool, diarrhea, melena and vomiting.  Genitourinary:  Negative for dysuria and urgency.       White patches noted on penis, not itchy or painful  Musculoskeletal:  Positive for back pain.  Skin:  Negative for itching.  Neurological:  Positive for weakness. Negative for dizziness and headaches.     OBJECTIVE: Temp:  [95 F (35 C)-99.1 F (37.3 C)] 98.9 F (37.2 C) (10/04 0340) Pulse Rate:  [58-178] 58 (10/04 0702) Resp:  [14-24] 16 (10/04 0702) BP: (98-165)/(75-106) 147/88 (10/04 0702) SpO2:  [91 %-96 %] 93 % (10/04 0702) Weight:  [90.7 kg] 90.7 kg (10/03 1648) Physical Exam Constitutional:      Appearance: He is ill-appearing.  Eyes:     Extraocular Movements: Extraocular movements intact.  Cardiovascular:     Rate and Rhythm: Normal rate and regular rhythm.     Pulses: Normal pulses.     Heart sounds: No murmur heard. Pulmonary:     Effort: Pulmonary effort is normal.     Breath sounds: Normal breath sounds.  Abdominal:     General: There is no distension.     Palpations: Abdomen is soft.     Tenderness: There is no abdominal tenderness.  Musculoskeletal:     Right lower leg: No edema.     Left lower leg: No edema.     Comments: RUE picc line still in place, no erythema or drainage noted  Skin:    Findings: Bruising present.  Neurological:     Mental Status: He is alert and oriented to person, place, and time.     Cranial Nerves: No cranial nerve deficit.     LABS: Results for orders placed or performed during the hospital encounter of 05/13/21 (from the past 48 hour(s))  Comprehensive metabolic panel     Status: Abnormal   Collection Time: 05/13/21  4:54 PM  Result Value Ref Range   Sodium 135 135 - 145 mmol/L   Potassium 3.2 (L) 3.5 - 5.1 mmol/L   Chloride 101 98 - 111 mmol/L   CO2 18 (L) 22 - 32  mmol/L   Glucose, Bld 132 (H) 70 - 99 mg/dL    Comment: Glucose reference range applies only to samples taken after fasting for at least 8 hours.   BUN 8 8 - 23 mg/dL   Creatinine, Ser 1.67 (H) 0.61 - 1.24 mg/dL   Calcium 8.8 (L) 8.9 - 10.3 mg/dL   Total Protein 6.3 (L) 6.5 - 8.1 g/dL   Albumin 2.9 (L) 3.5 - 5.0 g/dL   AST 21 15 -  41 U/L   ALT <5 0 - 44 U/L   Alkaline Phosphatase 118 38 - 126 U/L   Total Bilirubin 1.3 (H) 0.3 - 1.2 mg/dL   GFR, Estimated 43 (L) >60 mL/min    Comment: (NOTE) Calculated using the CKD-EPI Creatinine Equation (2021)    Anion gap 16 (H) 5 - 15    Comment: Performed at [facility] [address], [place], [place] [address]  CBC WITH DIFFERENTIAL     Status: Abnormal   Collection Time: 05/13/21  4:54 PM  Result Value Ref Range   WBC 11.3 (H) 4.0 - 10.5 K/uL   RBC 5.32 4.22 - 5.81 MIL/uL   Hemoglobin 16.3 13.0 - 17.0 g/dL   HCT 51.0 39.0 - 52.0 %   MCV 95.9 80.0 - 100.0 fL   MCH 30.6 26.0 - 34.0 pg   MCHC 32.0 30.0 - 36.0 g/dL   RDW 15.0 11.5 - 15.5 %   Platelets 244 150 - 400 K/uL   nRBC 0.0 0.0 - 0.2 %   Neutrophils Relative % 83 %   Neutro Abs 9.4 (H) 1.7 - 7.7 K/uL   Lymphocytes Relative 4 %   Lymphs Abs 0.5 (L) 0.7 - 4.0 K/uL   Monocytes Relative 10 %   Monocytes Absolute 1.1 (H) 0.1 - 1.0 K/uL   Eosinophils Relative 3 %   Eosinophils Absolute 0.3 0.0 - 0.5 K/uL   Basophils Relative 0 %   Basophils Absolute 0.0 0.0 - 0.1 K/uL   WBC Morphology See Note     Comment: Mild Left Shift. 1 to 5% Metas and Myelos, Occ Pro Noted.   nRBC 0 0 /100 WBC   Abs Immature Granulocytes 0.00 0.00 - 0.07 K/uL    Comment: Performed at [facility], [place] [address], [place], [place] [address]  Urinalysis, Routine w reflex microscopic Urine, Clean Catch     Status: Abnormal   Collection Time: 05/13/21  4:54 PM  Result Value Ref Range   Color, Urine YELLOW YELLOW   APPearance HAZY (A) CLEAR   Specific Gravity, Urine 1.018 1.005 - 1.030   pH 6.0  5.0 - 8.0   Glucose, UA NEGATIVE NEGATIVE mg/dL   Hgb urine dipstick SMALL (A) NEGATIVE   Bilirubin Urine NEGATIVE NEGATIVE   Ketones, ur 20 (A) NEGATIVE mg/dL   Protein, ur 30 (A) NEGATIVE mg/dL   Nitrite NEGATIVE NEGATIVE   Leukocytes,Ua NEGATIVE NEGATIVE   RBC / HPF 0-5 0 - 5 RBC/hpf   WBC, UA 6-10 0 - 5 WBC/hpf   Bacteria, UA RARE (A) NONE SEEN   Squamous Epithelial / LPF 0-5 0 - 5   Mucus PRESENT    Hyaline Casts, UA PRESENT     Comment: Performed at [facility], [place] [address] [place], [place] [address]  Lactic acid, plasma     Status: Abnormal   Collection Time: 05/13/21  5:13 PM  Result Value Ref Range   Lactic Acid, Venous 2.0 (HH) 0.5 - 1.9 mmol/L    Comment: CRITICAL RESULT CALLED TO, READ BACK BY AND VERIFIED WITH: Shawn [address] 1924 05/13/2021 WBOND Performed at [facility], [place] [address], [place],  [address]   Blood Culture (routine x 2)     Status: None (Preliminary result)   Collection Time: 05/13/21  5:13 PM   Specimen: BLOOD  Result Value Ref Range   Specimen Description BLOOD LEFT ANTECUBITAL    Special Requests  BOTTLES DRAWN AEROBIC AND ANAEROBIC Blood Culture results may not be optimal due to an inadequate volume of blood received in culture bottles   Culture      NO GROWTH < 12 HOURS Performed at [facility] [address] [place], [place] [address]    Report Status PENDING   Resp Panel by RT-PCR (Flu A&B, Covid) Nasopharyngeal Swab     Status: None   Collection Time: 05/13/21  5:22 PM   Specimen: Nasopharyngeal Swab; Nasopharyngeal(NP) swabs in vial transport medium  Result Value Ref Range   SARS Coronavirus 2 by RT PCR NEGATIVE NEGATIVE    Comment: (NOTE) SARS-CoV-2 target nucleic acids are NOT DETECTED.  The SARS-CoV-2 RNA is generally detectable in upper respiratory specimens during the acute phase of infection. The lowest concentration of SARS-CoV-2 viral copies this assay can detect is 138 copies/mL. A  negative result does not preclude SARS-Cov-2 infection and should not be used as the sole basis for treatment or other patient management decisions. A negative result may occur with  improper specimen collection/handling, submission of specimen other than nasopharyngeal swab, presence of viral mutation(s) within the areas targeted by this assay, and inadequate number of viral copies(<138 copies/mL). A negative result must be combined with clinical observations, patient history, and epidemiological information. The expected result is Negative.  Fact Sheet for Patients:  EntrepreneurPulse.com.au  Fact Sheet for Healthcare Providers:  IncredibleEmployment.be  This test is no t yet approved or cleared by the [place] FDA and  has been authorized for detection and/or diagnosis of SARS-CoV-2 by FDA under an Emergency Use Authorization (EUA). This EUA will remain  in effect (meaning this test can be used) for the duration of the COVID-19 declaration under Section 564(b)(1) of the Act, 21 U.S.C.section 360bbb-3(b)(1), unless the authorization is terminated  or revoked sooner.       Influenza A by PCR NEGATIVE NEGATIVE   Influenza B by PCR NEGATIVE NEGATIVE    Comment: (NOTE) The Xpert Xpress SARS-CoV-2/FLU/RSV plus assay is intended as an aid in the diagnosis of influenza from Nasopharyngeal swab specimens and should not be used as a sole basis for treatment. Nasal washings and aspirates are unacceptable for Xpert Xpress SARS-CoV-2/FLU/RSV testing.  Fact Sheet for Patients: EntrepreneurPulse.com.au  Fact Sheet for Healthcare Providers: IncredibleEmployment.be  This test is not yet approved or cleared by the [place] FDA and has been authorized for detection and/or diagnosis of SARS-CoV-2 by FDA under an Emergency Use Authorization (EUA). This EUA will remain in effect (meaning this test can be used)  for the duration of the COVID-19 declaration under Section 564(b)(1) of the Act, 21 U.S.C. section 360bbb-3(b)(1), unless the authorization is terminated or revoked.  Performed at [facility], [place] [address], [place], [place] [address]   Blood Culture (routine x 2)     Status: None (Preliminary result)   Collection Time: 05/13/21  5:39 PM   Specimen: BLOOD  Result Value Ref Range   Specimen Description BLOOD RIGHT ANTECUBITAL    Special Requests      BOTTLES DRAWN AEROBIC AND ANAEROBIC Blood Culture results may not be optimal due to an inadequate volume of blood received in culture bottles   Culture      NO GROWTH < 12 HOURS Performed at [facility] [address], [place], [place] [address]    Report Status PENDING   Lactic acid, plasma     Status: None   Collection Time: 05/13/21  8:25  PM  Result Value Ref Range   Lactic Acid, Venous 1.4 0.5 - 1.9 mmol/L    Comment: Performed at [facility] [address], [place], [place] [address]  D-dimer, quantitative     Status: Abnormal   Collection Time: 05/13/21  8:25 PM  Result Value Ref Range   D-Dimer, Quant 1.20 (H) 0.00 - 0.50 ug/mL-FEU    Comment: (NOTE) At the manufacturer cut-off value of 0.5 g/mL FEU, this assay has a negative predictive value of 95-100%.This assay is intended for use in conjunction with a clinical pretest probability (PTP) assessment model to exclude pulmonary embolism (PE) and deep venous thrombosis (DVT) in outpatients suspected of PE or DVT. Results should be correlated with clinical presentation. Performed at [facility], [place] [address] [place], [place] [address]   CBG monitoring, ED     Status: Abnormal   Collection Time: 05/13/21 11:59 PM  Result Value Ref Range   Glucose-Capillary 61 (L) 70 - 99 mg/dL    Comment: Glucose reference range applies only to samples taken after fasting for at least 8 hours.  Procalcitonin - Baseline     Status: None   Collection Time:  05/14/21 12:00 AM  Result Value Ref Range   Procalcitonin 0.11 ng/mL    Comment:        Interpretation: PCT (Procalcitonin) <= 0.5 ng/mL: Systemic infection (sepsis) is not likely. Local bacterial infection is possible. (NOTE)       Sepsis PCT Algorithm           Lower Respiratory Tract                                      Infection PCT Algorithm    ----------------------------     ----------------------------         PCT < 0.25 ng/mL                PCT < 0.10 ng/mL          Strongly encourage             Strongly discourage   discontinuation of antibiotics    initiation of antibiotics    ----------------------------     -----------------------------       PCT 0.25 - 0.50 ng/mL            PCT 0.10 - 0.25 ng/mL               OR       >80% decrease in PCT            Discourage initiation of                                            antibiotics      Encourage discontinuation           of antibiotics    ----------------------------     -----------------------------         PCT >= 0.50 ng/mL              PCT 0.26 - 0.50 ng/mL               AND        <80% decrease in PCT  Encourage initiation of                                             antibiotics       Encourage continuation           of antibiotics    ----------------------------     -----------------------------        PCT >= 0.50 ng/mL                  PCT > 0.50 ng/mL               AND         increase in PCT                  Strongly encourage                                      initiation of antibiotics    Strongly encourage escalation           of antibiotics                                     -----------------------------                                           PCT <= 0.25 ng/mL                                                 OR                                        > 80% decrease in PCT                                      Discontinue / Do not initiate                                              antibiotics  Performed at [facility], [address] [place], [place] [address]   CBG monitoring, ED     Status: Abnormal   Collection Time: 05/14/21  3:36 AM  Result Value Ref Range   Glucose-Capillary 103 (H) 70 - 99 mg/dL    Comment: Glucose reference range applies only to samples taken after fasting for at least 8 hours.  Procalcitonin     Status: None   Collection Time: 05/14/21  6:30 AM  Result Value Ref Range   Procalcitonin 0.21 ng/mL    Comment:        Interpretation: PCT (Procalcitonin) <= 0.5 ng/mL: Systemic infection (sepsis) is not likely. Local bacterial infection is possible. (NOTE)  Sepsis PCT Algorithm           Lower Respiratory Tract                                      Infection PCT Algorithm    ----------------------------     ----------------------------         PCT < 0.25 ng/mL                PCT < 0.10 ng/mL          Strongly encourage             Strongly discourage   discontinuation of antibiotics    initiation of antibiotics    ----------------------------     -----------------------------       PCT 0.25 - 0.50 ng/mL            PCT 0.10 - 0.25 ng/mL               OR       >80% decrease in PCT            Discourage initiation of                                            antibiotics      Encourage discontinuation           of antibiotics    ----------------------------     -----------------------------         PCT >= 0.50 ng/mL              PCT 0.26 - 0.50 ng/mL               AND        <80% decrease in PCT             Encourage initiation of                                             antibiotics       Encourage continuation           of antibiotics    ----------------------------     -----------------------------        PCT >= 0.50 ng/mL                  PCT > 0.50 ng/mL               AND         increase in PCT                  Strongly encourage                                      initiation of antibiotics    Strongly  encourage escalation           of antibiotics                                     -----------------------------  PCT <= 0.25 ng/mL                                                 OR                                        > 80% decrease in PCT                                      Discontinue / Do not initiate                                             antibiotics  Performed at [facility], [place] [address], [place], [place] [address]   CBC with Differential/Platelet     Status: Abnormal   Collection Time: 05/14/21  6:30 AM  Result Value Ref Range   WBC 9.4 4.0 - 10.5 K/uL   RBC 4.49 4.22 - 5.81 MIL/uL   Hemoglobin 13.7 13.0 - 17.0 g/dL   HCT 42.2 39.0 - 52.0 %   MCV 94.0 80.0 - 100.0 fL   MCH 30.5 26.0 - 34.0 pg   MCHC 32.5 30.0 - 36.0 g/dL   RDW 14.9 11.5 - 15.5 %   Platelets 202 150 - 400 K/uL   nRBC 0.0 0.0 - 0.2 %   Neutrophils Relative % 69 %   Neutro Abs 6.4 1.7 - 7.7 K/uL   Lymphocytes Relative 12 %   Lymphs Abs 1.2 0.7 - 4.0 K/uL   Monocytes Relative 9 %   Monocytes Absolute 0.8 0.1 - 1.0 K/uL   Eosinophils Relative 5 %   Eosinophils Absolute 0.5 0.0 - 0.5 K/uL   Basophils Relative 1 %   Basophils Absolute 0.1 0.0 - 0.1 K/uL   Immature Granulocytes 4 %   Abs Immature Granulocytes 0.38 (H) 0.00 - 0.07 K/uL    Comment: Performed at [facility] [address], [place], [place] [address]  Comprehensive metabolic panel     Status: Abnormal   Collection Time: 05/14/21  6:30 AM  Result Value Ref Range   Sodium 137 135 - 145 mmol/L   Potassium 3.7 3.5 - 5.1 mmol/L   Chloride 104 98 - 111 mmol/L   CO2 21 (L) 22 - 32 mmol/L   Glucose, Bld 99 70 - 99 mg/dL    Comment: Glucose reference range applies only to samples taken after fasting for at least 8 hours.   BUN 8 8 - 23 mg/dL   Creatinine, Ser 1.13 0.61 - 1.24 mg/dL   Calcium 8.2 (L) 8.9 - 10.3 mg/dL   Total Protein 5.2 (L) 6.5 - 8.1 g/dL   Albumin 2.5 (L) 3.5 -  5.0 g/dL   AST 17 15 - 41 U/L   ALT 5 0 - 44 U/L   Alkaline Phosphatase 80 38 - 126 U/L   Total Bilirubin 1.1 0.3 - 1.2 mg/dL   GFR, Estimated >60 >60 mL/min    Comment: (NOTE) Calculated using the CKD-EPI Creatinine Equation (2021)    Anion gap  12 5 - 15    Comment: Performed at [facility], [place] [address], [place], [place] [address]  CBG monitoring, ED     Status: None   Collection Time: 05/14/21  7:05 AM  Result Value Ref Range   Glucose-Capillary 88 70 - 99 mg/dL    Comment: Glucose reference range applies only to samples taken after fasting for at least 8 hours.    MICRO:  IMAGING: DG Chest Port 1 View  Result Date: 05/13/2021 CLINICAL DATA:  Diaphoretic tachycardia EXAM: PORTABLE CHEST 1 VIEW COMPARISON:  04/12/2021 FINDINGS: Right upper extremity central venous catheter tip over the SVC. Post sternotomy changes. Cardiomegaly without focal airspace disease, effusion or pneumothorax. Small calcified lung nodules on right. Aortic atherosclerosis IMPRESSION: Cardiomegaly without focal airspace disease Electronically Signed   By: Donavan Foil M.D.   On: 05/13/2021 17:40    HISTORICAL MICRO/IMAGING  Assessment/Plan:    #Possible sepsis  #Deconditioning Patient admitted recently for MSSA septicemia and treated with 4 weeks of IV ancef. Upon arrival, the patient was initially hypotensive, hypothermic and started on code sepsis protocol. Received 30 cc/kg of IV fluids and started on IV vanc and cefepime. Hypotension resolved after IV fluids. Blood cultures initially drawn and are with no growth at <24 hrs. Patient had echo on 9/8 with no concerns for vegetations or endocarditis and also had a recent CT of his lumbar spine with no evidence of osteomyelitis. The patient has had poor po intake since his hospitalization and states that he has not worked with PT much at his SNF.  - Will continue vanc/cefepime while blood cultures continue to grow out - Recommend removal of RUE picc  line - Continue to follow blood cultures - Encourage adequate PO nutrition and hydration  - Encourage patient to work with PT/OT  #Fungal infection of penis Likely candidal infection on shaft of the patient's penis. The patient denies any pain, burning, or itching. Denies any urinary symptoms or any discharge. - Recommend topical antifungal therapy (clotrimazole vs nystatin)    Buddy Duty, DO PGY-1, Internal Medicine Resident

## 2021-05-14 NOTE — Progress Notes (Signed)
Pharmacy Antibiotic Note  Michael Hines is a 75 y.o. male admitted on 05/13/2021 with sepsis.  Pharmacy has been consulted for vancomycin and cefepime dosing.  WBC 11.3 > 9.3, afebrile, SCr improving 1.67 > 1.13 today (BL<1).   Plan: Vancomycin 750 mg IV q24h > changed to 1500mg  IV q24h (eAUC 463, Cr 1.13mg /dL) Cefepime 2 g IV q12h > adjusted to q8h for improving CrCl Follow labs, cultures, and clinical improvement as appropriate.  Order vanc levels as appropriate  Height: 5\' 7"  (170.2 cm) Weight: 90.7 kg (200 lb) IBW/kg (Calculated) : 66.1  Temp (24hrs), Avg:97.7 F (36.5 C), Min:95 F (35 C), Max:99.1 F (37.3 C)  Recent Labs  Lab 05/13/21 1654 05/13/21 1713 05/13/21 2025 05/14/21 0630  WBC 11.3*  --   --  9.4  CREATININE 1.67*  --   --  1.13  LATICACIDVEN  --  2.0* 1.4  --      Estimated Creatinine Clearance: 62.5 mL/min (by C-G formula based on SCr of 1.13 mg/dL).    No Known Allergies  Antimicrobials this admission: Vancomycin 10/3>> Cefepime 10/3>>(10/9)  Microbiology results: 10/3 BCx x2: ordered  Thank you for allowing pharmacy to be a part of this patient's care.  Joetta Manners, PharmD, [facility] Clinical Pharmacist ED RPh Phone: [place]: [phone]

## 2021-05-14 NOTE — ED Notes (Signed)
Pt oriented to place and person. Pt given coke for hypoglycemia, but started to throw up. Zofran to be given.

## 2021-05-14 NOTE — ED Notes (Signed)
Admitting doctor at bedside to re-evaluate pt for med-surge status.

## 2021-05-14 NOTE — Progress Notes (Signed)
Pharmacy Note  EDRN notified pharmacy that she was getting an alert that pt's daily cefepime dose was too high; reviewed chart and agree.  For current eCrCl 40 ml/min will change cefepime to 2g IV Q12H.  Wynona Neat, PharmD, BCPS  05/14/2021 2:05 AM

## 2021-05-15 ENCOUNTER — Inpatient Hospital Stay (HOSPITAL_COMMUNITY): Payer: Medicare Other

## 2021-05-15 DIAGNOSIS — R7989 Other specified abnormal findings of blood chemistry: Secondary | ICD-10-CM | POA: Diagnosis not present

## 2021-05-15 DIAGNOSIS — N179 Acute kidney failure, unspecified: Secondary | ICD-10-CM | POA: Diagnosis not present

## 2021-05-15 DIAGNOSIS — A419 Sepsis, unspecified organism: Secondary | ICD-10-CM | POA: Diagnosis not present

## 2021-05-15 DIAGNOSIS — T68XXXD Hypothermia, subsequent encounter: Secondary | ICD-10-CM

## 2021-05-15 LAB — CBC
HCT: 41.2 % (ref 39.0–52.0)
Hemoglobin: 13.6 g/dL (ref 13.0–17.0)
MCH: 30.8 pg (ref 26.0–34.0)
MCHC: 33 g/dL (ref 30.0–36.0)
MCV: 93.2 fL (ref 80.0–100.0)
Platelets: 196 10*3/uL (ref 150–400)
RBC: 4.42 MIL/uL (ref 4.22–5.81)
RDW: 14.4 % (ref 11.5–15.5)
WBC: 9.7 10*3/uL (ref 4.0–10.5)
nRBC: 0 % (ref 0.0–0.2)

## 2021-05-15 LAB — BASIC METABOLIC PANEL
Anion gap: 11 (ref 5–15)
BUN: <5 mg/dL — ABNORMAL LOW (ref 8–23)
CO2: 23 mmol/L (ref 22–32)
Calcium: 8.1 mg/dL — ABNORMAL LOW (ref 8.9–10.3)
Chloride: 99 mmol/L (ref 98–111)
Creatinine, Ser: 0.82 mg/dL (ref 0.61–1.24)
GFR, Estimated: >60 mL/min (ref >60)
Glucose, Bld: 95 mg/dL (ref 70–99)
Potassium: 3 mmol/L — ABNORMAL LOW (ref 3.5–5.1)
Sodium: 133 mmol/L — ABNORMAL LOW (ref 135–145)

## 2021-05-15 LAB — VITAMIN B12: Vitamin B-12: 984 pg/mL — ABNORMAL HIGH (ref 180–914)

## 2021-05-15 LAB — PROCALCITONIN: Procalcitonin: <0.1 ng/mL

## 2021-05-15 LAB — GLUCOSE, CAPILLARY
Glucose-Capillary: 125 mg/dL — ABNORMAL HIGH (ref 70–99)
Glucose-Capillary: 79 mg/dL (ref 70–99)

## 2021-05-15 LAB — MRSA NEXT GEN BY PCR, NASAL: MRSA by PCR Next Gen: NOT DETECTED

## 2021-05-15 LAB — TSH: TSH: 1.957 u[IU]/mL (ref 0.350–4.500)

## 2021-05-15 LAB — CBG MONITORING, ED
Glucose-Capillary: 97 mg/dL (ref 70–99)
Glucose-Capillary: 100 mg/dL — ABNORMAL HIGH (ref 70–99)

## 2021-05-15 LAB — AMMONIA: Ammonia: 21 umol/L (ref 9–35)

## 2021-05-15 MED ORDER — POTASSIUM CHLORIDE CRYS ER 20 MEQ PO TBCR: 40.0000 meq | EXTENDED_RELEASE_TABLET | Freq: Once | ORAL | Status: DC

## 2021-05-15 MED ORDER — POTASSIUM CHLORIDE 10 MEQ/100ML IV SOLN
10.0000 meq | INTRAVENOUS | Status: AC
  Administered 2021-05-15 (×5): 10 meq via INTRAVENOUS
  Filled 2021-05-15 (×5): qty 100

## 2021-05-15 MED ORDER — THIAMINE HCL 100 MG/ML IJ SOLN: 100.0000 mg | Freq: Every day | INTRAMUSCULAR | Status: DC

## 2021-05-15 MED ORDER — LORAZEPAM 1 MG PO TABS
0.5000 mg | ORAL_TABLET | Freq: Once | ORAL | Status: AC
  Administered 2021-05-15: 0.5 mg via ORAL
  Filled 2021-05-15: qty 1

## 2021-05-15 MED ORDER — POTASSIUM CHLORIDE CRYS ER 20 MEQ PO TBCR: 40.0000 meq | EXTENDED_RELEASE_TABLET | Freq: Two times a day (BID) | ORAL | Status: DC

## 2021-05-15 MED ORDER — SODIUM CHLORIDE 0.9 % IV SOLN: INTRAVENOUS | Status: DC

## 2021-05-15 MED ORDER — THIAMINE HCL 100 MG PO TABS
100.0000 mg | ORAL_TABLET | Freq: Every day | ORAL | Status: DC
  Filled 2021-05-15: qty 1

## 2021-05-15 NOTE — ED Notes (Signed)
Lunch ordered 

## 2021-05-15 NOTE — Evaluation (Signed)
Occupational Therapy Evaluation Patient Details Name: Michael Hines MRN: [id number] DOB: 11-21-1945 Today's Date: 05/15/2021   History of Present Illness 75 y.o. male presents to [facility] hospital on 05/13/2021 from ID clinic due to hypothermia and hypotension. Pt with recent hospitalization in September for MSSA septicemia, was discharged to SNF. Blood cultures negative this admission. PMH includes COPD, type 2 diabetes, hyperlipidemia, bioprosthetic aortic valve replacement.   Clinical Impression   Pt admitted with above. He demonstrates the below listed deficits and will benefit from continued OT to maximize safety and independence with BADLs.  Pt presents to OT with generalized weakness, decreased activity tolerance, impaired cognition.  Pt with very limited participation this date and requires total A for all aspects of ADLs.  Per pt's wife, he has had a significant decline in function over the past month with a significant decline in cognition.  He was living at home prior to last admission and was mod I, but since last admission (early sept 2022), he has required max - total A for ADLs and has not ambulated.  Will follow acutely.  Anticipate he will require SNF level rehab.        Recommendations for follow up therapy are one component of a multi-disciplinary discharge planning process, led by the attending physician.  Recommendations may be updated based on patient status, additional functional criteria and insurance authorization.   Follow Up Recommendations  SNF    Equipment Recommendations  None recommended by OT    Recommendations for Other Services       Precautions / Restrictions Precautions Precautions: Fall Precaution Comments: Pt confused and agitated      Mobility Bed Mobility               General bed mobility comments: pt would not attempt    Transfers                 General transfer comment: unable to attempt    Balance                                            ADL either performed or assessed with clinical judgement   ADL Overall ADL's : Needs assistance/impaired Eating/Feeding: Total assistance;Bed level Eating/Feeding Details (indicate cue type and reason): wife reports his intake has been poor and that he has only been eating 5-6 bites per meal Grooming: Wash/dry hands;Wash/dry face;Oral care;Brushing hair;Total assistance;Bed level   Upper Body Bathing: Total assistance;Bed level   Lower Body Bathing: Total assistance;Bed level   Upper Body Dressing : Total assistance;Bed level   Lower Body Dressing: Total assistance;Bed level   Toilet Transfer: Total assistance Toilet Transfer Details (indicate cue type and reason): unable Toileting- Clothing Manipulation and Hygiene: Total assistance       Functional mobility during ADLs: Total assistance General ADL Comments: pt resistive to activity     Vision   Additional Comments: pt unable to participate     Perception     Praxis      Pertinent Vitals/Pain Pain Assessment: Faces Faces Pain Scale: Hurts a little bit Pain Location: generalized     Hand Dominance Right   Extremity/Trunk Assessment Upper Extremity Assessment Upper Extremity Assessment: Generalized weakness (grossly assessed as pt would not follow commands)   Lower Extremity Assessment Lower Extremity Assessment: Defer to PT evaluation       Communication Communication Communication: No  difficulties   Cognition Arousal/Alertness: Lethargic Behavior During Therapy: Agitated Overall Cognitive Status: Impaired/Different from baseline Area of Impairment: Orientation;Attention;Following commands                 Orientation Level: Disoriented to;Place;Time;Situation Current Attention Level: Focused   Following Commands: Follows one step commands inconsistently       General Comments: Pt irritable, and becomse mildly agitated when attempting to engage him.   confusion noted   General Comments       Exercises     Shoulder Instructions      Home Living Family/patient expects to be discharged to:: Skilled nursing facility                                 Additional Comments: Pt has been at SNF since last admission in early sept.  Prior to that, he lived with his wife and was mod I wtih ADLs and functional mobility      Prior Functioning/Environment Level of Independence: Needs assistance  Gait / Transfers Assistance Needed: wife report that pt has not ambulated since early sept. 2022.  She reports he has made limited progress with rehab ADL's / Homemaking Assistance Needed: Pt has required max - total A for self care at SNF, per wife            OT Problem List: Decreased strength;Decreased activity tolerance;Impaired balance (sitting and/or standing);Decreased coordination;Decreased cognition;Decreased safety awareness;Decreased knowledge of use of DME or AE      OT Treatment/Interventions: Self-care/ADL training;Therapeutic exercise;DME and/or AE instruction;Therapeutic activities;Cognitive remediation/compensation;Patient/family education;Balance training    OT Goals(Current goals can be found in the care plan section) Acute Rehab OT Goals Patient Stated Goal: wife OT Goal Formulation: With family Time For Goal Achievement: 05/29/21 Potential to Achieve Goals: Fair ADL Goals Pt Will Perform Eating: with min assist;with adaptive utensils;sitting Pt Will Perform Grooming: with mod assist;sitting Pt Will Perform Upper Body Bathing: with mod assist;sitting Additional ADL Goal #1: Pt will actively participate in 10 mins therapeutic activity with min cues  OT Frequency: Min 2X/week   Barriers to D/C:            Co-evaluation              AM-PAC OT "6 Clicks" Daily Activity     Outcome Measure Help from another person eating meals?: Total Help from another person taking care of personal grooming?:  Total Help from another person toileting, which includes using toliet, bedpan, or urinal?: Total Help from another person bathing (including washing, rinsing, drying)?: Total Help from another person to put on and taking off regular upper body clothing?: Total Help from another person to put on and taking off regular lower body clothing?: Total 6 Click Score: 6   End of Session Nurse Communication: Mobility status  Activity Tolerance: Patient limited by fatigue;Patient limited by lethargy;Other (comment) (cognition) Patient left: in bed;with family/visitor present  OT Visit Diagnosis: Cognitive communication deficit (U98.119)                Time: 1478-2956 OT Time Calculation (min): 13 min Charges:  OT General Charges $OT Visit: 1 Visit OT Evaluation $OT Eval Moderate Complexity: 1 Mod  Nilsa Nutting., OTR/L Acute Rehabilitation Services Pager [phone] Office [phone]   Lucille Passy M 05/15/2021, 5:46 PM

## 2021-05-15 NOTE — Progress Notes (Signed)
Bilateral lower extremity venous duplex has been completed. Preliminary results can be found in CV Proc through chart review.   05/15/21 12:50 PM Michael Hines RVT

## 2021-05-15 NOTE — Progress Notes (Signed)
PT Cancellation Note  Patient Details Name: Michael Hines MRN: [id number] DOB: 1946/04/02   Cancelled Treatment:    Reason Eval/Treat Not Completed: Patient declined, no reason specified;Medical issues which prohibited therapy. Pt asleep upon PT arrival, confirms that pt has been agitated with attempts at arousal today. Pt's spouse requests PT hold at this time and allow the pt to rest, requests PT return tomorrow.    Zenaida Niece 05/15/2021, 4:16 PM

## 2021-05-15 NOTE — Progress Notes (Signed)
[facility]   Reason for visit: Follow up on hypothermia  Interval History: no further hypothermia, no fever. No changes in his clinical course.    Day 3 cefepime  Physical Exam: Constitutional:  Vitals:   05/15/21 0747 05/15/21 1017  BP:  (!) 163/78  Pulse:  (!) 57  Resp:  18  Temp: 98.1 F (36.7 C)   SpO2:  93%   patient appears in NAD Respiratory: Normal respiratory effort; CTA B Cardiovascular: RRR GI: soft, nt, nd  Review of Systems: Constitutional: negative for fevers and chills Gastrointestinal: negative for diarrhea  Lab Results  Component Value Date   WBC 9.4 05/14/2021   HGB 13.7 05/14/2021   HCT 42.2 05/14/2021   MCV 94.0 05/14/2021   PLT 202 05/14/2021    Lab Results  Component Value Date   CREATININE 1.13 05/14/2021   BUN 8 05/14/2021   NA 137 05/14/2021   K 3.7 05/14/2021   CL 104 05/14/2021   CO2 21 (L) 05/14/2021    Lab Results  Component Value Date   ALT 5 05/14/2021   AST 17 05/14/2021   ALKPHOS 80 05/14/2021     Microbiology: Recent Results (from the past 240 hour(s))  Blood Culture (routine x 2)     Status: None (Preliminary result)   Collection Time: 05/13/21  5:13 PM   Specimen: BLOOD  Result Value Ref Range Status   Specimen Description BLOOD LEFT ANTECUBITAL  Final   Special Requests   Final    BOTTLES DRAWN AEROBIC AND ANAEROBIC Blood Culture results may not be optimal due to an inadequate volume of blood received in culture bottles   Culture   Final    NO GROWTH 2 DAYS Performed at [facility], [place] [address], [place], [place] [address]    Report Status PENDING  Incomplete  Resp Panel by RT-PCR (Flu A&B, Covid) Nasopharyngeal Swab     Status: None   Collection Time: 05/13/21  5:22 PM   Specimen: Nasopharyngeal Swab; Nasopharyngeal(NP) swabs in vial transport medium  Result Value Ref Range Status   SARS Coronavirus 2 by RT PCR NEGATIVE NEGATIVE Final    Comment: (NOTE) SARS-CoV-2  target nucleic acids are NOT DETECTED.  The SARS-CoV-2 RNA is generally detectable in upper respiratory specimens during the acute phase of infection. The lowest concentration of SARS-CoV-2 viral copies this assay can detect is 138 copies/mL. A negative result does not preclude SARS-Cov-2 infection and should not be used as the sole basis for treatment or other patient management decisions. A negative result may occur with  improper specimen collection/handling, submission of specimen other than nasopharyngeal swab, presence of viral mutation(s) within the areas targeted by this assay, and inadequate number of viral copies(<138 copies/mL). A negative result must be combined with clinical observations, patient history, and epidemiological information. The expected result is Negative.  Fact Sheet for Patients:  EntrepreneurPulse.com.au  Fact Sheet for Healthcare Providers:  IncredibleEmployment.be  This test is no t yet approved or cleared by the [place] FDA and  has been authorized for detection and/or diagnosis of SARS-CoV-2 by FDA under an Emergency Use Authorization (EUA). This EUA will remain  in effect (meaning this test can be used) for the duration of the COVID-19 declaration under Section 564(b)(1) of the Act, 21 U.S.C.section 360bbb-3(b)(1), unless the authorization is terminated  or revoked sooner.       Influenza A by PCR NEGATIVE NEGATIVE Final   Influenza B by  PCR NEGATIVE NEGATIVE Final    Comment: (NOTE) The Xpert Xpress SARS-CoV-2/FLU/RSV plus assay is intended as an aid in the diagnosis of influenza from Nasopharyngeal swab specimens and should not be used as a sole basis for treatment. Nasal washings and aspirates are unacceptable for Xpert Xpress SARS-CoV-2/FLU/RSV testing.  Fact Sheet for Patients: EntrepreneurPulse.com.au  Fact Sheet for Healthcare  Providers: IncredibleEmployment.be  This test is not yet approved or cleared by the [place] FDA and has been authorized for detection and/or diagnosis of SARS-CoV-2 by FDA under an Emergency Use Authorization (EUA). This EUA will remain in effect (meaning this test can be used) for the duration of the COVID-19 declaration under Section 564(b)(1) of the Act, 21 U.S.C. section 360bbb-3(b)(1), unless the authorization is terminated or revoked.  Performed at [facility], [place] [address], [place], [place] [address]   Blood Culture (routine x 2)     Status: None (Preliminary result)   Collection Time: 05/13/21  5:39 PM   Specimen: BLOOD  Result Value Ref Range Status   Specimen Description BLOOD RIGHT ANTECUBITAL  Final   Special Requests   Final    BOTTLES DRAWN AEROBIC AND ANAEROBIC Blood Culture results may not be optimal due to an inadequate volume of blood received in culture bottles   Culture   Final    NO GROWTH 2 DAYS Performed at [facility], [place] [address] [place], [place] [address]    Report Status PENDING  Incomplete    Impression/Plan:  1. Hypothermia - no signs of infection with negative blood cultures to date and no fever.  Picc line now removed.  Will stop cefepime, continue with supportive care.  2.  Weakness - he has had ongoing decline since his initial infection which has been exacerbated by poor po intake, difficulty participating in rehab. He has completed his treatment course for Staph aureus.   Certainly antibiotics may have contributed to his decline and now he is off of antibiotics. I am also concerned with the carbidopa/levodopa side effects on his CNS system. 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  He may benefit from stopping this medication but I will defer to the primary team.    3.  Access - picc line now removed and will not need  replacement of the picc line from an ID standpoint.   I will sign off, call with questions

## 2021-05-15 NOTE — Progress Notes (Signed)
Occupational Therapy Evaluation partially completed.   Pt with increased agitation with attempts to mobilize.  Wife present.  Will reattempt.  Nilsa Nutting., OTR/L Acute Rehabilitation Services Pager [phone] Office [phone]

## 2021-05-15 NOTE — ED Notes (Signed)
Placed Breakfast orders 

## 2021-05-15 NOTE — Progress Notes (Signed)
PROGRESS NOTE    Michael Hines  ION:[id number] DOB: 1945-09-28 DOA: 05/13/2021 PCP: Finis Bud, MD   Brief Narrative: 75 year old with past medical history significant for COPD, type 2 diabetes, hyperlipidemia, bioprosthetic aortic valve replacement and recent MSSA septicemia status post 4 weeks of IV Ancef which he will have finished 10/3 presented to the ID clinic for follow-up and found to be more confused than normal.  He is was diaphoretic and heart rate was in the 130s, temperature was 92.  They were not able to check his blood pressure.  He was transferred to the ED for further evaluation.  Patient was at a skilled nursing facility finishing up his IV antibiotic therapy.    Assessment & Plan:   Principal Problem:   Sepsis with acute organ dysfunction (HCC) Active Problems:   Diabetes mellitus without complication ([place])   S/P AVR   Diabetic polyneuropathy associated with type 2 diabetes mellitus (HCC)   Chronic bilateral low back pain without sciatica   Hypotension   Hypothermia   AKI (acute kidney injury) ([place])   Sepsis with acute organ dysfunction without septic shock ([place])  SIRS;  Patient presents with confusion, hypothermia, hypotension. Clear source of infection. .  Blood cultures no growth today.  Chest x-ray no evidence for infection. UA mild white blood cell.  Urine culture will be sent. ID was consulted due to recent MSSA bacteremia.  Patient was a started on cefepime and vancomycin.  Subsequently vancomycin was discontinued.  Cefepime was discontinued 10/5  Type 2 diabetes: Continue with a sliding scale insulin  Parkinsonism: Continue with levodopa carbidopa and gabapentin  Acute metabolic encephalopathy: Delirium, in the setting of recent infection, dehydration. Check B12, thiamine level, start IV thiamine. Ammonia level 21 normal, B12 984 normal.  TSH 1.9 normal Hyponatremia , hypokalemia: Continue with IV fluids replete potassium  Elevated  D-dimer: Doppler lower extremity negative for DVT.    Estimated body mass index is 29.83 kg/m as calculated from the following:   Height as of this encounter: 5\' 7"  (1.702 m).   Weight as of this encounter: 86.4 kg.   DVT prophylaxis: Lovenox Code Status: Full code Family Communication: Wife who was at bedside Disposition Plan:  Status is: Inpatient  Remains inpatient appropriate because:IV treatments appropriate due to intensity of illness or inability to take PO  Dispo: The patient is from: SNF              Anticipated d/c is to: SNF              Patient currently is not medically stable to d/c.   Difficult to place patient No        Consultants:  ID  Procedures:    Antimicrobials:    Subjective: He was agitated last night in the ED. He has been more confuse and cognitive declining since he had the infection. He has not been eating well.    Objective: Vitals:   05/15/21 1017 05/15/21 1317 05/15/21 1318 05/15/21 1638  BP: (!) 163/78  135/81 (!) 153/84  Pulse: (!) 57  (!) 47 (!) 56  Resp: 18  20 18   Temp:   98.9 F (37.2 C) 99 F (37.2 C)  TempSrc:   Oral Oral  SpO2: 93%  92% 92%  Weight:  86.4 kg    Height:  5\' 7"  (1.702 m)      Intake/Output Summary (Last 24 hours) at 05/15/2021 1800 Last data filed at 05/15/2021 1558 Gross per 24 hour  Intake 2746.05 ml  Output --  Net 2746.05 ml   Filed Weights   05/13/21 1648 05/15/21 1317  Weight: 90.7 kg 86.4 kg    Examination:  General exam: Appears calm and comfortable  Respiratory system: Clear to auscultation. Respiratory effort normal. Cardiovascular system: S1 & S2 heard, RRR. No JVD, murmurs, rubs, gallops or clicks. No pedal edema. Gastrointestinal system: Abdomen is nondistended, soft and nontender. No organomegaly or masses felt. Normal bowel sounds heard. Central nervous system: eyes close, answer few questions, confuse, follows some command Extremities: Symmetric 5 x 5 power.   Data  Reviewed: I have personally reviewed following labs and imaging studies  CBC: Recent Labs  Lab 05/13/21 1654 05/14/21 0630 05/15/21 1225  WBC 11.3* 9.4 9.7  NEUTROABS 9.4* 6.4  --   HGB 16.3 13.7 13.6  HCT 51.0 42.2 41.2  MCV 95.9 94.0 93.2  PLT 244 202 160   Basic Metabolic Panel: Recent Labs  Lab 05/13/21 1654 05/14/21 0630 05/15/21 1225  NA 135 137 133*  K 3.2* 3.7 3.0*  CL 101 104 99  CO2 18* 21* 23  GLUCOSE 132* 99 95  BUN 8 8 <5*  CREATININE 1.67* 1.13 0.82  CALCIUM 8.8* 8.2* 8.1*   GFR: Estimated Creatinine Clearance: 84.2 mL/min (by C-G formula based on SCr of 0.82 mg/dL). Liver Function Tests: Recent Labs  Lab 05/13/21 1654 05/14/21 0630  AST 21 17  ALT <5 5  ALKPHOS 118 80  BILITOT 1.3* 1.1  PROT 6.3* 5.2*  ALBUMIN 2.9* 2.5*   No results for input(s): LIPASE, AMYLASE in the last 168 hours. Recent Labs  Lab 05/15/21 1315  AMMONIA 21   Coagulation Profile: No results for input(s): INR, PROTIME in the last 168 hours. Cardiac Enzymes: No results for input(s): CKTOTAL, CKMB, CKMBINDEX, TROPONINI in the last 168 hours. BNP (last 3 results) No results for input(s): PROBNP in the last 8760 hours. HbA1C: No results for input(s): HGBA1C in the last 72 hours. CBG: Recent Labs  Lab 05/14/21 1702 05/14/21 2241 05/15/21 0816 05/15/21 1158 05/15/21 1641  GLUCAP 103* 110* 97 100* 125*   Lipid Profile: No results for input(s): CHOL, HDL, LDLCALC, TRIG, CHOLHDL, LDLDIRECT in the last 72 hours. Thyroid Function Tests: Recent Labs    05/15/21 1227  TSH 1.957   Anemia Panel: Recent Labs    05/15/21 1225  VITAMINB12 984*   Sepsis Labs: Recent Labs  Lab 05/13/21 1713 05/13/21 2025 05/14/21 0000 05/14/21 0630 05/15/21 1228  PROCALCITON  --   --  0.11 0.21 <0.10  LATICACIDVEN 2.0* 1.4  --   --   --     Recent Results (from the past 240 hour(s))  Blood Culture (routine x 2)     Status: None (Preliminary result)   Collection Time:  05/13/21  5:13 PM   Specimen: BLOOD  Result Value Ref Range Status   Specimen Description BLOOD LEFT ANTECUBITAL  Final   Special Requests   Final    BOTTLES DRAWN AEROBIC AND ANAEROBIC Blood Culture results may not be optimal due to an inadequate volume of blood received in culture bottles   Culture   Final    NO GROWTH 2 DAYS Performed at [facility], [place] [address] [place], [place] [address]    Report Status PENDING  Incomplete  Resp Panel by RT-PCR (Flu A&B, Covid) Nasopharyngeal Swab     Status: None   Collection Time: 05/13/21  5:22 PM   Specimen: Nasopharyngeal Swab; Nasopharyngeal(NP) swabs  in vial transport medium  Result Value Ref Range Status   SARS Coronavirus 2 by RT PCR NEGATIVE NEGATIVE Final    Comment: (NOTE) SARS-CoV-2 target nucleic acids are NOT DETECTED.  The SARS-CoV-2 RNA is generally detectable in upper respiratory specimens during the acute phase of infection. The lowest concentration of SARS-CoV-2 viral copies this assay can detect is 138 copies/mL. A negative result does not preclude SARS-Cov-2 infection and should not be used as the sole basis for treatment or other patient management decisions. A negative result may occur with  improper specimen collection/handling, submission of specimen other than nasopharyngeal swab, presence of viral mutation(s) within the areas targeted by this assay, and inadequate number of viral copies(<138 copies/mL). A negative result must be combined with clinical observations, patient history, and epidemiological information. The expected result is Negative.  Fact Sheet for Patients:  EntrepreneurPulse.com.au  Fact Sheet for Healthcare Providers:  IncredibleEmployment.be  This test is no t yet approved or cleared by the [place] FDA and  has been authorized for detection and/or diagnosis of SARS-CoV-2 by FDA under an Emergency Use Authorization (EUA). This EUA will remain   in effect (meaning this test can be used) for the duration of the COVID-19 declaration under Section 564(b)(1) of the Act, 21 U.S.C.section 360bbb-3(b)(1), unless the authorization is terminated  or revoked sooner.       Influenza A by PCR NEGATIVE NEGATIVE Final   Influenza B by PCR NEGATIVE NEGATIVE Final    Comment: (NOTE) The Xpert Xpress SARS-CoV-2/FLU/RSV plus assay is intended as an aid in the diagnosis of influenza from Nasopharyngeal swab specimens and should not be used as a sole basis for treatment. Nasal washings and aspirates are unacceptable for Xpert Xpress SARS-CoV-2/FLU/RSV testing.  Fact Sheet for Patients: EntrepreneurPulse.com.au  Fact Sheet for Healthcare Providers: IncredibleEmployment.be  This test is not yet approved or cleared by the [place] FDA and has been authorized for detection and/or diagnosis of SARS-CoV-2 by FDA under an Emergency Use Authorization (EUA). This EUA will remain in effect (meaning this test can be used) for the duration of the COVID-19 declaration under Section 564(b)(1) of the Act, 21 U.S.C. section 360bbb-3(b)(1), unless the authorization is terminated or revoked.  Performed at [facility], [place] [address] [place], [place] [address]   Blood Culture (routine x 2)     Status: None (Preliminary result)   Collection Time: 05/13/21  5:39 PM   Specimen: BLOOD  Result Value Ref Range Status   Specimen Description BLOOD RIGHT ANTECUBITAL  Final   Special Requests   Final    BOTTLES DRAWN AEROBIC AND ANAEROBIC Blood Culture results may not be optimal due to an inadequate volume of blood received in culture bottles   Culture   Final    NO GROWTH 2 DAYS Performed at [facility], [place] [address], [place], [place] [address]    Report Status PENDING  Incomplete         Radiology Studies: VAS [place] LOWER EXTREMITY VENOUS (DVT)  Result Date: 05/15/2021  Lower Venous DVT Study  Patient Name:  JOSIA CUEVA  Date of Exam:   05/15/2021 Medical Rec #: [id number]          Accession #:    [id number] Date of Birth: February 25, 1946          Patient Gender: M Patient Age:   81 years Exam Location:  [facility] Procedure:      VAS [place] LOWER EXTREMITY VENOUS (  DVT) Referring Phys: Jerald Kief Dola Lunsford --------------------------------------------------------------------------------  Indications: Elevated Ddimer.  Risk Factors: None identified. Limitations: Poor ultrasound/tissue interface and patient positioning, patient movement, poor patient cooperation. Comparison Study: No prior studies. Performing Technologist: Oliver Hum RVT  Examination Guidelines: A complete evaluation includes B-mode imaging, spectral Doppler, color Doppler, and power Doppler as needed of all accessible portions of each vessel. Bilateral testing is considered an integral part of a complete examination. Limited examinations for reoccurring indications may be performed as noted. The reflux portion of the exam is performed with the patient in reverse Trendelenburg.  +---------+---------------+---------+-----------+----------+--------------+ RIGHT    CompressibilityPhasicitySpontaneityPropertiesThrombus Aging +---------+---------------+---------+-----------+----------+--------------+ CFV      Full           Yes      Yes                                 +---------+---------------+---------+-----------+----------+--------------+ SFJ      Full                                                        +---------+---------------+---------+-----------+----------+--------------+ FV Prox  Full                                                        +---------+---------------+---------+-----------+----------+--------------+ FV Mid   Full                                                        +---------+---------------+---------+-----------+----------+--------------+ FV DistalFull                                                         +---------+---------------+---------+-----------+----------+--------------+ PFV      Full                                                        +---------+---------------+---------+-----------+----------+--------------+ POP      Full           Yes      Yes                                 +---------+---------------+---------+-----------+----------+--------------+ PTV      Full                                                        +---------+---------------+---------+-----------+----------+--------------+ PERO     Full                                                        +---------+---------------+---------+-----------+----------+--------------+   +---------+---------------+---------+-----------+----------+-------------------+  LEFT     CompressibilityPhasicitySpontaneityPropertiesThrombus Aging      +---------+---------------+---------+-----------+----------+-------------------+ CFV      Full           Yes      Yes                                      +---------+---------------+---------+-----------+----------+-------------------+ SFJ      Full                                                             +---------+---------------+---------+-----------+----------+-------------------+ FV Prox  Full                                                             +---------+---------------+---------+-----------+----------+-------------------+ FV Mid   Full                                                             +---------+---------------+---------+-----------+----------+-------------------+ FV DistalFull                                                             +---------+---------------+---------+-----------+----------+-------------------+ PFV      Full                                                             +---------+---------------+---------+-----------+----------+-------------------+ POP       Full           Yes      Yes                                      +---------+---------------+---------+-----------+----------+-------------------+ PTV      Full                                                             +---------+---------------+---------+-----------+----------+-------------------+ PERO                                                  Not well visualized +---------+---------------+---------+-----------+----------+-------------------+     Summary: RIGHT: - There is no  evidence of deep vein thrombosis in the lower extremity. However, portions of this examination were limited- see technologist comments above.  - No cystic structure found in the popliteal fossa.  LEFT: - There is no evidence of deep vein thrombosis in the lower extremity. However, portions of this examination were limited- see technologist comments above.  - No cystic structure found in the popliteal fossa.  *See table(s) above for measurements and observations. Electronically signed by Harold Barban MD on 05/15/2021 at 4:52:01 PM.    Final         Scheduled Meds:  buPROPion  300 mg Oral Daily   carbidopa-levodopa  1 tablet Oral BID   clotrimazole   Topical BID   enoxaparin (LOVENOX) injection  40 mg Subcutaneous Q24H   fluticasone furoate-vilanterol  1 puff Inhalation Daily   gabapentin  300 mg Oral q AM   gabapentin  600 mg Oral QPM   insulin aspart  0-15 Units Subcutaneous TID WC   insulin aspart  0-5 Units Subcutaneous QHS   thiamine  100 mg Oral Daily   Continuous Infusions:  dextrose 5 % and 0.9% NaCl 75 mL/hr at 05/15/21 1309     LOS: 2 days    Time spent: 35 minutes    Zamir Staples A Janavia Rottman, MD [facility]   If 7PM-7AM, please contact night-coverage [email or web address]  05/15/2021, 6:00 PM

## 2021-05-16 ENCOUNTER — Inpatient Hospital Stay (HOSPITAL_COMMUNITY): Payer: Medicare Other

## 2021-05-16 DIAGNOSIS — Z952 Presence of prosthetic heart valve: Secondary | ICD-10-CM | POA: Diagnosis not present

## 2021-05-16 DIAGNOSIS — A419 Sepsis, unspecified organism: Secondary | ICD-10-CM | POA: Diagnosis not present

## 2021-05-16 DIAGNOSIS — I471 Supraventricular tachycardia: Secondary | ICD-10-CM | POA: Diagnosis not present

## 2021-05-16 DIAGNOSIS — N179 Acute kidney failure, unspecified: Secondary | ICD-10-CM | POA: Diagnosis not present

## 2021-05-16 LAB — BASIC METABOLIC PANEL
Anion gap: 14 (ref 5–15)
Anion gap: 11 (ref 5–15)
BUN: <5 mg/dL — ABNORMAL LOW (ref 8–23)
BUN: <5 mg/dL — ABNORMAL LOW (ref 8–23)
CO2: 21 mmol/L — ABNORMAL LOW (ref 22–32)
CO2: 26 mmol/L (ref 22–32)
Calcium: 8.2 mg/dL — ABNORMAL LOW (ref 8.9–10.3)
Calcium: 8.4 mg/dL — ABNORMAL LOW (ref 8.9–10.3)
Chloride: 98 mmol/L (ref 98–111)
Chloride: 95 mmol/L — ABNORMAL LOW (ref 98–111)
Creatinine, Ser: 0.74 mg/dL (ref 0.61–1.24)
Creatinine, Ser: 0.91 mg/dL (ref 0.61–1.24)
GFR, Estimated: >60 mL/min (ref >60)
GFR, Estimated: >60 mL/min (ref >60)
Glucose, Bld: 68 mg/dL — ABNORMAL LOW (ref 70–99)
Glucose, Bld: 199 mg/dL — ABNORMAL HIGH (ref 70–99)
Potassium: 3.3 mmol/L — ABNORMAL LOW (ref 3.5–5.1)
Potassium: 3.6 mmol/L (ref 3.5–5.1)
Sodium: 133 mmol/L — ABNORMAL LOW (ref 135–145)
Sodium: 132 mmol/L — ABNORMAL LOW (ref 135–145)

## 2021-05-16 LAB — GLUCOSE, CAPILLARY
Glucose-Capillary: 216 mg/dL — ABNORMAL HIGH (ref 70–99)
Glucose-Capillary: 84 mg/dL (ref 70–99)
Glucose-Capillary: 86 mg/dL (ref 70–99)
Glucose-Capillary: 166 mg/dL — ABNORMAL HIGH (ref 70–99)
Glucose-Capillary: 227 mg/dL — ABNORMAL HIGH (ref 70–99)
Glucose-Capillary: 82 mg/dL (ref 70–99)

## 2021-05-16 LAB — TROPONIN I (HIGH SENSITIVITY)
Troponin I (High Sensitivity): 24 ng/L — ABNORMAL HIGH (ref <18)
Troponin I (High Sensitivity): 21 ng/L — ABNORMAL HIGH (ref <18)

## 2021-05-16 LAB — URINE CULTURE: Culture: NO GROWTH

## 2021-05-16 LAB — MAGNESIUM
Magnesium: 1.1 mg/dL — ABNORMAL LOW (ref 1.7–2.4)
Magnesium: 1.9 mg/dL (ref 1.7–2.4)

## 2021-05-16 IMAGING — MR MR HEAD W/O CM
6 series · 48 of 48 positions shown · non-contrast
Comparison: From prior CT from [DATE].

CLINICAL DATA: Initial evaluation for acute delirium.

EXAM:
MRI HEAD WITHOUT CONTRAST
TECHNIQUE: Multiplanar, multiecho pulse sequences of the brain and surrounding
structures were obtained without intravenous contrast.

[Series 5: DWI · axial · 3.0mm · 0.96mm/px · z∈[-54,+102]mm · 15 of 114 slices shown (1 of 4)]
[im 1/114]
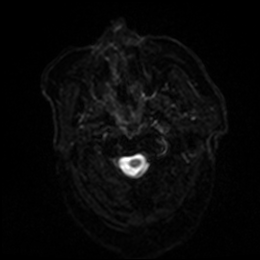
[im 9/114]
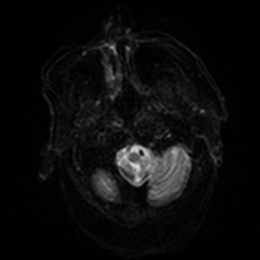
[im 17/114]
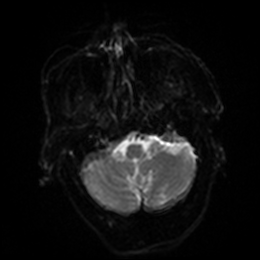
[im 25/114]
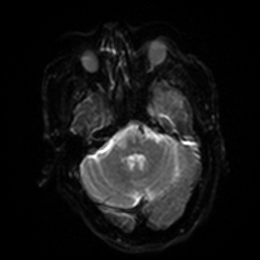
[im 33/114]
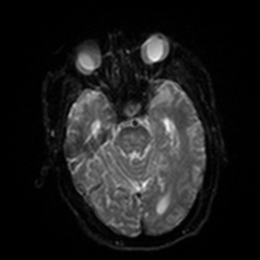
[im 41/114]
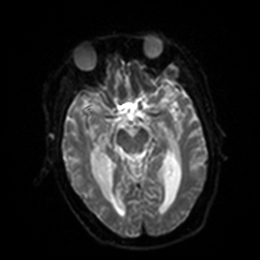
[im 49/114]
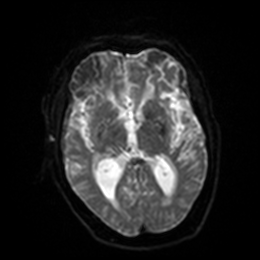
[im 57/114]
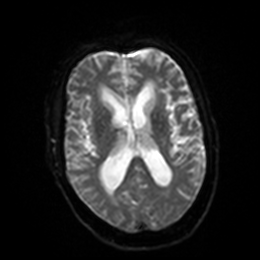
[im 65/114]
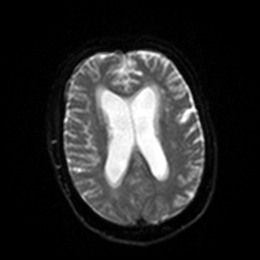
[im 73/114]
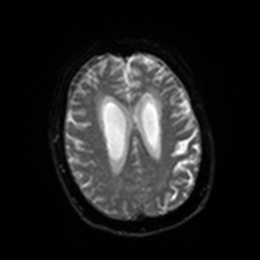
[im 81/114]
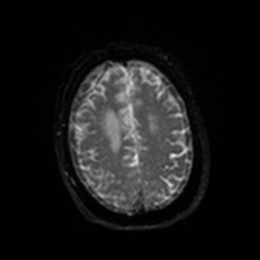
[im 89/114]
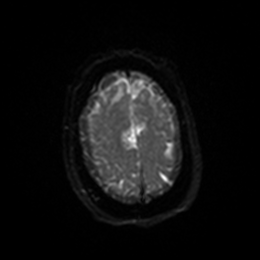
[im 97/114]
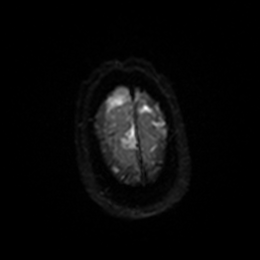
[im 105/114]
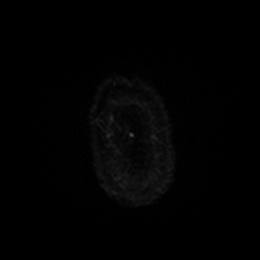
[im 114/114]
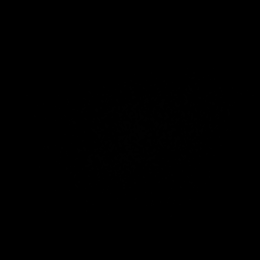

[Series 6: DWI · axial · 3.0mm · 0.96mm/px · z∈[-54,+99]mm · 7 of 56 slices shown (2 of 4)]
[im 1/56]
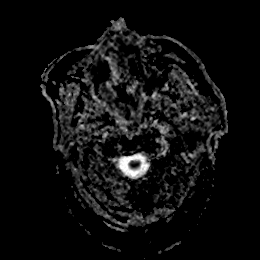
[im 10/56]
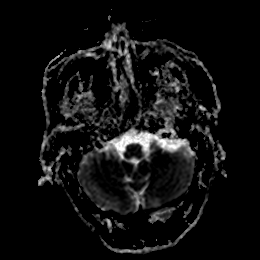
[im 19/56]
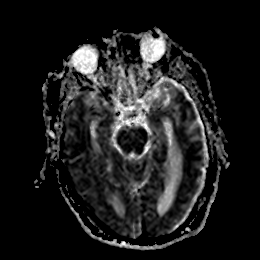
[im 28/56]
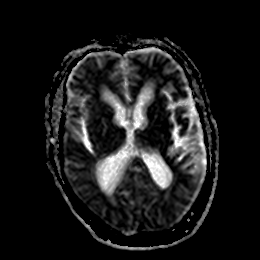
[im 37/56]
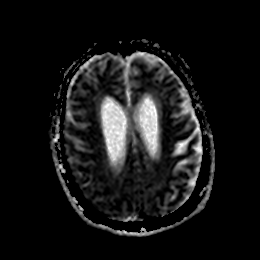
[im 46/56]
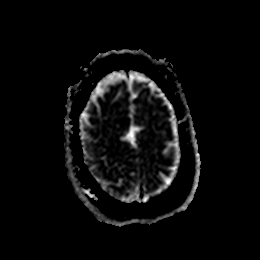
[im 56/56]
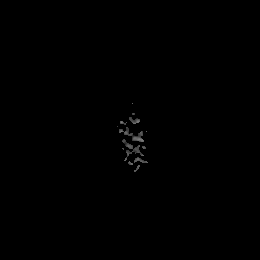

[Series 7: DWI · coronal · 4.0mm · 0.88mm/px · 12 of 88 slices shown (3 of 4)]
[im 1/88]
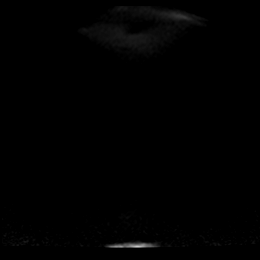
[im 8/88]
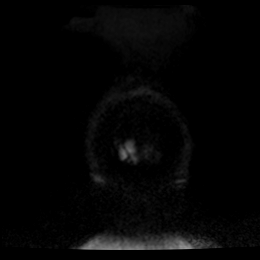
[im 16/88]
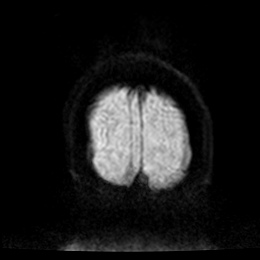
[im 24/88]
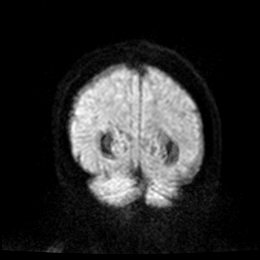
[im 32/88]
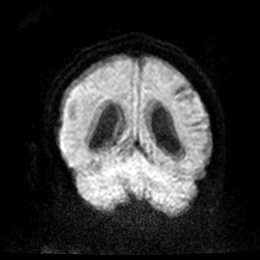
[im 40/88]
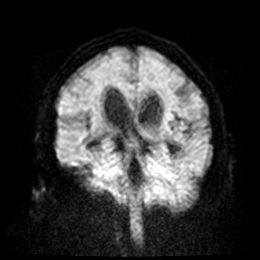
[im 48/88]
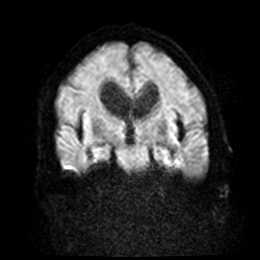
[im 56/88]
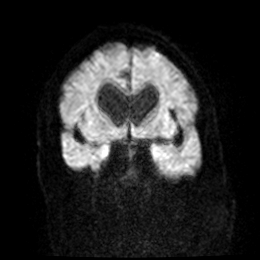
[im 64/88]
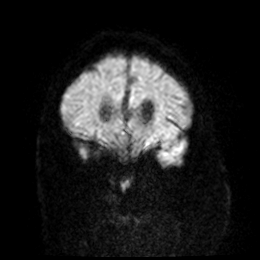
[im 72/88]
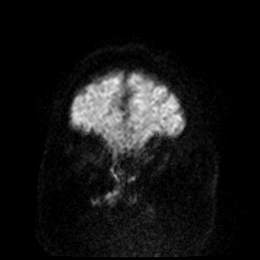
[im 80/88]
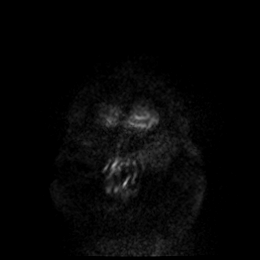
[im 88/88]
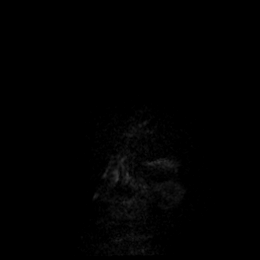

[Series 8: DWI · coronal · 4.0mm · 0.88mm/px · 6 of 44 slices shown (4 of 4)]
[im 1/44]
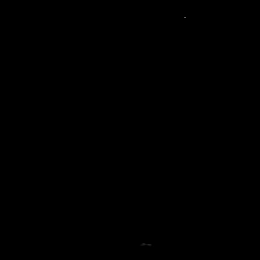
[im 9/44]
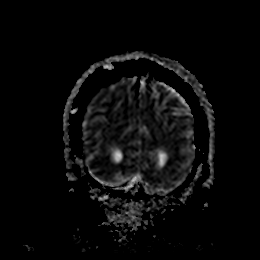
[im 18/44]
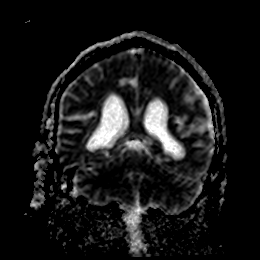
[im 26/44]
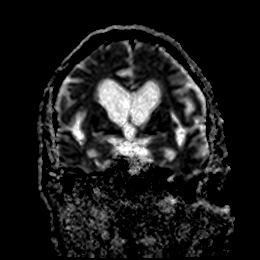
[im 35/44]
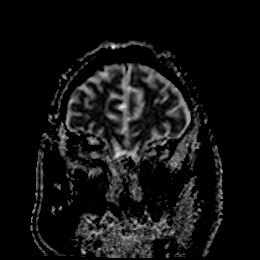
[im 44/44]
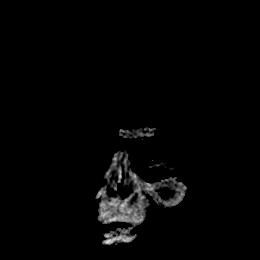

[Series 9: FLAIR · axial · 5.0mm · 0.49mm/px · z∈[-57,+104]mm · 4 of 30 slices shown]
[im 1/30]
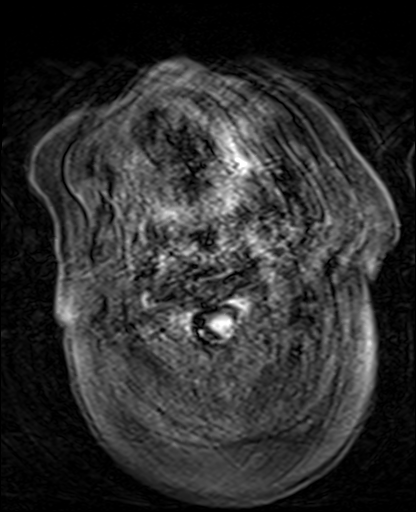
[im 10/30]
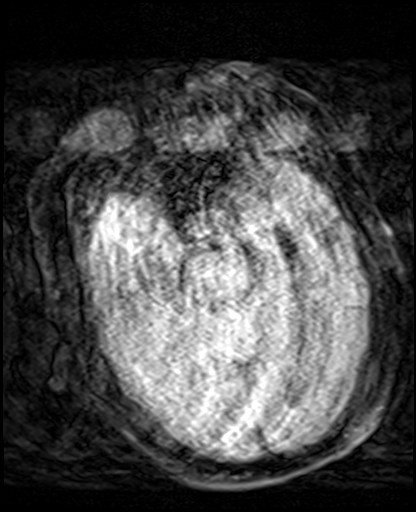
[im 20/30]
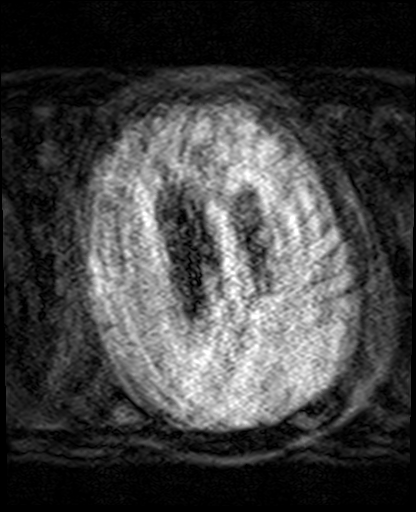
[im 30/30]
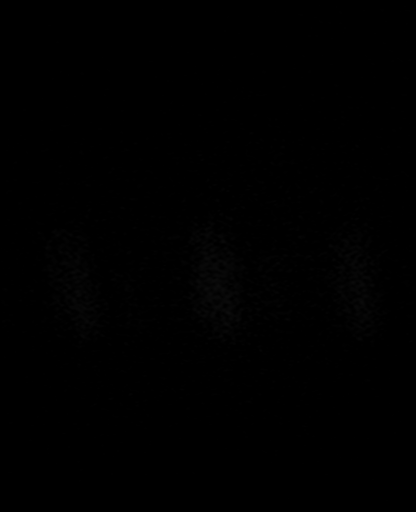

[Series 10: T1 · sagittal · 5.0mm · 0.78mm/px · 4 of 30 slices shown]
[im 1/30]
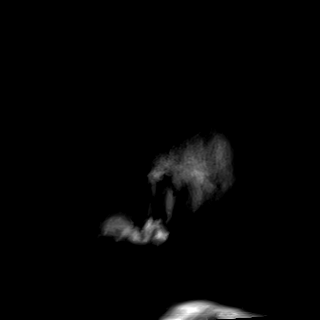
[im 10/30]
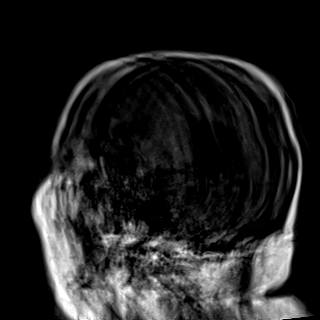
[im 20/30]
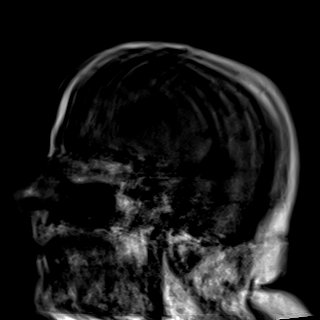
[im 30/30]
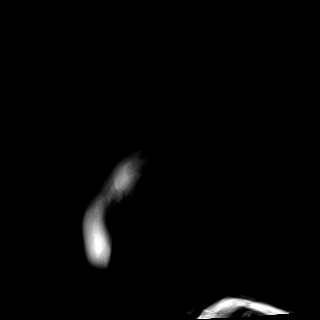

[48 of 48 positions shown; findings below may reference images not displayed]

FINDINGS: Brain: Examination is severely limited as the patient was unable to
tolerate the full length of the exam. Axial and coronal DWI weighted
sequences, with axial FLAIR and sagittal T1 weighted sequences only
were performed. The provided images are severely degraded by motion
artifact, particularly the FLAIR and T1 weighted sequence which are
centrally nondiagnostic.

Diffusion-weighted imaging demonstrates no definite evidence for
acute or subacute infarct. Gray-white matter differentiation grossly
maintained. Underlying atrophy noted. Associated ventriculomegaly
grossly stable from prior. No visible mass effect or midline shift.
No obvious mass lesion or extra-axial fluid collection.

Vascular: Not well assessed on this limited exam.

Skull and upper cervical spine: Not well assessed on this limited
exam.

Sinuses/Orbits: Not well assessed on this limited exam.

Other: None.
IMPRESSION: 1. Severely limited exam due to the patient's inability to tolerate
the full length of the study and extensive motion artifact. No
definite evidence for acute infarct.
2. The remainder of the examination is essentially nondiagnostic.

## 2021-05-16 MED ORDER — LORAZEPAM 2 MG/ML IJ SOLN
2.0000 mg | Freq: Once | INTRAMUSCULAR | Status: AC
  Administered 2021-05-16: 2 mg via INTRAVENOUS
  Filled 2021-05-16: qty 1

## 2021-05-16 MED ORDER — DILTIAZEM HCL-DEXTROSE 125-5 MG/125ML-% IV SOLN (PREMIX): 5.0000 mg/h | INTRAVENOUS | Status: DC

## 2021-05-16 MED ORDER — POTASSIUM CHLORIDE 10 MEQ/100ML IV SOLN
10.0000 meq | INTRAVENOUS | Status: AC
  Administered 2021-05-16 (×2): 10 meq via INTRAVENOUS
  Filled 2021-05-16 (×2): qty 100

## 2021-05-16 MED ORDER — DEXTROSE IN LACTATED RINGERS 5 % IV SOLN: INTRAVENOUS | Status: DC

## 2021-05-16 MED ORDER — POTASSIUM CHLORIDE CRYS ER 20 MEQ PO TBCR
40.0000 meq | EXTENDED_RELEASE_TABLET | Freq: Once | ORAL | Status: AC
  Administered 2021-05-16: 40 meq via ORAL
  Filled 2021-05-16: qty 2

## 2021-05-16 MED ORDER — METOPROLOL TARTRATE 5 MG/5ML IV SOLN
INTRAVENOUS | Status: AC
  Filled 2021-05-16: qty 5

## 2021-05-16 MED ORDER — METOPROLOL TARTRATE 25 MG PO TABS: 25.0000 mg | ORAL_TABLET | Freq: Two times a day (BID) | ORAL | Status: DC

## 2021-05-16 MED ORDER — METOPROLOL TARTRATE 5 MG/5ML IV SOLN
5.0000 mg | Freq: Four times a day (QID) | INTRAVENOUS | Status: DC | PRN
  Administered 2021-05-16 – 2021-05-17 (×2): 5 mg via INTRAVENOUS
  Filled 2021-05-16: qty 5

## 2021-05-16 MED ORDER — MAGNESIUM SULFATE 2 GM/50ML IV SOLN
2.0000 g | Freq: Once | INTRAVENOUS | Status: AC
  Administered 2021-05-16: 2 g via INTRAVENOUS
  Filled 2021-05-16: qty 50

## 2021-05-16 MED ORDER — MAGNESIUM SULFATE 4 GM/100ML IV SOLN
4.0000 g | Freq: Once | INTRAVENOUS | Status: AC
  Administered 2021-05-16: 4 g via INTRAVENOUS
  Filled 2021-05-16: qty 100

## 2021-05-16 MED ORDER — DEXTROSE 50 % IV SOLN
1.0000 | Freq: Once | INTRAVENOUS | Status: AC
  Administered 2021-05-16: 50 mL via INTRAVENOUS
  Filled 2021-05-16: qty 50

## 2021-05-16 MED ORDER — MAGNESIUM SULFATE 2 GM/50ML IV SOLN: 2.0000 g | Freq: Once | INTRAVENOUS | Status: DC

## 2021-05-16 MED ORDER — THIAMINE HCL 100 MG/ML IJ SOLN
500.0000 mg | INTRAVENOUS | Status: AC
  Administered 2021-05-16 – 2021-05-17 (×2): 500 mg via INTRAVENOUS
  Filled 2021-05-16 (×3): qty 5

## 2021-05-16 MED ORDER — POTASSIUM CHLORIDE 10 MEQ/100ML IV SOLN
10.0000 meq | INTRAVENOUS | Status: AC
Start: 2021-05-16 — End: 2021-05-16
  Administered 2021-05-16 (×4): 10 meq via INTRAVENOUS
  Filled 2021-05-16 (×4): qty 100

## 2021-05-16 NOTE — Progress Notes (Signed)
Attempted EEG. Pt became very ill started to dry heave.  Heart rate became very high. Called nurse. Staff at bedside. Will attempt EEG tomorrow when schedule permits and patient is more stable.

## 2021-05-16 NOTE — Significant Event (Signed)
Rapid Response Event Note   Reason for Call :  HR 190s  Initial Focused Assessment:  Patient is curled up on his side able to move all extremities and will follow some commands.  But he is restless  He is warm and dry  BP 120/91  RR 20-24  O2 sat 94%  rectal temp 98/5  SVT 190s  Dr Tyrell Antonio at bedside Interventions:  12 lead EKG done 5mg  lopressor given IV  HR 70s 2nd 12 lead EKG done  Plan of Care:  Per Dr Tyrell Antonio Start Cardizem gtt if HR sustains greater than 110  12 lead EKG Notify MD    Event Summary:   MD Notified: [place] Call Time: [place] Time: 2831 End Time: [place]  Raliegh Ip, RN

## 2021-05-16 NOTE — Evaluation (Addendum)
Physical Therapy Evaluation Patient Details Name: Michael Hines MRN: [id number] DOB: 12-13-1945 Today's Date: 05/16/2021  History of Present Illness  75 y.o. male presents to [facility] hospital on 05/13/2021 from ID clinic due to hypothermia and hypotension. Pt with recent hospitalization in September for MSSA septicemia, was discharged to SNF. Blood cultures negative this admission. PMH includes COPD, type 2 diabetes, hyperlipidemia, bioprosthetic aortic valve replacement.  Clinical Impression   Pt admitted secondary to problem above with deficits below. PTA patient was discharged from hospital to SNF for continued rehab. He was working on standing with RW before he began to get more confused and participated less. (Per wife).  Pt currently requires total assist for all mobility. He becomes restless with pulling at cords/wires/gown with increased stimulation of AAROM of bil LEs.  Anticipate patient may benefit from PT to address problems listed below (especially if cognition improves). Will continue to follow acutely to maximize functional mobility independence and safety.          Recommendations for follow up therapy are one component of a multi-disciplinary discharge planning process, led by the attending physician.  Recommendations may be updated based on patient status, additional functional criteria and insurance authorization.  Follow Up Recommendations SNF    Equipment Recommendations  Wheelchair (measurements PT);Wheelchair cushion (measurements PT);Hospital bed;3in1 (PT) (hoyer lift)    Recommendations for Other Services       Precautions / Restrictions Precautions Precautions: Fall      Mobility  Bed Mobility               General bed mobility comments: pt would not attempt    Transfers                 General transfer comment: unable to attempt  Ambulation/Gait                Stairs            Wheelchair Mobility    Modified Rankin  (Stroke Patients Only)       Balance                                             Pertinent Vitals/Pain Pain Assessment: Faces Faces Pain Scale: Hurts a little bit Pain Location: generalized with ROM Pain Descriptors / Indicators: Moaning Pain Intervention(s): Limited activity within patient's tolerance;Monitored during session    Home Living Family/patient expects to be discharged to:: Skilled nursing facility                 Additional Comments: Pt has been at SNF since last admission in early sept.  Prior to that, he lived with his wife and was mod I wtih ADLs and functional mobility    Prior Function Level of Independence: Needs assistance   Gait / Transfers Assistance Needed: wife report that pt has not ambulated since early sept. 2022.  She reports he has made limited progress with rehab  ADL's / Homemaking Assistance Needed: Pt has required max - total A for self care at SNF, per wife        Hand Dominance   Dominant Hand: Right    Extremity/Trunk Assessment   Upper Extremity Assessment Upper Extremity Assessment: Defer to OT evaluation    Lower Extremity Assessment Lower Extremity Assessment: RLE deficits/detail;LLE deficits/detail RLE Deficits / Details: AAROM: hip flexion 90, internal rotation to neutral;  knee flexion 110, extension 0, ankle DF neutral LLE Deficits / Details: AAROM: hip flexion 90, internal rotation to neutral; knee flexion 110, extension 0, ankle DF neutral    Cervical / Trunk Assessment Cervical / Trunk Assessment: Other exceptions Cervical / Trunk Exceptions: very rigid; HOB elevated  Communication   Communication: Other (comment) (single word answers "no" "yeah"; otherwise did not engage in conversatin)  Cognition Arousal/Alertness: Lethargic Behavior During Therapy: Restless Overall Cognitive Status: Impaired/Different from baseline Area of Impairment: Orientation;Attention;Following commands                  Orientation Level: Disoriented to;Place;Time;Situation Current Attention Level: Focused   Following Commands: Follows one step commands inconsistently       General Comments: not following commands for LE ROM, however not resisting; toward end of session became restless with pulling gown and covers off, trying to grab wires, condom catheter      General Comments General comments (skin integrity, edema, etc.): Wife present and reports pt was initially participating with rehab (standing with RW), however as he got sick he participated less and less (more confused)    Exercises Other Exercises Other Exercises: AAROM bil LEs (heelslides, hip internal rotation, ankle DF) x 10 reps each   Assessment/Plan    PT Assessment Patient needs continued PT services  PT Problem List Decreased strength;Decreased range of motion;Decreased activity tolerance;Decreased balance;Decreased mobility;Decreased cognition;Decreased safety awareness;Decreased knowledge of precautions;Impaired tone;Obesity;Pain       PT Treatment Interventions DME instruction;Gait training;Functional mobility training;Therapeutic activities;Therapeutic exercise;Balance training;Neuromuscular re-education;Cognitive remediation;Patient/family education    PT Goals (Current goals can be found in the Care Plan section)  Acute Rehab PT Goals Patient Stated Goal: wife wants pt to get well and be able to go home after rehab PT Goal Formulation: With family Time For Goal Achievement: 05/30/21 Potential to Achieve Goals: Fair    Frequency Min 2X/week   Barriers to discharge Decreased caregiver support      Co-evaluation               AM-PAC PT "6 Clicks" Mobility  Outcome Measure   05/16/21 6546  AM-PAC PT "6 Clicks" Mobility Outcome Measure (Version 2)  Help needed turning from your back to your side while in a flat bed without using bedrails? 1  Help needed moving from lying on your back to sitting on the  side of a flat bed without using bedrails? 1  Help needed moving to and from a bed to a chair (including a wheelchair)? 1  Help needed standing up from a chair using your arms (e.g., wheelchair or bedside chair)? 1  Help needed to walk in hospital room? 1  Help needed climbing 3-5 steps with a railing?  1  6 Click Score 6  Consider Recommendation of Discharge To: CIR/SNF/LTACH              End of Session   Activity Tolerance: Patient limited by lethargy;Treatment limited secondary to medical complications (Comment) (restlessness; confusion) Patient left: in bed;with call bell/phone within reach;with bed alarm set;with family/visitor present Nurse Communication: Other (comment) (allowed AAROM bil LEs) PT Visit Diagnosis: Muscle weakness (generalized) (M62.81);Adult, failure to thrive (R62.7)    Time: 5035-4656 PT Time Calculation (min) (ACUTE ONLY): 19 min   Charges:   PT Evaluation $PT Eval Low Complexity: 1 Low           Arby Barrette, PT Pager [phone]   Rexanne Mano 05/16/2021, 12:09 PM

## 2021-05-16 NOTE — Consult Note (Addendum)
Cardiology Consultation:   Patient ID: Michael Hines MRN: [id number]; DOB: 1946/04/06  Admit date: 05/13/2021 Date of Consult: 05/16/2021  PCP:  Finis Bud, MD   [place] Providers Cardiologist:  None        Patient Profile:   Michael Hines is a 75 y.o. male with a hx of COPD, T2DM, hyperlipidemia, bioprosthetic aortic valve, recent MSSA bacteremia who is being seen 05/16/2021 for the evaluation of tachycardia at the request of Dr Tyrell Antonio.Marland Kitchen  History of Present Illness:   [name] is a 75 year old male with a history of COPD, T2DM, hyperlipidemia, bioprosthetic aortic valve, recent MSSA bacteremia we are consulted to see for evaluation of tachycardia.  He was admitted from 9/2 through 04/19/2021 with MSSA bacteremia.  Presented with shortness of breath, found to be septic.  Blood cultures grew MSSA.  He was treated with IV Ancef, and discharged on 4-week course.  TEE negative for endocarditis.  At follow-up appointment with ID on 10/3, was found to be confused, tachycardic, tachypneic, and hypotensive.  He was sent to the ED.  On presentation to the ED, he was tachycardia to the 130s, hypothermic.  ID was consulted and initially started on vancomycin and cefepime.  So far no clear source of infection.  Blood cultures no growth to date.  Antibiotics have been discontinued.  Noted to have heart rate up to 190s earlier today.  Given 5 mg of IV metoprolol and was back in normal sinus rhythm.  TTE on 04/13/2021 showed EF 60 to 65%, mild LVH, grade 1 diastolic dysfunction, mild RV dysfunction, bioprosthetic AVR with findings consistent with patient prosthesis mismatch (EOA 1.16 cm, DI 0.37, AT 71 ms, mean gradient 19 mmHg).  TEE on 04/18/2021 showed 21 mm bioprosthetic aortic valve (V-max 2.6 m/s, mean gradient 15 mmHg, EOA 1.7 cm, DI 0.54), mild central AI, no definitive evidence of endocarditis, normal biventricular function, mild MR, ascending aortic aneurysm measuring 45  mm.     Past Medical History:  Diagnosis Date   Anxiety    B12 deficiency    Back pain    Diabetes mellitus without complication ([place])    Hyperlipidemia    Hypotension 05/13/2021   Hypothermia 05/13/2021   Severe sepsis with septic shock ([place]) 05/13/2021   Tachycardia 05/13/2021   Tachypnea 05/13/2021   Testosterone insufficiency     Past Surgical History:  Procedure Laterality Date   AORTIC VALVE REPLACEMENT  2013   redo bioprosthetic valve at [facility], Dr Mauricio Po   CHOLECYSTECTOMY     STRABISMUS SURGERY     TEE WITHOUT CARDIOVERSION N/A 09/07/2018   Procedure: TRANSESOPHAGEAL ECHOCARDIOGRAM (TEE);  Surgeon: Acie Fredrickson Wonda Cheng, MD;  Location: [facility] ENDOSCOPY;  Service: Cardiovascular;  Laterality: N/A;   TEE WITHOUT CARDIOVERSION N/A 04/18/2021   Procedure: TRANSESOPHAGEAL ECHOCARDIOGRAM (TEE);  Surgeon: Geralynn Rile, MD;  Location: [place];  Service: Cardiovascular;  Laterality: N/A;       Inpatient Medications: Scheduled Meds:  clotrimazole   Topical BID   enoxaparin (LOVENOX) injection  40 mg Subcutaneous Q24H   fluticasone furoate-vilanterol  1 puff Inhalation Daily   insulin aspart  0-15 Units Subcutaneous TID WC   insulin aspart  0-5 Units Subcutaneous QHS   Continuous Infusions:  dextrose 5% lactated ringers 75 mL/hr at 05/16/21 1250   diltiazem (CARDIZEM) infusion     thiamine injection     PRN Meds: acetaminophen **OR** acetaminophen, ipratropium-albuterol, metoprolol tartrate, ondansetron **OR** ondansetron (ZOFRAN) IV  Allergies:   No Known Allergies  Social History:   Social History   Socioeconomic History   Marital status: Married    Spouse name: Not on file   Number of children: Not on file   Years of education: Not on file   Highest education level: Not on file  Occupational History   Not on file  Tobacco Use   Smoking status: Former    Packs/day: 1.50    Years: 55.00    Pack years: 82.50    Types: Cigarettes    Quit date: 08/24/2018     Years since quitting: 2.7   Smokeless tobacco: Former  Scientific laboratory technician Use: Never used  Substance and Sexual Activity   Alcohol use: Not Currently   Drug use: Never   Sexual activity: Not on file  Other Topics Concern   Not on file  Social History Narrative   Not on file   Social Determinants of Health   Financial Resource Strain: Not on file  Food Insecurity: Not on file  Transportation Needs: Not on file  Physical Activity: Not on file  Stress: Not on file  Social Connections: Not on file  Intimate Partner Violence: Not on file    Family History:    Family History  Problem Relation Age of Onset   Hypertension Mother    Diabetes Mother      ROS:  Please see the history of present illness.   All other ROS reviewed and negative.     Physical Exam/Data:   Vitals:   05/16/21 0834 05/16/21 1253 05/16/21 1642 05/16/21 1706  BP: (!) 152/93 (!) 148/103  (!) 120/91  Pulse: 71 89  72  Resp: 17 20  20   Temp: 97.6 F (36.4 C) 98.6 F (37 C) 98.6 F (37 C) 98.5 F (36.9 C)  TempSrc: Axillary Oral Rectal Oral  SpO2: 94% 92%  94%  Weight:      Height:        Intake/Output Summary (Last 24 hours) at 05/16/2021 1727 Last data filed at 05/16/2021 1711 Gross per 24 hour  Intake 2265.7 ml  Output 2450 ml  Net -184.3 ml   Last 3 Weights 05/16/2021 05/15/2021 05/13/2021  Weight (lbs) 192 lb 0.3 oz 190 lb 7.6 oz 200 lb  Weight (kg) 87.1 kg 86.4 kg 90.719 kg     Body mass index is 30.07 kg/m.  General: Confused, not answering questions or following commands HEENT: normal Neck: Unable to assess JVD as would not cooperate with exam Cardiac:  RRR; 2 out of 6 systolic murmur Lungs:  clear to auscultation bilaterally, no wheezing, rhonchi or rales  Abd: soft, nontender Ext: no edema Musculoskeletal:  No deformities Skin: warm and dry  Neuro: Refuses to answer orientation questions or following commands Psych: Unable to assess  EKG:  The EKG was personally  reviewed and demonstrates: Normal sinus rhythm, rate 74, no ST abnormalities Telemetry:  Telemetry was personally reviewed and demonstrates: Episode of regular narrow complex tachycardia lasting 15 minutes, rate 190.  Currently normal sinus rhythm with rate 70s  Relevant CV Studies:   Laboratory Data:  High Sensitivity Troponin:  No results for input(s): TROPONINIHS in the last 720 hours.   Chemistry Recent Labs  Lab 05/14/21 0630 05/15/21 1225 05/16/21 0155  NA 137 133* 133*  K 3.7 3.0* 3.3*  CL 104 99 98  CO2 21* 23 21*  GLUCOSE 99 95 68*  BUN 8 <5* <5*  CREATININE 1.13 0.82 0.74  CALCIUM 8.2* 8.1* 8.2*  MG  --   --  1.1*  GFRNONAA >60 >60 >60  ANIONGAP 12 11 14     Recent Labs  Lab 05/13/21 1654 05/14/21 0630  PROT 6.3* 5.2*  ALBUMIN 2.9* 2.5*  AST 21 17  ALT <5 5  ALKPHOS 118 80  BILITOT 1.3* 1.1   Lipids No results for input(s): CHOL, TRIG, HDL, LABVLDL, LDLCALC, CHOLHDL in the last 168 hours.  Hematology Recent Labs  Lab 05/13/21 1654 05/14/21 0630 05/15/21 1225  WBC 11.3* 9.4 9.7  RBC 5.32 4.49 4.42  HGB 16.3 13.7 13.6  HCT 51.0 42.2 41.2  MCV 95.9 94.0 93.2  MCH 30.6 30.5 30.8  MCHC 32.0 32.5 33.0  RDW 15.0 14.9 14.4  PLT 244 202 196   Thyroid  Recent Labs  Lab 05/15/21 1227  TSH 1.957    BNPNo results for input(s): BNP, PROBNP in the last 168 hours.  DDimer  Recent Labs  Lab 05/13/21 2025  DDIMER 1.20*     Radiology/Studies:  DG Chest Port 1 View  Result Date: 05/13/2021 CLINICAL DATA:  Diaphoretic tachycardia EXAM: PORTABLE CHEST 1 VIEW COMPARISON:  04/12/2021 FINDINGS: Right upper extremity central venous catheter tip over the SVC. Post sternotomy changes. Cardiomegaly without focal airspace disease, effusion or pneumothorax. Small calcified lung nodules on right. Aortic atherosclerosis IMPRESSION: Cardiomegaly without focal airspace disease Electronically Signed   By: Donavan Foil M.D.   On: 05/13/2021 17:40   VAS [place] LOWER  EXTREMITY VENOUS (DVT)  Result Date: 05/15/2021  Lower Venous DVT Study Patient Name:  LONG BRIMAGE  Date of Exam:   05/15/2021 Medical Rec #: [id number]          Accession #:    [id number] Date of Birth: 1946-08-02          Patient Gender: M Patient Age:   84 years Exam Location:  [facility] Procedure:      VAS [place] LOWER EXTREMITY VENOUS (DVT) Referring Phys: Jerald Kief REGALADO --------------------------------------------------------------------------------  Indications: Elevated Ddimer.  Risk Factors: None identified. Limitations: Poor ultrasound/tissue interface and patient positioning, patient movement, poor patient cooperation. Comparison Study: No prior studies. Performing Technologist: Oliver Hum RVT  Examination Guidelines: A complete evaluation includes B-mode imaging, spectral Doppler, color Doppler, and power Doppler as needed of all accessible portions of each vessel. Bilateral testing is considered an integral part of a complete examination. Limited examinations for reoccurring indications may be performed as noted. The reflux portion of the exam is performed with the patient in reverse Trendelenburg.  +---------+---------------+---------+-----------+----------+--------------+ RIGHT    CompressibilityPhasicitySpontaneityPropertiesThrombus Aging +---------+---------------+---------+-----------+----------+--------------+ CFV      Full           Yes      Yes                                 +---------+---------------+---------+-----------+----------+--------------+ SFJ      Full                                                        +---------+---------------+---------+-----------+----------+--------------+ FV Prox  Full                                                        +---------+---------------+---------+-----------+----------+--------------+  FV Mid   Full                                                         +---------+---------------+---------+-----------+----------+--------------+ FV DistalFull                                                        +---------+---------------+---------+-----------+----------+--------------+ PFV      Full                                                        +---------+---------------+---------+-----------+----------+--------------+ POP      Full           Yes      Yes                                 +---------+---------------+---------+-----------+----------+--------------+ PTV      Full                                                        +---------+---------------+---------+-----------+----------+--------------+ PERO     Full                                                        +---------+---------------+---------+-----------+----------+--------------+   +---------+---------------+---------+-----------+----------+-------------------+ LEFT     CompressibilityPhasicitySpontaneityPropertiesThrombus Aging      +---------+---------------+---------+-----------+----------+-------------------+ CFV      Full           Yes      Yes                                      +---------+---------------+---------+-----------+----------+-------------------+ SFJ      Full                                                             +---------+---------------+---------+-----------+----------+-------------------+ FV Prox  Full                                                             +---------+---------------+---------+-----------+----------+-------------------+ FV Mid   Full                                                             +---------+---------------+---------+-----------+----------+-------------------+  FV DistalFull                                                             +---------+---------------+---------+-----------+----------+-------------------+ PFV      Full                                                              +---------+---------------+---------+-----------+----------+-------------------+ POP      Full           Yes      Yes                                      +---------+---------------+---------+-----------+----------+-------------------+ PTV      Full                                                             +---------+---------------+---------+-----------+----------+-------------------+ PERO                                                  Not well visualized +---------+---------------+---------+-----------+----------+-------------------+     Summary: RIGHT: - There is no evidence of deep vein thrombosis in the lower extremity. However, portions of this examination were limited- see technologist comments above.  - No cystic structure found in the popliteal fossa.  LEFT: - There is no evidence of deep vein thrombosis in the lower extremity. However, portions of this examination were limited- see technologist comments above.  - No cystic structure found in the popliteal fossa.  *See table(s) above for measurements and observations. Electronically signed by Harold Barban MD on 05/15/2021 at 4:52:01 PM.    Final      Assessment and Plan:   SVT: episode today of regular narrow complex tachycardia lasting 15 minutes, rate 190.  Consistent with SVT. Currently normal sinus rhythm with rate 70s -Recommend starting metoprolol 25 mg twice daily -Maintain K>4, Mag>2  Status post AVR:  recent MSSA bacteremia.  TEE on 04/18/2021 showed 21 mm bioprosthetic aortic valve (V-max 2.6 m/s, mean gradient 15 mmHg, EOA 1.7 cm, DI 0.54), mild central AI, no definitive evidence of endocarditis, normal biventricular function, mild MR, ascending aortic aneurysm measuring 45 mm. - Will check limited echo as recent bacteremia and h/o AVR, no definitive evidence of endocarditis on TEE last month but close echo follow-up recommended  For questions or updates, please contact [place] Please consult [email or web address] for contact info under    Signed, Donato Heinz, MD  05/16/2021 5:27 PM

## 2021-05-16 NOTE — Progress Notes (Addendum)
PROGRESS NOTE    Michael Hines  TDD:[id number] DOB: [birth year] DOA: 05/13/2021 PCP: Finis Bud, MD   Brief Narrative: 75 year old with past medical history significant for COPD, type 2 diabetes, hyperlipidemia, bioprosthetic aortic valve replacement and recent MSSA septicemia status post 4 weeks of IV Ancef which he will have finished 10/3 presented to the ID clinic for follow-up and found to be more confused than normal.  He is was diaphoretic and heart rate was in the 130s, temperature was 92.  They were not able to check his blood pressure.  He was transferred to the ED for further evaluation.  Patient was at a skilled nursing facility finishing up his IV antibiotic therapy.    Assessment & Plan:   Principal Problem:   Sepsis with acute organ dysfunction (HCC) Active Problems:   Diabetes mellitus without complication ([place])   S/P AVR   Diabetic polyneuropathy associated with type 2 diabetes mellitus (HCC)   Chronic bilateral low back pain without sciatica   Hypotension   Hypothermia   AKI (acute kidney injury) ([place])   Sepsis with acute organ dysfunction without septic shock ([place])  SIRS;  Patient presents with confusion, hypothermia, hypotension. Not Clear source of infection. Blood cultures no growth today.  Chest x-ray no evidence for infection. Urine culture no growth to date.  ID was consulted due to recent MSSA bacteremia.  Patient was a started on cefepime and vancomycin.  Subsequently vancomycin was discontinued.  Cefepime was discontinued 10/5.   Acute metabolic encephalopathy: Delirium, in the setting of recent infection, dehydration. Question Cefepime .  B12 984, thiamine level pending, started IV thiamine. Ammonia level 21 normal, B12 984 normal.  TSH 1.9 normal Hyponatremia , hypokalemia: Continue with IV fluids replete potassium. Discussed with neurologist Dr. Curly Shores, she recommend MRI brain, EEG, discussed with outpatient neurologist about  discontinuation of carbidopa-levodopa. Await 48-72 hours for cefepime to be out system. Consult neurologist if above test abnormal and or no improvement.   SVT, Vs A fib;  Patient had HR up 190-200.  He denies chest pain  EKG a fib RVR.  HR down to 80 with 5 mg IV metoprolol.  Repeated EKG sinus rhythm.  Cardizem Gtt to start if HR more than 110 and A fib RVR.  Check rectal tempeture.  Check B-met, Mg, troponin, ECHO.  HR down to 80, patient stable.   Type 2 diabetes: Continue with a sliding scale insulin.   Parkinsonism:  Vascular parkinsonism; Discussed with neurologist, Dr Curly Shores with neurology, she recommend I discuss with patient primary neurologist about discontinuation of carbidopa-levodopa.  I discussed with Dr Martie Round, patient's primary neurologist about holding Carbidopa-levodopa. He said we can hold for now, and he can reassess out patient needs for this medications.   Radiculopathy: Plan to hold gabapentin due to AMS.    Elevated D-dimer: Doppler lower extremity negative for DVT.    Estimated body mass index is 30.07 kg/m as calculated from the following:   Height as of this encounter: 5' 7" (1.702 m).   Weight as of this encounter: 87.1 kg.   DVT prophylaxis: Lovenox Code Status: Full code Family Communication: Wife who was at bedside Disposition Plan:  Status is: Inpatient  Remains inpatient appropriate because:IV treatments appropriate due to intensity of illness or inability to take PO  Dispo: The patient is from: SNF              Anticipated d/c is to: SNF  Patient currently is not medically stable to d/c.   Difficult to place patient No        Consultants:  ID  Procedures:    Antimicrobials:    Subjective: He keeps eyes close, say few words.  Not eating.   Objective: Vitals:   05/16/21 0335 05/16/21 0339 05/16/21 0834 05/16/21 1253  BP: (!) 159/86  (!) 152/93 (!) 148/103  Pulse: 66  71 89  Resp: _0 Temp: 98.8  F (37.1 C)  97.6 F (36.4 C) 98.6 F (37 C)  TempSrc: Axillary  Axillary Oral  SpO2: 94%  94% 92%  Weight:  87.1 kg    Height:        Intake/Output Summary (Last 24 hours) at 05/16/2021 1441 Last data filed at 05/16/2021 1324 Gross per 24 hour  Intake 3853.14 ml  Output 2450 ml  Net 1403.14 ml    Filed Weights   05/13/21 1648 05/15/21 1317 05/16/21 0339  Weight: 90.7 kg 86.4 kg 87.1 kg    Examination:  General exam: chronic ill appearing Respiratory system: CTA Cardiovascular system: S 1, S 2 RRR Gastrointestinal system: BS present, soft, nt Central nervous system: eyes close, say few words, moves extremities at times.  Extremities: no edema   Data Reviewed: I have personally reviewed following labs and imaging studies  CBC: Recent Labs  Lab 05/13/21 1654 05/14/21 0630 05/15/21 1225  WBC 11.3* 9.4 9.7  NEUTROABS 9.4* 6.4  --   HGB 16.3 13.7 13.6  HCT 51.0 42.2 41.2  MCV 95.9 94.0 93.2  PLT 244 202 643    Basic Metabolic Panel: Recent Labs  Lab 05/13/21 1654 05/14/21 0630 05/15/21 1225 05/16/21 0155  NA 135 137 133* 133*  K 3.2* 3.7 3.0* 3.3*  CL 101 104 99 98  CO2 18* 21* 23 21*  GLUCOSE 132* 99 95 68*  BUN 8 8 <5* <5*  CREATININE 1.67* 1.13 0.82 0.74  CALCIUM 8.8* 8.2* 8.1* 8.2*  MG  --   --   --  1.1*    GFR: Estimated Creatinine Clearance: 86.7 mL/min (by C-G formula based on SCr of 0.74 mg/dL). Liver Function Tests: Recent Labs  Lab 05/13/21 1654 05/14/21 0630  AST 21 17  ALT <5 5  ALKPHOS 118 80  BILITOT 1.3* 1.1  PROT 6.3* 5.2*  ALBUMIN 2.9* 2.5*    No results for input(s): LIPASE, AMYLASE in the last 168 hours. Recent Labs  Lab 05/15/21 1315  AMMONIA 21    Coagulation Profile: No results for input(s): INR, PROTIME in the last 168 hours. Cardiac Enzymes: No results for input(s): CKTOTAL, CKMB, CKMBINDEX, TROPONINI in the last 168 hours. BNP (last 3 results) No results for input(s): PROBNP in the last 8760  hours. HbA1C: No results for input(s): HGBA1C in the last 72 hours. CBG: Recent Labs  Lab 05/15/21 1641 05/15/21 2030 05/16/21 0528 05/16/21 0832 05/16/21 1247  GLUCAP 125* 79 216* 84 86    Lipid Profile: No results for input(s): CHOL, HDL, LDLCALC, TRIG, CHOLHDL, LDLDIRECT in the last 72 hours. Thyroid Function Tests: Recent Labs    05/15/21 1227  TSH 1.957    Anemia Panel: Recent Labs    05/15/21 1225  VITAMINB12 984*    Sepsis Labs: Recent Labs  Lab 05/13/21 1713 05/13/21 2025 05/14/21 0000 05/14/21 0630 05/15/21 1228  PROCALCITON  --   --  0.11 0.21 <0.10  LATICACIDVEN 2.0* 1.4  --   --   --  Recent Results (from the past 240 hour(s))  Blood Culture (routine x 2)     Status: None (Preliminary result)   Collection Time: 05/13/21  5:13 PM   Specimen: BLOOD  Result Value Ref Range Status   Specimen Description BLOOD LEFT ANTECUBITAL  Final   Special Requests   Final    BOTTLES DRAWN AEROBIC AND ANAEROBIC Blood Culture results may not be optimal due to an inadequate volume of blood received in culture bottles   Culture   Final    NO GROWTH 3 DAYS Performed at [facility], [place] [address] [place], [place] [address]    Report Status PENDING  Incomplete  Resp Panel by RT-PCR (Flu A&B, Covid) Nasopharyngeal Swab     Status: None   Collection Time: 05/13/21  5:22 PM   Specimen: Nasopharyngeal Swab; Nasopharyngeal(NP) swabs in vial transport medium  Result Value Ref Range Status   SARS Coronavirus 2 by RT PCR NEGATIVE NEGATIVE Final    Comment: (NOTE) SARS-CoV-2 target nucleic acids are NOT DETECTED.  The SARS-CoV-2 RNA is generally detectable in upper respiratory specimens during the acute phase of infection. The lowest concentration of SARS-CoV-2 viral copies this assay can detect is 138 copies/mL. A negative result does not preclude SARS-Cov-2 infection and should not be used as the sole basis for treatment or other patient management  decisions. A negative result may occur with  improper specimen collection/handling, submission of specimen other than nasopharyngeal swab, presence of viral mutation(s) within the areas targeted by this assay, and inadequate number of viral copies(<138 copies/mL). A negative result must be combined with clinical observations, patient history, and epidemiological information. The expected result is Negative.  Fact Sheet for Patients:  EntrepreneurPulse.com.au  Fact Sheet for Healthcare Providers:  IncredibleEmployment.be  This test is no t yet approved or cleared by the [place] FDA and  has been authorized for detection and/or diagnosis of SARS-CoV-2 by FDA under an Emergency Use Authorization (EUA). This EUA will remain  in effect (meaning this test can be used) for the duration of the COVID-19 declaration under Section 564(b)(1) of the Act, 21 U.S.C.section 360bbb-3(b)(1), unless the authorization is terminated  or revoked sooner.       Influenza A by PCR NEGATIVE NEGATIVE Final   Influenza B by PCR NEGATIVE NEGATIVE Final    Comment: (NOTE) The Xpert Xpress SARS-CoV-2/FLU/RSV plus assay is intended as an aid in the diagnosis of influenza from Nasopharyngeal swab specimens and should not be used as a sole basis for treatment. Nasal washings and aspirates are unacceptable for Xpert Xpress SARS-CoV-2/FLU/RSV testing.  Fact Sheet for Patients: EntrepreneurPulse.com.au  Fact Sheet for Healthcare Providers: IncredibleEmployment.be  This test is not yet approved or cleared by the [place] FDA and has been authorized for detection and/or diagnosis of SARS-CoV-2 by FDA under an Emergency Use Authorization (EUA). This EUA will remain in effect (meaning this test can be used) for the duration of the COVID-19 declaration under Section 564(b)(1) of the Act, 21 U.S.C. section 360bbb-3(b)(1), unless the  authorization is terminated or revoked.  Performed at [facility], [place] [address] [place], [place] [address]   Blood Culture (routine x 2)     Status: None (Preliminary result)   Collection Time: 05/13/21  5:39 PM   Specimen: BLOOD  Result Value Ref Range Status   Specimen Description BLOOD RIGHT ANTECUBITAL  Final   Special Requests   Final    BOTTLES DRAWN AEROBIC AND ANAEROBIC Blood Culture  results may not be optimal due to an inadequate volume of blood received in culture bottles   Culture   Final    NO GROWTH 3 DAYS Performed at [facility], [place] [address], [place], [place] [address]    Report Status PENDING  Incomplete  Urine Culture     Status: None   Collection Time: 05/15/21  1:16 PM   Specimen: Urine, Clean Catch  Result Value Ref Range Status   Specimen Description URINE, CLEAN CATCH  Final   Special Requests NONE  Final   Culture   Final    NO GROWTH Performed at [facility], [place] [address] [place], [place] [address]    Report Status 05/16/2021 FINAL  Final  MRSA Next Gen by PCR, Nasal     Status: None   Collection Time: 05/15/21  3:54 PM   Specimen: Nasal Mucosa; Nasal Swab  Result Value Ref Range Status   MRSA by PCR Next Gen NOT DETECTED NOT DETECTED Final    Comment: (NOTE) The GeneXpert MRSA Assay (FDA approved for NASAL specimens only), is one component of a comprehensive MRSA colonization surveillance program. It is not intended to diagnose MRSA infection nor to guide or monitor treatment for MRSA infections. Test performance is not FDA approved in patients less than 74 years old. Performed at [facility], [place] [address] [place], [place] [address]           Radiology Studies: VAS [place] LOWER EXTREMITY VENOUS (DVT)  Result Date: 05/15/2021  Lower Venous DVT Study Patient Name:  KEJUAN BEKKER  Date of Exam:   05/15/2021 Medical Rec #: [id number]          Accession #:    [id number] Date of Birth: August 02, 1946           Patient Gender: M Patient Age:   39 years Exam Location:  [facility] Procedure:      VAS [place] LOWER EXTREMITY VENOUS (DVT) Referring Phys: Jerald Kief  --------------------------------------------------------------------------------  Indications: Elevated Ddimer.  Risk Factors: None identified. Limitations: Poor ultrasound/tissue interface and patient positioning, patient movement, poor patient cooperation. Comparison Study: No prior studies. Performing Technologist: Oliver Hum RVT  Examination Guidelines: A complete evaluation includes B-mode imaging, spectral Doppler, color Doppler, and power Doppler as needed of all accessible portions of each vessel. Bilateral testing is considered an integral part of a complete examination. Limited examinations for reoccurring indications may be performed as noted. The reflux portion of the exam is performed with the patient in reverse Trendelenburg.  +---------+---------------+---------+-----------+----------+--------------+ RIGHT    CompressibilityPhasicitySpontaneityPropertiesThrombus Aging +---------+---------------+---------+-----------+----------+--------------+ CFV      Full           Yes      Yes                                 +---------+---------------+---------+-----------+----------+--------------+ SFJ      Full                                                        +---------+---------------+---------+-----------+----------+--------------+ FV Prox  Full                                                        +---------+---------------+---------+-----------+----------+--------------+  FV Mid   Full                                                        +---------+---------------+---------+-----------+----------+--------------+ FV DistalFull                                                        +---------+---------------+---------+-----------+----------+--------------+ PFV      Full                                                         +---------+---------------+---------+-----------+----------+--------------+ POP      Full           Yes      Yes                                 +---------+---------------+---------+-----------+----------+--------------+ PTV      Full                                                        +---------+---------------+---------+-----------+----------+--------------+ PERO     Full                                                        +---------+---------------+---------+-----------+----------+--------------+   +---------+---------------+---------+-----------+----------+-------------------+ LEFT     CompressibilityPhasicitySpontaneityPropertiesThrombus Aging      +---------+---------------+---------+-----------+----------+-------------------+ CFV      Full           Yes      Yes                                      +---------+---------------+---------+-----------+----------+-------------------+ SFJ      Full                                                             +---------+---------------+---------+-----------+----------+-------------------+ FV Prox  Full                                                             +---------+---------------+---------+-----------+----------+-------------------+ FV Mid   Full                                                             +---------+---------------+---------+-----------+----------+-------------------+  FV DistalFull                                                             +---------+---------------+---------+-----------+----------+-------------------+ PFV      Full                                                             +---------+---------------+---------+-----------+----------+-------------------+ POP      Full           Yes      Yes                                      +---------+---------------+---------+-----------+----------+-------------------+ PTV       Full                                                             +---------+---------------+---------+-----------+----------+-------------------+ PERO                                                  Not well visualized +---------+---------------+---------+-----------+----------+-------------------+     Summary: RIGHT: - There is no evidence of deep vein thrombosis in the lower extremity. However, portions of this examination were limited- see technologist comments above.  - No cystic structure found in the popliteal fossa.  LEFT: - There is no evidence of deep vein thrombosis in the lower extremity. However, portions of this examination were limited- see technologist comments above.  - No cystic structure found in the popliteal fossa.  *See table(s) above for measurements and observations. Electronically signed by Harold Barban MD on 05/15/2021 at 4:52:01 PM.    Final         Scheduled Meds:  carbidopa-levodopa  1 tablet Oral BID   clotrimazole   Topical BID   enoxaparin (LOVENOX) injection  40 mg Subcutaneous Q24H   fluticasone furoate-vilanterol  1 puff Inhalation Daily   insulin aspart  0-15 Units Subcutaneous TID WC   insulin aspart  0-5 Units Subcutaneous QHS   thiamine  100 mg Oral Daily   Continuous Infusions:  dextrose 5% lactated ringers 75 mL/hr at 05/16/21 1250     LOS: 3 days    Time spent: 35 minutes    Shanzay Hepworth A Mattison Golay, MD [facility]   If 7PM-7AM, please contact night-coverage [email or web address]  05/16/2021, 2:41 PM

## 2021-05-16 NOTE — Progress Notes (Signed)
Hypoglycemic Event  CBG: 68 (see results review- morning labs @ 0155)  Treatment: See MAR @0511 -- D50 50 mL (25 gm)  Symptoms: None (he is irritable at baseline due to medical condition)  Follow-up CBG: Time: 0528 CBG Result: 216  Possible Reasons for Event: Inadequate meal intake  Comments/MD notified: [place] MD

## 2021-05-16 NOTE — Progress Notes (Signed)
Patient glucose 79. He is not exhibiting any signs of a hypoglycemia event. Patient given 6oz of cranberry juice with medications. Glucose result likely due to poor PO intake.

## 2021-05-17 ENCOUNTER — Inpatient Hospital Stay (HOSPITAL_COMMUNITY): Payer: Medicare Other

## 2021-05-17 DIAGNOSIS — R4182 Altered mental status, unspecified: Secondary | ICD-10-CM

## 2021-05-17 DIAGNOSIS — Z952 Presence of prosthetic heart valve: Secondary | ICD-10-CM | POA: Diagnosis not present

## 2021-05-17 DIAGNOSIS — G9341 Metabolic encephalopathy: Secondary | ICD-10-CM | POA: Diagnosis not present

## 2021-05-17 DIAGNOSIS — A419 Sepsis, unspecified organism: Secondary | ICD-10-CM | POA: Diagnosis not present

## 2021-05-17 DIAGNOSIS — R0609 Other forms of dyspnea: Secondary | ICD-10-CM

## 2021-05-17 DIAGNOSIS — N179 Acute kidney failure, unspecified: Secondary | ICD-10-CM | POA: Diagnosis not present

## 2021-05-17 DIAGNOSIS — R6520 Severe sepsis without septic shock: Secondary | ICD-10-CM

## 2021-05-17 DIAGNOSIS — I471 Supraventricular tachycardia: Secondary | ICD-10-CM | POA: Diagnosis not present

## 2021-05-17 LAB — GLUCOSE, CAPILLARY
Glucose-Capillary: 119 mg/dL — ABNORMAL HIGH (ref 70–99)
Glucose-Capillary: 111 mg/dL — ABNORMAL HIGH (ref 70–99)
Glucose-Capillary: 137 mg/dL — ABNORMAL HIGH (ref 70–99)
Glucose-Capillary: 109 mg/dL — ABNORMAL HIGH (ref 70–99)

## 2021-05-17 LAB — BASIC METABOLIC PANEL
Anion gap: 10 (ref 5–15)
BUN: <5 mg/dL — ABNORMAL LOW (ref 8–23)
CO2: 23 mmol/L (ref 22–32)
Calcium: 8.4 mg/dL — ABNORMAL LOW (ref 8.9–10.3)
Chloride: 97 mmol/L — ABNORMAL LOW (ref 98–111)
Creatinine, Ser: 0.88 mg/dL (ref 0.61–1.24)
GFR, Estimated: >60 mL/min (ref >60)
Glucose, Bld: 108 mg/dL — ABNORMAL HIGH (ref 70–99)
Potassium: 4 mmol/L (ref 3.5–5.1)
Sodium: 130 mmol/L — ABNORMAL LOW (ref 135–145)

## 2021-05-17 LAB — MAGNESIUM: Magnesium: 1.8 mg/dL (ref 1.7–2.4)

## 2021-05-17 LAB — BLOOD GAS, ARTERIAL
Acid-Base Excess: 0.5 mmol/L (ref 0.0–2.0)
Bicarbonate: 23.9 mmol/L (ref 20.0–28.0)
Drawn by: 560021
FIO2: 21
O2 Saturation: 93.1 %
Patient temperature: 37
pCO2 arterial: 34.3 mmHg (ref 32.0–48.0)
pH, Arterial: 7.457 — ABNORMAL HIGH (ref 7.350–7.450)
pO2, Arterial: 65.5 mmHg — ABNORMAL LOW (ref 83.0–108.0)

## 2021-05-17 LAB — ECHOCARDIOGRAM LIMITED
AR max vel: 0.88 cm2
AV Area VTI: 0.84 cm2
AV Area mean vel: 0.84 cm2
AV Mean grad: 10 mmHg
AV Peak grad: 19.4 mmHg
Ao pk vel: 2.2 m/s
Area-P 1/2: 1.98 cm2
Height: 67 in
S' Lateral: 2.9 cm
Weight: 2998.26 oz

## 2021-05-17 LAB — CBC
HCT: 45.8 % (ref 39.0–52.0)
Hemoglobin: 15.5 g/dL (ref 13.0–17.0)
MCH: 30.7 pg (ref 26.0–34.0)
MCHC: 33.8 g/dL (ref 30.0–36.0)
MCV: 90.7 fL (ref 80.0–100.0)
Platelets: 235 10*3/uL (ref 150–400)
RBC: 5.05 MIL/uL (ref 4.22–5.81)
RDW: 14.3 % (ref 11.5–15.5)
WBC: 10.7 10*3/uL — ABNORMAL HIGH (ref 4.0–10.5)
nRBC: 0 % (ref 0.0–0.2)

## 2021-05-17 IMAGING — DX DG CHEST 1V PORT
1 series · 1 of 1 positions shown · non-contrast
Comparison: [DATE] chest radiograph.

CLINICAL DATA: Altered mental status, hypoxia

EXAM:
PORTABLE CHEST 1 VIEW

[chest]
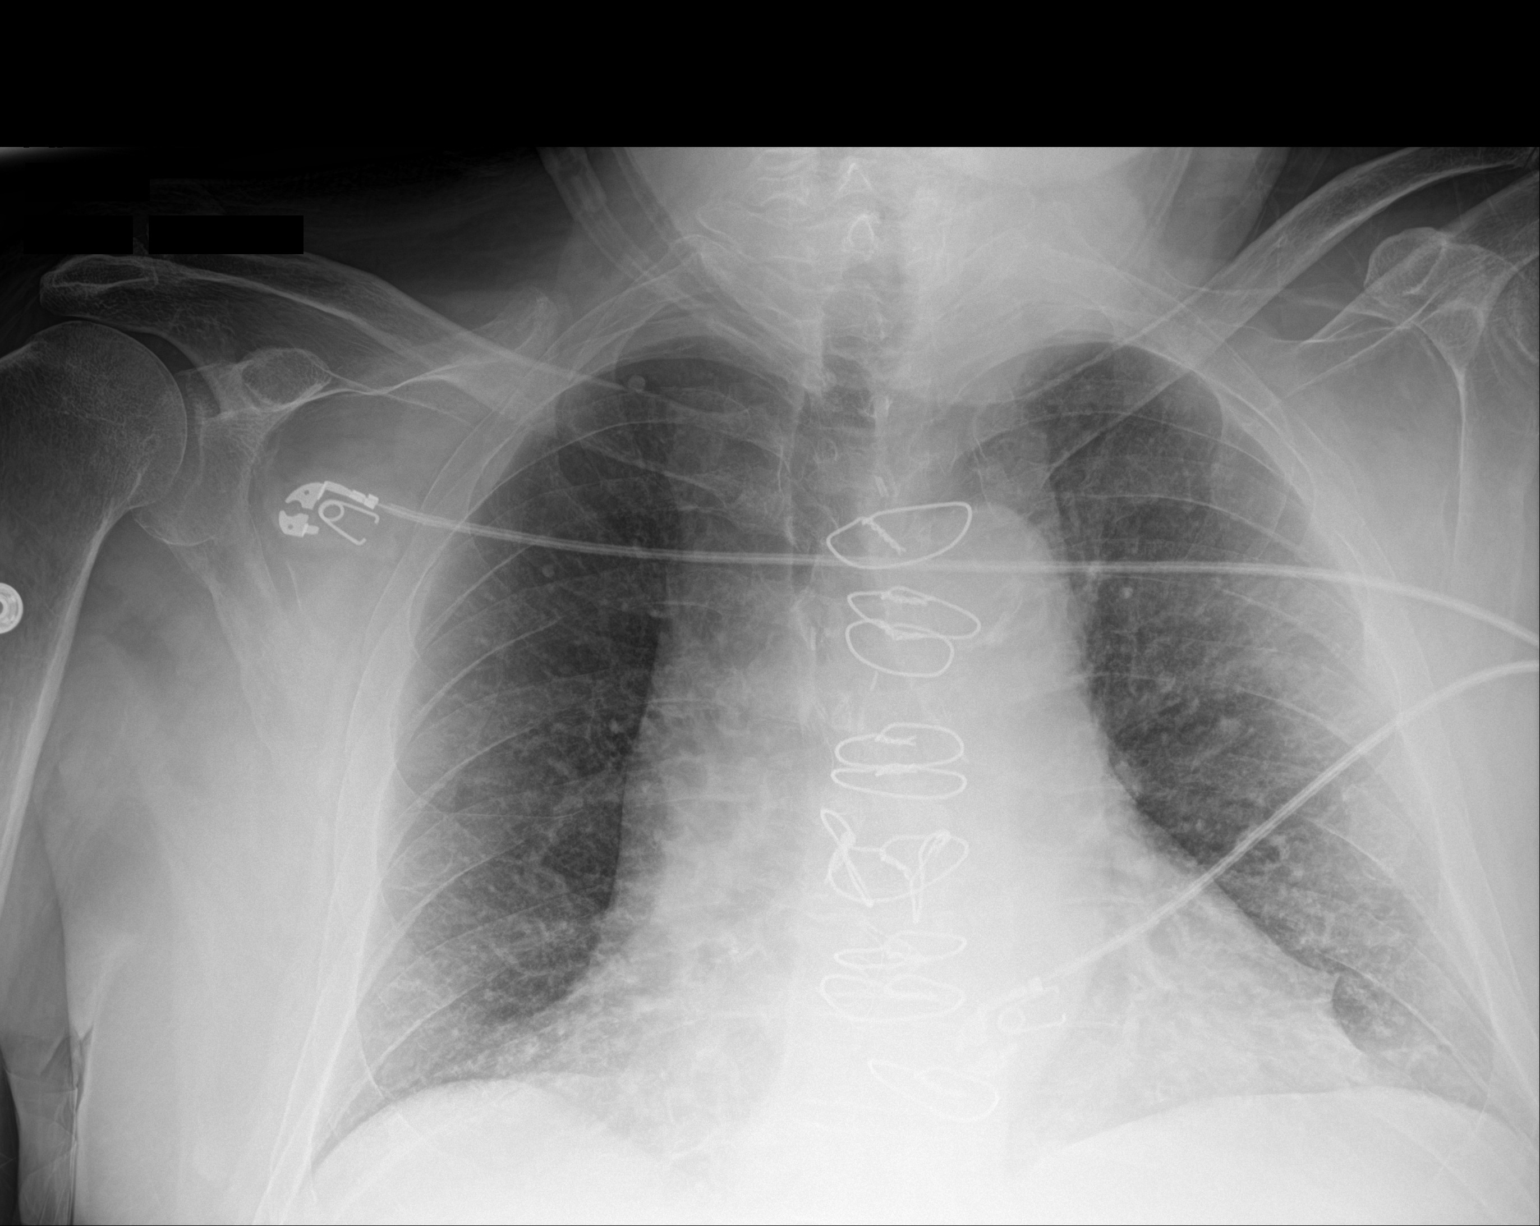

[1 of 1 positions shown; findings below may reference images not displayed]

FINDINGS: Intact sternotomy wires. Stable cardiomediastinal silhouette with
mild cardiomegaly and aortic valve prosthesis in place. No
pneumothorax. No pleural effusion. No rib pain edema. No acute
consolidative airspace disease.
IMPRESSION: Mild cardiomegaly without overt pulmonary edema. No active pulmonary
disease.

## 2021-05-17 MED ORDER — IOHEXOL 350 MG/ML SOLN
100.0000 mL | Freq: Once | INTRAVENOUS | Status: AC | PRN
  Administered 2021-05-17: 100 mL via INTRAVENOUS

## 2021-05-17 MED ORDER — QUETIAPINE FUMARATE 25 MG PO TABS
25.0000 mg | ORAL_TABLET | ORAL | Status: DC | PRN
  Filled 2021-05-17: qty 1

## 2021-05-17 MED ORDER — MAGNESIUM SULFATE 2 GM/50ML IV SOLN
2.0000 g | Freq: Once | INTRAVENOUS | Status: AC
  Administered 2021-05-17: 2 g via INTRAVENOUS
  Filled 2021-05-17: qty 50

## 2021-05-17 MED ORDER — METOPROLOL TARTRATE 5 MG/5ML IV SOLN
2.5000 mg | Freq: Four times a day (QID) | INTRAVENOUS | Status: DC
  Administered 2021-05-17 – 2021-05-20 (×12): 2.5 mg via INTRAVENOUS
  Filled 2021-05-17 (×12): qty 5

## 2021-05-17 MED ORDER — DEXTROSE-NACL 5-0.9 % IV SOLN: INTRAVENOUS | Status: DC

## 2021-05-17 NOTE — Progress Notes (Signed)
EEG complete - results pending 

## 2021-05-17 NOTE — Consult Note (Signed)
Neurology Consultation  Reason for Consult: Acute encephalopathy Referring Physician: Dr. Tyrell Antonio  CC: Confusion  History is obtained from: Chart review, Patient's wife at bedside, unable to obtain from patient due to patient's mental status  HPI: Michael Hines is a 75 y.o. male medical history significant for type 2 diabetes mellitus, Parkinsonism, hyperlipidemia, hypotension, vitamin B12 deficiency, COPD, bioprosthetic aortic valve replacement, and recent MSSA septicemia 04/12/2021 without clear source for bacteremia s/p 4 weeks of IV Ancef with plans to complete IV antibiotics on 10/3 who presented to the ID clinic 10/3 with confusion, tachycardia, tachypnea, hypothermia, and hypotension. He was sent to the ED via EMS from the ID clinic with concern for septic shock. Initial work up in the ED revealed patient to be diaphoretic, hypothermic with an initial temperature of 92 F, hypotensive with a systolic blood pressure of 98 mmHg, tachycardic with a heart rate elevation to the 130's, lactic acid elevation of 2.0, leukocytosis with a white blood cell count of 11.3, creatinine elevation from baseline, myalgias, and patient was actively vomiting while in the ED with severe sepsis and acute organ dysfunction with unknown primary source. Patient was started on cefepime and vancomycin per ID and his PICC line was removed 10/4. Of note, Michael Hines has been staying at [facility] over the past 4 weeks in order to obtain his IV antibiotics and has had a noted decrease in appetite since his previous hospitalization with MSSA septicemia. Blood cultures were without growth and patient remained afebrile and antibiotics were discontinued 05/15/2021. Due to persistent altered mental status, neurology was consulted for further patient evaluation.   Patient's wife states that his current state of confusion began in September with his first infection and has persisted since. She does state that she has noticed a  decreased appetite and PO intake since before his initial MSSA infection. She states that his mental status waxes and wanes and that he has been hypersomnolent but that this has been persistent since his initial hospitalization since 04/12/2021 with infection. She states that while at the facility for IV antibiotics, he would call her at midnight ans ask where she was and be confused about his own location, telling her he was in [place]. She does note that his mental status has been even worse since his current hospitalization.   At baseline he has mild memory issues with some repetitive questioning first noted approximately 6 months ago by patient's wife who noticed that he would ask repeatedly the same questions, sometimes would realize this but other times not. He last drove about 2 months ago, and has not had any episodes of getting lost while driving, though she has never felt comfortable with his driving due to the fact that she herself is a very cautious driver she does not feel like his driving was generally declining.  Regarding classic nonmotor symptoms of primary Parkinson's disease, she states that he sometimes talks in his sleep but she has not noticed any dream enactment concerning for REM sleep behavior disorder or anosmia, however, patient's wife does endorse frequent constipation for which he would frequently use suppositories. She states that he has been diagnosed with OSA and recommended to use CPAP but that he did not want to. She also states that he was put on Sinemet for his parkinsonism features of tremor but that he usually took a lower dose than was prescribed due to side effects of GI upset and then stopped taking it altogether. She states that she did not notice  any improvement in his tremors since initiation of Sinemet, nor when the patient re-trialed it.  ROS: Unable to obtain due to altered mental status.   Past Medical History:  Diagnosis Date   Anxiety    B12 deficiency     Back pain    Diabetes mellitus without complication ([place])    Hyperlipidemia    Hypotension 05/13/2021   Hypothermia 05/13/2021   Severe sepsis with septic shock ([place]) 05/13/2021   Tachycardia 05/13/2021   Tachypnea 05/13/2021   Testosterone insufficiency    Past Surgical History:  Procedure Laterality Date   AORTIC VALVE REPLACEMENT  2013   redo bioprosthetic valve at [facility], Dr Mauricio Po   CHOLECYSTECTOMY     STRABISMUS SURGERY     TEE WITHOUT CARDIOVERSION N/A 09/07/2018   Procedure: TRANSESOPHAGEAL ECHOCARDIOGRAM (TEE);  Surgeon: Acie Fredrickson Wonda Cheng, MD;  Location: [facility] ENDOSCOPY;  Service: Cardiovascular;  Laterality: N/A;   TEE WITHOUT CARDIOVERSION N/A 04/18/2021   Procedure: TRANSESOPHAGEAL ECHOCARDIOGRAM (TEE);  Surgeon: Geralynn Rile, MD;  Location[facility] ENDOSCOPY;  Service: Cardiovascular;  Laterality: N/A;   Family History  Problem Relation Age of Onset   Hypertension Mother    Diabetes Mother    Social History:   reports that he quit smoking about 2 years ago. His smoking use included cigarettes. He has a 82.50 pack-year smoking history. He has quit using smokeless tobacco. He reports that he does not currently use alcohol. He reports that he does not use drugs. Medications  Current Facility-Administered Medications:    acetaminophen (TYLENOL) tablet 650 mg, 650 mg, Oral, Q6H PRN **OR** acetaminophen (TYLENOL) suppository 650 mg, 650 mg, Rectal, Q6H PRN, Kristopher Oppenheim, DO   clotrimazole (LOTRIMIN) 1 % cream, , Topical, BID, Kristopher Oppenheim, DO, Given at 05/14/21 1005   dextrose 5 %-0.9 % sodium chloride infusion, , Intravenous, Continuous, Regalado, Belkys A, MD   enoxaparin (LOVENOX) injection 40 mg, 40 mg, Subcutaneous, Q24H, Kristopher Oppenheim, DO, 40 mg at 05/14/21 1015   fluticasone furoate-vilanterol (BREO ELLIPTA) 100-25 MCG/INH 1 puff, 1 puff, Inhalation, Daily, Kristopher Oppenheim, DO, 1 puff at 05/17/21 0738   insulin aspart (novoLOG) injection 0-15 Units, 0-15 Units, Subcutaneous, TID WC,  Kristopher Oppenheim, DO, 5 Units at 05/16/21 1743   insulin aspart (novoLOG) injection 0-5 Units, 0-5 Units, Subcutaneous, QHS, Bridgett Larsson, Eric, DO   ipratropium-albuterol (DUONEB) 0.5-2.5 (3) MG/3ML nebulizer solution 3 mL, 3 mL, Nebulization, Q6H PRN, Kristopher Oppenheim, DO   magnesium sulfate IVPB 2 g 50 mL, 2 g, Intravenous, Once, Regalado, Belkys A, MD   metoprolol tartrate (LOPRESSOR) injection 2.5 mg, 2.5 mg, Intravenous, Q6H, Donato Heinz, MD   ondansetron (ZOFRAN) tablet 4 mg, 4 mg, Oral, Q6H PRN **OR** ondansetron (ZOFRAN) injection 4 mg, 4 mg, Intravenous, Q6H PRN, Kristopher Oppenheim, DO, 4 mg at 05/17/21 0531   thiamine 500mg  in normal saline (66ml) IVPB, 500 mg, Intravenous, Q24H, Regalado, Belkys A, MD, Last Rate: 100 mL/hr at 05/16/21 1738, 500 mg at 05/16/21 1738  Exam: Current vital signs: BP 122/78 (BP Location: Right Arm)   Pulse 77   Temp 98.6 F (37 C) (Oral)   Resp 19   Ht 5\' 7"  (1.702 m)   Wt 85 kg   SpO2 96%   BMI 29.35 kg/m  Vital signs in last 24 hours: Temp:  [98 F (36.7 C)-99.2 F (37.3 C)] 98.6 F (37 C) (10/07 0747) Pulse Rate:  [51-89] 77 (10/07 0747) Resp:  [17-21] 19 (10/07 0747) BP: (113-148)/(53-103) 122/78 (10/07 0747) SpO2:  [92 %-  96 %] 96 % (10/07 0747) Weight:  [85 kg] 85 kg (10/07 0459)  GENERAL: Laying comfortably in hospital bed, in no acute distress Psych: Unable to assess due to patient's mentation. He is drowsy and becomes agitated with stimulation. He is not cooperative with examination.  Head: Normocephalic and atraumatic, without obvious abnormality EENT: Normal conjunctivae, dry mucous membranes, no OP obstruction, poor dentition LUNGS: Normal respiratory effort. Non-labored breathing on room air. Patient does often desaturate with an SpO2 of 87% when sleeping.  CV: Bradycardia with a heart rate into the high 40's during assessment. No pedal edema noted.  ABDOMEN: Soft, non-tender, non-distended Extremities: warm, well perfused, without obvious  deformity  NEURO:  Mental Status: Lethargic, does not open eyes to stimulation.  He does not follow commands He has very minimal speech output and only states "quit" "no" and "I don't know" throughout assessment He does not answer orientation questions, does not attempt to name objects, does not attempt to repeat phrases. No neglect is noted Cranial Nerves:  II: PERRL 4 mm/brisk.  III, IV, VI: He does not open eyes, does not fixate or track examiner.  V: Blinks to threat throughout VII: Face is symmetric resting and with movement VIII: Hearing is intact to voice IX, X: Does not open mouth for visualization of palate XI: Head appears midline XII: Patient does not protrude tongue to command Motor: Formal strength assessment limited by patient cooperation.  He moves BUE purposefully with some antigravity movement. He does not follow commands, does not squeeze to command. He does withdraw each extremity with light tickle throughout.  BLE withdraw briskly without antigravity movement.   Tone is slightly increased in BUE with some cogwheel rigidity. Bulk is normal.  Sensation: Withdraws to tickle throughout Coordination: Unable to assess, patient does not follow commands DTRs: 3+ and symmetric throughout.  Gait: Deferred for patient safety  Labs I have reviewed labs in epic and the results pertinent to this consultation are: CBC    Component Value Date/Time   WBC 10.7 (H) 05/17/2021 0726   RBC 5.05 05/17/2021 0726   HGB 15.5 05/17/2021 0726   HCT 45.8 05/17/2021 0726   PLT 235 05/17/2021 0726   MCV 90.7 05/17/2021 0726   MCH 30.7 05/17/2021 0726   MCHC 33.8 05/17/2021 0726   RDW 14.3 05/17/2021 0726   LYMPHSABS 1.2 05/14/2021 0630   MONOABS 0.8 05/14/2021 0630   EOSABS 0.5 05/14/2021 0630   BASOSABS 0.1 05/14/2021 0630   CMP     Component Value Date/Time   NA 130 (L) 05/17/2021 0726   K 4.0 05/17/2021 0726   CL 97 (L) 05/17/2021 0726   CO2 23 05/17/2021 0726   GLUCOSE  108 (H) 05/17/2021 0726   BUN <5 (L) 05/17/2021 0726   CREATININE 0.88 05/17/2021 0726   CALCIUM 8.4 (L) 05/17/2021 0726   PROT 5.2 (L) 05/14/2021 0630   ALBUMIN 2.5 (L) 05/14/2021 0630   AST 17 05/14/2021 0630   ALT 5 05/14/2021 0630   ALKPHOS 80 05/14/2021 0630   BILITOT 1.1 05/14/2021 0630   GFRNONAA >60 05/17/2021 0726   GFRAA >60 12/04/2019 2042   Urinalysis    Component Value Date/Time   COLORURINE YELLOW 05/13/2021 1654   APPEARANCEUR HAZY (A) 05/13/2021 1654   LABSPEC 1.018 05/13/2021 1654   PHURINE 6.0 05/13/2021 1654   GLUCOSEU NEGATIVE 05/13/2021 1654   HGBUR SMALL (A) 05/13/2021 1654   BILIRUBINUR NEGATIVE 05/13/2021 1654   KETONESUR 20 (A) 05/13/2021 1654   PROTEINUR  30 (A) 05/13/2021 1654   NITRITE NEGATIVE 05/13/2021 1654   LEUKOCYTESUR NEGATIVE 05/13/2021 1654   Results for orders placed or performed during the hospital encounter of 05/13/21  Blood Culture (routine x 2)     Status: None (Preliminary result)   Collection Time: 05/13/21  5:13 PM   Specimen: BLOOD  Result Value Ref Range Status   Specimen Description BLOOD LEFT ANTECUBITAL  Final   Special Requests   Final    BOTTLES DRAWN AEROBIC AND ANAEROBIC Blood Culture results may not be optimal due to an inadequate volume of blood received in culture bottles   Culture   Final    NO GROWTH 4 DAYS Performed at [facility], [place] [address], [place], [place] [address]    Report Status PENDING  Incomplete  Resp Panel by RT-PCR (Flu A&B, Covid) Nasopharyngeal Swab     Status: None   Collection Time: 05/13/21  5:22 PM   Specimen: Nasopharyngeal Swab; Nasopharyngeal(NP) swabs in vial transport medium  Result Value Ref Range Status   SARS Coronavirus 2 by RT PCR NEGATIVE NEGATIVE Final    Comment: (NOTE) SARS-CoV-2 target nucleic acids are NOT DETECTED.  The SARS-CoV-2 RNA is generally detectable in upper respiratory specimens during the acute phase of infection. The lowest concentration of  SARS-CoV-2 viral copies this assay can detect is 138 copies/mL. A negative result does not preclude SARS-Cov-2 infection and should not be used as the sole basis for treatment or other patient management decisions. A negative result may occur with  improper specimen collection/handling, submission of specimen other than nasopharyngeal swab, presence of viral mutation(s) within the areas targeted by this assay, and inadequate number of viral copies(<138 copies/mL). A negative result must be combined with clinical observations, patient history, and epidemiological information. The expected result is Negative.  Fact Sheet for Patients:  EntrepreneurPulse.com.au  Fact Sheet for Healthcare Providers:  IncredibleEmployment.be  This test is no t yet approved or cleared by the [place] FDA and  has been authorized for detection and/or diagnosis of SARS-CoV-2 by FDA under an Emergency Use Authorization (EUA). This EUA will remain  in effect (meaning this test can be used) for the duration of the COVID-19 declaration under Section 564(b)(1) of the Act, 21 U.S.C.section 360bbb-3(b)(1), unless the authorization is terminated  or revoked sooner.       Influenza A by PCR NEGATIVE NEGATIVE Final   Influenza B by PCR NEGATIVE NEGATIVE Final    Comment: (NOTE) The Xpert Xpress SARS-CoV-2/FLU/RSV plus assay is intended as an aid in the diagnosis of influenza from Nasopharyngeal swab specimens and should not be used as a sole basis for treatment. Nasal washings and aspirates are unacceptable for Xpert Xpress SARS-CoV-2/FLU/RSV testing.  Fact Sheet for Patients: EntrepreneurPulse.com.au  Fact Sheet for Healthcare Providers: IncredibleEmployment.be  This test is not yet approved or cleared by the [place] FDA and has been authorized for detection and/or diagnosis of SARS-CoV-2 by FDA under an Emergency Use  Authorization (EUA). This EUA will remain in effect (meaning this test can be used) for the duration of the COVID-19 declaration under Section 564(b)(1) of the Act, 21 U.S.C. section 360bbb-3(b)(1), unless the authorization is terminated or revoked.  Performed at [facility], [place] [address], [place],  [address]   Blood Culture (routine x 2)     Status: None (Preliminary result)   Collection Time: 05/13/21  5:39 PM   Specimen: BLOOD  Result Value Ref Range Status   Specimen  Description BLOOD RIGHT ANTECUBITAL  Final   Special Requests   Final    BOTTLES DRAWN AEROBIC AND ANAEROBIC Blood Culture results may not be optimal due to an inadequate volume of blood received in culture bottles   Culture   Final    NO GROWTH 4 DAYS Performed at [facility], [place] [address], [place], [place] [address]    Report Status PENDING  Incomplete  Urine Culture     Status: None   Collection Time: 05/15/21  1:16 PM   Specimen: Urine, Clean Catch  Result Value Ref Range Status   Specimen Description URINE, CLEAN CATCH  Final   Special Requests NONE  Final   Culture   Final    NO GROWTH Performed at [facility], [place] [address], [place], [place] [address]    Report Status 05/16/2021 FINAL  Final  MRSA Next Gen by PCR, Nasal     Status: None   Collection Time: 05/15/21  3:54 PM   Specimen: Nasal Mucosa; Nasal Swab  Result Value Ref Range Status   MRSA by PCR Next Gen NOT DETECTED NOT DETECTED Final    Comment: (NOTE) The GeneXpert MRSA Assay (FDA approved for NASAL specimens only), is one component of a comprehensive MRSA colonization surveillance program. It is not intended to diagnose MRSA infection nor to guide or monitor treatment for MRSA infections. Test performance is not FDA approved in patients less than 50 years old. Performed at [facility], [place] [address], [place], [place] [address]    Ammonia 9 - 35 umol/L 21   Lab Results  Component Value Date    [id number] 563 (H) 05/15/2021   Lab Results  Component Value Date   TSH 1.957 05/15/2021   Imaging I have reviewed the images obtained:  MRI examination of the brain 05/17/2021: 1. Severely limited exam due to the patient's inability to tolerate the full length of the study and extensive motion artifact. No definite evidence for acute infarct. 2. The remainder of the examination is essentially nondiagnostic.  MRI brain 02/06/2020 1.  No acute intracranial abnormality. 2. Lateral and 3rd ventricular enlargement felt to be ex vacuo in nature. No areas of disproportion cerebral atrophy identified. 3. Mild to moderate cerebral white matter and pontine signal changes, nonspecific but most commonly due to small vessel disease. 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  Assessment: 75 y.o. male who presented to the ED 10/3 for evaluation of confusion, diaphoresis, hypothermia, tachycardia, and some hypotension follow 4 weeks of IV antibiotics for MSSA septicemia without clear source. Per patient's wife, he has been confused since his hospitalization on 04/13/2021 without return to baseline and with waxing and waning mental status.  - Examination reveals patient is lethargic, does not follow commands, and does not answer orientation questions.  Per patient's wife, patient's mental status waxes and wanes and there are periods that he does have some clarity but overall he has been more hypersomnolent. - MRI examination severely limited due to patient intolerance and is nondiagnostic. - Sinemet held as it may contribute to her altered mental status and wife reports little to no benefit; without classic nonmotor symptoms of Parkinson's disease, do feel that a diagnosis of primary Parkinson's disease is less likely and therefore Sinemet is less likely to be of benefit though it can sometimes help in other forms of parkinsonism -- Nonadherence with CPAP with multiple  episodes of desaturations witnessed while  patient was sleeping may be additionally contributing to the patient's poor mental status - Differential diagnosis includes prolonged toxic-metabolic / infectious encephalopathy versus delirium in the setting of infection, metabolic derangements, and recent environmental changes with or without vitamin deficiencies in the setting of impaired PO intake. These are likely to be superimposed on patient's history of suspected vascular dementia with previous MRI brain imaging revealing white matter and pontine signal changes attributed to small vessel disease. It is likely that his presentation is multifactorial with a combination of those listed above.   Recommendations: - Repeat MRI brain with sedation for tolerance; can consider Seroquel or olanzapine for sedation as Ativan had a paradoxical agitation effect on patient per wife's report - Follow up on thiamine level - Consider CPAP for patient with frequent desaturating events although it is likely he will refuse given non-adherence at home - Try to minimize deliriogenic medications as much as possible (J Am Geriatr Soc. 2012 Apr;60(4):616-31): benzodiazepines, anticholinergics, diphenhydramine, antihistamines, narcotics, Ambien/Lunesta/Sonata etc. - Environmental support for delirium: Lights on during the day, patient up and out of bed as much as is feasible, OT/PT, quiet dimly lit room at night, reorient patient often, provide hearing aides and glasses if patient uses them routinely, minimize sleep disruptions as much as possible overnight.   - Continue to look for and treat possible modifiable risk factors for delirium. These include deliriogenic medications, infection (CBC/diff, UA, pan culture, CXR, and evaluation of the surgical site), metabolic derrangement (CMP, TSH/fT4), organ failure (uremia, hepatic profile, ABG), dehydration, malnutrition (check B1, B12; please replete thiamine), surgery/sedation,  immobility/physical restraints, sensory impairments (vison/hearing), sleep deprivation, pain, and drug withdrawal or intoxication. - Can consider low dose seroquel (12.5 to 25 mg nightly PRN) as needed while monitoring QTc to reduce aggressive behavior, uptitrating slowly as needed.  Pt seen by NP/Neuro and later by MD. Note/plan to be edited by MD as needed.  Anibal Henderson, AGAC-NP [facility] Pager: [phone]  Attending Neurologist's note:  Presentation most consistent with hypoactive delirium in the setting of multiple medical problems, on a possible background of dementia given memory impairments predating illness.  However given concern for potential bacterial endocarditis, does need a more diagnostic MRI brain to effectively rule out new structural lesion such as stroke which would change management.  Full recommendations as above  I personally saw this patient, gathering history, performing a full neurologic examination, reviewing relevant labs, personally reviewing relevant imaging including extremely motion limited MRI brain, and formulated the assessment and plan, adding the note above for completeness and clarity to accurately reflect my thoughts  Lesleigh Noe MD-PhD [facility] [phone]  Available 7 AM to 7 PM, outside these hours please contact Neurologist on call listed on AMION

## 2021-05-17 NOTE — Progress Notes (Addendum)
Progress Note  Patient Name: Michael Hines Date of Encounter: 05/17/2021  [facility] Cardiologist: None   Subjective   Not answering questions or following commands.  Has been refusing p.o. medications  Inpatient Medications    Scheduled Meds:  clotrimazole   Topical BID   enoxaparin (LOVENOX) injection  40 mg Subcutaneous Q24H   fluticasone furoate-vilanterol  1 puff Inhalation Daily   insulin aspart  0-15 Units Subcutaneous TID WC   insulin aspart  0-5 Units Subcutaneous QHS   metoprolol tartrate  25 mg Oral BID   Continuous Infusions:  dextrose 5% lactated ringers 75 mL/hr at 05/16/21 2108   thiamine injection 500 mg (05/16/21 1738)   PRN Meds: acetaminophen **OR** acetaminophen, ipratropium-albuterol, metoprolol tartrate, ondansetron **OR** ondansetron (ZOFRAN) IV   Vital Signs    Vitals:   05/17/21 0400 05/17/21 0459 05/17/21 0730 05/17/21 0747  BP: (!) 142/99  122/78 122/78  Pulse: 78   77  Resp: (!) 21   19  Temp: 98.3 F (36.8 C)   98.6 F (37 C)  TempSrc: Axillary   Oral  SpO2: 96%   96%  Weight:  85 kg    Height:        Intake/Output Summary (Last 24 hours) at 05/17/2021 1052 Last data filed at 05/17/2021 0600 Gross per 24 hour  Intake 1705.68 ml  Output 1400 ml  Net 305.68 ml   Last 3 Weights 05/17/2021 05/16/2021 05/15/2021  Weight (lbs) 187 lb 6.3 oz 192 lb 0.3 oz 190 lb 7.6 oz  Weight (kg) 85 kg 87.1 kg 86.4 kg      Telemetry    Episode of SVT this morning with rate 190 bpm, lasted 10 minutes, and since then normal sinus rhythm with rate 50s to 70s- Personally Reviewed  ECG    No new ECG - Personally Reviewed  Physical Exam   GEN: Confused, not following commands or answering questions Neck: No JVD Cardiac: RRR, 2/6 systolic murmur Respiratory: Clear to auscultation bilaterally in anterior fields GI: Soft, nontender, non-distended  MS: No edema Neuro:  Nonfocal  Psych: Normal affect   Labs    High Sensitivity Troponin:    Recent Labs  Lab 05/16/21 1639 05/16/21 1830  TROPONINIHS 24* 21*     Chemistry Recent Labs  Lab 05/13/21 1654 05/14/21 0630 05/15/21 1225 05/16/21 0155 05/16/21 1639 05/17/21 0726  NA 135 137   < > 133* 132* 130*  K 3.2* 3.7   < > 3.3* 3.6 4.0  CL 101 104   < > 98 95* 97*  CO2 18* 21*   < > 21* 26 23  GLUCOSE 132* 99   < > 68* 199* 108*  BUN 8 8   < > <5* <5* <5*  CREATININE 1.67* 1.13   < > 0.74 0.91 0.88  CALCIUM 8.8* 8.2*   < > 8.2* 8.4* 8.4*  MG  --   --   --  1.1* 1.9 1.8  PROT 6.3* 5.2*  --   --   --   --   ALBUMIN 2.9* 2.5*  --   --   --   --   AST 21 17  --   --   --   --   ALT <5 5  --   --   --   --   ALKPHOS 118 80  --   --   --   --   BILITOT 1.3* 1.1  --   --   --   --  GFRNONAA 43* >60   < > >60 >60 >60  ANIONGAP 16* 12   < > 14 11 10    < > = values in this interval not displayed.    Lipids No results for input(s): CHOL, TRIG, HDL, LABVLDL, LDLCALC, CHOLHDL in the last 168 hours.  Hematology Recent Labs  Lab 05/14/21 0630 05/15/21 1225 05/17/21 0726  WBC 9.4 9.7 10.7*  RBC 4.49 4.42 5.05  HGB 13.7 13.6 15.5  HCT 42.2 41.2 45.8  MCV 94.0 93.2 90.7  MCH 30.5 30.8 30.7  MCHC 32.5 33.0 33.8  RDW 14.9 14.4 14.3  PLT 202 196 235   Thyroid  Recent Labs  Lab 05/15/21 1227  TSH 1.957    BNPNo results for input(s): BNP, PROBNP in the last 168 hours.  DDimer  Recent Labs  Lab 05/13/21 2025  DDIMER 1.20*     Radiology    MR BRAIN WO CONTRAST  Result Date: 05/17/2021 CLINICAL DATA:  Initial evaluation for acute delirium. EXAM: MRI HEAD WITHOUT CONTRAST TECHNIQUE: Multiplanar, multiecho pulse sequences of the brain and surrounding structures were obtained without intravenous contrast. COMPARISON:  From prior CT from 04/12/2021. FINDINGS: Brain: Examination is severely limited as the patient was unable to tolerate the full length of the exam. Axial and coronal DWI weighted sequences, with axial FLAIR and sagittal T1 weighted sequences only were  performed. The provided images are severely degraded by motion artifact, particularly the FLAIR and T1 weighted sequence which are centrally nondiagnostic. Diffusion-weighted imaging demonstrates no definite evidence for acute or subacute infarct. Gray-white matter differentiation grossly maintained. Underlying atrophy noted. Associated ventriculomegaly grossly stable from prior. No visible mass effect or midline shift. No obvious mass lesion or extra-axial fluid collection. Vascular: Not well assessed on this limited exam. Skull and upper cervical spine: Not well assessed on this limited exam. Sinuses/Orbits: Not well assessed on this limited exam. Other: None. IMPRESSION: 1. Severely limited exam due to the patient's inability to tolerate the full length of the study and extensive motion artifact. No definite evidence for acute infarct. 2. The remainder of the examination is essentially nondiagnostic. Electronically Signed   By: Jeannine Boga M.D.   On: 05/17/2021 00:39   VAS [place] LOWER EXTREMITY VENOUS (DVT)  Result Date: 05/15/2021  Lower Venous DVT Study Patient Name:  Michael Hines  Date of Exam:   05/15/2021 Medical Rec #: [id number]          Accession #:    [id number] Date of Birth: 03-24-1946          Patient Gender: M Patient Age:   75 years Exam Location:  [facility] Procedure:      VAS [place] LOWER EXTREMITY VENOUS (DVT) Referring Phys: Jerald Kief REGALADO --------------------------------------------------------------------------------  Indications: Elevated Ddimer.  Risk Factors: None identified. Limitations: Poor ultrasound/tissue interface and patient positioning, patient movement, poor patient cooperation. Comparison Study: No prior studies. Performing Technologist: Oliver Hum RVT  Examination Guidelines: A complete evaluation includes B-mode imaging, spectral Doppler, color Doppler, and power Doppler as needed of all accessible portions of each vessel. Bilateral testing is considered  an integral part of a complete examination. Limited examinations for reoccurring indications may be performed as noted. The reflux portion of the exam is performed with the patient in reverse Trendelenburg.  +---------+---------------+---------+-----------+----------+--------------+ RIGHT    CompressibilityPhasicitySpontaneityPropertiesThrombus Aging +---------+---------------+---------+-----------+----------+--------------+ CFV      Full           Yes      Yes                                 +---------+---------------+---------+-----------+----------+--------------+  SFJ      Full                                                        +---------+---------------+---------+-----------+----------+--------------+ FV Prox  Full                                                        +---------+---------------+---------+-----------+----------+--------------+ FV Mid   Full                                                        +---------+---------------+---------+-----------+----------+--------------+ FV DistalFull                                                        +---------+---------------+---------+-----------+----------+--------------+ PFV      Full                                                        +---------+---------------+---------+-----------+----------+--------------+ POP      Full           Yes      Yes                                 +---------+---------------+---------+-----------+----------+--------------+ PTV      Full                                                        +---------+---------------+---------+-----------+----------+--------------+ PERO     Full                                                        +---------+---------------+---------+-----------+----------+--------------+   +---------+---------------+---------+-----------+----------+-------------------+ LEFT      CompressibilityPhasicitySpontaneityPropertiesThrombus Aging      +---------+---------------+---------+-----------+----------+-------------------+ CFV      Full           Yes      Yes                                      +---------+---------------+---------+-----------+----------+-------------------+ SFJ      Full                                                             +---------+---------------+---------+-----------+----------+-------------------+  FV Prox  Full                                                             +---------+---------------+---------+-----------+----------+-------------------+ FV Mid   Full                                                             +---------+---------------+---------+-----------+----------+-------------------+ FV DistalFull                                                             +---------+---------------+---------+-----------+----------+-------------------+ PFV      Full                                                             +---------+---------------+---------+-----------+----------+-------------------+ POP      Full           Yes      Yes                                      +---------+---------------+---------+-----------+----------+-------------------+ PTV      Full                                                             +---------+---------------+---------+-----------+----------+-------------------+ PERO                                                  Not well visualized +---------+---------------+---------+-----------+----------+-------------------+     Summary: RIGHT: - There is no evidence of deep vein thrombosis in the lower extremity. However, portions of this examination were limited- see technologist comments above.  - No cystic structure found in the popliteal fossa.  LEFT: - There is no evidence of deep vein thrombosis in the lower extremity. However, portions of this  examination were limited- see technologist comments above.  - No cystic structure found in the popliteal fossa.  *See table(s) above for measurements and observations. Electronically signed by Harold Barban MD on 05/15/2021 at 4:52:01 PM.    Final     Cardiac Studies     Patient Profile     75 y.o. male with a hx of COPD, T2DM, hyperlipidemia, bioprosthetic aortic valve, recent MSSA bacteremia who is being seen  for the evaluation of tachycardia    Assessment & Plan    SVT: episodes regular  narrow complex tachycardia lasting up to 15 minutes, rate 190.  Consistent with SVT.  -Started PO metoprolol 25 mg twice daily but he remains confused and is refusing any oral medications.  We will schedule IV metoprolol 2.5 mg every 6 hours, can transition back to p.o. once able -Maintain K>4, Mag>2   Status post AVR:  recent MSSA bacteremia.  TEE on 04/18/2021 showed 21 mm bioprosthetic aortic valve (V-max 2.6 m/s, mean gradient 15 mmHg, EOA 1.7 cm, DI 0.54), mild central AI, no definitive evidence of endocarditis, normal biventricular function, mild MR, ascending aortic aneurysm measuring 45 mm. - Will check limited echo as recent bacteremia and h/o AVR, no definitive evidence of endocarditis on TEE last month but close echo follow-up recommended  For questions or updates, please contact [place] Please consult [email or web address] for contact info under        Signed, Donato Heinz, MD  05/17/2021, 10:52 AM

## 2021-05-17 NOTE — TOC Progression Note (Signed)
Transition of Care [facility]) - Progression Note    Patient Details  Name: Michael Hines MRN: [id number] Date of Birth: [birth year]  [facility] CM/SW [place], [place] Phone Number: 05/17/2021, 12:18 PM  Clinical Narrative:    CSW spoke with pt's spouse to discuss discharge plans and recommendations for SNF. Pt has been at [facility] and was supposed to DC Tuesday, but ended up admitted to the hospital. CSW and spouse discussed that pt is into his copay days, and that the PT rec is for him to go back. CSW noted that pt was listed as having limited needs and a click score of 24, meaning pt may not be approved to go back to SNF. CSW discussed his current needs and wife would like to hold off on deciding between SNF and HH to see if pt improves any over the weekend. TOC will follow back up with wife to confirm DC plan after PT sees again. TOC will continue to follow.        Expected Discharge Plan and Services                                                 Social Determinants of Health (SDOH) Interventions    Readmission Risk Interventions No flowsheet data found.

## 2021-05-17 NOTE — Progress Notes (Signed)
ABG obtained on patient and sent down to lab. Patient does not know where he is or what his name is.

## 2021-05-17 NOTE — Procedures (Signed)
Patient Name: Michael Hines  MRN: [id number]  Epilepsy Attending: Lora Havens  Referring Physician/Provider: Dr Niel Hummer Date: 05/17/2021 Duration: 24.26 mins  Patient history: 75 year old male with altered mental status.  EEG to evaluate for seizures.  Level of alertness: Awake, asleep  AEDs during EEG study: None  Technical aspects: This EEG study was done with scalp electrodes positioned according to the 10-20 International system of electrode placement. Electrical activity was acquired at a sampling rate of 500Hz  and reviewed with a high frequency filter of 70Hz  and a low frequency filter of 1Hz . EEG data were recorded continuously and digitally stored.   Description: No clear posterior dominant rhythm was seen.  Sleep was characterized by sleep spindles (12 to 14 Hz), maximal frontocentral region.  EEG showed continuous generalized predominantly 5 to 7 Hz theta as well as intermittent generalized 2 to 3 Hz delta slowing.  Hyperventilation and photic stimulation were not performed.     Patient was noted to "shaking on his right side" per EEG annotation at 1038 which was difficult to be on camera.  Concomitant EEG before, during and after the event did not show any EEG change to suggest seizure.  ABNORMALITY - Continuous slow, generalized  IMPRESSION: This study is suggestive of moderate diffuse encephalopathy, nonspecific to etiology.  No seizures or epileptiform discharges were seen throughout the recording.  Patient was noted to "shaking on his right side" at 1038 without concomitant EEG change. This was most likely not an epileptic event.  Cherice Glennie Barbra Sarks

## 2021-05-17 NOTE — Progress Notes (Addendum)
PROGRESS NOTE    Michael Hines  OXB:[id number] DOB: 1945-08-19 DOA: 05/13/2021 PCP: Finis Bud, MD   Brief Narrative: 75 year old with past medical history significant for COPD, type 2 diabetes, hyperlipidemia, bioprosthetic aortic valve replacement and recent MSSA septicemia status post 4 weeks of IV Ancef which he will have finished 10/3 presented to the ID clinic for follow-up and found to be more confused than normal.  He is was diaphoretic and heart rate was in the 130s, temperature was 92.  They were not able to check his blood pressure.  He was transferred to the ED for further evaluation.  Patient was at a skilled nursing facility finishing up his IV antibiotic therapy.    Assessment & Plan:   Principal Problem:   Sepsis with acute organ dysfunction (HCC) Active Problems:   Diabetes mellitus without complication ([place])   S/P AVR   Diabetic polyneuropathy associated with type 2 diabetes mellitus (HCC)   Chronic bilateral low back pain without sciatica   Hypotension   Hypothermia   AKI (acute kidney injury) ([place])   Sepsis with acute organ dysfunction without septic shock ([place])  SIRS;  Patient presents with confusion, hypothermia, hypotension. Not Clear source of infection. Sepsis was ruled out.  Blood cultures no growth today.  Chest x-ray no evidence for infection. Urine culture no growth to date.  ID was consulted due to recent MSSA bacteremia.  Patient was a started on cefepime and vancomycin.  Subsequently vancomycin was discontinued.  Cefepime was discontinued 10/5.   Acute metabolic encephalopathy: Delirium, in the setting of recent infection, dehydration. Question Cefepime .  B12 984, thiamine level pending, started IV thiamine. Ammonia level 21 normal, B12 984 normal.  TSH 1.9 normal EEG;  MRI negative for acute stroke.  ABG hypoxemia; will place on oxygen.  Neurology consulted.   Hypoxemia;  Will check chest x ray if negative will proceed with CTA  rule out PE>  Will place on 3 L oxygen.   Hyponatremia , hypokalemia: Continue with IV fluids replete potassium. Improved.   SVT:  Patient had HR up 190-200.  He denies chest pain  Appreciate cardiology eval.  Plan for IV metoprolol, patient not taking oral meds.  ECHO pending.   Nutrition;  Might need core-track for tube feeding. Family considering.   Type 2 diabetes: Continue with a sliding scale insulin.   Parkinsonism:  Vascular parkinsonism; I discussed with Dr Martie Round, patient's primary neurologist about holding Carbidopa-levodopa. He said we can hold for now, and he can reassess out patient needs for this medications.   Radiculopathy: Plan to hold gabapentin due to AMS.    Elevated D-dimer: Doppler lower extremity negative for DVT.    Estimated body mass index is 29.35 kg/m as calculated from the following:   Height as of this encounter: 5\' 7"  (1.702 m).   Weight as of this encounter: 85 kg.   DVT prophylaxis: Lovenox Code Status: Full code Family Communication: Wife who was at bedside Disposition Plan:  Status is: Inpatient  Remains inpatient appropriate because:IV treatments appropriate due to intensity of illness or inability to take PO  Dispo: The patient is from: SNF              Anticipated d/c is to: SNF              Patient currently is not medically stable to d/c.   Difficult to place patient No        Consultants:  ID  Procedures:  Antimicrobials:    Subjective: Keep eyes close, denies pain. Wants to be left alone.    Objective: Vitals:   05/17/21 0400 05/17/21 0459 05/17/21 0730 05/17/21 0747  BP: (!) 142/99  122/78 122/78  Pulse: 78   77  Resp: (!) 21   19  Temp: 98.3 F (36.8 C)   98.6 F (37 C)  TempSrc: Axillary   Oral  SpO2: 96%   96%  Weight:  85 kg    Height:        Intake/Output Summary (Last 24 hours) at 05/17/2021 1120 Last data filed at 05/17/2021 0600 Gross per 24 hour  Intake 1705.68 ml  Output 1400  ml  Net 305.68 ml    Filed Weights   05/15/21 1317 05/16/21 0339 05/17/21 0459  Weight: 86.4 kg 87.1 kg 85 kg    Examination:  General exam: Chronic ill appearing.  Respiratory system: CTA Cardiovascular system: S 1, S 2 RRR Gastrointestinal system: BS Present, soft, nt Central nervous system: keep eyes close, does not follows command, extremities with rigidity. Say few words.  Extremities: no edema   Data Reviewed: I have personally reviewed following labs and imaging studies  CBC: Recent Labs  Lab 05/13/21 1654 05/14/21 0630 05/15/21 1225 05/17/21 0726  WBC 11.3* 9.4 9.7 10.7*  NEUTROABS 9.4* 6.4  --   --   HGB 16.3 13.7 13.6 15.5  HCT 51.0 42.2 41.2 45.8  MCV 95.9 94.0 93.2 90.7  PLT 244 202 196 818    Basic Metabolic Panel: Recent Labs  Lab 05/14/21 0630 05/15/21 1225 05/16/21 0155 05/16/21 1639 05/17/21 0726  NA 137 133* 133* 132* 130*  K 3.7 3.0* 3.3* 3.6 4.0  CL 104 99 98 95* 97*  CO2 21* 23 21* 26 23  GLUCOSE 99 95 68* 199* 108*  BUN 8 <5* <5* <5* <5*  CREATININE 1.13 0.82 0.74 0.91 0.88  CALCIUM 8.2* 8.1* 8.2* 8.4* 8.4*  MG  --   --  1.1* 1.9 1.8    GFR: Estimated Creatinine Clearance: 77.9 mL/min (by C-G formula based on SCr of 0.88 mg/dL). Liver Function Tests: Recent Labs  Lab 05/13/21 1654 05/14/21 0630  AST 21 17  ALT <5 5  ALKPHOS 118 80  BILITOT 1.3* 1.1  PROT 6.3* 5.2*  ALBUMIN 2.9* 2.5*    No results for input(s): LIPASE, AMYLASE in the last 168 hours. Recent Labs  Lab 05/15/21 1315  AMMONIA 21    Coagulation Profile: No results for input(s): INR, PROTIME in the last 168 hours. Cardiac Enzymes: No results for input(s): CKTOTAL, CKMB, CKMBINDEX, TROPONINI in the last 168 hours. BNP (last 3 results) No results for input(s): PROBNP in the last 8760 hours. HbA1C: No results for input(s): HGBA1C in the last 72 hours. CBG: Recent Labs  Lab 05/16/21 1247 05/16/21 1625 05/16/21 1711 05/16/21 2117 05/17/21 0755   GLUCAP 86 166* 227* 82 119*    Lipid Profile: No results for input(s): CHOL, HDL, LDLCALC, TRIG, CHOLHDL, LDLDIRECT in the last 72 hours. Thyroid Function Tests: Recent Labs    05/15/21 1227  TSH 1.957    Anemia Panel: Recent Labs    05/15/21 1225  VITAMINB12 984*    Sepsis Labs: Recent Labs  Lab 05/13/21 1713 05/13/21 2025 05/14/21 0000 05/14/21 0630 05/15/21 1228  PROCALCITON  --   --  0.11 0.21 <0.10  LATICACIDVEN 2.0* 1.4  --   --   --      Recent Results (from the past 240 hour(s))  Blood Culture (routine x 2)     Status: None (Preliminary result)   Collection Time: 05/13/21  5:13 PM   Specimen: BLOOD  Result Value Ref Range Status   Specimen Description BLOOD LEFT ANTECUBITAL  Final   Special Requests   Final    BOTTLES DRAWN AEROBIC AND ANAEROBIC Blood Culture results may not be optimal due to an inadequate volume of blood received in culture bottles   Culture   Final    NO GROWTH 3 DAYS Performed at [facility], [place] [address] [place], [place] [address]    Report Status PENDING  Incomplete  Resp Panel by RT-PCR (Flu A&B, Covid) Nasopharyngeal Swab     Status: None   Collection Time: 05/13/21  5:22 PM   Specimen: Nasopharyngeal Swab; Nasopharyngeal(NP) swabs in vial transport medium  Result Value Ref Range Status   SARS Coronavirus 2 by RT PCR NEGATIVE NEGATIVE Final    Comment: (NOTE) SARS-CoV-2 target nucleic acids are NOT DETECTED.  The SARS-CoV-2 RNA is generally detectable in upper respiratory specimens during the acute phase of infection. The lowest concentration of SARS-CoV-2 viral copies this assay can detect is 138 copies/mL. A negative result does not preclude SARS-Cov-2 infection and should not be used as the sole basis for treatment or other patient management decisions. A negative result may occur with  improper specimen collection/handling, submission of specimen other than nasopharyngeal swab, presence of viral mutation(s)  within the areas targeted by this assay, and inadequate number of viral copies(<138 copies/mL). A negative result must be combined with clinical observations, patient history, and epidemiological information. The expected result is Negative.  Fact Sheet for Patients:  EntrepreneurPulse.com.au  Fact Sheet for Healthcare Providers:  IncredibleEmployment.be  This test is no t yet approved or cleared by the [place] FDA and  has been authorized for detection and/or diagnosis of SARS-CoV-2 by FDA under an Emergency Use Authorization (EUA). This EUA will remain  in effect (meaning this test can be used) for the duration of the COVID-19 declaration under Section 564(b)(1) of the Act, 21 U.S.C.section 360bbb-3(b)(1), unless the authorization is terminated  or revoked sooner.       Influenza A by PCR NEGATIVE NEGATIVE Final   Influenza B by PCR NEGATIVE NEGATIVE Final    Comment: (NOTE) The Xpert Xpress SARS-CoV-2/FLU/RSV plus assay is intended as an aid in the diagnosis of influenza from Nasopharyngeal swab specimens and should not be used as a sole basis for treatment. Nasal washings and aspirates are unacceptable for Xpert Xpress SARS-CoV-2/FLU/RSV testing.  Fact Sheet for Patients: EntrepreneurPulse.com.au  Fact Sheet for Healthcare Providers: IncredibleEmployment.be  This test is not yet approved or cleared by the [place] FDA and has been authorized for detection and/or diagnosis of SARS-CoV-2 by FDA under an Emergency Use Authorization (EUA). This EUA will remain in effect (meaning this test can be used) for the duration of the COVID-19 declaration under Section 564(b)(1) of the Act, 21 U.S.C. section 360bbb-3(b)(1), unless the authorization is terminated or revoked.  Performed at [facility], [place] [address], [place], [place] [address]   Blood Culture (routine x 2)     Status: None  (Preliminary result)   Collection Time: 05/13/21  5:39 PM   Specimen: BLOOD  Result Value Ref Range Status   Specimen Description BLOOD RIGHT ANTECUBITAL  Final   Special Requests   Final    BOTTLES DRAWN AEROBIC AND ANAEROBIC Blood Culture results may not be optimal due to an  inadequate volume of blood received in culture bottles   Culture   Final    NO GROWTH 3 DAYS Performed at [facility], [place] [address], [place], [place] [address]    Report Status PENDING  Incomplete  Urine Culture     Status: None   Collection Time: 05/15/21  1:16 PM   Specimen: Urine, Clean Catch  Result Value Ref Range Status   Specimen Description URINE, CLEAN CATCH  Final   Special Requests NONE  Final   Culture   Final    NO GROWTH Performed at [facility], [place] [address], [place], [place] [address]    Report Status 05/16/2021 FINAL  Final  MRSA Next Gen by PCR, Nasal     Status: None   Collection Time: 05/15/21  3:54 PM   Specimen: Nasal Mucosa; Nasal Swab  Result Value Ref Range Status   MRSA by PCR Next Gen NOT DETECTED NOT DETECTED Final    Comment: (NOTE) The GeneXpert MRSA Assay (FDA approved for NASAL specimens only), is one component of a comprehensive MRSA colonization surveillance program. It is not intended to diagnose MRSA infection nor to guide or monitor treatment for MRSA infections. Test performance is not FDA approved in patients less than 4 years old. Performed at [facility], [place] [address] [place], [place] [address]           Radiology Studies: MR BRAIN WO CONTRAST  Result Date: 05/17/2021 CLINICAL DATA:  Initial evaluation for acute delirium. EXAM: MRI HEAD WITHOUT CONTRAST TECHNIQUE: Multiplanar, multiecho pulse sequences of the brain and surrounding structures were obtained without intravenous contrast. COMPARISON:  From prior CT from 04/12/2021. FINDINGS: Brain: Examination is severely limited as the patient was unable to tolerate the full length  of the exam. Axial and coronal DWI weighted sequences, with axial FLAIR and sagittal T1 weighted sequences only were performed. The provided images are severely degraded by motion artifact, particularly the FLAIR and T1 weighted sequence which are centrally nondiagnostic. Diffusion-weighted imaging demonstrates no definite evidence for acute or subacute infarct. Gray-white matter differentiation grossly maintained. Underlying atrophy noted. Associated ventriculomegaly grossly stable from prior. No visible mass effect or midline shift. No obvious mass lesion or extra-axial fluid collection. Vascular: Not well assessed on this limited exam. Skull and upper cervical spine: Not well assessed on this limited exam. Sinuses/Orbits: Not well assessed on this limited exam. Other: None. IMPRESSION: 1. Severely limited exam due to the patient's inability to tolerate the full length of the study and extensive motion artifact. No definite evidence for acute infarct. 2. The remainder of the examination is essentially nondiagnostic. Electronically Signed   By: Jeannine Boga M.D.   On: 05/17/2021 00:39   VAS [place] LOWER EXTREMITY VENOUS (DVT)  Result Date: 05/15/2021  Lower Venous DVT Study Patient Name:  Michael Hines  Date of Exam:   05/15/2021 Medical Rec #: [id number]          Accession #:    [id number] Date of Birth: [birth year]          Patient Gender: M Patient Age:   79 years Exam Location:  [facility] Procedure:      VAS [place] LOWER EXTREMITY VENOUS (DVT) Referring Phys: Jerald Kief Tylisa Alcivar --------------------------------------------------------------------------------  Indications: Elevated Ddimer.  Risk Factors: None identified. Limitations: Poor ultrasound/tissue interface and patient positioning, patient movement, poor patient cooperation. Comparison Study: No prior studies. Performing Technologist: Oliver Hum RVT  Examination Guidelines: A complete evaluation includes  B-mode imaging, spectral Doppler,  color Doppler, and power Doppler as needed of all accessible portions of each vessel. Bilateral testing is considered an integral part of a complete examination. Limited examinations for reoccurring indications may be performed as noted. The reflux portion of the exam is performed with the patient in reverse Trendelenburg.  +---------+---------------+---------+-----------+----------+--------------+ RIGHT    CompressibilityPhasicitySpontaneityPropertiesThrombus Aging +---------+---------------+---------+-----------+----------+--------------+ CFV      Full           Yes      Yes                                 +---------+---------------+---------+-----------+----------+--------------+ SFJ      Full                                                        +---------+---------------+---------+-----------+----------+--------------+ FV Prox  Full                                                        +---------+---------------+---------+-----------+----------+--------------+ FV Mid   Full                                                        +---------+---------------+---------+-----------+----------+--------------+ FV DistalFull                                                        +---------+---------------+---------+-----------+----------+--------------+ PFV      Full                                                        +---------+---------------+---------+-----------+----------+--------------+ POP      Full           Yes      Yes                                 +---------+---------------+---------+-----------+----------+--------------+ PTV      Full                                                        +---------+---------------+---------+-----------+----------+--------------+ PERO     Full                                                        +---------+---------------+---------+-----------+----------+--------------+    +---------+---------------+---------+-----------+----------+-------------------+  LEFT     CompressibilityPhasicitySpontaneityPropertiesThrombus Aging      +---------+---------------+---------+-----------+----------+-------------------+ CFV      Full           Yes      Yes                                      +---------+---------------+---------+-----------+----------+-------------------+ SFJ      Full                                                             +---------+---------------+---------+-----------+----------+-------------------+ FV Prox  Full                                                             +---------+---------------+---------+-----------+----------+-------------------+ FV Mid   Full                                                             +---------+---------------+---------+-----------+----------+-------------------+ FV DistalFull                                                             +---------+---------------+---------+-----------+----------+-------------------+ PFV      Full                                                             +---------+---------------+---------+-----------+----------+-------------------+ POP      Full           Yes      Yes                                      +---------+---------------+---------+-----------+----------+-------------------+ PTV      Full                                                             +---------+---------------+---------+-----------+----------+-------------------+ PERO                                                  Not well visualized +---------+---------------+---------+-----------+----------+-------------------+     Summary: RIGHT: - There is no evidence  of deep vein thrombosis in the lower extremity. However, portions of this examination were limited- see technologist comments above.  - No cystic structure found in the popliteal fossa.  LEFT: - There is  no evidence of deep vein thrombosis in the lower extremity. However, portions of this examination were limited- see technologist comments above.  - No cystic structure found in the popliteal fossa.  *See table(s) above for measurements and observations. Electronically signed by Harold Barban MD on 05/15/2021 at 4:52:01 PM.    Final         Scheduled Meds:  clotrimazole   Topical BID   enoxaparin (LOVENOX) injection  40 mg Subcutaneous Q24H   fluticasone furoate-vilanterol  1 puff Inhalation Daily   insulin aspart  0-15 Units Subcutaneous TID WC   insulin aspart  0-5 Units Subcutaneous QHS   metoprolol tartrate  2.5 mg Intravenous Q6H   Continuous Infusions:  dextrose 5 % and 0.9% NaCl     magnesium sulfate bolus IVPB     thiamine injection 500 mg (05/16/21 1738)     LOS: 4 days    Time spent: 35 minutes    Adysson Revelle A Deago Burruss, MD [facility]   If 7PM-7AM, please contact night-coverage [email or web address]  05/17/2021, 11:20 AM

## 2021-05-17 NOTE — Evaluation (Signed)
Clinical/Bedside Swallow Evaluation Patient Details  Name: Michael Hines MRN: [id number] Date of Birth: 1946/01/22  Today's Date: 05/17/2021 Time: SLP Start Time (ACUTE ONLY): 5852 SLP Stop Time (ACUTE ONLY): 7782 SLP Time Calculation (min) (ACUTE ONLY): 23 min  Past Medical History:  Past Medical History:  Diagnosis Date   Anxiety    B12 deficiency    Back pain    Diabetes mellitus without complication ([place])    Hyperlipidemia    Hypotension 05/13/2021   Hypothermia 05/13/2021   Severe sepsis with septic shock ([place]) 05/13/2021   Tachycardia 05/13/2021   Tachypnea 05/13/2021   Testosterone insufficiency    Past Surgical History:  Past Surgical History:  Procedure Laterality Date   AORTIC VALVE REPLACEMENT  2013   redo bioprosthetic valve at [facility], Dr Mauricio Po   CHOLECYSTECTOMY     STRABISMUS SURGERY     TEE WITHOUT CARDIOVERSION N/A 09/07/2018   Procedure: TRANSESOPHAGEAL ECHOCARDIOGRAM (TEE);  Surgeon: Acie Fredrickson Wonda Cheng, MD;  Location: [facility]COPY;  Service: Cardiovascular;  Laterality: N/A;   TEE WITHOUT CARDIOVERSION N/A 04/18/2021   Procedure: TRANSESOPHAGEAL ECHOCARDIOGRAM (TEE);  Surgeon: Geralynn Rile, MD;  Location: [facility];  Service: Cardiovascular;  Laterality: N/A;   HPI:  Pt is a 75 y.o. male presents the ED on 05/13/2021 from ID clinic due to hypothermia and hypotension. CXR on admission negative for focal airspace disease. MRI brain 10/6 negative for acute changes. EEG 10/7: Pt with recent hospitalization in September for MSSA septicemia, was discharged to SNF. PMH:  COPD, type 2 diabetes, hyperlipidemia, bioprosthetic aortic valve replacement. Pt with recent hospitalization in September for MSSA septicemia, was discharged to SNF.    Assessment / Plan / Recommendation  Clinical Impression  Pt was seen for bedside swallow evaluation with his wife present. Pt's wife denied the pt having any symptoms of oropharyngeal dysphagia until recently at the SNF when  oral holding and poor p.o. intake were reported to her. She expressed that the pt's intake continues to be limited, but she denied any signs of aspiration with p.o. intake. A complete oral mechanism exam could not be conducted due to pt's difficulty following commands and pt closed his mouth tightly when oral inspection was attempted. Trials were limited to puree and thin liquids via straw due to pt's refusal. Mild oral holding was noted, but no s/sx of aspiration demonstrated. Bolus sizes were small, but no significant oral residue was noted during limited oral viewing. Pt's presentation is likely related to his current mentation. Pt's current diet will be continued as SLP does not anticipate that a modified diet would promote increased intake. SLP will follow pt for further assessment and for intervention as clinically indicated. SLP Visit Diagnosis: Dysphagia, unspecified (R13.10)    Aspiration Risk  Mild aspiration risk    Diet Recommendation Regular;Thin liquid (Continue current diet)   Liquid Administration via: Cup;Straw Medication Administration: Crushed with puree Supervision: Staff to assist with self feeding Compensations: Slow rate Postural Changes: Seated upright at 90 degrees    Other  Recommendations Oral Care Recommendations: Oral care BID    Recommendations for follow up therapy are one component of a multi-disciplinary discharge planning process, led by the attending physician.  Recommendations may be updated based on patient status, additional functional criteria and insurance authorization.  Follow up Recommendations  (TBD)      Frequency and Duration min 2x/week  2 weeks       Prognosis Prognosis for Safe Diet Advancement: Fair Barriers to Reach Goals: Cognitive  deficits      Swallow Study   General Date of Onset: 05/16/21 HPI: Pt is a 75 y.o. male presents the ED on 05/13/2021 from ID clinic due to hypothermia and hypotension. CXR on admission negative for focal  airspace disease. MRI brain 10/6 negative for acute changes. EEG 10/7: Pt with recent hospitalization in September for MSSA septicemia, was discharged to SNF. PMH:  COPD, type 2 diabetes, hyperlipidemia, bioprosthetic aortic valve replacement. Pt with recent hospitalization in September for MSSA septicemia, was discharged to SNF. Type of Study: Bedside Swallow Evaluation Previous Swallow Assessment: none Diet Prior to this Study: Regular;Thin liquids Temperature Spikes Noted: No Respiratory Status: Nasal cannula History of Recent Intubation: No Behavior/Cognition: Alert;Cooperative;Confused;Doesn't follow directions;Requires cueing Oral Cavity Assessment: Within Functional Limits Oral Care Completed by SLP: No Oral Cavity - Dentition: Missing dentition Self-Feeding Abilities: Total assist Patient Positioning: Upright in bed;Postural control adequate for testing Baseline Vocal Quality: Normal Volitional Cough: Cognitively unable to elicit Volitional Swallow: Unable to elicit    Oral/Motor/Sensory Function Overall Oral Motor/Sensory Function:  (UTA)   Ice Chips Ice chips: Impaired (pt resistant and demonstrating spitting behaviors once bolus touched his lips) Presentation: Spoon   Thin Liquid Thin Liquid: Impaired Presentation: Straw Oral Phase Impairments: Poor awareness of bolus Oral Phase Functional Implications: Oral holding, pt spat out boluses presented via cup   Nectar Thick Nectar Thick Liquid: Not tested   Honey Thick Honey Thick Liquid: Not tested   Puree Puree: Impaired Presentation: Spoon Oral Phase Impairments: Poor awareness of bolus Oral Phase Functional Implications: Oral holding, initial bolus was spat out by pt.  Solid     Solid:  (Pt refused)     Nancey Kreitz I. Hardin Negus, [place], [place] Office number [phone] Pager [place] 05/17/2021,4:16 PM

## 2021-05-17 NOTE — Progress Notes (Signed)
Occupational Therapy Treatment Patient Details Name: Michael Hines MRN: [id number] DOB: [birth year] Today's Date: 05/17/2021   History of present illness 75 y.o. male presents to [facility] hospital on 05/13/2021 from ID clinic due to hypothermia and hypotension. Pt with recent hospitalization in September for MSSA septicemia, was discharged to SNF. Blood cultures negative this admission. PMH includes COPD, type 2 diabetes, hyperlipidemia, bioprosthetic aortic valve replacement.   OT comments  Pt seen at bed level, wife present. Pt limited in progress towards acute OT goals this session d/t ongoing acute metabolic encephalopathy. Presented familiar ADL tasks (oral care item, wash cloth, straw, etc.) pt unable to engage in functional/familiar tasks this session d/t cognition and fatigue (eyes closed throughout session). Pt not able to follow one step verbal commands but with tactile and verbal commands pt did not resist simple oral hygiene task. Unable to throughly complete oral care d/t increased gag reflex and increased agitation. Able to give brief, terse verbal responses at times especially when declining something (ex attempts at eating/drinking tasks). D/c plan remains appropriate.    Recommendations for follow up therapy are one component of a multi-disciplinary discharge planning process, led by the attending physician.  Recommendations may be updated based on patient status, additional functional criteria and insurance authorization.    Follow Up Recommendations  SNF    Equipment Recommendations  None recommended by OT    Recommendations for Other Services      Precautions / Restrictions Precautions Precautions: Fall Precaution Comments: Pt confused and agitated       Mobility Bed Mobility               General bed mobility comments: pt would not attempt    Transfers                 General transfer comment: unable to attempt    Balance                                            ADL either performed or assessed with clinical judgement   ADL Overall ADL's : Needs assistance/impaired Eating/Feeding: Total assistance;Bed level   Grooming: Wash/dry hands;Wash/dry face;Oral care;Bed level;Total assistance                                 General ADL Comments: pt resistive to  activity     Vision   Additional Comments: pt unable to participate   Perception     Praxis      Cognition Arousal/Alertness: Lethargic Behavior During Therapy: Restless Overall Cognitive Status: Difficult to assess Area of Impairment: Orientation;Attention;Following commands                 Orientation Level: Disoriented to;Place;Time;Situation;Person Current Attention Level: Focused   Following Commands: Follows one step commands inconsistently       General Comments: Pt not able to follow one step verbal commands, with tactile and verbal command pt did not resist simple oral hygiene task but unable to throughly complete d/t increased gag reflex and increased agitation. Able to give brief, terse verbal responses at times especially when declining something (ex attempts at eating/drinking tasks)        Exercises     Shoulder Instructions       General Comments Wife present. discussed strategies for adapting current environment  to cognitive needs such as blinds open in the day, door closed for quiet. also discussed bringing in familiar blanket/item from home.    Pertinent Vitals/ Pain       Pain Assessment: Faces Faces Pain Scale: Hurts a little bit Pain Location: unspecified Pain Descriptors / Indicators: Grimacing Pain Intervention(s): Monitored during session;Limited activity within patient's tolerance  Home Living                                          Prior Functioning/Environment              Frequency  Min 2X/week        Progress Toward Goals  OT Goals(current goals can now  be found in the care plan section)  Progress towards OT goals: Not progressing toward goals - comment  Acute Rehab OT Goals Patient Stated Goal: wife wants pt to get well and be able to go home after rehab OT Goal Formulation: With family Time For Goal Achievement: 05/29/21 Potential to Achieve Goals: Fair ADL Goals Pt Will Perform Eating: with min assist;with adaptive utensils;sitting Pt Will Perform Grooming: with mod assist;sitting Pt Will Perform Upper Body Bathing: with mod assist;sitting Additional ADL Goal #1: Pt will actively participate in 10 mins therapeutic activity with min cues  Plan Discharge plan remains appropriate    Co-evaluation                 AM-PAC OT "6 Clicks" Daily Activity     Outcome Measure   Help from another person eating meals?: Total Help from another person taking care of personal grooming?: Total Help from another person toileting, which includes using toliet, bedpan, or urinal?: Total Help from another person bathing (including washing, rinsing, drying)?: Total Help from another person to put on and taking off regular upper body clothing?: Total Help from another person to put on and taking off regular lower body clothing?: Total 6 Click Score: 6    End of Session    OT Visit Diagnosis: Cognitive communication deficit (R41.841)   Activity Tolerance Patient limited by lethargy;Patient limited by fatigue;Other (comment) (cognition/AMS)   Patient Left in bed;with family/visitor present;with bed alarm set   Nurse Communication          Time: [phone] OT Time Calculation (min): 20 min  Charges: OT General Charges $OT Visit: 1 Visit OT Treatments $Self Care/Home Management : 8-22 mins  Tyrone Schimke, OT Acute Rehabilitation Services Pager: [phone] Office: [phone]   Hortencia Pilar 05/17/2021, 12:20 PM

## 2021-05-17 NOTE — Progress Notes (Signed)
  Echocardiogram 2D Echocardiogram has been performed.  Michael Hines 05/17/2021, 3:36 PM

## 2021-05-17 NOTE — Care Management Important Message (Signed)
Important Message  Patient Details  Name: Michael Hines MRN: [id number] Date of Birth: January 28, 1946   Medicare Important Message Given:  Yes     Orbie Pyo 05/17/2021, 3:55 PM

## 2021-05-18 ENCOUNTER — Inpatient Hospital Stay (HOSPITAL_COMMUNITY): Payer: Medicare Other

## 2021-05-18 DIAGNOSIS — I471 Supraventricular tachycardia: Secondary | ICD-10-CM | POA: Diagnosis not present

## 2021-05-18 DIAGNOSIS — G9341 Metabolic encephalopathy: Secondary | ICD-10-CM | POA: Diagnosis not present

## 2021-05-18 DIAGNOSIS — Z952 Presence of prosthetic heart valve: Secondary | ICD-10-CM | POA: Diagnosis not present

## 2021-05-18 DIAGNOSIS — A419 Sepsis, unspecified organism: Secondary | ICD-10-CM | POA: Diagnosis not present

## 2021-05-18 DIAGNOSIS — R6520 Severe sepsis without septic shock: Secondary | ICD-10-CM | POA: Diagnosis not present

## 2021-05-18 DIAGNOSIS — N179 Acute kidney failure, unspecified: Secondary | ICD-10-CM | POA: Diagnosis not present

## 2021-05-18 LAB — CULTURE, BLOOD (ROUTINE X 2)
Culture: NO GROWTH
Culture: NO GROWTH

## 2021-05-18 LAB — GLUCOSE, CAPILLARY
Glucose-Capillary: 109 mg/dL — ABNORMAL HIGH (ref 70–99)
Glucose-Capillary: 121 mg/dL — ABNORMAL HIGH (ref 70–99)
Glucose-Capillary: 84 mg/dL (ref 70–99)
Glucose-Capillary: 98 mg/dL (ref 70–99)

## 2021-05-18 LAB — BASIC METABOLIC PANEL
Anion gap: 9 (ref 5–15)
BUN: <5 mg/dL — ABNORMAL LOW (ref 8–23)
CO2: 25 mmol/L (ref 22–32)
Calcium: 8.4 mg/dL — ABNORMAL LOW (ref 8.9–10.3)
Chloride: 97 mmol/L — ABNORMAL LOW (ref 98–111)
Creatinine, Ser: 0.83 mg/dL (ref 0.61–1.24)
GFR, Estimated: >60 mL/min (ref >60)
Glucose, Bld: 112 mg/dL — ABNORMAL HIGH (ref 70–99)
Potassium: 3.3 mmol/L — ABNORMAL LOW (ref 3.5–5.1)
Sodium: 131 mmol/L — ABNORMAL LOW (ref 135–145)

## 2021-05-18 LAB — LIPID PANEL
Cholesterol: 201 mg/dL — ABNORMAL HIGH (ref 0–200)
HDL: 32 mg/dL — ABNORMAL LOW (ref >40)
LDL Cholesterol: 143 mg/dL — ABNORMAL HIGH (ref 0–99)
Total CHOL/HDL Ratio: 6.3 RATIO
Triglycerides: 131 mg/dL (ref <150)
VLDL: 26 mg/dL (ref 0–40)

## 2021-05-18 LAB — CBC
HCT: 43.5 % (ref 39.0–52.0)
Hemoglobin: 14.4 g/dL (ref 13.0–17.0)
MCH: 30.3 pg (ref 26.0–34.0)
MCHC: 33.1 g/dL (ref 30.0–36.0)
MCV: 91.4 fL (ref 80.0–100.0)
Platelets: 217 10*3/uL (ref 150–400)
RBC: 4.76 MIL/uL (ref 4.22–5.81)
RDW: 14.3 % (ref 11.5–15.5)
WBC: 9 10*3/uL (ref 4.0–10.5)
nRBC: 0 % (ref 0.0–0.2)

## 2021-05-18 LAB — MAGNESIUM: Magnesium: 1.5 mg/dL — ABNORMAL LOW (ref 1.7–2.4)

## 2021-05-18 IMAGING — MR MR HEAD W/O CM
12 of 13 series · 44 of 48 positions shown · non-contrast
Comparison: Brain MRI [DATE]. Brain MRI [DATE]. Head CT
[DATE].

CLINICAL DATA: Neuro deficit, acute, stroke suspected.

EXAM:
MRI HEAD WITHOUT CONTRAST
TECHNIQUE: Multiplanar, multiecho pulse sequences of the brain and surrounding
structures were obtained without intravenous contrast.

[Series 5: DWI · axial · 3.0mm · 0.88mm/px · z∈[-45,+104]mm · 8 of 104 slices shown (1 of 4)]
[im 1/104]
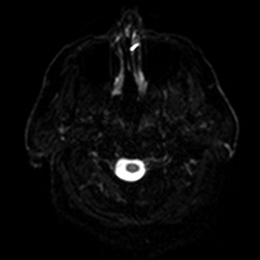
[im 15/104]
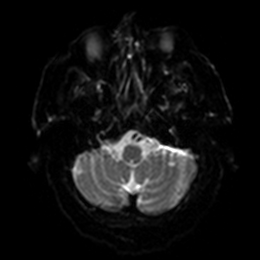
[im 30/104]
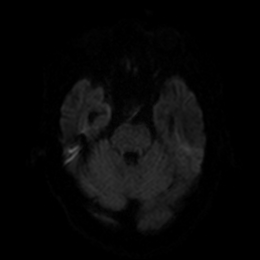
[im 45/104]
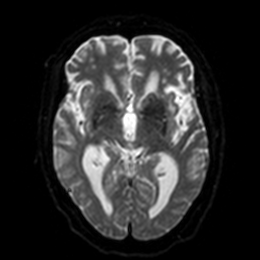
[im 59/104]
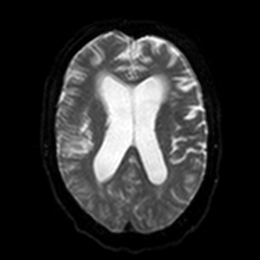
[im 74/104]
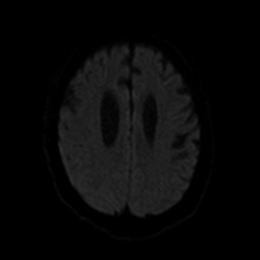
[im 89/104]
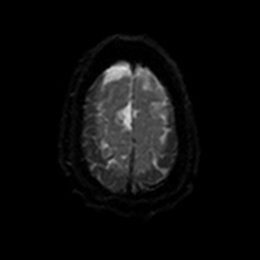
[im 104/104]
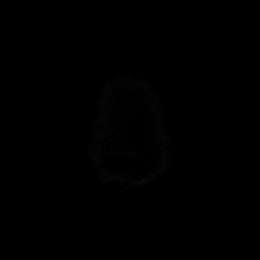

[Series 6: DWI · axial · 3.0mm · 0.88mm/px · z∈[-45,+104]mm · 4 of 52 slices shown (2 of 4)]
[im 1/52]
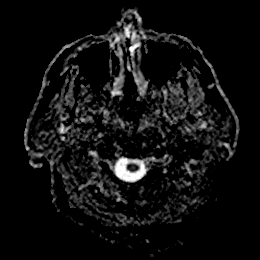
[im 18/52]
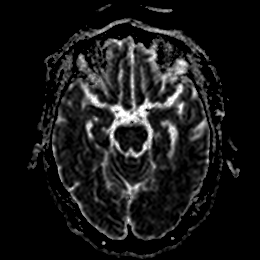
[im 35/52]
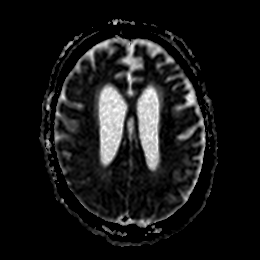
[im 52/52]
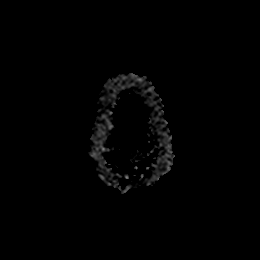

[Series 7: DWI · coronal · 4.0mm · 0.88mm/px · 5 of 72 slices shown (3 of 4)]
[im 1/72]
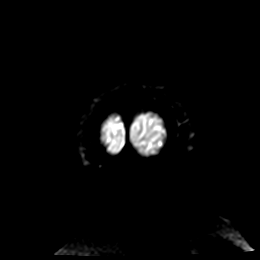
[im 18/72]
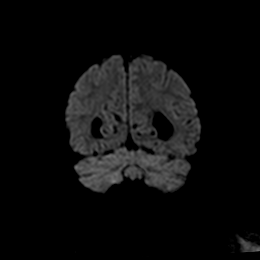
[im 36/72]
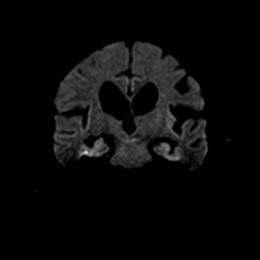
[im 54/72]
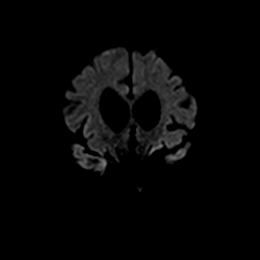
[im 72/72]
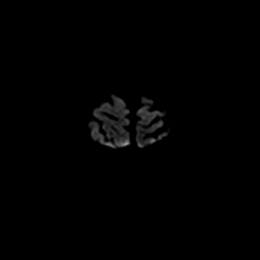

[Series 8: DWI · coronal · 4.0mm · 0.88mm/px · 3 of 36 slices shown (4 of 4)]
[im 1/36]
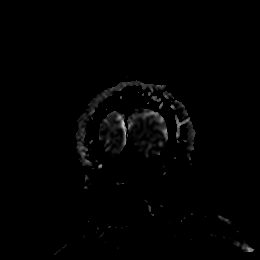
[im 18/36]
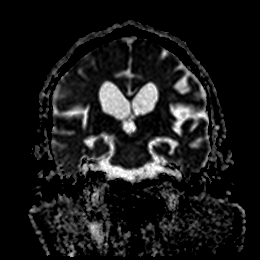
[im 36/36]
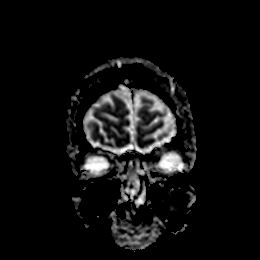

[Series 9: T1 · sagittal · 5.0mm · 0.75mm/px · 2 of 24 slices shown]
[im 1/24]
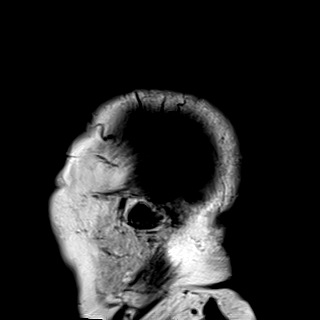
[im 24/24]
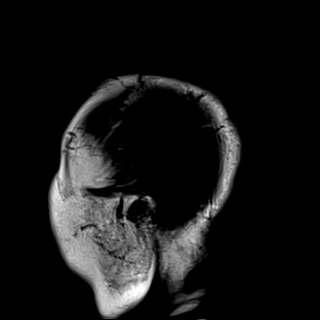

[Series 10: T2 · axial · 5.0mm · 0.72mm/px · z∈[-50,+97]mm · 2 of 26 slices shown (1 of 2)]
[im 1/26]
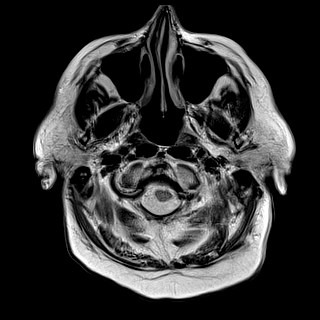
[im 26/26]
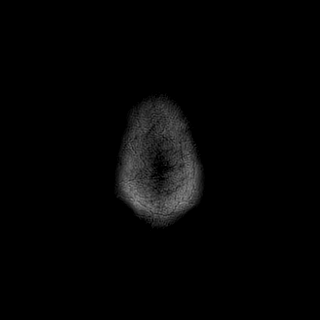

[Series 11: FLAIR · axial · 5.0mm · 0.45mm/px · z∈[-54,+93]mm · 2 of 26 slices shown]
[im 1/26]
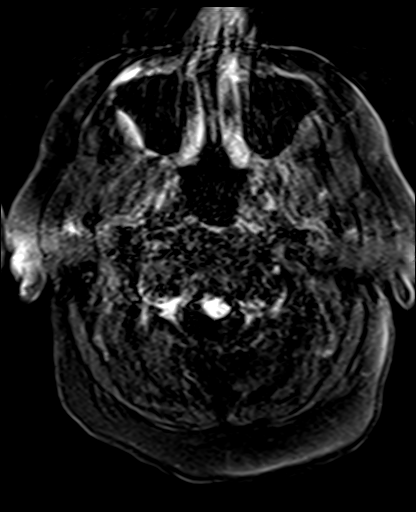
[im 26/26]
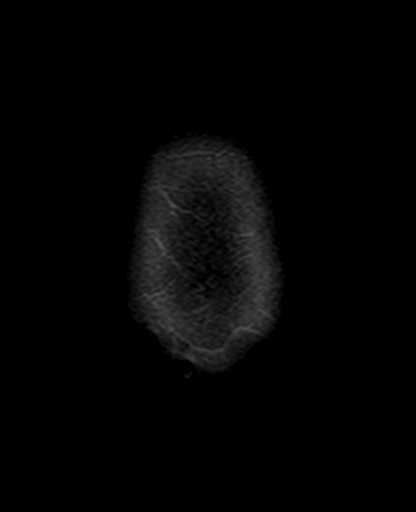

[Series 12: mag_images · axial · 3.0mm · 0.90mm/px · z∈[-68,+106]mm · 4 of 60 slices shown]
[im 1/60]
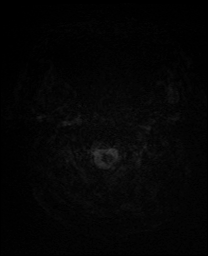
[im 20/60]
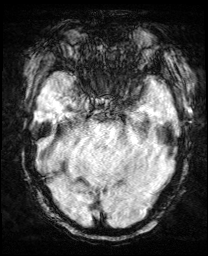
[im 40/60]
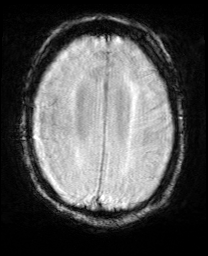
[im 60/60]
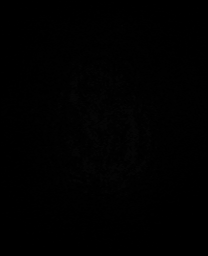

[Series 13: pha_images · axial · 3.0mm · 0.90mm/px · z∈[-65,+103]mm · 4 of 57 slices shown]
[im 1/57]
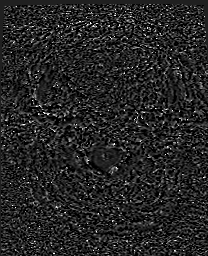
[im 19/57]
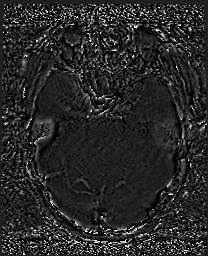
[im 38/57]
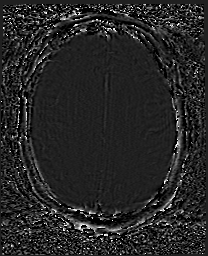
[im 57/57]
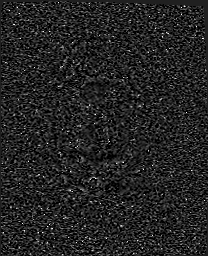

[Series 14: swi_images · axial · 3.0mm · 0.90mm/px · z∈[-68,+106]mm · 4 of 60 slices shown]
[im 1/60]
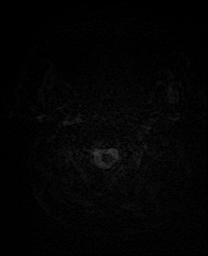
[im 20/60]
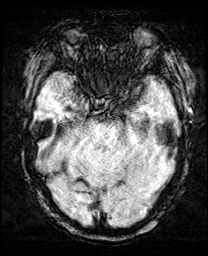
[im 40/60]
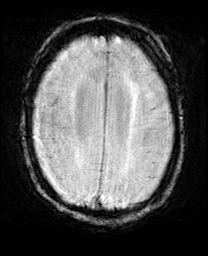
[im 60/60]
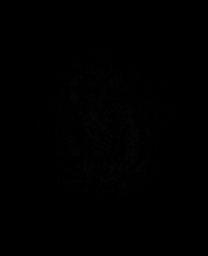

[Series 15: mip_images(sw) · axial · 24.0mm · 0.90mm/px · z∈[-57,+96]mm · 4 of 53 slices shown]
[im 1/53]
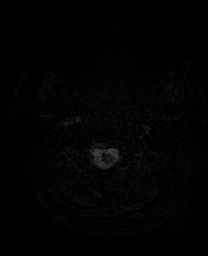
[im 18/53]
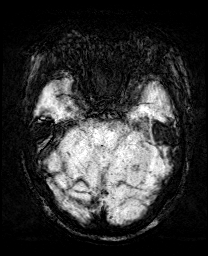
[im 35/53]
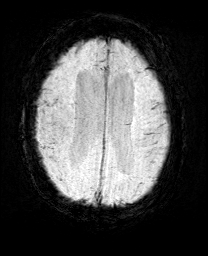
[im 53/53]
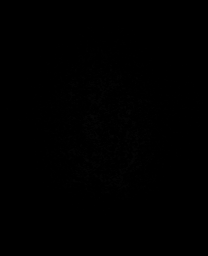

[Series 17: T2 · coronal · 5.0mm · 0.34mm/px · 2 of 30 slices shown (2 of 2)]
[im 1/30]
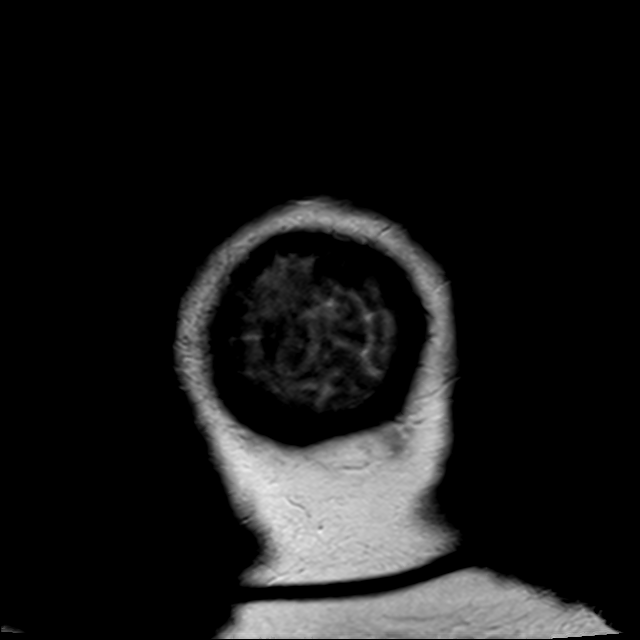
[im 30/30]
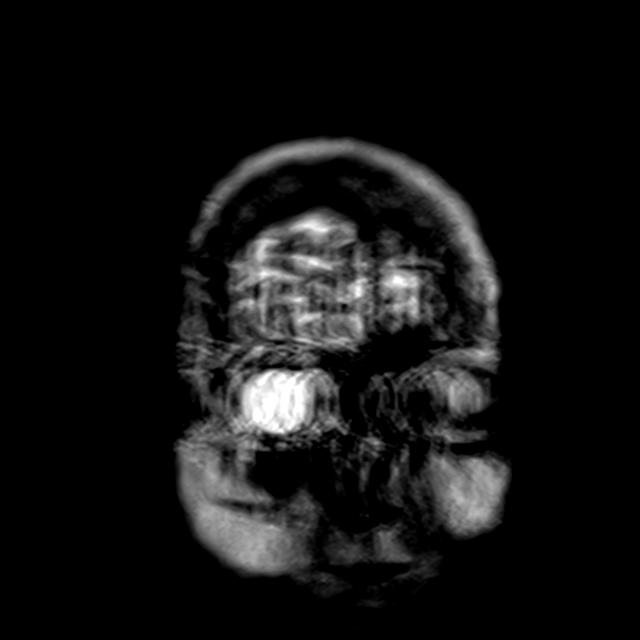

[44 of 48 positions shown; findings below may reference images not displayed]

FINDINGS: Brain:

Intermittently motion degraded examination, limiting evaluation.
Most notably, there is moderate to moderately severe motion
degradation of the axial T2 FLAIR sequence, moderate motion
degradation of the axial T1 weighted sequence and severe motion
degradation of the coronal T2 TSE sequence.

Redemonstrated central predominant cerebral atrophy. Mild cerebellar
atrophy is also present.

Apparent punctate focus of cortical diffusion weighted
hyperintensity within the anterior right frontal lobe, appreciated
on the axial diffusion-weighted sequence only (series 5, image 82).
This may reflect a punctate acute infarct or artifact.

Mild multifocal T2 FLAIR hyperintense signal abnormality within the
cerebral white matter and pons, nonspecific but compatible with
chronic small vessel ischemic disease.

Chronic lacunar infarct within the inferior left cerebellar
hemisphere.

No evidence of an intracranial mass.

No chronic intracranial blood products.

No extra-axial fluid collection.

No midline shift.

Vascular: Maintained flow voids within the proximal large arterial
vessels.

Skull and upper cervical spine: No focal suspicious marrow lesion.

Sinuses/Orbits: Visualized orbits show no acute finding. Right lens
replacement. No significant paranasal sinus disease at the imaged
levels.

Other: Trace fluid within the bilateral mastoid air cells.
IMPRESSION: Intermittently motion degraded examination, as described and
limiting evaluation.

Punctate acute cortical infarct versus artifact within the anterior
right frontal lobe.

Mild chronic small vessel ischemic changes within the cerebral white
matter and pons, similar as compared to the brain MRI of [DATE].

Redemonstrated chronic lacunar infarct within the inferior left
cerebellar hemisphere.

Central predominant cerebral atrophy. Mild cerebellar atrophy is
also present.

Trace fluid within the bilateral mastoid air cells.

## 2021-05-18 IMAGING — CT CT ANGIO NECK
2 of 11 series · 6 of 35 positions shown · IV contrast (omnipaque)
Comparison: None.

CLINICAL DATA: Stroke/TIA, assess extracranial arteries;
Stroke/TIA, assess intracranial arteries

EXAM:
CT ANGIOGRAPHY HEAD AND NECK
TECHNIQUE: Multidetector CT imaging of the head and neck was performed using
the standard protocol during bolus administration of intravenous
contrast. Multiplanar CT image reconstructions and MIPs were
obtained to evaluate the vascular anatomy. Carotid stenosis
measurements (when applicable) are obtained utilizing NASCET
criteria, using the distal internal carotid diameter as the
denominator.
CONTRAST:  75mL OMNIPAQUE IOHEXOL 350 MG/ML SOLN

[Series 8: sag soft · sagittal · 0.38mm/px · 1 of 67 slices shown]
[im 28/67  soft-tissue]
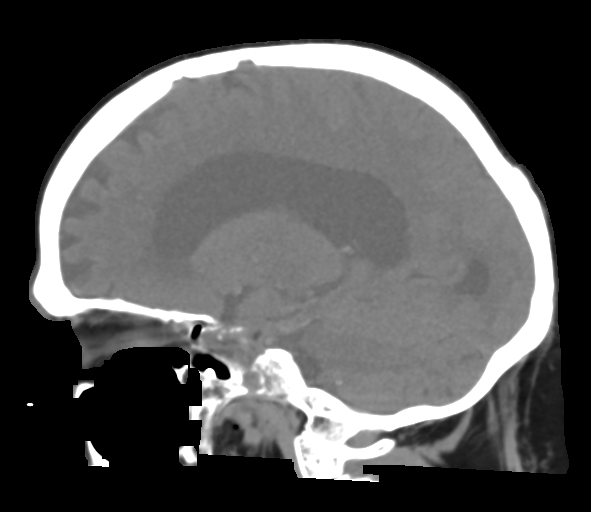

[Series 11: ax thins · axial · 0.39mm/px · z∈[-226,+7]mm · 5 of 354 slices shown]
[im 59/354  soft-tissue]
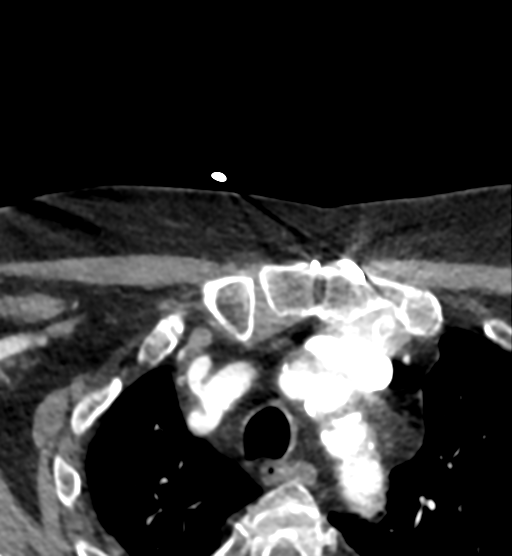
[im 118/354  bone]
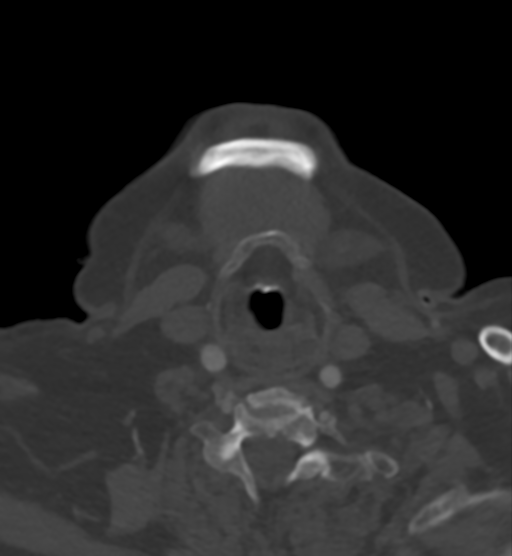
[im 177/354  soft-tissue]
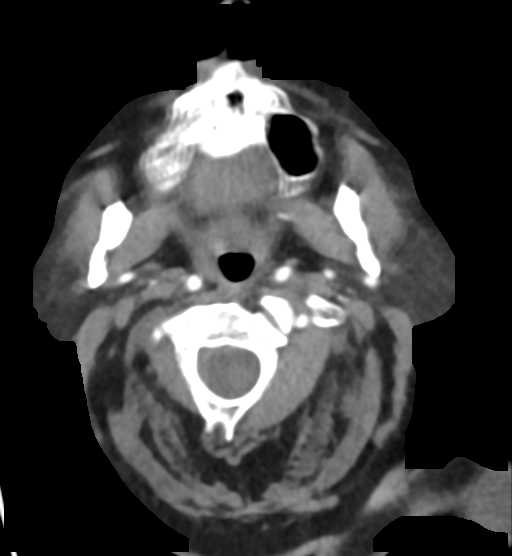
[im 236/354  bone]
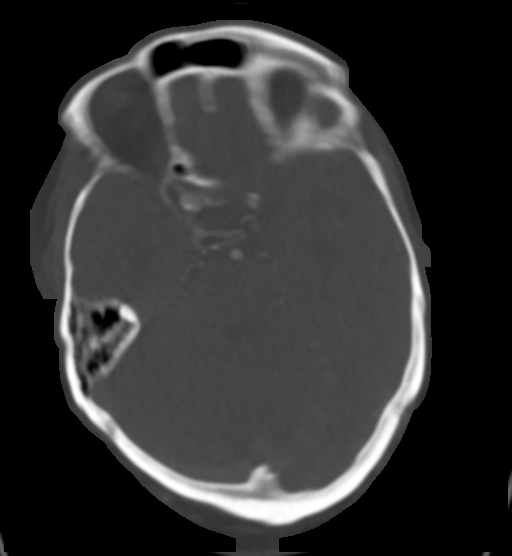
[im 295/354  soft-tissue]
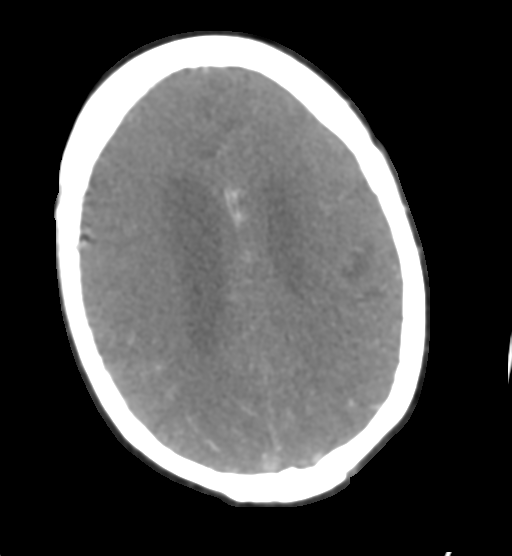

[6 of 35 positions shown; findings below may reference images not displayed]

FINDINGS: CT HEAD FINDINGS

Brain: There is no mass, hemorrhage or extra-axial collection. There
is generalized atrophy without lobar predilection. There is an old
left cerebellar small vessel infarct. There is hypoattenuation of
the periventricular white matter, most commonly indicating chronic
ischemic microangiopathy.

Skull: The visualized skull base, calvarium and extracranial soft
tissues are normal.

Sinuses/Orbits: No fluid levels or advanced mucosal thickening of
the visualized paranasal sinuses. No mastoid or middle ear effusion.
The orbits are normal.

CTA NECK FINDINGS

SKELETON: There is no bony spinal canal stenosis. No lytic or
blastic lesion.

OTHER NECK: Normal pharynx, larynx and major salivary glands. No
cervical lymphadenopathy. Unremarkable thyroid gland.

UPPER CHEST: No pneumothorax or pleural effusion. No nodules or
masses.

AORTIC ARCH:

There is calcific atherosclerosis of the aortic arch. There is no
aneurysm, dissection or hemodynamically significant stenosis of the
visualized portion of the aorta. Conventional 3 vessel aortic
branching pattern. The visualized proximal subclavian arteries are
widely patent.

RIGHT CAROTID SYSTEM: No dissection, occlusion or aneurysm. Mild
atherosclerotic calcification at the carotid bifurcation without
hemodynamically significant stenosis.

LEFT CAROTID SYSTEM: No dissection, occlusion or aneurysm. Mild
atherosclerotic calcification at the carotid bifurcation without
hemodynamically significant stenosis.

VERTEBRAL ARTERIES: Left dominant configuration. Both origins are
clearly patent. There is no dissection, occlusion or flow-limiting
stenosis to the skull base (V1-V3 segments).

CTA HEAD FINDINGS

CTA head images are degraded by motion.

POSTERIOR CIRCULATION:

--Vertebral arteries: Normal V4 segments.

--Inferior cerebellar arteries: Normal.

--Basilar artery: Normal.

--Superior cerebellar arteries: Normal.

--Posterior cerebral arteries (PCA): Normal.

ANTERIOR CIRCULATION:

--Intracranial internal carotid arteries: Normal.

--Anterior cerebral arteries (ACA): Normal. Both A1 segments are
present. Patent anterior communicating artery (a-comm).

--Middle cerebral arteries (MCA): Normal.

VENOUS SINUSES: As permitted by contrast timing, patent.

ANATOMIC VARIANTS: None

Review of the MIP images confirms the above findings.
IMPRESSION: 1. No emergent large vessel occlusion or high-grade stenosis of the
head or neck.
2. Old left cerebellar small vessel infarct.

## 2021-05-18 MED ORDER — MAGNESIUM SULFATE 2 GM/50ML IV SOLN
2.0000 g | Freq: Once | INTRAVENOUS | Status: AC
  Administered 2021-05-18: 2 g via INTRAVENOUS
  Filled 2021-05-18: qty 50

## 2021-05-18 MED ORDER — THIAMINE HCL 100 MG/ML IJ SOLN
500.0000 mg | Freq: Once | INTRAVENOUS | Status: AC
  Administered 2021-05-18: 500 mg via INTRAVENOUS
  Filled 2021-05-18: qty 5

## 2021-05-18 MED ORDER — MAGNESIUM SULFATE 2 GM/50ML IV SOLN: 2.0000 g | Freq: Once | INTRAVENOUS | Status: DC

## 2021-05-18 MED ORDER — NYSTATIN 100000 UNIT/ML MT SUSP
5.0000 mL | Freq: Four times a day (QID) | OROMUCOSAL | Status: DC
  Administered 2021-05-18 – 2021-05-27 (×17): 500000 [IU] via ORAL
  Filled 2021-05-18 (×19): qty 5

## 2021-05-18 MED ORDER — MAGNESIUM SULFATE 2 GM/50ML IV SOLN
2.0000 g | Freq: Once | INTRAVENOUS | Status: DC
  Administered 2021-05-18: 2 g via INTRAVENOUS
  Filled 2021-05-18: qty 50

## 2021-05-18 MED ORDER — POTASSIUM CHLORIDE 10 MEQ/100ML IV SOLN
10.0000 meq | INTRAVENOUS | Status: AC
  Administered 2021-05-18 (×4): 10 meq via INTRAVENOUS
  Filled 2021-05-18 (×4): qty 100

## 2021-05-18 MED ORDER — OLANZAPINE 5 MG PO TBDP
5.0000 mg | ORAL_TABLET | Freq: Once | ORAL | Status: AC | PRN
  Administered 2021-05-18: 5 mg via ORAL
  Filled 2021-05-18 (×2): qty 1

## 2021-05-18 MED ORDER — MAGNESIUM SULFATE 4 GM/100ML IV SOLN
4.0000 g | INTRAVENOUS | Status: DC
  Filled 2021-05-18: qty 100

## 2021-05-18 MED ORDER — IOHEXOL 350 MG/ML SOLN
75.0000 mL | Freq: Once | INTRAVENOUS | Status: AC | PRN
  Administered 2021-05-18: 75 mL via INTRAVENOUS

## 2021-05-18 NOTE — Progress Notes (Signed)
Progress Note  Patient Name: Michael Hines Date of Encounter: 05/18/2021  [facility] Cardiologist: None   Subjective   Not answering questions or following commands.    Inpatient Medications    Scheduled Meds:  clotrimazole   Topical BID   enoxaparin (LOVENOX) injection  40 mg Subcutaneous Q24H   fluticasone furoate-vilanterol  1 puff Inhalation Daily   insulin aspart  0-15 Units Subcutaneous TID WC   insulin aspart  0-5 Units Subcutaneous QHS   metoprolol tartrate  2.5 mg Intravenous Q6H   Continuous Infusions:  dextrose 5 % and 0.9% NaCl 75 mL/hr at 05/18/21 0525   thiamine injection Stopped (05/17/21 1852)   PRN Meds: acetaminophen **OR** acetaminophen, ipratropium-albuterol, OLANZapine zydis, ondansetron **OR** ondansetron (ZOFRAN) IV   Vital Signs    Vitals:   05/18/21 0419 05/18/21 0508 05/18/21 0807 05/18/21 1017  BP:  124/71 (!) 141/91 (!) 161/76  Pulse:  77 71 63  Resp:  17 14 16   Temp:   98.8 F (37.1 C)   TempSrc:   Oral   SpO2:  97% 96% 95%  Weight: 84.7 kg     Height:        Intake/Output Summary (Last 24 hours) at 05/18/2021 1105 Last data filed at 05/18/2021 0525 Gross per 24 hour  Intake 1642.02 ml  Output 900 ml  Net 742.02 ml    Last 3 Weights 05/18/2021 05/17/2021 05/16/2021  Weight (lbs) 186 lb 11.7 oz 187 lb 6.3 oz 192 lb 0.3 oz  Weight (kg) 84.7 kg 85 kg 87.1 kg      Telemetry    16 beat episode of SVT, this morning, normal sinus rhythm with rate 50s to 70s- Personally Reviewed  ECG    No new ECG - Personally Reviewed  Physical Exam   GEN: Confused, not following commands or answering questions Neck: No JVD Cardiac: RRR, 2/6 systolic murmur Respiratory: Clear to auscultation bilaterally in anterior fields GI: Soft, nontender, non-distended  MS: No edema Neuro:  Nonfocal  Psych: Normal affect   Labs    High Sensitivity Troponin:   Recent Labs  Lab 05/16/21 1639 05/16/21 1830  TROPONINIHS 24* 21*       Chemistry Recent Labs  Lab 05/13/21 1654 05/14/21 0630 05/15/21 1225 05/16/21 1639 05/17/21 0726 05/18/21 1008  NA 135 137   < > 132* 130* 131*  K 3.2* 3.7   < > 3.6 4.0 3.3*  CL 101 104   < > 95* 97* 97*  CO2 18* 21*   < > 26 23 25   GLUCOSE 132* 99   < > 199* 108* 112*  BUN 8 8   < > <5* <5* <5*  CREATININE 1.67* 1.13   < > 0.91 0.88 0.83  CALCIUM 8.8* 8.2*   < > 8.4* 8.4* 8.4*  MG  --   --    < > 1.9 1.8 1.5*  PROT 6.3* 5.2*  --   --   --   --   ALBUMIN 2.9* 2.5*  --   --   --   --   AST 21 17  --   --   --   --   ALT <5 5  --   --   --   --   ALKPHOS 118 80  --   --   --   --   BILITOT 1.3* 1.1  --   --   --   --   GFRNONAA 43* >60   < > >  60 >60 >60  ANIONGAP 16* 12   < > 11 10 9    < > = values in this interval not displayed.     Lipids No results for input(s): CHOL, TRIG, HDL, LABVLDL, LDLCALC, CHOLHDL in the last 168 hours.  Hematology Recent Labs  Lab 05/15/21 1225 05/17/21 0726 05/18/21 1008  WBC 9.7 10.7* 9.0  RBC 4.42 5.05 4.76  HGB 13.6 15.5 14.4  HCT 41.2 45.8 43.5  MCV 93.2 90.7 91.4  MCH 30.8 30.7 30.3  MCHC 33.0 33.8 33.1  RDW 14.4 14.3 14.3  PLT 196 235 217    Thyroid  Recent Labs  Lab 05/15/21 1227  TSH 1.957     BNPNo results for input(s): BNP, PROBNP in the last 168 hours.  DDimer  Recent Labs  Lab 05/13/21 2025  DDIMER 1.20*      Radiology    CT Angio Chest Pulmonary Embolism (PE) W or WO Contrast  Result Date: 05/17/2021 CLINICAL DATA:  Shortness of breath EXAM: CT ANGIOGRAPHY CHEST WITH CONTRAST TECHNIQUE: Multidetector CT imaging of the chest was performed using the standard protocol during bolus administration of intravenous contrast. Multiplanar CT image reconstructions and MIPs were obtained to evaluate the vascular anatomy. CONTRAST:  138mL OMNIPAQUE IOHEXOL 350 MG/ML SOLN COMPARISON:  04/12/2021 FINDINGS: Cardiovascular: Satisfactory opacification of the pulmonary arteries to the segmental level. No evidence of pulmonary  embolism. Normal heart size. No pericardial effusion. Prior CABG. Ascending thoracic aortic aneurysm measuring 4.8 cm in transverse diameter at the level of the main pulmonary artery. Thoracic aortic atherosclerosis. Mediastinum/Nodes: No enlarged mediastinal, hilar, or axillary lymph nodes. Thyroid gland, trachea, and esophagus demonstrate no significant findings. Lungs/Pleura: No focal consolidation, pleural effusion or pneumothorax. Mild dependent atelectasis bilaterally. Upper Abdomen: No acute abnormality. Musculoskeletal: No chest wall abnormality. No acute or significant osseous findings. Review of the MIP images confirms the above findings. IMPRESSION: 1. No evidence of a pulmonary embolus. 2. Ascending thoracic aortic aneurysm measuring 4.8 cm in transverse diameter at the level of the main pulmonary artery. Ascending thoracic aortic aneurysm. Recommend semi-annual imaging followup by CTA or MRA and referral to cardiothoracic surgery if not already obtained. This recommendation follows 2010 ACCF/AHA/AATS/ACR/ASA/SCA/SCAI/SIR/STS/SVM Guidelines for the Diagnosis and Management of Patients With Thoracic Aortic Disease. Circulation. 2010; 121: V409-W119. Aortic aneurysm NOS (ICD10-I71.9) Electronically Signed   By: Kathreen Devoid M.D.   On: 05/17/2021 18:21   MR BRAIN WO CONTRAST  Result Date: 05/17/2021 CLINICAL DATA:  Initial evaluation for acute delirium. EXAM: MRI HEAD WITHOUT CONTRAST TECHNIQUE: Multiplanar, multiecho pulse sequences of the brain and surrounding structures were obtained without intravenous contrast. COMPARISON:  From prior CT from 04/12/2021. FINDINGS: Brain: Examination is severely limited as the patient was unable to tolerate the full length of the exam. Axial and coronal DWI weighted sequences, with axial FLAIR and sagittal T1 weighted sequences only were performed. The provided images are severely degraded by motion artifact, particularly the FLAIR and T1 weighted sequence which are  centrally nondiagnostic. Diffusion-weighted imaging demonstrates no definite evidence for acute or subacute infarct. Gray-white matter differentiation grossly maintained. Underlying atrophy noted. Associated ventriculomegaly grossly stable from prior. No visible mass effect or midline shift. No obvious mass lesion or extra-axial fluid collection. Vascular: Not well assessed on this limited exam. Skull and upper cervical spine: Not well assessed on this limited exam. Sinuses/Orbits: Not well assessed on this limited exam. Other: None. IMPRESSION: 1. Severely limited exam due to the patient's inability to tolerate the full length of the  study and extensive motion artifact. No definite evidence for acute infarct. 2. The remainder of the examination is essentially nondiagnostic. Electronically Signed   By: Jeannine Boga M.D.   On: 05/17/2021 00:39   DG CHEST PORT 1 VIEW  Result Date: 05/17/2021 CLINICAL DATA:  Altered mental status, hypoxia EXAM: PORTABLE CHEST 1 VIEW COMPARISON:  05/13/2021 chest radiograph. FINDINGS: Intact sternotomy wires. Stable cardiomediastinal silhouette with mild cardiomegaly and aortic valve prosthesis in place. No pneumothorax. No pleural effusion. No rib pain edema. No acute consolidative airspace disease. IMPRESSION: Mild cardiomegaly without overt pulmonary edema. No active pulmonary disease. Electronically Signed   By: Ilona Sorrel M.D.   On: 05/17/2021 16:23   EEG adult  Result Date: 05/17/2021 Lora Havens, MD     05/17/2021 11:23 AM Patient Name: Michael Hines MRN: [id number] Epilepsy Attending: Lora Havens Referring Physician/Provider: Dr Niel Hummer Date: 05/17/2021 Duration: 24.26 mins Patient history: 75 year old male with altered mental status.  EEG to evaluate for seizures. Level of alertness: Awake, asleep AEDs during EEG study: None Technical aspects: This EEG study was done with scalp electrodes positioned according to the 10-20 International  system of electrode placement. Electrical activity was acquired at a sampling rate of 500Hz  and reviewed with a high frequency filter of 70Hz  and a low frequency filter of 1Hz . EEG data were recorded continuously and digitally stored. Description: No clear posterior dominant rhythm was seen.  Sleep was characterized by sleep spindles (12 to 14 Hz), maximal frontocentral region.  EEG showed continuous generalized predominantly 5 to 7 Hz theta as well as intermittent generalized 2 to 3 Hz delta slowing.  Hyperventilation and photic stimulation were not performed.   Patient was noted to "shaking on his right side" per EEG annotation at 1038 which was difficult to be on camera.  Concomitant EEG before, during and after the event did not show any EEG change to suggest seizure. ABNORMALITY - Continuous slow, generalized IMPRESSION: This study is suggestive of moderate diffuse encephalopathy, nonspecific to etiology.  No seizures or epileptiform discharges were seen throughout the recording. Patient was noted to "shaking on his right side" at 1038 without concomitant EEG change. This was most likely not an epileptic event. Lora Havens   ECHOCARDIOGRAM LIMITED  Result Date: 05/17/2021    ECHOCARDIOGRAM LIMITED REPORT   Patient Name:   Michael Hines Date of Exam: 05/17/2021 Medical Rec #:  [id number]         Height:       67.0 in Accession #:    [id number]        Weight:       187.4 lb Date of Birth:  29-Jun-1946         BSA:          1.967 m Patient Age:    75 years          BP:           122/78 mmHg Patient Gender: M                 HR:           68 bpm. Exam Location:  Inpatient Procedure: Limited Echo, Limited Color Doppler and Cardiac Doppler Indications:    dyspnea. status post aortic valve replacement.  History:        Patient has prior history of Echocardiogram examinations. COPD                 and sepsis. Parkinson's;  Risk Factors:Diabetes and Dyslipidemia.                 Aortic Valve: 21 mm  bioprosthetic valve is present in the aortic                 position.  Sonographer:    Johny Chess RDCS Referring Phys: [id number] [place]  1. Left ventricular ejection fraction, by estimation, is 60 to 65%. The left ventricle has normal function. The left ventricle has no regional wall motion abnormalities. Left ventricular diastolic parameters are consistent with Grade I diastolic dysfunction (impaired relaxation).  2. Right ventricular systolic function is normal. The right ventricular size is normal.  3. The mitral valve is normal in structure. No evidence of mitral valve regurgitation. No evidence of mitral stenosis.  4. The aortic valve has been repaired/replaced. Aortic valve regurgitation is not visualized. No aortic stenosis is present. 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 Aortic valve mean gradient measures 10.0 mmHg. Aortic valve  Vmax measures 2.20 m/s.  5. The inferior vena cava is normal in size with greater than 50% respiratory variability, suggesting right atrial pressure of 3 mmHg. Comparison(s): No significant change from prior study. Prior images reviewed side by side. FINDINGS  Left Ventricle: Left ventricular ejection fraction, by estimation, is 60 to 65%. The left ventricle has normal function. The left ventricle has no regional wall motion abnormalities. The left ventricular internal cavity size was normal in size. There is  no left ventricular hypertrophy. Left ventricular diastolic parameters are consistent with Grade I diastolic dysfunction (impaired relaxation). Right Ventricle: The right ventricular size is normal. No increase in right ventricular wall thickness. Right ventricular systolic function is normal. Left Atrium: Left atrial size was normal in size. Right Atrium: Right atrial size was normal in size. Pericardium: There is no evidence of pericardial effusion. Mitral Valve: The mitral valve is normal in structure. No  evidence of mitral valve stenosis. Tricuspid Valve: The tricuspid valve is normal in structure. Tricuspid valve regurgitation is not demonstrated. No evidence of tricuspid stenosis. Aortic Valve: The aortic valve has been repaired/replaced. Aortic valve regurgitation is not visualized. No aortic stenosis is present. Aortic valve mean gradient measures 10.0 mmHg. Aortic valve peak gradient measures 19.4 mmHg. Aortic valve area, by VTI measures 0.84 cm. 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 Pulmonic Valve: The pulmonic valve was normal in structure. Pulmonic valve regurgitation is not visualized. No evidence of pulmonic stenosis. Aorta: The aortic root is normal in size and structure. Venous: The inferior vena cava is normal in size with greater than 50% respiratory variability, suggesting right atrial pressure of 3 mmHg. IAS/Shunts: No atrial level shunt detected by color flow Doppler. LEFT VENTRICLE PLAX 2D LVIDd:         3.80 cm   Diastology LVIDs:         2.90 cm   LV e' medial:    5.87 cm/s LV PW:         1.00 cm   LV E/e' medial:  7.4 LV IVS:        1.00 cm   LV e' lateral:   9.25 cm/s LVOT diam:     1.70 cm   LV E/e' lateral: 4.7 LV SV:         34 LV SV Index:   17 LVOT Area:     2.27 cm  IVC IVC diam: 1.20 cm AORTIC  VALVE AV Area (Vmax):    0.88 cm AV Area (Vmean):   0.84 cm AV Area (VTI):     0.84 cm AV Vmax:           220.00 cm/s AV Vmean:          148.000 cm/s AV VTI:            0.398 m AV Peak Grad:      19.4 mmHg AV Mean Grad:      10.0 mmHg LVOT Vmax:         85.05 cm/s LVOT Vmean:        54.650 cm/s LVOT VTI:          0.148 m LVOT/AV VTI ratio: 0.37  AORTA Ao Root diam: 3.40 cm Ao Asc diam:  3.90 cm MITRAL VALVE MV Area (PHT): 1.98 cm    SHUNTS MV Decel Time: 384 msec    Systemic VTI:  0.15 m MV E velocity: 43.70 cm/s  Systemic Diam: 1.70 cm MV A velocity: 87.80 cm/s MV E/A ratio:  0.50 Candee Furbish MD Electronically signed by Candee Furbish MD Signature Date/Time:  05/17/2021/4:40:36 PM    Final     Cardiac Studies     Patient Profile     75 y.o. male with a hx of COPD, T2DM, hyperlipidemia, bioprosthetic aortic valve, recent MSSA bacteremia who is being seen  for the evaluation of tachycardia    Assessment & Plan    SVT: episodes regular narrow complex tachycardia lasting up to 15 minutes, rate 190.  Consistent with SVT.  -Started PO metoprolol 25 mg twice daily but he remains confused and is refusing any oral medications.  Switched to IV metoprolol 2.5 mg every 6 hours; SVT appears improved, would continue scheduled IV metoprolol and can transition back to p.o. once able -Maintain K>4, Mag>2   Status post AVR:  recent MSSA bacteremia.  TEE on 04/18/2021 showed 21 mm bioprosthetic aortic valve (V-max 2.6 m/s, mean gradient 15 mmHg, EOA 1.7 cm, DI 0.54), mild central AI, no definitive evidence of endocarditis, normal biventricular function, mild MR, ascending aortic aneurysm measuring 45 mm. - Echo 10/7 shows normal biventricular function, bioprosthetic AV with MG 10 mmHg  For questions or updates, please contact [facility] Please consult [email or web address] for contact info under        Signed, Donato Heinz, MD  05/18/2021, 11:05 AM

## 2021-05-18 NOTE — Progress Notes (Signed)
Neurology Progress Note  Patient ID: Michael Hines is a 75 y.o. with PMHx of  has a past medical history of Anxiety, B12 deficiency, Back pain, Diabetes mellitus without complication ([place]), Hyperlipidemia, Hypotension (05/13/2021), Hypothermia (05/13/2021), Severe sepsis with septic shock ([place]) (05/13/2021), Tachycardia (05/13/2021), Tachypnea (05/13/2021), and Testosterone insufficiency.  Initially consulted for: Altered mental status  Subjective: - Eating 25%  of meals per nursing, ate only pudding yesterday but real food today  - More interactive with nursing today  - No acute complaints from patient   On further review of chart he was additionally admitted on 09/07/2020 with altered mental status in the setting of sepsis of undetermined source, as well as 09/14/2018 and 04/19/2021 found to have MSSA with negative TEE   Exam: Vitals:   05/18/21 1224 05/18/21 1806  BP: (!) 164/75 (!) 149/83  Pulse: 60   Resp: 14 17  Temp: 98.2 F (36.8 C) 98 F (36.7 C)  SpO2: 93%    Gen: In bed, comfortable  Resp: non-labored breathing, no grossly audible wheezing Cardiac: Perfusing extremities well  Abd: soft, nt Skin: No clear Janeway lesions or Osler nodes though he does have some bruising from IVs etc. Nailbeds in all 4 extremities do not show any evidence of embolic events  Neuro: MS: Attention remains poor but he is conversant and follows simple commands, sometimes requiring encouragement because he does not want to participate in examination, able to name and repeat.  Oriented to [facility] but reports it is August 1974 CN: Orients to stimuli in all visual fields, tracks with slightly saccadic pursuits, face symmetric, tongue midline Motor: Briefly moves all 4 extremities antigravity but will not maintain them for drift testing and will not participate in confrontational strength testing Sensory: Equally reactive to touch in all 4 extremities   Pertinent Labs:  Lab Results   Component Value Date   HGBA1C 7.9 (H) 04/12/2021   No results found for: CHOL, HDL, LDLCALC, LDLDIRECT, TRIG, CHOLHDL   Basic Metabolic Panel: Recent Labs  Lab 05/15/21 1225 05/16/21 0155 05/16/21 1639 05/17/21 0726 05/18/21 1008  NA 133* 133* 132* 130* 131*  K 3.0* 3.3* 3.6 4.0 3.3*  CL 99 98 95* 97* 97*  CO2 23 21* 26 23 25   GLUCOSE 95 68* 199* 108* 112*  BUN <5* <5* <5* <5* <5*  CREATININE 0.82 0.74 0.91 0.88 0.83  CALCIUM 8.1* 8.2* 8.4* 8.4* 8.4*  MG  --  1.1* 1.9 1.8 1.5*    CBC: Recent Labs  Lab 05/13/21 1654 05/14/21 0630 05/15/21 1225 05/17/21 0726 05/18/21 1008  WBC 11.3* 9.4 9.7 10.7* 9.0  NEUTROABS 9.4* 6.4  --   --   --   HGB 16.3 13.7 13.6 15.5 14.4  HCT 51.0 42.2 41.2 45.8 43.5  MCV 95.9 94.0 93.2 90.7 91.4  PLT 244 202 196 235 217    Coagulation Studies: No results for input(s): LABPROT, INR in the last 72 hours.   Lab Results  Component Value Date   CHOL 201 (H) 05/18/2021   HDL 32 (L) 05/18/2021   LDLCALC 143 (H) 05/18/2021   TRIG 131 05/18/2021   CHOLHDL 6.3 05/18/2021   Lab Results  Component Value Date   HGBA1C 7.9 (H) 04/12/2021      MRI brain personally reviewed, Intermittently motion degraded examination, as described and limiting evaluation. Punctate acute cortical infarct versus artifact within the anterior right frontal lobe. [Neurology MD favors artifact given this is not clearly visible on the coronal  sections and only in axial sections in an area prone to artifact and motion degraded exam]  Mild chronic small vessel ischemic changes within the cerebral white matter and pons, similar as compared to the brain MRI of 02/06/2020. 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 Central predominant cerebral atrophy. Mild cerebellar atrophy is also present.  CTA personally reviewed:  1. No emergent large vessel occlusion or high-grade stenosis of the head or neck. 2. 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   Impression: Patient is improving substantially from prior evaluation.  Continue to suspect delirium secondary to his sepsis of unclear etiology, similar to prior presentations he has had.  Given improvement, low concern for intermittent seizure activity daily are not capturing, I do not think further EEG would be useful. Discussed with cardiology, while the windows were somewhat limited given his body habitus for TTE, overall the picture does not appear to be consistent with endocarditis, and I do favor that his MRI findings represent artifact and not stroke.  Furthermore the anesthesia required for TEE could be significantly detrimental and patient recovering from delirium with potentially underlying dementia as substantial cognitive decline can be seen postanesthesia in these patients, therefore risk does not outweigh benefit in this case  Recommendations: -10/9 please discuss with cardiology whether transthoracic echo is adequately sufficient to rule out endocarditis versus planning for TEE -CT angio head and neck ordered to complete vessel imaging for stroke work-up, personally reviewed as above and this was also reassuring -Lipid panel ordered for stroke work-up, LDL goal is less than 70, given LDL is 143, started atorvastatin 40 mg nightly -Adjustment of diet/insulin regimen per primary team to achieve A1c goal of less than 7%, currently 7.9% -Outpatient cognitive evaluation for dementia work-up discussed with wife earlier, and she noted he is likely to refuse this type of detailed testing which limits ability to neurologically prognosticate -Continue to encourage CPAP use and medication adherence -Goals of care discussion per primary team -Neurology signing off at this time.  Please reconsult if any questions or concerns arise  Lesleigh Noe MD-PhD [facility] [phone]

## 2021-05-18 NOTE — Progress Notes (Signed)
PROGRESS NOTE    Michael Hines  OVF:[id number] DOB: Sep 28, 1945 DOA: 05/13/2021 PCP: Finis Bud, MD   Brief Narrative: 75 year old with past medical history significant for COPD, type 2 diabetes, hyperlipidemia, bioprosthetic aortic valve replacement and recent MSSA septicemia status post 4 weeks of IV Ancef which he will have finished 10/3 presented to the ID clinic for follow-up and found to be more confused than normal.  He is was diaphoretic and heart rate was in the 130s, temperature was 92.  They were not able to check his blood pressure.  He was transferred to the ED for further evaluation.  Patient was at a skilled nursing facility finishing up his IV antibiotic therapy.    Assessment & Plan:   Principal Problem:   Sepsis with acute organ dysfunction (HCC) Active Problems:   Diabetes mellitus without complication ([place])   S/P AVR   Diabetic polyneuropathy associated with type 2 diabetes mellitus (HCC)   Chronic bilateral low back pain without sciatica   Hypotension   Hypothermia   AKI (acute kidney injury) ([place])   Sepsis with acute organ dysfunction without septic shock ([place])  SIRS;  Patient presents with confusion, hypothermia, hypotension. Not Clear source of infection. Sepsis was ruled out.  Blood cultures no growth .  Chest x-ray no evidence for infection. Urine culture no growth to date.  ID was consulted due to recent MSSA bacteremia.  Patient was a started on cefepime and vancomycin.  Subsequently vancomycin was discontinued.  Cefepime was discontinued 10/5.   Acute metabolic encephalopathy: Delirium, in the setting of recent infection, dehydration. Question Cefepime .  B12 984, thiamine level pending, started IV thiamine. Ammonia level 21 normal, B12 984 normal.  TSH 1.9 normal EEG; Moderate to diffuse encephalopathy.  MRI negative for acute stroke.  ABG hypoxemia; will place on oxygen.  Neurology consulted. Plan to repeat MRI with sedation.  He is  more alert today, he say one or two words, confuse.   Hypoxemia;  CT angio negative for PE>  Continue  on 3 L oxygen.   Hyponatremia , hypokalemia, Hypomagnesemia;  Replete IV KCL.  Replete IV mg.   SVT:  Patient had HR up 190-200.  Appreciate cardiology eval.  Plan for IV metoprolol, patient not taking oral meds.  ECHO : normal EF Replete mg and K.   Nutrition;  He has not been eating, refuse to eat.  Will discussed with family about tube feeding.   Oral thrush:  Nystatin.   Type 2 diabetes: Continue with a sliding scale insulin.   Parkinsonism:  Vascular parkinsonism; I discussed with Dr Martie Round, patient's primary neurologist about holding Carbidopa-levodopa. He said we can hold for now, and he can reassess out patient needs for this medications.   Radiculopathy: Plan to hold gabapentin due to AMS.    Elevated D-dimer: Doppler lower extremity negative for DVT.    Estimated body mass index is 29.25 kg/m as calculated from the following:   Height as of this encounter: 5\' 7"  (1.702 m).   Weight as of this encounter: 84.7 kg.   DVT prophylaxis: Lovenox Code Status: Full code Family Communication: Wife who was at bedside Disposition Plan:  Status is: Inpatient  Remains inpatient appropriate because:IV treatments appropriate due to intensity of illness or inability to take PO  Dispo: The patient is from: SNF              Anticipated d/c is to: SNF  Patient currently is not medically stable to d/c.   Difficult to place patient No        Consultants:  ID  Procedures:    Antimicrobials:    Subjective: He is more alert today, refuse to eat.  Refuse oral meds.  He is confuse. He would say one or two words.    Objective: Vitals:   05/18/21 0508 05/18/21 0807 05/18/21 1017 05/18/21 1224  BP: 124/71 (!) 141/91 (!) 161/76 (!) 164/75  Pulse: 77 71 63 60  Resp: 17 14 16 14   Temp:  98.8 F (37.1 C)  98.2 F (36.8 C)  TempSrc:  Oral   Axillary  SpO2: 97% 96% 95% 93%  Weight:      Height:        Intake/Output Summary (Last 24 hours) at 05/18/2021 1412 Last data filed at 05/18/2021 0525 Gross per 24 hour  Intake 1278.22 ml  Output 900 ml  Net 378.22 ml    Filed Weights   05/16/21 0339 05/17/21 0459 05/18/21 0419  Weight: 87.1 kg 85 kg 84.7 kg    Examination:  General exam: Chronic ill appearing Respiratory system: CTA Cardiovascular system: S 1, S 2 RRR Gastrointestinal system: BS present, soft, nt Central nervous system: alert, confuse Extremities: no edema   Data Reviewed: I have personally reviewed following labs and imaging studies  CBC: Recent Labs  Lab 05/13/21 1654 05/14/21 0630 05/15/21 1225 05/17/21 0726 05/18/21 1008  WBC 11.3* 9.4 9.7 10.7* 9.0  NEUTROABS 9.4* 6.4  --   --   --   HGB 16.3 13.7 13.6 15.5 14.4  HCT 51.0 42.2 41.2 45.8 43.5  MCV 95.9 94.0 93.2 90.7 91.4  PLT 244 202 196 235 154    Basic Metabolic Panel: Recent Labs  Lab 05/15/21 1225 05/16/21 0155 05/16/21 1639 05/17/21 0726 05/18/21 1008  NA 133* 133* 132* 130* 131*  K 3.0* 3.3* 3.6 4.0 3.3*  CL 99 98 95* 97* 97*  CO2 23 21* 26 23 25   GLUCOSE 95 68* 199* 108* 112*  BUN <5* <5* <5* <5* <5*  CREATININE 0.82 0.74 0.91 0.88 0.83  CALCIUM 8.1* 8.2* 8.4* 8.4* 8.4*  MG  --  1.1* 1.9 1.8 1.5*    GFR: Estimated Creatinine Clearance: 82.4 mL/min (by C-G formula based on SCr of 0.83 mg/dL). Liver Function Tests: Recent Labs  Lab 05/13/21 1654 05/14/21 0630  AST 21 17  ALT <5 5  ALKPHOS 118 80  BILITOT 1.3* 1.1  PROT 6.3* 5.2*  ALBUMIN 2.9* 2.5*    No results for input(s): LIPASE, AMYLASE in the last 168 hours. Recent Labs  Lab 05/15/21 1315  AMMONIA 21    Coagulation Profile: No results for input(s): INR, PROTIME in the last 168 hours. Cardiac Enzymes: No results for input(s): CKTOTAL, CKMB, CKMBINDEX, TROPONINI in the last 168 hours. BNP (last 3 results) No results for input(s): PROBNP in the  last 8760 hours. HbA1C: No results for input(s): HGBA1C in the last 72 hours. CBG: Recent Labs  Lab 05/17/21 1257 05/17/21 1632 05/17/21 2145 05/18/21 0826 05/18/21 1229  GLUCAP 111* 137* 109* 109* 121*    Lipid Profile: No results for input(s): CHOL, HDL, LDLCALC, TRIG, CHOLHDL, LDLDIRECT in the last 72 hours. Thyroid Function Tests: No results for input(s): TSH, T4TOTAL, FREET4, T3FREE, THYROIDAB in the last 72 hours.  Anemia Panel: No results for input(s): VITAMINB12, FOLATE, FERRITIN, TIBC, IRON, RETICCTPCT in the last 72 hours.  Sepsis Labs: Recent Labs  Lab 05/13/21 1713  05/13/21 2025 05/14/21 0000 05/14/21 0630 05/15/21 1228  PROCALCITON  --   --  0.11 0.21 <0.10  LATICACIDVEN 2.0* 1.4  --   --   --      Recent Results (from the past 240 hour(s))  Blood Culture (routine x 2)     Status: None (Preliminary result)   Collection Time: 05/13/21  5:13 PM   Specimen: BLOOD  Result Value Ref Range Status   Specimen Description BLOOD LEFT ANTECUBITAL  Final   Special Requests   Final    BOTTLES DRAWN AEROBIC AND ANAEROBIC Blood Culture results may not be optimal due to an inadequate volume of blood received in culture bottles   Culture   Final    NO GROWTH 4 DAYS Performed at [facility], [place] [address] [place], [place] [address]    Report Status PENDING  Incomplete  Resp Panel by RT-PCR (Flu A&B, Covid) Nasopharyngeal Swab     Status: None   Collection Time: 05/13/21  5:22 PM   Specimen: Nasopharyngeal Swab; Nasopharyngeal(NP) swabs in vial transport medium  Result Value Ref Range Status   SARS Coronavirus 2 by RT PCR NEGATIVE NEGATIVE Final    Comment: (NOTE) SARS-CoV-2 target nucleic acids are NOT DETECTED.  The SARS-CoV-2 RNA is generally detectable in upper respiratory specimens during the acute phase of infection. The lowest concentration of SARS-CoV-2 viral copies this assay can detect is 138 copies/mL. A negative result does not preclude  SARS-Cov-2 infection and should not be used as the sole basis for treatment or other patient management decisions. A negative result may occur with  improper specimen collection/handling, submission of specimen other than nasopharyngeal swab, presence of viral mutation(s) within the areas targeted by this assay, and inadequate number of viral copies(<138 copies/mL). A negative result must be combined with clinical observations, patient history, and epidemiological information. The expected result is Negative.  Fact Sheet for Patients:  EntrepreneurPulse.com.au  Fact Sheet for Healthcare Providers:  IncredibleEmployment.be  This test is no t yet approved or cleared by the [place] FDA and  has been authorized for detection and/or diagnosis of SARS-CoV-2 by FDA under an Emergency Use Authorization (EUA). This EUA will remain  in effect (meaning this test can be used) for the duration of the COVID-19 declaration under Section 564(b)(1) of the Act, 21 U.S.C.section 360bbb-3(b)(1), unless the authorization is terminated  or revoked sooner.       Influenza A by PCR NEGATIVE NEGATIVE Final   Influenza B by PCR NEGATIVE NEGATIVE Final    Comment: (NOTE) The Xpert Xpress SARS-CoV-2/FLU/RSV plus assay is intended as an aid in the diagnosis of influenza from Nasopharyngeal swab specimens and should not be used as a sole basis for treatment. Nasal washings and aspirates are unacceptable for Xpert Xpress SARS-CoV-2/FLU/RSV testing.  Fact Sheet for Patients: EntrepreneurPulse.com.au  Fact Sheet for Healthcare Providers: IncredibleEmployment.be  This test is not yet approved or cleared by the [place] FDA and has been authorized for detection and/or diagnosis of SARS-CoV-2 by FDA under an Emergency Use Authorization (EUA). This EUA will remain in effect (meaning this test can be used) for the duration of  the COVID-19 declaration under Section 564(b)(1) of the Act, 21 U.S.C. section 360bbb-3(b)(1), unless the authorization is terminated or revoked.  Performed at [facility], [place] [address] [place], [place] [address]   Blood Culture (routine x 2)     Status: None (Preliminary result)   Collection Time: 05/13/21  5:39 PM  Specimen: BLOOD  Result Value Ref Range Status   Specimen Description BLOOD RIGHT ANTECUBITAL  Final   Special Requests   Final    BOTTLES DRAWN AEROBIC AND ANAEROBIC Blood Culture results may not be optimal due to an inadequate volume of blood received in culture bottles   Culture   Final    NO GROWTH 4 DAYS Performed at [place] [address] [place], [place] [address]    Report Status PENDING  Incomplete  Urine Culture     Status: None   Collection Time: 05/15/21  1:16 PM   Specimen: Urine, Clean Catch  Result Value Ref Range Status   Specimen Description URINE, CLEAN CATCH  Final   Special Requests NONE  Final   Culture   Final    NO GROWTH Performed at [facility], [place] [address], [place], [place] [address]    Report Status 05/16/2021 FINAL  Final  MRSA Next Gen by PCR, Nasal     Status: None   Collection Time: 05/15/21  3:54 PM   Specimen: Nasal Mucosa; Nasal Swab  Result Value Ref Range Status   MRSA by PCR Next Gen NOT DETECTED NOT DETECTED Final    Comment: (NOTE) The GeneXpert MRSA Assay (FDA approved for NASAL specimens only), is one component of a comprehensive MRSA colonization surveillance program. It is not intended to diagnose MRSA infection nor to guide or monitor treatment for MRSA infections. Test performance is not FDA approved in patients less than 70 years old. Performed at [facility], [place] [address], [place], [place] [address]           Radiology Studies: CT Angio Chest Pulmonary Embolism (PE) W or WO Contrast  Result Date: 05/17/2021 CLINICAL DATA:  Shortness of breath EXAM: CT  ANGIOGRAPHY CHEST WITH CONTRAST TECHNIQUE: Multidetector CT imaging of the chest was performed using the standard protocol during bolus administration of intravenous contrast. Multiplanar CT image reconstructions and MIPs were obtained to evaluate the vascular anatomy. CONTRAST:  175mL OMNIPAQUE IOHEXOL 350 MG/ML SOLN COMPARISON:  04/12/2021 FINDINGS: Cardiovascular: Satisfactory opacification of the pulmonary arteries to the segmental level. No evidence of pulmonary embolism. Normal heart size. No pericardial effusion. Prior CABG. Ascending thoracic aortic aneurysm measuring 4.8 cm in transverse diameter at the level of the main pulmonary artery. Thoracic aortic atherosclerosis. Mediastinum/Nodes: No enlarged mediastinal, hilar, or axillary lymph nodes. Thyroid gland, trachea, and esophagus demonstrate no significant findings. Lungs/Pleura: No focal consolidation, pleural effusion or pneumothorax. Mild dependent atelectasis bilaterally. Upper Abdomen: No acute abnormality. Musculoskeletal: No chest wall abnormality. No acute or significant osseous findings. Review of the MIP images confirms the above findings. IMPRESSION: 1. No evidence of a pulmonary embolus. 2. Ascending thoracic aortic aneurysm measuring 4.8 cm in transverse diameter at the level of the main pulmonary artery. Ascending thoracic aortic aneurysm. Recommend semi-annual imaging followup by CTA or MRA and referral to cardiothoracic surgery if not already obtained. This recommendation follows 2010 ACCF/AHA/AATS/ACR/ASA/SCA/SCAI/SIR/STS/SVM Guidelines for the Diagnosis and Management of Patients With Thoracic Aortic Disease. Circulation. 2010; 121: V893-Y101. Aortic aneurysm NOS (ICD10-I71.9) Electronically Signed   By: Kathreen Devoid M.D.   On: 05/17/2021 18:21   MR BRAIN WO CONTRAST  Result Date: 05/17/2021 CLINICAL DATA:  Initial evaluation for acute delirium. EXAM: MRI HEAD WITHOUT CONTRAST TECHNIQUE: Multiplanar, multiecho pulse sequences of the  brain and surrounding structures were obtained without intravenous contrast. COMPARISON:  From prior CT from 04/12/2021. FINDINGS: Brain: Examination is severely limited as  the patient was unable to tolerate the full length of the exam. Axial and coronal DWI weighted sequences, with axial FLAIR and sagittal T1 weighted sequences only were performed. The provided images are severely degraded by motion artifact, particularly the FLAIR and T1 weighted sequence which are centrally nondiagnostic. Diffusion-weighted imaging demonstrates no definite evidence for acute or subacute infarct. Gray-white matter differentiation grossly maintained. Underlying atrophy noted. Associated ventriculomegaly grossly stable from prior. No visible mass effect or midline shift. No obvious mass lesion or extra-axial fluid collection. Vascular: Not well assessed on this limited exam. Skull and upper cervical spine: Not well assessed on this limited exam. Sinuses/Orbits: Not well assessed on this limited exam. Other: None. IMPRESSION: 1. Severely limited exam due to the patient's inability to tolerate the full length of the study and extensive motion artifact. No definite evidence for acute infarct. 2. The remainder of the examination is essentially nondiagnostic. Electronically Signed   By: Jeannine Boga M.D.   On: 05/17/2021 00:39   DG CHEST PORT 1 VIEW  Result Date: 05/17/2021 CLINICAL DATA:  Altered mental status, hypoxia EXAM: PORTABLE CHEST 1 VIEW COMPARISON:  05/13/2021 chest radiograph. FINDINGS: Intact sternotomy wires. Stable cardiomediastinal silhouette with mild cardiomegaly and aortic valve prosthesis in place. No pneumothorax. No pleural effusion. No rib pain edema. No acute consolidative airspace disease. IMPRESSION: Mild cardiomegaly without overt pulmonary edema. No active pulmonary disease. Electronically Signed   By: Ilona Sorrel M.D.   On: 05/17/2021 16:23   EEG adult  Result Date: 05/17/2021 Lora Havens, MD     05/17/2021 11:23 AM Patient Name: WEBSTER PATRONE MRN: [id number] Epilepsy Attending: Lora Havens Referring Physician/Provider: Dr Niel Hummer Date: 05/17/2021 Duration: 24.26 mins Patient history: 75 year old male with altered mental status.  EEG to evaluate for seizures. Level of alertness: Awake, asleep AEDs during EEG study: None Technical aspects: This EEG study was done with scalp electrodes positioned according to the 10-20 International system of electrode placement. Electrical activity was acquired at a sampling rate of 500Hz  and reviewed with a high frequency filter of 70Hz  and a low frequency filter of 1Hz . EEG data were recorded continuously and digitally stored. Description: No clear posterior dominant rhythm was seen.  Sleep was characterized by sleep spindles (12 to 14 Hz), maximal frontocentral region.  EEG showed continuous generalized predominantly 5 to 7 Hz theta as well as intermittent generalized 2 to 3 Hz delta slowing.  Hyperventilation and photic stimulation were not performed.   Patient was noted to "shaking on his right side" per EEG annotation at 1038 which was difficult to be on camera.  Concomitant EEG before, during and after the event did not show any EEG change to suggest seizure. ABNORMALITY - Continuous slow, generalized IMPRESSION: This study is suggestive of moderate diffuse encephalopathy, nonspecific to etiology.  No seizures or epileptiform discharges were seen throughout the recording. Patient was noted to "shaking on his right side" at 1038 without concomitant EEG change. This was most likely not an epileptic event. Lora Havens   ECHOCARDIOGRAM LIMITED  Result Date: 05/17/2021    ECHOCARDIOGRAM LIMITED REPORT   Patient Name:   PRISCILLA FINKLEA Date of Exam: 05/17/2021 Medical Rec #:  [id number]         Height:       67.0 in Accession #:    [id number]        Weight:       187.4 lb Date of Birth:  [birth year]  BSA:          1.967 m Patient Age:     67 years          BP:           122/78 mmHg Patient Gender: M                 HR:           68 bpm. Exam Location:  Inpatient Procedure: Limited Echo, Limited Color Doppler and Cardiac Doppler Indications:    dyspnea. status post aortic valve replacement.  History:        Patient has prior history of Echocardiogram examinations. COPD                 and sepsis. Parkinson's; Risk Factors:Diabetes and Dyslipidemia.                 Aortic Valve: 21 mm bioprosthetic valve is present in the aortic                 position.  Sonographer:    Johny Chess RDCS Referring Phys: [id number] [place]  1. Left ventricular ejection fraction, by estimation, is 60 to 65%. The left ventricle has normal function. The left ventricle has no regional wall motion abnormalities. Left ventricular diastolic parameters are consistent with Grade I diastolic dysfunction (impaired relaxation).  2. Right ventricular systolic function is normal. The right ventricular size is normal.  3. The mitral valve is normal in structure. No evidence of mitral valve regurgitation. No evidence of mitral stenosis.  4. The aortic valve has been repaired/replaced. Aortic valve regurgitation is not visualized. No aortic stenosis is present. 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 Aortic valve mean gradient measures 10.0 mmHg. Aortic valve  Vmax measures 2.20 m/s.  5. The inferior vena cava is normal in size with greater than 50% respiratory variability, suggesting right atrial pressure of 3 mmHg. Comparison(s): No significant change from prior study. Prior images reviewed side by side. FINDINGS  Left Ventricle: Left ventricular ejection fraction, by estimation, is 60 to 65%. The left ventricle has normal function. The left ventricle has no regional wall motion abnormalities. The left ventricular internal cavity size was normal in size. There is  no left ventricular hypertrophy. Left ventricular diastolic  parameters are consistent with Grade I diastolic dysfunction (impaired relaxation). Right Ventricle: The right ventricular size is normal. No increase in right ventricular wall thickness. Right ventricular systolic function is normal. Left Atrium: Left atrial size was normal in size. Right Atrium: Right atrial size was normal in size. Pericardium: There is no evidence of pericardial effusion. Mitral Valve: The mitral valve is normal in structure. No evidence of mitral valve stenosis. Tricuspid Valve: The tricuspid valve is normal in structure. Tricuspid valve regurgitation is not demonstrated. No evidence of tricuspid stenosis. Aortic Valve: The aortic valve has been repaired/replaced. Aortic valve regurgitation is not visualized. No aortic stenosis is present. Aortic valve mean gradient measures 10.0 mmHg. Aortic valve peak gradient measures 19.4 mmHg. Aortic valve area, by VTI measures 0.84 cm. 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 Pulmonic Valve: The pulmonic valve was normal in structure. Pulmonic valve regurgitation is not visualized. No evidence of pulmonic stenosis. Aorta: The aortic root is normal in size and structure. Venous: The inferior vena cava is normal in size with greater than 50% respiratory variability, suggesting right atrial pressure of 3 mmHg. IAS/Shunts: No  atrial level shunt detected by color flow Doppler. LEFT VENTRICLE PLAX 2D LVIDd:         3.80 cm   Diastology LVIDs:         2.90 cm   LV e' medial:    5.87 cm/s LV PW:         1.00 cm   LV E/e' medial:  7.4 LV IVS:        1.00 cm   LV e' lateral:   9.25 cm/s LVOT diam:     1.70 cm   LV E/e' lateral: 4.7 LV SV:         34 LV SV Index:   17 LVOT Area:     2.27 cm  IVC IVC diam: 1.20 cm AORTIC VALVE AV Area (Vmax):    0.88 cm AV Area (Vmean):   0.84 cm AV Area (VTI):     0.84 cm AV Vmax:           220.00 cm/s AV Vmean:          148.000 cm/s AV VTI:            0.398 m AV Peak Grad:      19.4 mmHg AV Mean  Grad:      10.0 mmHg LVOT Vmax:         85.05 cm/s LVOT Vmean:        54.650 cm/s LVOT VTI:          0.148 m LVOT/AV VTI ratio: 0.37  AORTA Ao Root diam: 3.40 cm Ao Asc diam:  3.90 cm MITRAL VALVE MV Area (PHT): 1.98 cm    SHUNTS MV Decel Time: 384 msec    Systemic VTI:  0.15 m MV E velocity: 43.70 cm/s  Systemic Diam: 1.70 cm MV A velocity: 87.80 cm/s MV E/A ratio:  0.50 Candee Furbish MD Electronically signed by Candee Furbish MD Signature Date/Time: 05/17/2021/4:40:36 PM    Final         Scheduled Meds:  clotrimazole   Topical BID   enoxaparin (LOVENOX) injection  40 mg Subcutaneous Q24H   fluticasone furoate-vilanterol  1 puff Inhalation Daily   insulin aspart  0-15 Units Subcutaneous TID WC   insulin aspart  0-5 Units Subcutaneous QHS   metoprolol tartrate  2.5 mg Intravenous Q6H   Continuous Infusions:  dextrose 5 % and 0.9% NaCl 75 mL/hr at 05/18/21 1358   magnesium sulfate bolus IVPB 2 g (05/18/21 1357)   potassium chloride 10 mEq (05/18/21 1343)   thiamine injection Stopped (05/17/21 1852)     LOS: 5 days    Time spent: 35 minutes    Latrecia Capito A Liana Camerer, MD [facility]   If 7PM-7AM, please contact night-coverage [email or web address]  05/18/2021, 2:12 PM

## 2021-05-18 NOTE — Progress Notes (Signed)
Cardiology MD Allred updated on 17 beat run of VT and current NSB. Patient is arousable and at shift baseline confusion.

## 2021-05-18 NOTE — Progress Notes (Signed)
Patient is alert and not answering any orientation questions or following any commands. Responses are mostly, "quit. No. Stop. Hell no." Spouse at the bedside - patient asked who she was he stated " I dont know." MDs updated on MRI scheduling - new orders received.

## 2021-05-19 DIAGNOSIS — Z952 Presence of prosthetic heart valve: Secondary | ICD-10-CM | POA: Diagnosis not present

## 2021-05-19 DIAGNOSIS — A419 Sepsis, unspecified organism: Secondary | ICD-10-CM | POA: Diagnosis not present

## 2021-05-19 DIAGNOSIS — N179 Acute kidney failure, unspecified: Secondary | ICD-10-CM | POA: Diagnosis not present

## 2021-05-19 DIAGNOSIS — I471 Supraventricular tachycardia: Secondary | ICD-10-CM | POA: Diagnosis not present

## 2021-05-19 LAB — CBC
HCT: 46.5 % (ref 39.0–52.0)
Hemoglobin: 15.1 g/dL (ref 13.0–17.0)
MCH: 30.1 pg (ref 26.0–34.0)
MCHC: 32.5 g/dL (ref 30.0–36.0)
MCV: 92.6 fL (ref 80.0–100.0)
Platelets: 242 K/uL (ref 150–400)
RBC: 5.02 MIL/uL (ref 4.22–5.81)
RDW: 14.6 % (ref 11.5–15.5)
WBC: 7.6 K/uL (ref 4.0–10.5)
nRBC: 0 % (ref 0.0–0.2)

## 2021-05-19 LAB — BASIC METABOLIC PANEL WITH GFR
Anion gap: 8 (ref 5–15)
BUN: <5 mg/dL — ABNORMAL LOW (ref 8–23)
CO2: 27 mmol/L (ref 22–32)
Calcium: 8.7 mg/dL — ABNORMAL LOW (ref 8.9–10.3)
Chloride: 97 mmol/L — ABNORMAL LOW (ref 98–111)
Creatinine, Ser: 0.87 mg/dL (ref 0.61–1.24)
GFR, Estimated: >60 mL/min
Glucose, Bld: 103 mg/dL — ABNORMAL HIGH (ref 70–99)
Potassium: 3.5 mmol/L (ref 3.5–5.1)
Sodium: 132 mmol/L — ABNORMAL LOW (ref 135–145)

## 2021-05-19 LAB — GLUCOSE, CAPILLARY
Glucose-Capillary: 117 mg/dL — ABNORMAL HIGH (ref 70–99)
Glucose-Capillary: 139 mg/dL — ABNORMAL HIGH (ref 70–99)
Glucose-Capillary: 117 mg/dL — ABNORMAL HIGH (ref 70–99)
Glucose-Capillary: 125 mg/dL — ABNORMAL HIGH (ref 70–99)

## 2021-05-19 LAB — VITAMIN B1: Vitamin B1 (Thiamine): 126 nmol/L (ref 66.5–200.0)

## 2021-05-19 LAB — MAGNESIUM: Magnesium: 2 mg/dL (ref 1.7–2.4)

## 2021-05-19 MED ORDER — ATORVASTATIN CALCIUM 40 MG PO TABS
40.0000 mg | ORAL_TABLET | Freq: Every day | ORAL | Status: DC
  Administered 2021-05-19 – 2021-05-24 (×6): 40 mg via ORAL
  Filled 2021-05-19 (×7): qty 1

## 2021-05-19 NOTE — Progress Notes (Signed)
Patient is more interactive today. Oriented to person and place at present. Accepting small bites/sips, po medications and following simple commands.

## 2021-05-19 NOTE — Progress Notes (Signed)
PT Cancellation Note  Patient Details Name: Michael Hines MRN: [id number] DOB: 1946-05-15   Cancelled Treatment:    Reason Eval/Treat Not Completed: Other (comment) New order received 05/18/2021. Discussed with RN who reports there has not been a change in mental status and pt still unable to follow commands. She reports he is fatigued from lack of sleep last night. Will reassess tomorrow.  Wyona Almas, PT, DPT Acute Rehabilitation Services Pager [phone] Office [phone]    Deno Etienne 05/19/2021, 10:02 AM

## 2021-05-19 NOTE — Progress Notes (Addendum)
Progress Note  Patient Name: Michael Hines Date of Encounter: 05/19/2021  [facility] Cardiologist: None   Subjective   Remains confused, oriented to person only  Inpatient Medications    Scheduled Meds:  clotrimazole   Topical BID   enoxaparin (LOVENOX) injection  40 mg Subcutaneous Q24H   fluticasone furoate-vilanterol  1 puff Inhalation Daily   insulin aspart  0-15 Units Subcutaneous TID WC   insulin aspart  0-5 Units Subcutaneous QHS   metoprolol tartrate  2.5 mg Intravenous Q6H   nystatin  5 mL Oral QID   Continuous Infusions:  dextrose 5 % and 0.9% NaCl 75 mL/hr at 05/19/21 0553   thiamine injection Stopped (05/17/21 1852)   PRN Meds: acetaminophen **OR** acetaminophen, ipratropium-albuterol, ondansetron **OR** ondansetron (ZOFRAN) IV   Vital Signs    Vitals:   05/19/21 0548 05/19/21 0556 05/19/21 0751 05/19/21 0801  BP: (!) 149/96  (!) 155/90   Pulse:   68   Resp: (!) 21  20   Temp:   98.7 F (37.1 C)   TempSrc:   Oral   SpO2:   95% 95%  Weight:  85 kg    Height:        Intake/Output Summary (Last 24 hours) at 05/19/2021 1013 Last data filed at 05/19/2021 0556 Gross per 24 hour  Intake 1898.48 ml  Output 1900 ml  Net -1.52 ml    Last 3 Weights 05/19/2021 05/18/2021 05/17/2021  Weight (lbs) 187 lb 6.3 oz 186 lb 11.7 oz 187 lb 6.3 oz  Weight (kg) 85 kg 84.7 kg 85 kg      Telemetry    17 beat episode NSVT, normal sinus rhythm- personally Reviewed  ECG    No new ECG - Personally Reviewed  Physical Exam   GEN: Confused, oriented to person only Neck: No JVD Cardiac: RRR, 2/6 systolic murmur Respiratory: Clear to auscultation bilaterally GI: Soft, nontender, non-distended  MS: No edema Neuro: Oriented x1 Psych: Unable to assess  Labs    High Sensitivity Troponin:   Recent Labs  Lab 05/16/21 1639 05/16/21 1830  TROPONINIHS 24* 21*      Chemistry Recent Labs  Lab 05/13/21 1654 05/14/21 0630 05/15/21 1225 05/17/21 0726  05/18/21 1008 05/19/21 0131  NA 135 137   < > 130* 131* 132*  K 3.2* 3.7   < > 4.0 3.3* 3.5  CL 101 104   < > 97* 97* 97*  CO2 18* 21*   < > 23 25 27   GLUCOSE 132* 99   < > 108* 112* 103*  BUN 8 8   < > <5* <5* <5*  CREATININE 1.67* 1.13   < > 0.88 0.83 0.87  CALCIUM 8.8* 8.2*   < > 8.4* 8.4* 8.7*  MG  --   --    < > 1.8 1.5* 2.0  PROT 6.3* 5.2*  --   --   --   --   ALBUMIN 2.9* 2.5*  --   --   --   --   AST 21 17  --   --   --   --   ALT <5 5  --   --   --   --   ALKPHOS 118 80  --   --   --   --   BILITOT 1.3* 1.1  --   --   --   --   GFRNONAA 43* >60   < > >60 >60 >60  ANIONGAP 16* 12   < >  10 9 8    < > = values in this interval not displayed.     Lipids  Recent Labs  Lab 05/18/21 1903  CHOL 201*  TRIG 131  HDL 32*  LDLCALC 143*  CHOLHDL 6.3     Hematology Recent Labs  Lab 05/17/21 0726 05/18/21 1008 05/19/21 0131  WBC 10.7* 9.0 7.6  RBC 5.05 4.76 5.02  HGB 15.5 14.4 15.1  HCT 45.8 43.5 46.5  MCV 90.7 91.4 92.6  MCH 30.7 30.3 30.1  MCHC 33.8 33.1 32.5  RDW 14.3 14.3 14.6  PLT 235 217 242    Thyroid  Recent Labs  Lab 05/15/21 1227  TSH 1.957     BNPNo results for input(s): BNP, PROBNP in the last 168 hours.  DDimer  Recent Labs  Lab 05/13/21 2025  DDIMER 1.20*      Radiology    CT ANGIO HEAD W OR WO CONTRAST  Result Date: 05/18/2021 CLINICAL DATA:  Stroke/TIA, assess extracranial arteries; Stroke/TIA, assess intracranial arteries EXAM: CT ANGIOGRAPHY HEAD AND NECK TECHNIQUE: Multidetector CT imaging of the head and neck was performed using the standard protocol during bolus administration of intravenous contrast. Multiplanar CT image reconstructions and MIPs were obtained to evaluate the vascular anatomy. Carotid stenosis measurements (when applicable) are obtained utilizing NASCET criteria, using the distal internal carotid diameter as the denominator. CONTRAST:  70mL OMNIPAQUE IOHEXOL 350 MG/ML SOLN COMPARISON:  None. FINDINGS: CT HEAD  FINDINGS Brain: There is no mass, hemorrhage or extra-axial collection. There is generalized atrophy without lobar predilection. There is an old left cerebellar small vessel infarct. There is hypoattenuation of the periventricular white matter, most commonly indicating chronic ischemic microangiopathy. Skull: The visualized skull base, calvarium and extracranial soft tissues are normal. Sinuses/Orbits: No fluid levels or advanced mucosal thickening of the visualized paranasal sinuses. No mastoid or middle ear effusion. The orbits are normal. CTA NECK FINDINGS SKELETON: There is no bony spinal canal stenosis. No lytic or blastic lesion. OTHER NECK: Normal pharynx, larynx and major salivary glands. No cervical lymphadenopathy. Unremarkable thyroid gland. UPPER CHEST: No pneumothorax or pleural effusion. No nodules or masses. AORTIC ARCH: There is calcific atherosclerosis of the aortic arch. There is no aneurysm, dissection or hemodynamically significant stenosis of the visualized portion of the aorta. Conventional 3 vessel aortic branching pattern. The visualized proximal subclavian arteries are widely patent. RIGHT CAROTID SYSTEM: No dissection, occlusion or aneurysm. Mild atherosclerotic calcification at the carotid bifurcation without hemodynamically significant stenosis. LEFT CAROTID SYSTEM: No dissection, occlusion or aneurysm. Mild atherosclerotic calcification at the carotid bifurcation without hemodynamically significant stenosis. VERTEBRAL ARTERIES: Left dominant configuration. Both origins are clearly patent. There is no dissection, occlusion or flow-limiting stenosis to the skull base (V1-V3 segments). CTA HEAD FINDINGS CTA head images are degraded by motion. POSTERIOR CIRCULATION: --Vertebral arteries: Normal V4 segments. --Inferior cerebellar arteries: Normal. --Basilar artery: Normal. --Superior cerebellar arteries: Normal. --Posterior cerebral arteries (PCA): Normal. ANTERIOR CIRCULATION: --Intracranial  internal carotid arteries: Normal. --Anterior cerebral arteries (ACA): Normal. Both A1 segments are present. Patent anterior communicating artery (a-comm). --Middle cerebral arteries (MCA): Normal. VENOUS SINUSES: As permitted by contrast timing, patent. ANATOMIC VARIANTS: None Review of the MIP images confirms the above findings. IMPRESSION: 1. No emergent large vessel occlusion or high-grade stenosis of the head or neck. 2. 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 Electronically Signed   By: Ulyses Jarred M.D.   On: 05/18/2021 20:37   CT ANGIO NECK W OR WO CONTRAST  Result Date: 05/18/2021 CLINICAL DATA:  Stroke/TIA,  assess extracranial arteries; Stroke/TIA, assess intracranial arteries EXAM: CT ANGIOGRAPHY HEAD AND NECK TECHNIQUE: Multidetector CT imaging of the head and neck was performed using the standard protocol during bolus administration of intravenous contrast. Multiplanar CT image reconstructions and MIPs were obtained to evaluate the vascular anatomy. Carotid stenosis measurements (when applicable) are obtained utilizing NASCET criteria, using the distal internal carotid diameter as the denominator. CONTRAST:  11mL OMNIPAQUE IOHEXOL 350 MG/ML SOLN COMPARISON:  None. FINDINGS: CT HEAD FINDINGS Brain: There is no mass, hemorrhage or extra-axial collection. There is generalized atrophy without lobar predilection. There is an old left cerebellar small vessel infarct. There is hypoattenuation of the periventricular white matter, most commonly indicating chronic ischemic microangiopathy. Skull: The visualized skull base, calvarium and extracranial soft tissues are normal. Sinuses/Orbits: No fluid levels or advanced mucosal thickening of the visualized paranasal sinuses. No mastoid or middle ear effusion. The orbits are normal. CTA NECK FINDINGS SKELETON: There is no bony spinal canal stenosis. No lytic or blastic lesion. OTHER NECK: Normal pharynx, larynx and major salivary glands. No cervical  lymphadenopathy. Unremarkable thyroid gland. UPPER CHEST: No pneumothorax or pleural effusion. No nodules or masses. AORTIC ARCH: There is calcific atherosclerosis of the aortic arch. There is no aneurysm, dissection or hemodynamically significant stenosis of the visualized portion of the aorta. Conventional 3 vessel aortic branching pattern. The visualized proximal subclavian arteries are widely patent. RIGHT CAROTID SYSTEM: No dissection, occlusion or aneurysm. Mild atherosclerotic calcification at the carotid bifurcation without hemodynamically significant stenosis. LEFT CAROTID SYSTEM: No dissection, occlusion or aneurysm. Mild atherosclerotic calcification at the carotid bifurcation without hemodynamically significant stenosis. VERTEBRAL ARTERIES: Left dominant configuration. Both origins are clearly patent. There is no dissection, occlusion or flow-limiting stenosis to the skull base (V1-V3 segments). CTA HEAD FINDINGS CTA head images are degraded by motion. POSTERIOR CIRCULATION: --Vertebral arteries: Normal V4 segments. --Inferior cerebellar arteries: Normal. --Basilar artery: Normal. --Superior cerebellar arteries: Normal. --Posterior cerebral arteries (PCA): Normal. ANTERIOR CIRCULATION: --Intracranial internal carotid arteries: Normal. --Anterior cerebral arteries (ACA): Normal. Both A1 segments are present. Patent anterior communicating artery (a-comm). --Middle cerebral arteries (MCA): Normal. VENOUS SINUSES: As permitted by contrast timing, patent. ANATOMIC VARIANTS: None Review of the MIP images confirms the above findings. IMPRESSION: 1. No emergent large vessel occlusion or high-grade stenosis of the head or neck. 2. 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 Electronically Signed   By: Ulyses Jarred M.D.   On: 05/18/2021 20:37   CT Angio Chest Pulmonary Embolism (PE) W or WO Contrast  Result Date: 05/17/2021 CLINICAL DATA:  Shortness of breath EXAM: CT ANGIOGRAPHY CHEST WITH CONTRAST TECHNIQUE:  Multidetector CT imaging of the chest was performed using the standard protocol during bolus administration of intravenous contrast. Multiplanar CT image reconstructions and MIPs were obtained to evaluate the vascular anatomy. CONTRAST:  132mL OMNIPAQUE IOHEXOL 350 MG/ML SOLN COMPARISON:  04/12/2021 FINDINGS: Cardiovascular: Satisfactory opacification of the pulmonary arteries to the segmental level. No evidence of pulmonary embolism. Normal heart size. No pericardial effusion. Prior CABG. Ascending thoracic aortic aneurysm measuring 4.8 cm in transverse diameter at the level of the main pulmonary artery. Thoracic aortic atherosclerosis. Mediastinum/Nodes: No enlarged mediastinal, hilar, or axillary lymph nodes. Thyroid gland, trachea, and esophagus demonstrate no significant findings. Lungs/Pleura: No focal consolidation, pleural effusion or pneumothorax. Mild dependent atelectasis bilaterally. Upper Abdomen: No acute abnormality. Musculoskeletal: No chest wall abnormality. No acute or significant osseous findings. Review of the MIP images confirms the above findings. IMPRESSION: 1. No evidence of a pulmonary embolus. 2. Ascending thoracic  aortic aneurysm measuring 4.8 cm in transverse diameter at the level of the main pulmonary artery. Ascending thoracic aortic aneurysm. Recommend semi-annual imaging followup by CTA or MRA and referral to cardiothoracic surgery if not already obtained. This recommendation follows 2010 ACCF/AHA/AATS/ACR/ASA/SCA/SCAI/SIR/STS/SVM Guidelines for the Diagnosis and Management of Patients With Thoracic Aortic Disease. Circulation. 2010; 121: Z308-M578. Aortic aneurysm NOS (ICD10-I71.9) Electronically Signed   By: Kathreen Devoid M.D.   On: 05/17/2021 18:21   MR BRAIN WO CONTRAST  Result Date: 05/18/2021 CLINICAL DATA:  Neuro deficit, acute, stroke suspected. EXAM: MRI HEAD WITHOUT CONTRAST TECHNIQUE: Multiplanar, multiecho pulse sequences of the brain and surrounding structures were  obtained without intravenous contrast. COMPARISON:  Brain MRI 05/17/2021. Brain MRI 03/07/2020. Head CT 04/12/2021. FINDINGS: Brain: Intermittently motion degraded examination, limiting evaluation. Most notably, there is moderate to moderately severe motion degradation of the axial T2 FLAIR sequence, moderate motion degradation of the axial T1 weighted sequence and severe motion degradation of the coronal T2 TSE sequence. Redemonstrated central predominant cerebral atrophy. Mild cerebellar atrophy is also present. Apparent punctate focus of cortical diffusion weighted hyperintensity within the anterior right frontal lobe, appreciated on the axial diffusion-weighted sequence only (series 5, image 82). This may reflect a punctate acute infarct or artifact. Mild multifocal T2 FLAIR hyperintense signal abnormality within the cerebral white matter and pons, nonspecific but compatible with chronic small vessel ischemic disease. Chronic lacunar infarct within the inferior left cerebellar hemisphere. No evidence of an intracranial mass. No chronic intracranial blood products. No extra-axial fluid collection. No midline shift. Vascular: Maintained flow voids within the proximal large arterial vessels. Skull and upper cervical spine: No focal suspicious marrow lesion. Sinuses/Orbits: Visualized orbits show no acute finding. Right lens replacement. No significant paranasal sinus disease at the imaged levels. Other: Trace fluid within the bilateral mastoid air cells. IMPRESSION: Intermittently motion degraded examination, as described and limiting evaluation. Punctate acute cortical infarct versus artifact within the anterior right frontal lobe. Mild chronic small vessel ischemic changes within the cerebral white matter and pons, similar as compared to the brain MRI of 02/06/2020. 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 Central predominant cerebral atrophy. Mild cerebellar atrophy  is also present. Trace fluid within the bilateral mastoid air cells. Electronically Signed   By: Kellie Simmering D.O.   On: 05/18/2021 17:35   DG CHEST PORT 1 VIEW  Result Date: 05/17/2021 CLINICAL DATA:  Altered mental status, hypoxia EXAM: PORTABLE CHEST 1 VIEW COMPARISON:  05/13/2021 chest radiograph. FINDINGS: Intact sternotomy wires. Stable cardiomediastinal silhouette with mild cardiomegaly and aortic valve prosthesis in place. No pneumothorax. No pleural effusion. No rib pain edema. No acute consolidative airspace disease. IMPRESSION: Mild cardiomegaly without overt pulmonary edema. No active pulmonary disease. Electronically Signed   By: Ilona Sorrel M.D.   On: 05/17/2021 16:23   ECHOCARDIOGRAM LIMITED  Result Date: 05/17/2021    ECHOCARDIOGRAM LIMITED REPORT   Patient Name:   KHIREE BUKHARI Date of Exam: 05/17/2021 Medical Rec #:  [id number]         Height:       67.0 in Accession #:    [id number]        Weight:       187.4 lb Date of Birth:  21-Nov-1945         BSA:          1.967 m Patient Age:    55 years          BP:  122/78 mmHg Patient Gender: M                 HR:           68 bpm. Exam Location:  Inpatient Procedure: Limited Echo, Limited Color Doppler and Cardiac Doppler Indications:    dyspnea. status post aortic valve replacement.  History:        Patient has prior history of Echocardiogram examinations. COPD                 and sepsis. Parkinson's; Risk Factors:Diabetes and Dyslipidemia.                 Aortic Valve: 21 mm bioprosthetic valve is present in the aortic                 position.  Sonographer:    Johny Chess RDCS Referring Phys: [id number] [place]  1. Left ventricular ejection fraction, by estimation, is 60 to 65%. The left ventricle has normal function. The left ventricle has no regional wall motion abnormalities. Left ventricular diastolic parameters are consistent with Grade I diastolic dysfunction (impaired relaxation).  2. Right  ventricular systolic function is normal. The right ventricular size is normal.  3. The mitral valve is normal in structure. No evidence of mitral valve regurgitation. No evidence of mitral stenosis.  4. The aortic valve has been repaired/replaced. Aortic valve regurgitation is not visualized. No aortic stenosis is present. 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 Aortic valve mean gradient measures 10.0 mmHg. Aortic valve  Vmax measures 2.20 m/s.  5. The inferior vena cava is normal in size with greater than 50% respiratory variability, suggesting right atrial pressure of 3 mmHg. Comparison(s): No significant change from prior study. Prior images reviewed side by side. FINDINGS  Left Ventricle: Left ventricular ejection fraction, by estimation, is 60 to 65%. The left ventricle has normal function. The left ventricle has no regional wall motion abnormalities. The left ventricular internal cavity size was normal in size. There is  no left ventricular hypertrophy. Left ventricular diastolic parameters are consistent with Grade I diastolic dysfunction (impaired relaxation). Right Ventricle: The right ventricular size is normal. No increase in right ventricular wall thickness. Right ventricular systolic function is normal. Left Atrium: Left atrial size was normal in size. Right Atrium: Right atrial size was normal in size. Pericardium: There is no evidence of pericardial effusion. Mitral Valve: The mitral valve is normal in structure. No evidence of mitral valve stenosis. Tricuspid Valve: The tricuspid valve is normal in structure. Tricuspid valve regurgitation is not demonstrated. No evidence of tricuspid stenosis. Aortic Valve: The aortic valve has been repaired/replaced. Aortic valve regurgitation is not visualized. No aortic stenosis is present. Aortic valve mean gradient measures 10.0 mmHg. Aortic valve peak gradient measures 19.4 mmHg. Aortic valve area, by VTI measures 0.84 cm. There  is a 21 mm bioprosthetic valve present in the aortic position. Pulmonic Valve: The pulmonic valve was normal in structure. Pulmonic valve regurgitation is not visualized. No evidence of pulmonic stenosis. Aorta: The aortic root is normal in size and structure. Venous: The inferior vena cava is normal in size with greater than 50% respiratory variability, suggesting right atrial pressure of 3 mmHg. IAS/Shunts: No atrial level shunt detected by color flow Doppler. LEFT VENTRICLE PLAX 2D LVIDd:         3.80 cm   Diastology LVIDs:         2.90 cm  LV e' medial:    5.87 cm/s LV PW:         1.00 cm   LV E/e' medial:  7.4 LV IVS:        1.00 cm   LV e' lateral:   9.25 cm/s LVOT diam:     1.70 cm   LV E/e' lateral: 4.7 LV SV:         34 LV SV Index:   17 LVOT Area:     2.27 cm  IVC IVC diam: 1.20 cm AORTIC VALVE AV Area (Vmax):    0.88 cm AV Area (Vmean):   0.84 cm AV Area (VTI):     0.84 cm AV Vmax:           220.00 cm/s AV Vmean:          148.000 cm/s AV VTI:            0.398 m AV Peak Grad:      19.4 mmHg AV Mean Grad:      10.0 mmHg LVOT Vmax:         85.05 cm/s LVOT Vmean:        54.650 cm/s LVOT VTI:          0.148 m LVOT/AV VTI ratio: 0.37  AORTA Ao Root diam: 3.40 cm Ao Asc diam:  3.90 cm MITRAL VALVE MV Area (PHT): 1.98 cm    SHUNTS MV Decel Time: 384 msec    Systemic VTI:  0.15 m MV E velocity: 43.70 cm/s  Systemic Diam: 1.70 cm MV A velocity: 87.80 cm/s MV E/A ratio:  0.50 Candee Furbish MD Electronically signed by Candee Furbish MD Signature Date/Time: 05/17/2021/4:40:36 PM    Final     Cardiac Studies     Patient Profile     75 y.o. male with a hx of COPD, T2DM, hyperlipidemia, bioprosthetic aortic valve, recent MSSA bacteremia who is being seen  for the evaluation of tachycardia    Assessment & Plan    SVT: episodes regular narrow complex tachycardia lasting up to 15 minutes, rate 190.  Consistent with SVT.  -Started PO metoprolol 25 mg twice daily but confused and refusing any oral medications.   Switched to IV metoprolol 2.5 mg every 6 hours; SVT appears improved, would continue scheduled IV metoprolol and can transition to toprol XL 25 mg daily once able to take PO -Maintain K>4, Mag>2   NSVT: Maintain K greater than 4, mag greater than 2  Status post AVR:  recent MSSA bacteremia.  TEE on 04/18/2021 showed 21 mm bioprosthetic aortic valve (V-max 2.6 m/s, mean gradient 15 mmHg, EOA 1.7 cm, DI 0.54), mild central AI, no definitive evidence of endocarditis, normal biventricular function, mild MR, ascending aortic aneurysm measuring 45 mm. - Echo 10/7 shows no significant changes: normal biventricular function, bioprosthetic AV with MG 10 mmHg  [facility] will sign off.   Medication Recommendations:  Continue IV metoprolol 2.5 mg q6 hours while NPO, would transition to PO toprol XL 25 mg daily once taking PO Other recommendations (labs, testing, etc):  None Follow up as an outpatient:  Recommend scheduling with his cardiologist (follows with [facility] cardiology in [place])   For questions or updates, please contact [facility] Please consult [email or web address] for contact info under        Signed, Donato Heinz, MD  05/19/2021, 10:13 AM

## 2021-05-19 NOTE — Progress Notes (Signed)
PROGRESS NOTE    Michael Hines  WVP:[id number] DOB: 08/18/1945 DOA: 05/13/2021 PCP: Finis Bud, MD   Brief Narrative: 75 year old with past medical history significant for COPD, type 2 diabetes, hyperlipidemia, bioprosthetic aortic valve replacement and recent MSSA septicemia status post 4 weeks of IV Ancef which he will have finished 10/3 presented to the ID clinic for follow-up and found to be more confused than normal.  He is was diaphoretic and heart rate was in the 130s, temperature was 92.  They were not able to check his blood pressure.  He was transferred to the ED for further evaluation.  Patient was at a skilled nursing facility finishing up his IV antibiotic therapy.    Assessment & Plan:   Principal Problem:   Sepsis with acute organ dysfunction (HCC) Active Problems:   Diabetes mellitus without complication ([place])   S/P AVR   Diabetic polyneuropathy associated with type 2 diabetes mellitus (HCC)   Chronic bilateral low back pain without sciatica   Hypotension   Hypothermia   AKI (acute kidney injury) ([place])   Sepsis with acute organ dysfunction without septic shock ([place])  SIRS;  Patient presents with confusion, hypothermia, hypotension. Not Clear source of infection. Sepsis was ruled out.  Blood cultures no growth .  Chest x-ray no evidence for infection. Urine culture no growth to date.  ID was consulted due to recent MSSA bacteremia.  Patient was a started on cefepime and vancomycin.  Subsequently vancomycin was discontinued.  Cefepime was discontinued 10/5.   Acute metabolic encephalopathy: Delirium, in the setting of recent infection, dehydration. Question Cefepime .  B12 984, thiamine level pending, started IV thiamine. Ammonia level 21 normal, B12 984 normal.  TSH 1.9 normal EEG; Moderate to diffuse encephalopathy.  MRI repeated old stroke, artifact.  ABG hypoxemia; Placed on oxygen.  Patient has become more alert, but continue to be confuse and  poor oral intake.    Hypoxemia; suspect Sleep apnea, hypoventilation.  CT angio negative for PE>  Continue  on 3 L oxygen.   Hyponatremia , hypokalemia, Hypomagnesemia;  Replaced.   SVT:  Patient had HR up 190-200.  Appreciate cardiology eval.  Plan for IV metoprolol, patient not taking oral meds.  ECHO : normal EF  Nutrition;  He has not been eating, refuse to eat.  Family will discussed about tub feeding. Will consult palliative care to assist,   Oral thrush:  Started Nystatin.   Type 2 diabetes: Continue with a sliding scale insulin.   Parkinsonism:  Vascular parkinsonism; I discussed with Dr Martie Round, patient's primary neurologist about holding Carbidopa-levodopa. He said we can hold for now, and he can reassess out patient needs for this medications.   Radiculopathy: Plan to hold gabapentin due to AMS.    Elevated D-dimer: Doppler lower extremity negative for DVT.    Estimated body mass index is 29.35 kg/m as calculated from the following:   Height as of this encounter: 5\' 7"  (1.702 m).   Weight as of this encounter: 85 kg.   DVT prophylaxis: Lovenox Code Status: Full code Family Communication: Wife and two son at bedside Disposition Plan:  Status is: Inpatient  Remains inpatient appropriate because:IV treatments appropriate due to intensity of illness or inability to take PO  Dispo: The patient is from: SNF              Anticipated d/c is to: SNF              Patient currently is not  medically stable to d/c.   Difficult to place patient No        Consultants:  ID  Procedures:    Antimicrobials:    Subjective: He is alert, confuse. He recognized his sons.  He ate 3 bites of eggs.   Objective: Vitals:   05/19/21 0556 05/19/21 0751 05/19/21 0801 05/19/21 1212  BP:  (!) 155/90  (!) 104/91  Pulse:  68  89  Resp:  20  18  Temp:  98.7 F (37.1 C)  97.7 F (36.5 C)  TempSrc:  Oral  Oral  SpO2:  95% 95% 95%  Weight: 85 kg     Height:         Intake/Output Summary (Last 24 hours) at 05/19/2021 1522 Last data filed at 05/19/2021 1300 Gross per 24 hour  Intake 2058.48 ml  Output 2350 ml  Net -291.52 ml    Filed Weights   05/17/21 0459 05/18/21 0419 05/19/21 0556  Weight: 85 kg 84.7 kg 85 kg    Examination:  General exam: Chronic ill appearing Respiratory system: CTA Cardiovascular system: S 1 S 2 RRR Gastrointestinal system: BS present, soft, nt Central nervous system: alert, confuse Extremities: no edema   Data Reviewed: I have personally reviewed following labs and imaging studies  CBC: Recent Labs  Lab 05/13/21 1654 05/14/21 0630 05/15/21 1225 05/17/21 0726 05/18/21 1008 05/19/21 0131  WBC 11.3* 9.4 9.7 10.7* 9.0 7.6  NEUTROABS 9.4* 6.4  --   --   --   --   HGB 16.3 13.7 13.6 15.5 14.4 15.1  HCT 51.0 42.2 41.2 45.8 43.5 46.5  MCV 95.9 94.0 93.2 90.7 91.4 92.6  PLT 244 202 196 235 217 696    Basic Metabolic Panel: Recent Labs  Lab 05/16/21 0155 05/16/21 1639 05/17/21 0726 05/18/21 1008 05/19/21 0131  NA 133* 132* 130* 131* 132*  K 3.3* 3.6 4.0 3.3* 3.5  CL 98 95* 97* 97* 97*  CO2 21* 26 23 25 27   GLUCOSE 68* 199* 108* 112* 103*  BUN <5* <5* <5* <5* <5*  CREATININE 0.74 0.91 0.88 0.83 0.87  CALCIUM 8.2* 8.4* 8.4* 8.4* 8.7*  MG 1.1* 1.9 1.8 1.5* 2.0    GFR: Estimated Creatinine Clearance: 78.8 mL/min (by C-G formula based on SCr of 0.87 mg/dL). Liver Function Tests: Recent Labs  Lab 05/13/21 1654 05/14/21 0630  AST 21 17  ALT <5 5  ALKPHOS 118 80  BILITOT 1.3* 1.1  PROT 6.3* 5.2*  ALBUMIN 2.9* 2.5*    No results for input(s): LIPASE, AMYLASE in the last 168 hours. Recent Labs  Lab 05/15/21 1315  AMMONIA 21    Coagulation Profile: No results for input(s): INR, PROTIME in the last 168 hours. Cardiac Enzymes: No results for input(s): CKTOTAL, CKMB, CKMBINDEX, TROPONINI in the last 168 hours. BNP (last 3 results) No results for input(s): PROBNP in the last 8760  hours. HbA1C: No results for input(s): HGBA1C in the last 72 hours. CBG: Recent Labs  Lab 05/18/21 1229 05/18/21 1811 05/18/21 2135 05/19/21 0756 05/19/21 1217  GLUCAP 121* 84 98 117* 139*    Lipid Profile: Recent Labs    05/18/21 1903  CHOL 201*  HDL 32*  LDLCALC 143*  TRIG 131  CHOLHDL 6.3   Thyroid Function Tests: No results for input(s): TSH, T4TOTAL, FREET4, T3FREE, THYROIDAB in the last 72 hours.  Anemia Panel: No results for input(s): VITAMINB12, FOLATE, FERRITIN, TIBC, IRON, RETICCTPCT in the last 72 hours.  Sepsis Labs: Recent Labs  Lab 05/13/21 1713 05/13/21 2025 05/14/21 0000 05/14/21 0630 05/15/21 1228  PROCALCITON  --   --  0.11 0.21 <0.10  LATICACIDVEN 2.0* 1.4  --   --   --      Recent Results (from the past 240 hour(s))  Blood Culture (routine x 2)     Status: None   Collection Time: 05/13/21  5:13 PM   Specimen: BLOOD  Result Value Ref Range Status   Specimen Description BLOOD LEFT ANTECUBITAL  Final   Special Requests   Final    BOTTLES DRAWN AEROBIC AND ANAEROBIC Blood Culture results may not be optimal due to an inadequate volume of blood received in culture bottles   Culture   Final    NO GROWTH 5 DAYS Performed at [facility], [place] [address], [place], [place] [address]    Report Status 05/18/2021 FINAL  Final  Resp Panel by RT-PCR (Flu A&B, Covid) Nasopharyngeal Swab     Status: None   Collection Time: 05/13/21  5:22 PM   Specimen: Nasopharyngeal Swab; Nasopharyngeal(NP) swabs in vial transport medium  Result Value Ref Range Status   SARS Coronavirus 2 by RT PCR NEGATIVE NEGATIVE Final    Comment: (NOTE) SARS-CoV-2 target nucleic acids are NOT DETECTED.  The SARS-CoV-2 RNA is generally detectable in upper respiratory specimens during the acute phase of infection. The lowest concentration of SARS-CoV-2 viral copies this assay can detect is 138 copies/mL. A negative result does not preclude SARS-Cov-2 infection and should  not be used as the sole basis for treatment or other patient management decisions. A negative result may occur with  improper specimen collection/handling, submission of specimen other than nasopharyngeal swab, presence of viral mutation(s) within the areas targeted by this assay, and inadequate number of viral copies(<138 copies/mL). A negative result must be combined with clinical observations, patient history, and epidemiological information. The expected result is Negative.  Fact Sheet for Patients:  EntrepreneurPulse.com.au  Fact Sheet for Healthcare Providers:  IncredibleEmployment.be  This test is no t yet approved or cleared by the [place] FDA and  has been authorized for detection and/or diagnosis of SARS-CoV-2 by FDA under an Emergency Use Authorization (EUA). This EUA will remain  in effect (meaning this test can be used) for the duration of the COVID-19 declaration under Section 564(b)(1) of the Act, 21 U.S.C.section 360bbb-3(b)(1), unless the authorization is terminated  or revoked sooner.       Influenza A by PCR NEGATIVE NEGATIVE Final   Influenza B by PCR NEGATIVE NEGATIVE Final    Comment: (NOTE) The Xpert Xpress SARS-CoV-2/FLU/RSV plus assay is intended as an aid in the diagnosis of influenza from Nasopharyngeal swab specimens and should not be used as a sole basis for treatment. Nasal washings and aspirates are unacceptable for Xpert Xpress SARS-CoV-2/FLU/RSV testing.  Fact Sheet for Patients: EntrepreneurPulse.com.au  Fact Sheet for Healthcare Providers: IncredibleEmployment.be  This test is not yet approved or cleared by the [place] FDA and has been authorized for detection and/or diagnosis of SARS-CoV-2 by FDA under an Emergency Use Authorization (EUA). This EUA will remain in effect (meaning this test can be used) for the duration of the COVID-19 declaration under  Section 564(b)(1) of the Act, 21 U.S.C. section 360bbb-3(b)(1), unless the authorization is terminated or revoked.  Performed at [facility], [place] [address], [place], [place] [address]   Blood Culture (routine x 2)     Status: None   Collection Time: 05/13/21  5:39 PM  Specimen: BLOOD  Result Value Ref Range Status   Specimen Description BLOOD RIGHT ANTECUBITAL  Final   Special Requests   Final    BOTTLES DRAWN AEROBIC AND ANAEROBIC Blood Culture results may not be optimal due to an inadequate volume of blood received in culture bottles   Culture   Final    NO GROWTH 5 DAYS Performed at [facility], [place] [address] [place], [place] [address]    Report Status 05/18/2021 FINAL  Final  Urine Culture     Status: None   Collection Time: 05/15/21  1:16 PM   Specimen: Urine, Clean Catch  Result Value Ref Range Status   Specimen Description URINE, CLEAN CATCH  Final   Special Requests NONE  Final   Culture   Final    NO GROWTH Performed at [facility], [place] [address] [place], [place] [address]    Report Status 05/16/2021 FINAL  Final  MRSA Next Gen by PCR, Nasal     Status: None   Collection Time: 05/15/21  3:54 PM   Specimen: Nasal Mucosa; Nasal Swab  Result Value Ref Range Status   MRSA by PCR Next Gen NOT DETECTED NOT DETECTED Final    Comment: (NOTE) The GeneXpert MRSA Assay (FDA approved for NASAL specimens only), is one component of a comprehensive MRSA colonization surveillance program. It is not intended to diagnose MRSA infection nor to guide or monitor treatment for MRSA infections. Test performance is not FDA approved in patients less than 63 years old. Performed at [facility], [place] [address] [place], [place] [address]           Radiology Studies: CT ANGIO HEAD W OR WO CONTRAST  Result Date: 05/18/2021 CLINICAL DATA:  Stroke/TIA, assess extracranial arteries; Stroke/TIA, assess intracranial arteries EXAM: CT ANGIOGRAPHY HEAD AND  NECK TECHNIQUE: Multidetector CT imaging of the head and neck was performed using the standard protocol during bolus administration of intravenous contrast. Multiplanar CT image reconstructions and MIPs were obtained to evaluate the vascular anatomy. Carotid stenosis measurements (when applicable) are obtained utilizing NASCET criteria, using the distal internal carotid diameter as the denominator. CONTRAST:  39mL OMNIPAQUE IOHEXOL 350 MG/ML SOLN COMPARISON:  None. FINDINGS: CT HEAD FINDINGS Brain: There is no mass, hemorrhage or extra-axial collection. There is generalized atrophy without lobar predilection. There is an old left cerebellar small vessel infarct. There is hypoattenuation of the periventricular white matter, most commonly indicating chronic ischemic microangiopathy. Skull: The visualized skull base, calvarium and extracranial soft tissues are normal. Sinuses/Orbits: No fluid levels or advanced mucosal thickening of the visualized paranasal sinuses. No mastoid or middle ear effusion. The orbits are normal. CTA NECK FINDINGS SKELETON: There is no bony spinal canal stenosis. No lytic or blastic lesion. OTHER NECK: Normal pharynx, larynx and major salivary glands. No cervical lymphadenopathy. Unremarkable thyroid gland. UPPER CHEST: No pneumothorax or pleural effusion. No nodules or masses. AORTIC ARCH: There is calcific atherosclerosis of the aortic arch. There is no aneurysm, dissection or hemodynamically significant stenosis of the visualized portion of the aorta. Conventional 3 vessel aortic branching pattern. The visualized proximal subclavian arteries are widely patent. RIGHT CAROTID SYSTEM: No dissection, occlusion or aneurysm. Mild atherosclerotic calcification at the carotid bifurcation without hemodynamically significant stenosis. LEFT CAROTID SYSTEM: No dissection, occlusion or aneurysm. Mild atherosclerotic calcification at the carotid bifurcation without hemodynamically significant stenosis.  VERTEBRAL ARTERIES: Left dominant configuration. Both origins are clearly patent. There is no dissection, occlusion or flow-limiting stenosis to  the skull base (V1-V3 segments). CTA HEAD FINDINGS CTA head images are degraded by motion. POSTERIOR CIRCULATION: --Vertebral arteries: Normal V4 segments. --Inferior cerebellar arteries: Normal. --Basilar artery: Normal. --Superior cerebellar arteries: Normal. --Posterior cerebral arteries (PCA): Normal. ANTERIOR CIRCULATION: --Intracranial internal carotid arteries: Normal. --Anterior cerebral arteries (ACA): Normal. Both A1 segments are present. Patent anterior communicating artery (a-comm). --Middle cerebral arteries (MCA): Normal. VENOUS SINUSES: As permitted by contrast timing, patent. ANATOMIC VARIANTS: None Review of the MIP images confirms the above findings. IMPRESSION: 1. No emergent large vessel occlusion or high-grade stenosis of the head or neck. 2. 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 Electronically Signed   By: Ulyses Jarred M.D.   On: 05/18/2021 20:37   CT ANGIO NECK W OR WO CONTRAST  Result Date: 05/18/2021 CLINICAL DATA:  Stroke/TIA, assess extracranial arteries; Stroke/TIA, assess intracranial arteries EXAM: CT ANGIOGRAPHY HEAD AND NECK TECHNIQUE: Multidetector CT imaging of the head and neck was performed using the standard protocol during bolus administration of intravenous contrast. Multiplanar CT image reconstructions and MIPs were obtained to evaluate the vascular anatomy. Carotid stenosis measurements (when applicable) are obtained utilizing NASCET criteria, using the distal internal carotid diameter as the denominator. CONTRAST:  62mL OMNIPAQUE IOHEXOL 350 MG/ML SOLN COMPARISON:  None. FINDINGS: CT HEAD FINDINGS Brain: There is no mass, hemorrhage or extra-axial collection. There is generalized atrophy without lobar predilection. There is an old left cerebellar small vessel infarct. There is hypoattenuation of the periventricular white  matter, most commonly indicating chronic ischemic microangiopathy. Skull: The visualized skull base, calvarium and extracranial soft tissues are normal. Sinuses/Orbits: No fluid levels or advanced mucosal thickening of the visualized paranasal sinuses. No mastoid or middle ear effusion. The orbits are normal. CTA NECK FINDINGS SKELETON: There is no bony spinal canal stenosis. No lytic or blastic lesion. OTHER NECK: Normal pharynx, larynx and major salivary glands. No cervical lymphadenopathy. Unremarkable thyroid gland. UPPER CHEST: No pneumothorax or pleural effusion. No nodules or masses. AORTIC ARCH: There is calcific atherosclerosis of the aortic arch. There is no aneurysm, dissection or hemodynamically significant stenosis of the visualized portion of the aorta. Conventional 3 vessel aortic branching pattern. The visualized proximal subclavian arteries are widely patent. RIGHT CAROTID SYSTEM: No dissection, occlusion or aneurysm. Mild atherosclerotic calcification at the carotid bifurcation without hemodynamically significant stenosis. LEFT CAROTID SYSTEM: No dissection, occlusion or aneurysm. Mild atherosclerotic calcification at the carotid bifurcation without hemodynamically significant stenosis. VERTEBRAL ARTERIES: Left dominant configuration. Both origins are clearly patent. There is no dissection, occlusion or flow-limiting stenosis to the skull base (V1-V3 segments). CTA HEAD FINDINGS CTA head images are degraded by motion. POSTERIOR CIRCULATION: --Vertebral arteries: Normal V4 segments. --Inferior cerebellar arteries: Normal. --Basilar artery: Normal. --Superior cerebellar arteries: Normal. --Posterior cerebral arteries (PCA): Normal. ANTERIOR CIRCULATION: --Intracranial internal carotid arteries: Normal. --Anterior cerebral arteries (ACA): Normal. Both A1 segments are present. Patent anterior communicating artery (a-comm). --Middle cerebral arteries (MCA): Normal. VENOUS SINUSES: As permitted by  contrast timing, patent. ANATOMIC VARIANTS: None Review of the MIP images confirms the above findings. IMPRESSION: 1. No emergent large vessel occlusion or high-grade stenosis of the head or neck. 2. 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 Electronically Signed   By: Ulyses Jarred M.D.   On: 05/18/2021 20:37   CT Angio Chest Pulmonary Embolism (PE) W or WO Contrast  Result Date: 05/17/2021 CLINICAL DATA:  Shortness of breath EXAM: CT ANGIOGRAPHY CHEST WITH CONTRAST TECHNIQUE: Multidetector CT imaging of the chest was performed using the standard protocol during bolus administration of  intravenous contrast. Multiplanar CT image reconstructions and MIPs were obtained to evaluate the vascular anatomy. CONTRAST:  133mL OMNIPAQUE IOHEXOL 350 MG/ML SOLN COMPARISON:  04/12/2021 FINDINGS: Cardiovascular: Satisfactory opacification of the pulmonary arteries to the segmental level. No evidence of pulmonary embolism. Normal heart size. No pericardial effusion. Prior CABG. Ascending thoracic aortic aneurysm measuring 4.8 cm in transverse diameter at the level of the main pulmonary artery. Thoracic aortic atherosclerosis. Mediastinum/Nodes: No enlarged mediastinal, hilar, or axillary lymph nodes. Thyroid gland, trachea, and esophagus demonstrate no significant findings. Lungs/Pleura: No focal consolidation, pleural effusion or pneumothorax. Mild dependent atelectasis bilaterally. Upper Abdomen: No acute abnormality. Musculoskeletal: No chest wall abnormality. No acute or significant osseous findings. Review of the MIP images confirms the above findings. IMPRESSION: 1. No evidence of a pulmonary embolus. 2. Ascending thoracic aortic aneurysm measuring 4.8 cm in transverse diameter at the level of the main pulmonary artery. Ascending thoracic aortic aneurysm. Recommend semi-annual imaging followup by CTA or MRA and referral to cardiothoracic surgery if not already obtained. This recommendation follows 2010  ACCF/AHA/AATS/ACR/ASA/SCA/SCAI/SIR/STS/SVM Guidelines for the Diagnosis and Management of Patients With Thoracic Aortic Disease. Circulation. 2010; 121: G254-Y706. Aortic aneurysm NOS (ICD10-I71.9) Electronically Signed   By: Kathreen Devoid M.D.   On: 05/17/2021 18:21   MR BRAIN WO CONTRAST  Result Date: 05/18/2021 CLINICAL DATA:  Neuro deficit, acute, stroke suspected. EXAM: MRI HEAD WITHOUT CONTRAST TECHNIQUE: Multiplanar, multiecho pulse sequences of the brain and surrounding structures were obtained without intravenous contrast. COMPARISON:  Brain MRI 05/17/2021. Brain MRI 03/07/2020. Head CT 04/12/2021. FINDINGS: Brain: Intermittently motion degraded examination, limiting evaluation. Most notably, there is moderate to moderately severe motion degradation of the axial T2 FLAIR sequence, moderate motion degradation of the axial T1 weighted sequence and severe motion degradation of the coronal T2 TSE sequence. Redemonstrated central predominant cerebral atrophy. Mild cerebellar atrophy is also present. Apparent punctate focus of cortical diffusion weighted hyperintensity within the anterior right frontal lobe, appreciated on the axial diffusion-weighted sequence only (series 5, image 82). This may reflect a punctate acute infarct or artifact. Mild multifocal T2 FLAIR hyperintense signal abnormality within the cerebral white matter and pons, nonspecific but compatible with chronic small vessel ischemic disease. Chronic lacunar infarct within the inferior left cerebellar hemisphere. No evidence of an intracranial mass. No chronic intracranial blood products. No extra-axial fluid collection. No midline shift. Vascular: Maintained flow voids within the proximal large arterial vessels. Skull and upper cervical spine: No focal suspicious marrow lesion. Sinuses/Orbits: Visualized orbits show no acute finding. Right lens replacement. No significant paranasal sinus disease at the imaged levels. Other: Trace fluid within  the bilateral mastoid air cells. IMPRESSION: Intermittently motion degraded examination, as described and limiting evaluation. Punctate acute cortical infarct versus artifact within the anterior right frontal lobe. Mild chronic small vessel ischemic changes within the cerebral white matter and pons, similar as compared to the brain MRI of 02/06/2020. 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 Central predominant cerebral atrophy. Mild cerebellar atrophy is also present. Trace fluid within the bilateral mastoid air cells. Electronically Signed   By: Kellie Simmering D.O.   On: 05/18/2021 17:35   DG CHEST PORT 1 VIEW  Result Date: 05/17/2021 CLINICAL DATA:  Altered mental status, hypoxia EXAM: PORTABLE CHEST 1 VIEW COMPARISON:  05/13/2021 chest radiograph. FINDINGS: Intact sternotomy wires. Stable cardiomediastinal silhouette with mild cardiomegaly and aortic valve prosthesis in place. No pneumothorax. No pleural effusion. No rib pain edema. No acute consolidative airspace disease. IMPRESSION: Mild cardiomegaly without  overt pulmonary edema. No active pulmonary disease. Electronically Signed   By: Ilona Sorrel M.D.   On: 05/17/2021 16:23   ECHOCARDIOGRAM LIMITED  Result Date: 05/17/2021    ECHOCARDIOGRAM LIMITED REPORT   Patient Name:   Michael Hines Date of Exam: 05/17/2021 Medical Rec #:  [id number]         Height:       67.0 in Accession #:    [id number]        Weight:       187.4 lb Date of Birth:  [birth year]         BSA:          1.967 m Patient Age:    84 years          BP:           122/78 mmHg Patient Gender: M                 HR:           68 bpm. Exam Location:  Inpatient Procedure: Limited Echo, Limited Color Doppler and Cardiac Doppler Indications:    dyspnea. status post aortic valve replacement.  History:        Patient has prior history of Echocardiogram examinations. COPD                 and sepsis. Parkinson's; Risk Factors:Diabetes and Dyslipidemia.                  Aortic Valve: 21 mm bioprosthetic valve is present in the aortic                 position.  Sonographer:    Johny Chess RDCS Referring Phys: [id number] [place]  1. Left ventricular ejection fraction, by estimation, is 60 to 65%. The left ventricle has normal function. The left ventricle has no regional wall motion abnormalities. Left ventricular diastolic parameters are consistent with Grade I diastolic dysfunction (impaired relaxation).  2. Right ventricular systolic function is normal. The right ventricular size is normal.  3. The mitral valve is normal in structure. No evidence of mitral valve regurgitation. No evidence of mitral stenosis.  4. The aortic valve has been repaired/replaced. Aortic valve regurgitation is not visualized. No aortic stenosis is present. 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 Aortic valve mean gradient measures 10.0 mmHg. Aortic valve  Vmax measures 2.20 m/s.  5. The inferior vena cava is normal in size with greater than 50% respiratory variability, suggesting right atrial pressure of 3 mmHg. Comparison(s): No significant change from prior study. Prior images reviewed side by side. FINDINGS  Left Ventricle: Left ventricular ejection fraction, by estimation, is 60 to 65%. The left ventricle has normal function. The left ventricle has no regional wall motion abnormalities. The left ventricular internal cavity size was normal in size. There is  no left ventricular hypertrophy. Left ventricular diastolic parameters are consistent with Grade I diastolic dysfunction (impaired relaxation). Right Ventricle: The right ventricular size is normal. No increase in right ventricular wall thickness. Right ventricular systolic function is normal. Left Atrium: Left atrial size was normal in size. Right Atrium: Right atrial size was normal in size. Pericardium: There is no evidence of pericardial effusion. Mitral Valve: The mitral  valve is normal in structure. No evidence of mitral valve stenosis. Tricuspid Valve: The tricuspid valve is normal in structure. Tricuspid valve regurgitation is not demonstrated. No evidence of tricuspid stenosis. Aortic Valve:  The aortic valve has been repaired/replaced. Aortic valve regurgitation is not visualized. No aortic stenosis is present. Aortic valve mean gradient measures 10.0 mmHg. Aortic valve peak gradient measures 19.4 mmHg. Aortic valve area, by VTI measures 0.84 cm. 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 Pulmonic Valve: The pulmonic valve was normal in structure. Pulmonic valve regurgitation is not visualized. No evidence of pulmonic stenosis. Aorta: The aortic root is normal in size and structure. Venous: The inferior vena cava is normal in size with greater than 50% respiratory variability, suggesting right atrial pressure of 3 mmHg. IAS/Shunts: No atrial level shunt detected by color flow Doppler. LEFT VENTRICLE PLAX 2D LVIDd:         3.80 cm   Diastology LVIDs:         2.90 cm   LV e' medial:    5.87 cm/s LV PW:         1.00 cm   LV E/e' medial:  7.4 LV IVS:        1.00 cm   LV e' lateral:   9.25 cm/s LVOT diam:     1.70 cm   LV E/e' lateral: 4.7 LV SV:         34 LV SV Index:   17 LVOT Area:     2.27 cm  IVC IVC diam: 1.20 cm AORTIC VALVE AV Area (Vmax):    0.88 cm AV Area (Vmean):   0.84 cm AV Area (VTI):     0.84 cm AV Vmax:           220.00 cm/s AV Vmean:          148.000 cm/s AV VTI:            0.398 m AV Peak Grad:      19.4 mmHg AV Mean Grad:      10.0 mmHg LVOT Vmax:         85.05 cm/s LVOT Vmean:        54.650 cm/s LVOT VTI:          0.148 m LVOT/AV VTI ratio: 0.37  AORTA Ao Root diam: 3.40 cm Ao Asc diam:  3.90 cm MITRAL VALVE MV Area (PHT): 1.98 cm    SHUNTS MV Decel Time: 384 msec    Systemic VTI:  0.15 m MV E velocity: 43.70 cm/s  Systemic Diam: 1.70 cm MV A velocity: 87.80 cm/s MV E/A ratio:  0.50 Candee Furbish MD Electronically signed by Candee Furbish MD Signature Date/Time: 05/17/2021/4:40:36 PM    Final         Scheduled Meds:  clotrimazole   Topical BID   enoxaparin (LOVENOX) injection  40 mg Subcutaneous Q24H   fluticasone furoate-vilanterol  1 puff Inhalation Daily   insulin aspart  0-15 Units Subcutaneous TID WC   insulin aspart  0-5 Units Subcutaneous QHS   metoprolol tartrate  2.5 mg Intravenous Q6H   nystatin  5 mL Oral QID   Continuous Infusions:  dextrose 5 % and 0.9% NaCl 75 mL/hr at 05/19/21 0553   thiamine injection Stopped (05/17/21 1852)     LOS: 6 days    Time spent: 35 minutes    Johanthan Kneeland A Jermayne Sweeney, MD [facility]   If 7PM-7AM, please contact night-coverage [email or web address]  05/19/2021, 3:22 PM

## 2021-05-20 DIAGNOSIS — R6520 Severe sepsis without septic shock: Secondary | ICD-10-CM | POA: Diagnosis not present

## 2021-05-20 DIAGNOSIS — N179 Acute kidney failure, unspecified: Secondary | ICD-10-CM | POA: Diagnosis not present

## 2021-05-20 DIAGNOSIS — Z952 Presence of prosthetic heart valve: Secondary | ICD-10-CM | POA: Diagnosis not present

## 2021-05-20 DIAGNOSIS — T68XXXD Hypothermia, subsequent encounter: Secondary | ICD-10-CM | POA: Diagnosis not present

## 2021-05-20 DIAGNOSIS — I959 Hypotension, unspecified: Secondary | ICD-10-CM | POA: Diagnosis not present

## 2021-05-20 DIAGNOSIS — A419 Sepsis, unspecified organism: Secondary | ICD-10-CM | POA: Diagnosis not present

## 2021-05-20 LAB — BASIC METABOLIC PANEL
Anion gap: 8 (ref 5–15)
BUN: <5 mg/dL — ABNORMAL LOW (ref 8–23)
CO2: 28 mmol/L (ref 22–32)
Calcium: 8.6 mg/dL — ABNORMAL LOW (ref 8.9–10.3)
Chloride: 99 mmol/L (ref 98–111)
Creatinine, Ser: 0.92 mg/dL (ref 0.61–1.24)
GFR, Estimated: >60 mL/min (ref >60)
Glucose, Bld: 109 mg/dL — ABNORMAL HIGH (ref 70–99)
Potassium: 3.6 mmol/L (ref 3.5–5.1)
Sodium: 135 mmol/L (ref 135–145)

## 2021-05-20 LAB — GLUCOSE, CAPILLARY
Glucose-Capillary: 97 mg/dL (ref 70–99)
Glucose-Capillary: 114 mg/dL — ABNORMAL HIGH (ref 70–99)
Glucose-Capillary: 139 mg/dL — ABNORMAL HIGH (ref 70–99)
Glucose-Capillary: 120 mg/dL — ABNORMAL HIGH (ref 70–99)

## 2021-05-20 LAB — CBC
HCT: 44.2 % (ref 39.0–52.0)
Hemoglobin: 14.6 g/dL (ref 13.0–17.0)
MCH: 30.8 pg (ref 26.0–34.0)
MCHC: 33 g/dL (ref 30.0–36.0)
MCV: 93.2 fL (ref 80.0–100.0)
Platelets: 238 10*3/uL (ref 150–400)
RBC: 4.74 MIL/uL (ref 4.22–5.81)
RDW: 14.4 % (ref 11.5–15.5)
WBC: 8.6 10*3/uL (ref 4.0–10.5)
nRBC: 0 % (ref 0.0–0.2)

## 2021-05-20 MED ORDER — MAGNESIUM SULFATE 2 GM/50ML IV SOLN
2.0000 g | Freq: Once | INTRAVENOUS | Status: AC
  Administered 2021-05-20: 2 g via INTRAVENOUS

## 2021-05-20 MED ORDER — POTASSIUM CHLORIDE 20 MEQ PO PACK
40.0000 meq | PACK | Freq: Once | ORAL | Status: AC
  Administered 2021-05-21: 40 meq via ORAL
  Filled 2021-05-20: qty 2

## 2021-05-20 MED ORDER — AMLODIPINE BESYLATE 5 MG PO TABS
5.0000 mg | ORAL_TABLET | Freq: Every day | ORAL | Status: DC
  Administered 2021-05-20 – 2021-05-26 (×7): 5 mg via ORAL
  Filled 2021-05-20 (×8): qty 1

## 2021-05-20 MED ORDER — METOPROLOL TARTRATE 5 MG/5ML IV SOLN
2.5000 mg | Freq: Once | INTRAVENOUS | Status: DC
  Administered 2021-05-20: 2.5 mg via INTRAVENOUS

## 2021-05-20 MED ORDER — METOPROLOL TARTRATE 5 MG/5ML IV SOLN
2.5000 mg | Freq: Three times a day (TID) | INTRAVENOUS | Status: DC
  Administered 2021-05-21 – 2021-05-23 (×7): 2.5 mg via INTRAVENOUS
  Filled 2021-05-20 (×7): qty 5

## 2021-05-20 MED ORDER — AMIODARONE HCL IN DEXTROSE 360-4.14 MG/200ML-% IV SOLN
30.0000 mg/h | INTRAVENOUS | Status: DC
  Administered 2021-05-20 – 2021-05-23 (×6): 30 mg/h via INTRAVENOUS
  Filled 2021-05-20 (×8): qty 200

## 2021-05-20 MED ORDER — METOPROLOL TARTRATE 5 MG/5ML IV SOLN
2.5000 mg | Freq: Once | INTRAVENOUS | Status: AC
  Administered 2021-05-20: 2.5 mg via INTRAVENOUS

## 2021-05-20 MED ORDER — SODIUM CHLORIDE 0.9 % IV BOLUS
500.0000 mL | Freq: Once | INTRAVENOUS | Status: AC
  Administered 2021-05-20: 500 mL via INTRAVENOUS

## 2021-05-20 MED ORDER — AMIODARONE HCL IN DEXTROSE 360-4.14 MG/200ML-% IV SOLN
60.0000 mg/h | INTRAVENOUS | Status: AC
  Administered 2021-05-20: 60 mg/h via INTRAVENOUS
  Filled 2021-05-20: qty 200

## 2021-05-20 MED ORDER — POTASSIUM CHLORIDE CRYS ER 20 MEQ PO TBCR: 40.0000 meq | EXTENDED_RELEASE_TABLET | Freq: Once | ORAL | Status: DC

## 2021-05-20 MED ORDER — AMIODARONE IV BOLUS ONLY 150 MG/100ML
150.0000 mg | Freq: Once | INTRAVENOUS | Status: AC
  Administered 2021-05-20: 150 mg via INTRAVENOUS
  Filled 2021-05-20 (×2): qty 100

## 2021-05-20 MED ORDER — ADENOSINE 6 MG/2ML IV SOLN
6.0000 mg | Freq: Once | INTRAVENOUS | Status: DC
  Filled 2021-05-20: qty 2

## 2021-05-20 NOTE — Progress Notes (Signed)
PT Cancellation Note  Patient Details Name: Michael Hines MRN: [id number] DOB: [birth year]   Cancelled Treatment:    Reason Eval/Treat Not Completed: Patient at procedure or test/unavailable  Multiple practitioners at bedside. Will follow-up if medically appropriate and schedule permits.    Arby Barrette, PT Pager [phone]   Rexanne Mano 05/20/2021, 11:27 AM

## 2021-05-20 NOTE — Progress Notes (Signed)
Pt HR of 170's, Notified via telemetry. Upon initial assessment, pt noted to be at 179 on monitor. Attending physician notified. Initial interventions unsuccessful. Rapid response initiated. Attending and rapid nurse arrived to beside soon after. Cardiologist paged and arrived at bedside. Interventions per cardiologist and attending carried out at bedside, pt now stabilized. Will continue to monitor status and update attending of any new findings.

## 2021-05-20 NOTE — Progress Notes (Signed)
SLP Cancellation Note  Patient Details Name: Michael Hines MRN: [id number] DOB: 16-Oct-1945   Cancelled treatment:       Reason Eval/Treat Not Completed: Patient at procedure or test/unavailable.  Multiple practitioners at bedside. Will follow-up if medically appropriate and schedule permits.   Michael Hines B. Quentin Ore, Michael Hines, Michael Hines Speech Language Pathologist Office: [phone]  Shonna Chock 05/20/2021, 12:11 PM

## 2021-05-20 NOTE — Progress Notes (Signed)
   05/20/21 1029  Assess: MEWS Score  BP (!) 143/99  Pulse Rate (!) 168  ECG Heart Rate (!) 148  Resp (!) 24  Level of Consciousness Responds to Voice  SpO2 93 %  O2 Device Nasal Cannula  O2 Flow Rate (L/min) 3 L/min  Assess: MEWS Score  MEWS Temp 0  MEWS Systolic 0  MEWS Pulse 3  MEWS RR 1  MEWS LOC 1  MEWS Score 5  MEWS Score Color Red  Assess: if the MEWS score is Yellow or Red  Were vital signs taken at a resting state? Yes  Focused Assessment Change from prior assessment (see assessment flowsheet)  Early Detection of Sepsis Score *See Row Information* Low  MEWS guidelines implemented *See Row Information* Yes  Treat  MEWS Interventions Administered scheduled meds/treatments;Escalated (See documentation below)  Pain Scale 0-10  Pain Score 0  Take Vital Signs  Increase Vital Sign Frequency  Red: Q 1hr X 4 then Q 4hr X 4, if remains red, continue Q 4hrs  Escalate  MEWS: Escalate Red: discuss with charge nurse/RN and provider, consider discussing with RRT  Notify: Charge Nurse/RN  Name of Charge Nurse/RN Notified John B RN  Date Charge Nurse/RN Notified 05/20/21  Time Charge Nurse/RN Notified 1041  Notify: Provider  Provider Name/Title Regalado, MD  Date Provider Notified 05/20/21  Time Provider Notified 1025  Notification Type Call  Notification Reason Change in status;Critical result  Provider response See new orders  Date of Provider Response 05/20/21  Time of Provider Response 1025  Notify: Rapid Response  Name of Rapid Response RN Notified [facility] RN  Date Rapid Response Notified 05/20/21  Time Rapid Response Notified 5945  Document  Patient Outcome Stabilized after interventions

## 2021-05-20 NOTE — Progress Notes (Signed)
     Referral received for Michael Hines :goals of care discussion. Chart reviewed and updates received from Dr. Tyrell Antonio. Patient assessed and is unable to engage appropriately in discussions. Somnolent. Cardiology team and rapid response team at the bedside providing care in the setting of SVT.   Patient's son Michael Hines) and sister Michael Hines) are present. Introduced myself and the role of palliative. Family verbalized understanding and appreciation. Son tearful expressing he doesn't want to see his father continue to go through the "same thing over and over!" Emotional support provided. Michael Hines shares his brother Michael Hines is patient's POA however wife is also involved in decision making. I was able to call and speak with wife, Michael Hines also.   [place] meeting scheduled for 10/11 @ 2:30pm. Family is aware we will meet at patient's bedside. Meeting will consist of patient's wife, 2 children, and his sister. Family aware they may all come for scheduled meeting as one time exception.   Thank you for your referral and allowing PMT to assist in [name]'s care.   Alda Lea, AGPCNP-BC Palliative Medicine Team  Phone: [phone] Pager: [phone] Amion: N. [place]   NO CHARGE

## 2021-05-20 NOTE — Consult Note (Signed)
Cardiology Consultation:   Patient ID: Michael Hines MRN: [id number]; DOB: [birth year]  Admit date: 05/13/2021 Date of Consult: 05/20/2021  PCP:  Finis Bud, MD   [place] Prov[facility] Cardiologist:  [facility] CTS: Dr. Kipp Brood   Patient Profile:   Michael Hines is a 75 y.o. male with a hx of smoker, COPD, DM, anxiety, VHD w/AVR (bioprosthetic 2013) Thoracic aortic aneurysm (follows with Dr. Kipp Brood), CBP,  Parkinson's disease who is being seen 05/20/2021 for the evaluation of SVT at the request of Dr. Sallyanne Kuster.  History of Present Illness:   [name] was admitted to [facility] 04/12/21 with AMS, chills, respiratory insufficiency, sepsis >> MSSA bacteremia TEE was done without noted endocarditis Discharged 04/19/21 to rehab facility to complete 4 weeks of Ancef.  He was readmitted to [facility] 05/13/21 via ID office presenting to the office for his follow up confused , tachycardic, tachypneic, hypothermic (95degrees) and hypotensive (SBP 90's) Admitted with suspect septic shock  Initial tx with IVF, started on cefepime and vancomycin His initial 4 week abx completed and the picc line removed as a possible source of infection.  As clinical course progressed, noted at rehab poor intake reported by family and perhaps a more overall functional decline 05/15/21 antibiotics stopped with no fever/resolved hypothermia and neg BC  Confusion though persists, acute metabolic encephalopathy, case d/w neurology by IM team and neuro w/u pursued, started on Thiamine  05/16/21, cardiology consulted for the development of an SVT resolved after IV lopressor TTE on 04/13/2021 showed EF 60 to 65%, mild LVH, grade 1 diastolic dysfunction, mild RV dysfunction, bioprosthetic AVR with findings consistent with patient prosthesis mismatch (EOA 1.16 cm, DI 0.37, AT 71 ms, mean gradient 19 mmHg).   TEE on 04/18/2021 showed 21 mm bioprosthetic aortic valve (V-max 2.6 m/s, mean gradient 15 mmHg, EOA  1.7 cm, DI 0.54), mild central AI, no definitive evidence of endocarditis, normal biventricular function, mild MR, ascending aortic aneurysm measuring 45 mm.  Neurology consulted 10/7 officially with no evidecne of stroke, seizures felt most c/w hypoactive delirium in the setting of multiple medical problems, on a possible background of dementia given memory impairments predating illness.  However given concern for potential bacterial endocarditis, does need a more diagnostic MRI brain to effectively rule out new structural lesion such as stroke which would change management  Pt was declining PO meds, PSVT episodes > IV lopressor A NSVT also reported, planned to continue BB and transition to PO when confusion improves/taking PO Cardiology signed off yesterday   RN this AM held lopressor dose 2/2 nocturnal rates 40's cardiology called back today with recurrent SVT Started on amiodarone by Dr. Loletha Grayer, EP is asked to weigh in Planned for repeat TEE Wed to follow up on bacteremia and prosthetic AVR TTE this admission was without significant changes from last month  Palliative consult is pending  He is afebrile BP stable/hypertensive at times  BC (x2) neg x5 days Urine cx neg  LABS K+ 3.6 BUN/Creat <5/0.92 WBC 8.6 H/H 14/44 Plts 238  Mag yesterday 2.0  The patient is awake, oriented to self, he is able to tell me he is in a hospital, not the name of the hospital, unable to tell me the year, did not tell me the president's name, but grumbled in dislike, his wife adding, not to get him started on politics!  He is cooperative Neither the patient or his sife (of 4 years) recall any heart rhythm, problems Unclear weather or not he feels  the tachycardia/or any symptoms with it    Past Medical History:  Diagnosis Date   Anxiety    B12 deficiency    Back pain    Diabetes mellitus without complication ([place])    Hyperlipidemia    Hypotension 05/13/2021   Hypothermia 05/13/2021   Severe  sepsis with septic shock ([place]) 05/13/2021   Tachycardia 05/13/2021   Tachypnea 05/13/2021   Testosterone insufficiency     Past Surgical History:  Procedure Laterality Date   AORTIC VALVE REPLACEMENT  2013   redo bioprosthetic valve at [facility], Dr Mauricio Po   CHOLECYSTECTOMY     STRABISMUS SURGERY     TEE WITHOUT CARDIOVERSION N/A 09/07/2018   Procedure: TRANSESOPHAGEAL ECHOCARDIOGRAM (TEE);  Surgeon: Acie Fredrickson Wonda Cheng, MD;  Location: [facility] ENDOSCOPY;  Service: Cardiovascular;  Laterality: N/A;   TEE WITHOUT CARDIOVERSION N/A 04/18/2021   Procedure: TRANSESOPHAGEAL ECHOCARDIOGRAM (TEE);  Surgeon: Geralynn Rile, MD;  Location: [place];  Service: Cardiovascular;  Laterality: N/A;     Home Medications:  Prior to Admission medications   Medication Sig Start Date End Date Taking? Authorizing Provider  bisacodyl (DULCOLAX) 10 MG suppository Place 10 mg rectally See admin instructions. If no relief from milk of mag. Give 1 suppository rectally x 1 dose in 24 hours for constipation   Yes [provider]  blood glucose meter kit and supplies KIT by Does not apply route daily as needed. Check blood sugar 3 times daily   Yes [provider]  budesonide-formoterol (SYMBICORT) 80-4.5 MCG/ACT inhaler Inhale 2 puffs into the lungs 2 (two) times daily.   Yes [provider]  buPROPion (WELLBUTRIN XL) 300 MG 24 hr tablet Take 300 mg by mouth daily. 07/04/19  Yes [provider]  carbidopa-levodopa (SINEMET IR) 25-100 MG tablet Take 1 tablet by mouth 2 (two) times daily. 01/03/20  Yes [provider]  ceFAZolin (ANCEF) IVPB Inject 2 g into the vein every 8 (eight) hours. Indication:  MSSA bacteremia First Dose: Yes Last Day of Therapy:  05/13/21 Labs - Once weekly:  CBC/D and BMP, Labs - Every other week:  ESR and CRP Method of administration: IV Push Method of administration may be changed at the discretion of home infusion pharmacist based upon assessment  of the patient and/or caregiver's ability to self-administer the medication ordered. 04/19/21  Yes Vann, Jessica U, DO  gabapentin (NEURONTIN) 300 MG capsule Take 300 mg by mouth in the morning.   Yes [provider]  gabapentin (NEURONTIN) 600 MG tablet Take 600 mg by mouth every evening.   Yes [provider]  HYDROcodone-acetaminophen (NORCO) 10-325 MG tablet Take 1 tablet by mouth every 8 (eight) hours.   Yes [provider]  insulin aspart (NOVOLOG FLEXPEN) 100 UNIT/ML FlexPen Inject 0-15 Units into the skin See admin instructions. 3 times daily per sliding scale 70-120= 0 units 121-150= 2 units 151-200= 3 units 201-250= 5 units 251-300=8 units 301-350= 11 units 351-400= 15 units Greater than 400 give 15 units and call md   Yes [provider]  ipratropium-albuterol (DUONEB) 0.5-2.5 (3) MG/3ML SOLN Take 3 mLs by nebulization every 6 (six) hours as needed. Patient taking differently: Take 3 mLs by nebulization every 6 (six) hours as needed (shortness of breath/wheezing). 04/19/21  Yes Geradine Girt, DO  loperamide (IMODIUM) 2 MG capsule Take 1 capsule (2 mg total) by mouth as needed for diarrhea or loose stools. 09/07/18  Yes Irene Pap N, DO  magnesium hydroxide (MILK OF MAGNESIA) 400 MG/5ML  suspension Take 30 mLs by mouth daily as needed for mild constipation.   Yes [provider]  Menthol, Topical Analgesic, (BIOFREEZE) 4 % GEL Apply 1 application topically 4 (four) times daily. Apply to lower back   Yes [provider]  NON FORMULARY Flush PICC line with NS 10 ml before ABT IV medication   Yes [provider]  NON FORMULARY Flush PICC line with NS 10 ml after  ABT IV medications followed by 3 ml Heparin flush   Yes [provider]  Sodium Phosphates (RA SALINE ENEMA RE) Place 1 enema rectally See admin instructions. If no relief from milk of mag or bisacodyl suppository give 1 dose x 24 hours as needed for  constipation   Yes [provider]  cyanocobalamin (,VITAMIN B-12,) 1000 MCG/ML injection Inject 1,000 mcg into the muscle every 30 (thirty) days.  08/17/18   [provider]  HYDROcodone-acetaminophen (NORCO) 10-325 MG tablet Take 1 tablet by mouth every 6 (six) hours as needed. Patient not taking: No sig reported 04/19/21   Geradine Girt, DO  insulin aspart (NOVOLOG) 100 UNIT/ML injection Inject 0-15 Units into the skin 3 (three) times daily with meals. Patient not taking: Reported on 05/14/2021 04/19/21   Geradine Girt, DO    Inpatient Medications: Scheduled Meds:  adenosine (ADENOCARD) IV  6 mg Intravenous Once   amLODipine  5 mg Oral Daily   atorvastatin  40 mg Oral QHS   clotrimazole   Topical BID   enoxaparin (LOVENOX) injection  40 mg Subcutaneous Q24H   fluticasone furoate-vilanterol  1 puff Inhalation Daily   insulin aspart  0-15 Units Subcutaneous TID WC   insulin aspart  0-5 Units Subcutaneous QHS   metoprolol tartrate  2.5 mg Intravenous Q6H   nystatin  5 mL Oral QID   Continuous Infusions:  amiodarone     amiodarone     dextrose 5 % and 0.9% NaCl 75 mL/hr at 05/20/21 7893   magnesium sulfate bolus IVPB 2 g (05/20/21 1149)   PRN Meds: acetaminophen **OR** acetaminophen, ipratropium-albuterol, ondansetron **OR** ondansetron (ZOFRAN) IV  Allergies:   No Known Allergies  Social History:   Social History   Socioeconomic History   Marital status: Married    Spouse name: Not on file   Number of children: Not on file   Years of education: Not on file   Highest education level: Not on file  Occupational History   Not on file  Tobacco Use   Smoking status: Former    Packs/day: 1.50    Years: 55.00    Pack years: 82.50    Types: Cigarettes    Quit date: 08/24/2018    Years since quitting: 2.7   Smokeless tobacco: Former  Scientific laboratory technician Use: Never used  Substance and Sexual Activity   Alcohol use: Not Currently   Drug use: Never   Sexual  activity: Not on file  Other Topics Concern   Not on file  Social History Narrative   Not on file   Social Determinants of Health   Financial Resource Strain: Not on file  Food Insecurity: Not on file  Transportation Needs: Not on file  Physical Activity: Not on file  Stress: Not on file  Social Connections: Not on file  Intimate Partner Violence: Not on file    Family History:   Family History  Problem Relation Age of Onset   Hypertension Mother    Diabetes Mother  ROS:  Please see the history of present illness.  All other ROS reviewed and negative.     Physical Exam/Data:   Vitals:   05/20/21 0844 05/20/21 1029 05/20/21 1050 05/20/21 1100  BP: (!) 164/77 (!) 143/99  125/73  Pulse: (!) 48 (!) 168 (!) 177 82  Resp: 19 (!) 24 (!) 25 (!) 25  Temp: 97.6 F (36.4 C)     TempSrc: Axillary     SpO2: 96% 93% 93% 95%  Weight:      Height:        Intake/Output Summary (Last 24 hours) at 05/20/2021 1211 Last data filed at 05/20/2021 0851 Gross per 24 hour  Intake 2135.56 ml  Output 1250 ml  Net 885.56 ml   Last 3 Weights 05/20/2021 05/19/2021 05/18/2021  Weight (lbs) 187 lb 9.8 oz 187 lb 6.3 oz 186 lb 11.7 oz  Weight (kg) 85.1 kg 85 kg 84.7 kg     Body mass index is 29.38 kg/m.  General:  Well nourished, well developed, in no acute distress, chronically ill appearing HEENT: normal Neck: no JVD Vascular: No carotid bruits; Distal pulses 2+ bilaterally Cardiac:  RRR; 2/6 SM, no gallops or rubs Lungs:  clear to auscultation bilaterally, no wheezing, rhonchi or rales  Abd: soft, nontender Ext: no edema Musculoskeletal:  No deformities Skin: warm and dry  Neuro: AAO x1, otherwise no focal abnormalities noted Psych:  he is pleasant and cooperative  EKG:  The EKG was personally reviewed and demonstrates:    SR 74bpm TODAY SVT (short RP), 159bpm,  SVT 172bpmPVC, some sinus betas  Telemetry:  Telemetry was personally reviewed and demonstrates:   SR 70's,  SVT towards 170's-180s Occ PVCs, are multifocal, some couplets  Relevant CV Studies:  05/17/21: TTE IMPRESSIONS   1. Left ventricular ejection fraction, by estimation, is 60 to 65%. The  left ventricle has normal function. The left ventricle has no regional  wall motion abnormalities. Left ventricular diastolic parameters are  consistent with Grade I diastolic  dysfunction (impaired relaxation).   2. Right ventricular systolic function is normal. The right ventricular  size is normal.   3. The mitral valve is normal in structure. No evidence of mitral valve  regurgitation. No evidence of mitral stenosis.   4. The aortic valve has been repaired/replaced. Aortic valve  regurgitation is not visualized. No aortic stenosis is present. There is a  21 mm bioprosthetic valve present in the aortic position. Aortic valve  mean gradient measures 10.0 mmHg. Aortic valve   Vmax measures 2.20 m/s.   5. The inferior vena cava is normal in size with greater than 50%  respiratory variability, suggesting right atrial pressure of 3 mmHg.   Comparison(s): No significant change from prior study. Prior images  reviewed side by side.    04/18/21: TEE 21 mm Prosthetic aortic valve with signs of structural deterioration, but no signs of endocarditis. No signs of endocarditis.  Normal LV/RV function.   Full report to follow. Further management per primary team.    04/13/21; TTE IMPRESSIONS   1. Image quality is insufficient to exclude valvular vegetations.  Consider TEE if clinically indicated.   2. Left ventricular ejection fraction, by estimation, is 60 to 65%. The  left ventricle has normal function. Left ventricular endocardial border  not optimally defined to evaluate regional wall motion. There is mild left  ventricular hypertrophy. Left  ventricular diastolic parameters are consistent with Grade I diastolic  dysfunction (impaired relaxation).   3. Right ventricular  systolic function is mildly  reduced. The right  ventricular size is normal. Tricuspid regurgitation signal is inadequate  for assessing PA pressure.   4. The mitral valve is grossly normal. Trivial mitral valve  regurgitation. No evidence of mitral stenosis.   5. S/p redo surgical aortic valve replacement. Calculations most  consistent with patient prosthesis mismatch. EOA 1.16 cm2, iEOA 0.54, DI  0.37, AT 71 msec. . The aortic valve has been repaired/replaced. Aortic  valve regurgitation is trivial. There is a  21 mm pericardial valve present in the aortic position. Procedure Date:  Dec 2013. Aortic valve mean gradient measures 19.0 mmHg.   6. Aortic dilatation noted. There is mild dilatation of the ascending  aorta, measuring 44 mm.   7. The inferior vena cava is normal in size with greater than 50%  respiratory variability, suggesting right atrial pressure of 3 mmHg.   Comparison(s): Compared to report of echo from 6/22 at outside hospital,  gradient through aortic valve is known and stable.   Laboratory Data:  High Sensitivity Troponin:   Recent Labs  Lab 05/16/21 1639 05/16/21 1830  TROPONINIHS 24* 21*     Chemistry Recent Labs  Lab 05/17/21 0726 05/18/21 1008 05/19/21 0131 05/20/21 0949  NA 130* 131* 132* 135  K 4.0 3.3* 3.5 3.6  CL 97* 97* 97* 99  CO2 _0 GLUCOSE 108* 112* 103* 109*  BUN <5* <5* <5* <5*  CREATININE 0.88 0.83 0.87 0.92  CALCIUM 8.4* 8.4* 8.7* 8.6*  MG 1.8 1.5* 2.0  --   GFRNONAA >60 >60 >60 >60  ANIONGAP _1 Recent Labs  Lab 05/13/21 1654 05/14/21 0630  PROT 6.3* 5.2*  ALBUMIN 2.9* 2.5*  AST 21 17  ALT <5 5  ALKPHOS 118 80  BILITOT 1.3* 1.1   Lipids  Recent Labs  Lab 05/18/21 1903  CHOL 201*  TRIG 131  HDL 32*  LDLCALC 143*  CHOLHDL 6.3    Hematology Recent Labs  Lab 05/18/21 1008 05/19/21 0131 05/20/21 0949  WBC 9.0 7.6 8.6  RBC 4.76 5.02 4.74  HGB 14.4 15.1 14.6  HCT 43.5 46.5 44.2  MCV 91.4 92.6 93.2  MCH 30.3 30.1 30.8   MCHC 33.1 32.5 33.0  RDW 14.3 14.6 14.4  PLT 217 242 238   Thyroid  Recent Labs  Lab 05/15/21 1227  TSH 1.957    BNPNo results for input(s): BNP, PROBNP in the last 168 hours.  DDimer  Recent Labs  Lab 05/13/21 2025  DDIMER 1.20*     Radiology/Studies:  CT ANGIO HEAD W OR WO CONTRAST Result Date: 05/18/2021 CLINICAL DATA:  Stroke/TIA, assess extracranial arteries; Stroke/TIA, assess intracranial arteries EXAM: CT ANGIOGRAPHY HEAD AND NECK TECHNIQUE: Multidetector CT imaging of the head and neck was performed using the standard protocol during bolus administration of intravenous contrast. Multiplanar CT image reconstructions and MIPs were obtained to evaluate the vascular anatomy. Carotid stenosis measurements (when applicable) are obtained utilizing NASCET criteria, using the distal internal carotid diameter as the denominator. CONTRAST:  8m OMNIPAQUE IOHEXOL 350 MG/ML SOLN COMPARISON:  None. FINDINGS: CT HEAD FINDINGS Brain: There is no mass, hemorrhage or extra-axial collection. There is generalized atrophy without lobar predilection. There is an old left cerebellar small vessel infarct. There is hypoattenuation of the periventricular white matter, most commonly indicating chronic ischemic microangiopathy. Skull: The visualized skull base, calvarium and extracranial soft tissues are normal. Sinuses/Orbits: No fluid levels or advanced mucosal thickening of  the visualized paranasal sinuses. No mastoid or middle ear effusion. The orbits are normal. CTA NECK FINDINGS SKELETON: There is no bony spinal canal stenosis. No lytic or blastic lesion. OTHER NECK: Normal pharynx, larynx and major salivary glands. No cervical lymphadenopathy. Unremarkable thyroid gland. UPPER CHEST: No pneumothorax or pleural effusion. No nodules or masses. AORTIC ARCH: There is calcific atherosclerosis of the aortic arch. There is no aneurysm, dissection or hemodynamically significant stenosis of the visualized portion  of the aorta. Conventional 3 vessel aortic branching pattern. The visualized proximal subclavian arteries are widely patent. RIGHT CAROTID SYSTEM: No dissection, occlusion or aneurysm. Mild atherosclerotic calcification at the carotid bifurcation without hemodynamically significant stenosis. LEFT CAROTID SYSTEM: No dissection, occlusion or aneurysm. Mild atherosclerotic calcification at the carotid bifurcation without hemodynamically significant stenosis. VERTEBRAL ARTERIES: Left dominant configuration. Both origins are clearly patent. There is no dissection, occlusion or flow-limiting stenosis to the skull base (V1-V3 segments). CTA HEAD FINDINGS CTA head images are degraded by motion. POSTERIOR CIRCULATION: --Vertebral arteries: Normal V4 segments. --Inferior cerebellar arteries: Normal. --Basilar artery: Normal. --Superior cerebellar arteries: Normal. --Posterior cerebral arteries (PCA): Normal. ANTERIOR CIRCULATION: --Intracranial internal carotid arteries: Normal. --Anterior cerebral arteries (ACA): Normal. Both A1 segments are present. Patent anterior communicating artery (a-comm). --Middle cerebral arteries (MCA): Normal. VENOUS SINUSES: As permitted by contrast timing, patent. ANATOMIC VARIANTS: None Review of the MIP images confirms the above findings. IMPRESSION: 1. No emergent large vessel occlusion or high-grade stenosis of the head or neck. 2. 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 Electronically Signed   By: Ulyses Jarred M.D.   On: 05/18/2021 20:37     CT Angio Chest Pulmonary Embolism (PE) W or WO Contrast Result Date: 05/17/2021 CLINICAL DATA:  Shortness of breath EXAM: CT ANGIOGRAPHY CHEST WITH CONTRAST TECHNIQUE: Multidetector CT imaging of the chest was performed using the standard protocol during bolus administration of intravenous contrast. Multiplanar CT image reconstructions and MIPs were obtained to evaluate the vascular anatomy. CONTRAST:  178m OMNIPAQUE IOHEXOL 350 MG/ML SOLN  COMPARISON:  04/12/2021 FINDINGS: Cardiovascular: Satisfactory opacification of the pulmonary arteries to the segmental level. No evidence of pulmonary embolism. Normal heart size. No pericardial effusion. Prior CABG. Ascending thoracic aortic aneurysm measuring 4.8 cm in transverse diameter at the level of the main pulmonary artery. Thoracic aortic atherosclerosis. Mediastinum/Nodes: No enlarged mediastinal, hilar, or axillary lymph nodes. Thyroid gland, trachea, and esophagus demonstrate no significant findings. Lungs/Pleura: No focal consolidation, pleural effusion or pneumothorax. Mild dependent atelectasis bilaterally. Upper Abdomen: No acute abnormality. Musculoskeletal: No chest wall abnormality. No acute or significant osseous findings. Review of the MIP images confirms the above findings. IMPRESSION: 1. No evidence of a pulmonary embolus. 2. Ascending thoracic aortic aneurysm measuring 4.8 cm in transverse diameter at the level of the main pulmonary artery. Ascending thoracic aortic aneurysm. Recommend semi-annual imaging followup by CTA or MRA and referral to cardiothoracic surgery if not already obtained. This recommendation follows 2010 ACCF/AHA/AATS/ACR/ASA/SCA/SCAI/SIR/STS/SVM Guidelines for the Diagnosis and Management of Patients With Thoracic Aortic Disease. Circulation. 2010; 121:: [id number] Aortic aneurysm NOS (ICD10-I71.9) Electronically Signed   By: HKathreen DevoidM.D.   On: 05/17/2021 18:21   MR BRAIN WO CONTRAST Result Date: 05/18/2021 CLINICAL DATA:  Neuro deficit, acute, stroke suspected. EXAM: MRI HEAD WITHOUT CONTRAST TECHNIQUE: Multiplanar, multiecho pulse sequences of the brain and surrounding structures were obtained without intravenous contrast. COMPARISON:  Brain MRI 05/17/2021. Brain MRI 03/07/2020. Head CT 04/12/2021. FINDINGS: Brain: Intermittently motion degraded examination, limiting evaluation. Most notably, there is moderate to moderately  severe motion degradation of the axial  T2 FLAIR sequence, moderate motion degradation of the axial T1 weighted sequence and severe motion degradation of the coronal T2 TSE sequence. Redemonstrated central predominant cerebral atrophy. Mild cerebellar atrophy is also present. Apparent punctate focus of cortical diffusion weighted hyperintensity within the anterior right frontal lobe, appreciated on the axial diffusion-weighted sequence only (series 5, image 82). This may reflect a punctate acute infarct or artifact. Mild multifocal T2 FLAIR hyperintense signal abnormality within the cerebral white matter and pons, nonspecific but compatible with chronic small vessel ischemic disease. Chronic lacunar infarct within the inferior left cerebellar hemisphere. No evidence of an intracranial mass. No chronic intracranial blood products. No extra-axial fluid collection. No midline shift. Vascular: Maintained flow voids within the proximal large arterial vessels. Skull and upper cervical spine: No focal suspicious marrow lesion. Sinuses/Orbits: Visualized orbits show no acute finding. Right lens replacement. No significant paranasal sinus disease at the imaged levels. Other: Trace fluid within the bilateral mastoid air cells. IMPRESSION: Intermittently motion degraded examination, as described and limiting evaluation. Punctate acute cortical infarct versus artifact within the anterior right frontal lobe. Mild chronic small vessel ischemic changes within the cerebral white matter and pons, similar as compared to the brain MRI of 02/06/2020. 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 Central predominant cerebral atrophy. Mild cerebellar atrophy is also present. Trace fluid within the bilateral mastoid air cells. Electronically Signed   By: Kellie Simmering D.O.   On: 05/18/2021 17:35   MR BRAIN WO CONTRAST Result Date: 05/17/2021 CLINICAL DATA:  Initial evaluation for acute delirium. EXAM: MRI HEAD WITHOUT CONTRAST  TECHNIQUE: Multiplanar, multiecho pulse sequences of the brain and surrounding structures were obtained without intravenous contrast. COMPARISON:  From prior CT from 04/12/2021. FINDINGS: Brain: Examination is severely limited as the patient was unable to tolerate the full length of the exam. Axial and coronal DWI weighted sequences, with axial FLAIR and sagittal T1 weighted sequences only were performed. The provided images are severely degraded by motion artifact, particularly the FLAIR and T1 weighted sequence which are centrally nondiagnostic. Diffusion-weighted imaging demonstrates no definite evidence for acute or subacute infarct. Gray-white matter differentiation grossly maintained. Underlying atrophy noted. Associated ventriculomegaly grossly stable from prior. No visible mass effect or midline shift. No obvious mass lesion or extra-axial fluid collection. Vascular: Not well assessed on this limited exam. Skull and upper cervical spine: Not well assessed on this limited exam. Sinuses/Orbits: Not well assessed on this limited exam. Other: None. IMPRESSION: 1. Severely limited exam due to the patient's inability to tolerate the full length of the study and extensive motion artifact. No definite evidence for acute infarct. 2. The remainder of the examination is essentially nondiagnostic. Electronically Signed   By: Jeannine Boga M.D.   On: 05/17/2021 00:39   DG CHEST PORT 1 VIEW Result Date: 05/17/2021 CLINICAL DATA:  Altered mental status, hypoxia EXAM: PORTABLE CHEST 1 VIEW COMPARISON:  05/13/2021 chest radiograph. FINDINGS: Intact sternotomy wires. Stable cardiomediastinal silhouette with mild cardiomegaly and aortic valve prosthesis in place. No pneumothorax. No pleural effusion. No rib pain edema. No acute consolidative airspace disease. IMPRESSION: Mild cardiomegaly without overt pulmonary edema. No active pulmonary disease. Electronically Signed   By: Ilona Sorrel M.D.   On: 05/17/2021 16:23    EEG adult Result Date: 05/17/2021 Lora Havens, MD     05/17/2021 11:23 AM Patient Name: JEWELL RYANS MRN: [id number] Epilepsy Attending: Lora Havens Referring Physician/Provider: Dr Niel Hummer Date:  05/17/2021 Duration: 24.26 mins Patient history: 75 year old male with altered mental status.  EEG to evaluate for seizures. Level of alertness: Awake, asleep AEDs during EEG study: None Technical aspects: This EEG study was done with scalp electrodes positioned according to the 10-20 International system of electrode placement. Electrical activity was acquired at a sampling rate of _0  and reviewed with a high frequency filter of _1  and a low frequency filter of _2 . EEG data were recorded continuously and digitally stored. Description: No clear posterior dominant rhythm was seen.  Sleep was characterized by sleep spindles (12 to 14 Hz), maximal frontocentral region.  EEG showed continuous generalized predominantly 5 to 7 Hz theta as well as intermittent generalized 2 to 3 Hz delta slowing.  Hyperventilation and photic stimulation were not performed.   Patient was noted to "shaking on his right side" per EEG annotation at 1038 which was difficult to be on camera.  Concomitant EEG before, during and after the event did not show any EEG change to suggest seizure. ABNORMALITY - Continuous slow, generalized IMPRESSION: This study is suggestive of moderate diffuse encephalopathy, nonspecific to etiology.  No seizures or epileptiform discharges were seen throughout the recording. Patient was noted to "shaking on his right side" at 1038 without concomitant EEG change. This was most likely not an epileptic event. Priyanka Barbra Sarks       Assessment and Plan:   SVT (short RP) No known hx of the same  I do not think he is a procedural candidate for an ablation Agree with amiodarone, at least in the short term, he is a long standing smoker with known COPD Also known to have OSA and had declined  CPAP therapy  Continue metoprolol Do not not hold for asymptomatic or sleeping bradycardia   Dr. Quentin Ore will see later today   Risk Assessment/Risk Scores:    For questions or updates, please contact [facility] Please consult [email or web address] for contact info under    Signed, Baldwin Jamaica, PA-C  05/20/2021 12:11 PM

## 2021-05-20 NOTE — Progress Notes (Signed)
Pt with SB low heart rate in the early am today. Hr mid 40's while pt sleeping. Am 0600 dose of metoprolol held for HR 45.  Pt also noted to have sleep apnea, able to maintain o2 sats on 3 lpm/ ns, would wax and wane with breathing between 89-96%

## 2021-05-20 NOTE — Progress Notes (Signed)
PROGRESS NOTE    [name]  ION:[id number] DOB: 08/05/1946 DOA: 05/13/2021 PCP: Finis Bud, MD   Brief Narrative: 75 year old with past medical history significant for COPD, type 2 diabetes, hyperlipidemia, bioprosthetic aortic valve replacement and recent MSSA septicemia status post 4 weeks of IV Ancef which he will have finished 10/3 presented to the ID clinic for follow-up and found to be more confused than normal.  He is was diaphoretic and heart rate was in the 130s, temperature was 92.  They were not able to check his blood pressure.  He was transferred to the ED for further evaluation.  Patient was at a skilled nursing facility finishing up his IV antibiotic therapy.    Assessment & Plan:   Principal Problem:   Sepsis with acute organ dysfunction (HCC) Active Problems:   Diabetes mellitus without complication ([place])   S/P AVR   Diabetic polyneuropathy associated with type 2 diabetes mellitus (HCC)   Chronic bilateral low back pain without sciatica   Hypotension   Hypothermia   AKI (acute kidney injury) ([place])   Sepsis with acute organ dysfunction without septic shock ([place])  SIRS;  Patient presents with confusion, hypothermia, hypotension. Not Clear source of infection. Sepsis was ruled out.  Blood cultures no growth .  Chest x-ray no evidence for infection. Urine culture no growth to date.  ID was consulted due to recent MSSA bacteremia.  Patient was a started on cefepime and vancomycin.  Subsequently vancomycin was discontinued.  Cefepime was discontinued 10/5.  -Cardiology recommend TEE to rule out endocarditis. Plan to repeat Blood cultures.   Acute metabolic encephalopathy: Delirium, in the setting of recent infection, dehydration. Question Cefepime .  B12 984, thiamine level pending, started IV thiamine. Ammonia level 21 normal, B12 984 normal.  TSH 1.9 normal EEG; Moderate to diffuse encephalopathy.  MRI repeated old stroke, artifact.  ABG hypoxemia;  Placed on oxygen.  Patient has become more alert, but continue to be confuse and poor oral intake.  Plan for TEE to ruled  out endocarditis.   Hypoxemia; suspect Sleep apnea, hypoventilation.  CT angio negative for PE>  Continue  on 3 L oxygen.  Needs continuous oxygen supplementation.   Hyponatremia , hypokalemia, Hypomagnesemia;  Replaced.   SVT:  Patient had HR up 190-200.  Appreciate cardiology eval.  On  IV metoprolol, patient not taking oral meds.  ECHO : normal EF Develops episode of recurrent SVT 10/10, didn't resolved with IV metoprolol. Patient was off oxygen prior to episode. Became hypotensive, cardiology came at bedside. Started on IV amiodarone. HR improved.   Nutrition;  He has not been eating, refuse to eat.  Family will discussed about tub feeding. Will consult palliative care to assist,   Oral thrush:  Started Nystatin.   Type 2 diabetes: Continue with a sliding scale insulin.   Parkinsonism:  Vascular parkinsonism; I discussed with Dr Martie Round, patient's primary neurologist about holding Carbidopa-levodopa. He said we can hold for now, and he can reassess out patient needs for this medications.   Radiculopathy: Plan to hold gabapentin due to AMS.    Elevated D-dimer: Doppler lower extremity negative for DVT.    Estimated body mass index is 29.38 kg/m as calculated from the following:   Height as of this encounter: 5\' 7"  (1.702 m).   Weight as of this encounter: 85.1 kg.   DVT prophylaxis: Lovenox Code Status: Full code Family Communication: Wife and two son at bedside 10/09--- son and wife 10/10. Disposition Plan:  Status is: Inpatient  Remains inpatient appropriate because:IV treatments appropriate due to intensity of illness or inability to take PO  Dispo: The patient is from: SNF              Anticipated d/c is to: SNF              Patient currently is not medically stable to d/c.   Difficult to place patient No        Consultants:   ID  Procedures:    Antimicrobials:    Subjective: He was sleepy but would open eyes to voice, HR 180, he denies chest pain or dyspnea during episode.    Objective: Vitals:   05/20/21 1301 05/20/21 1400 05/20/21 1438 05/20/21 1606  BP: 133/81 117/75 132/71 131/78  Pulse: 68 64 (!) 50 75  Resp:  20 20 19   Temp: 97.7 F (36.5 C)  98.7 F (37.1 C) 98 F (36.7 C)  TempSrc: Axillary  Axillary Axillary  SpO2: 92% 93% 96% 94%  Weight:      Height:        Intake/Output Summary (Last 24 hours) at 05/20/2021 1803 Last data filed at 05/20/2021 1619 Gross per 24 hour  Intake 2678.59 ml  Output 2100 ml  Net 578.59 ml    Filed Weights   05/18/21 0419 05/19/21 0556 05/20/21 0415  Weight: 84.7 kg 85 kg 85.1 kg    Examination:  General exam: Chronic ill appearing  Respiratory system: CTA Cardiovascular system: S 1, S 2 RRR Gastrointestinal system: BS present, soft, nt Central nervous system: sleepy, answer questions Extremities: no edema   Data Reviewed: I have personally reviewed following labs and imaging studies  CBC: Recent Labs  Lab 05/14/21 0630 05/15/21 1225 05/17/21 0726 05/18/21 1008 05/19/21 0131 05/20/21 0949  WBC 9.4 9.7 10.7* 9.0 7.6 8.6  NEUTROABS 6.4  --   --   --   --   --   HGB 13.7 13.6 15.5 14.4 15.1 14.6  HCT 42.2 41.2 45.8 43.5 46.5 44.2  MCV 94.0 93.2 90.7 91.4 92.6 93.2  PLT 202 196 235 217 242 409    Basic Metabolic Panel: Recent Labs  Lab 05/16/21 0155 05/16/21 1639 05/17/21 0726 05/18/21 1008 05/19/21 0131 05/20/21 0949  NA 133* 132* 130* 131* 132* 135  K 3.3* 3.6 4.0 3.3* 3.5 3.6  CL 98 95* 97* 97* 97* 99  CO2 21* 26 23 25 27 28   GLUCOSE 68* 199* 108* 112* 103* 109*  BUN <5* <5* <5* <5* <5* <5*  CREATININE 0.74 0.91 0.88 0.83 0.87 0.92  CALCIUM 8.2* 8.4* 8.4* 8.4* 8.7* 8.6*  MG 1.1* 1.9 1.8 1.5* 2.0  --     GFR: Estimated Creatinine Clearance: 74.5 mL/min (by C-G formula based on SCr of 0.92 mg/dL). Liver Function  Tests: Recent Labs  Lab 05/14/21 0630  AST 17  ALT 5  ALKPHOS 80  BILITOT 1.1  PROT 5.2*  ALBUMIN 2.5*    No results for input(s): LIPASE, AMYLASE in the last 168 hours. Recent Labs  Lab 05/15/21 1315  AMMONIA 21    Coagulation Profile: No results for input(s): INR, PROTIME in the last 168 hours. Cardiac Enzymes: No results for input(s): CKTOTAL, CKMB, CKMBINDEX, TROPONINI in the last 168 hours. BNP (last 3 results) No results for input(s): PROBNP in the last 8760 hours. HbA1C: No results for input(s): HGBA1C in the last 72 hours. CBG: Recent Labs  Lab 05/19/21 1701 05/19/21 2111 05/20/21 0849 05/20/21 1215 05/20/21  [place] 114* 139*    Lipid Profile: Recent Labs    05/18/21 1903  CHOL 201*  HDL 32*  LDLCALC 143*  TRIG 131  CHOLHDL 6.3    Thyroid Function Tests: No results for input(s): TSH, T4TOTAL, FREET4, T3FREE, THYROIDAB in the last 72 hours.  Anemia Panel: No results for input(s): VITAMINB12, FOLATE, FERRITIN, TIBC, IRON, RETICCTPCT in the last 72 hours.  Sepsis Labs: Recent Labs  Lab 05/13/21 2025 05/14/21 0000 05/14/21 0630 05/15/21 1228  PROCALCITON  --  0.11 0.21 <0.10  LATICACIDVEN 1.4  --   --   --      Recent Results (from the past 240 hour(s))  Blood Culture (routine x 2)     Status: None   Collection Time: 05/13/21  5:13 PM   Specimen: BLOOD  Result Value Ref Range Status   Specimen Description BLOOD LEFT ANTECUBITAL  Final   Special Requests   Final    BOTTLES DRAWN AEROBIC AND ANAEROBIC Blood Culture results may not be optimal due to an inadequate volume of blood received in culture bottles   Culture   Final    NO GROWTH 5 DAYS Performed at [facility], [place] [address] [place],  [address]    Report Status 05/18/2021 FINAL  Final  Resp Panel by RT-PCR (Flu A&B, Covid) Nasopharyngeal Swab     Status: None   Collection Time: 05/13/21  5:22 PM   Specimen: Nasopharyngeal Swab;  Nasopharyngeal(NP) swabs in vial transport medium  Result Value Ref Range Status   SARS Coronavirus 2 by RT PCR NEGATIVE NEGATIVE Final    Comment: (NOTE) SARS-CoV-2 target nucleic acids are NOT DETECTED.  The SARS-CoV-2 RNA is generally detectable in upper respiratory specimens during the acute phase of infection. The lowest concentration of SARS-CoV-2 viral copies this assay can detect is 138 copies/mL. A negative result does not preclude SARS-Cov-2 infection and should not be used as the sole basis for treatment or other patient management decisions. A negative result may occur with  improper specimen collection/handling, submission of specimen other than nasopharyngeal swab, presence of viral mutation(s) within the areas targeted by this assay, and inadequate number of viral copies(<138 copies/mL). A negative result must be combined with clinical observations, patient history, and epidemiological information. The expected result is Negative.  Fact Sheet for Patients:  EntrepreneurPulse.com.au  Fact Sheet for Healthcare Providers:  IncredibleEmployment.be  This test is no t yet approved or cleared by the [place] FDA and  has been authorized for detection and/or diagnosis of SARS-CoV-2 by FDA under an Emergency Use Authorization (EUA). This EUA will remain  in effect (meaning this test can be used) for the duration of the COVID-19 declaration under Section 564(b)(1) of the Act, 21 U.S.C.section 360bbb-3(b)(1), unless the authorization is terminated  or revoked sooner.       Influenza A by PCR NEGATIVE NEGATIVE Final   Influenza B by PCR NEGATIVE NEGATIVE Final    Comment: (NOTE) The Xpert Xpress SARS-CoV-2/FLU/RSV plus assay is intended as an aid in the diagnosis of influenza from Nasopharyngeal swab specimens and should not be used as a sole basis for treatment. Nasal washings and aspirates are unacceptable for Xpert Xpress  SARS-CoV-2/FLU/RSV testing.  Fact Sheet for Patients: EntrepreneurPulse.com.au  Fact Sheet for Healthcare Providers: IncredibleEmployment.be  This test is not yet approved or cleared by the [place] FDA and has been authorized for detection and/or diagnosis of SARS-CoV-2 by FDA under an Emergency Use  Authorization (EUA). This EUA will remain in effect (meaning this test can be used) for the duration of the COVID-19 declaration under Section 564(b)(1) of the Act, 21 U.S.C. section 360bbb-3(b)(1), unless the authorization is terminated or revoked.  Performed at [facility], [place] [address] [place], [place] [address]   Blood Culture (routine x 2)     Status: None   Collection Time: 05/13/21  5:39 PM   Specimen: BLOOD  Result Value Ref Range Status   Specimen Description BLOOD RIGHT ANTECUBITAL  Final   Special Requests   Final    BOTTLES DRAWN AEROBIC AND ANAEROBIC Blood Culture results may not be optimal due to an inadequate volume of blood received in culture bottles   Culture   Final    NO GROWTH 5 DAYS Performed at [facility], [place] [address], [place], [place] [address]    Report Status 05/18/2021 FINAL  Final  Urine Culture     Status: None   Collection Time: 05/15/21  1:16 PM   Specimen: Urine, Clean Catch  Result Value Ref Range Status   Specimen Description URINE, CLEAN CATCH  Final   Special Requests NONE  Final   Culture   Final    NO GROWTH Performed at [facility], [place] [address] [place], [place] [address]    Report Status 05/16/2021 FINAL  Final  MRSA Next Gen by PCR, Nasal     Status: None   Collection Time: 05/15/21  3:54 PM   Specimen: Nasal Mucosa; Nasal Swab  Result Value Ref Range Status   MRSA by PCR Next Gen NOT DETECTED NOT DETECTED Final    Comment: (NOTE) The GeneXpert MRSA Assay (FDA approved for NASAL specimens only), is one component of a comprehensive MRSA colonization  surveillance program. It is not intended to diagnose MRSA infection nor to guide or monitor treatment for MRSA infections. Test performance is not FDA approved in patients less than 72 years old. Performed at [facility], [place] [address] [place], [place] [address]           Radiology Studies: CT ANGIO HEAD W OR WO CONTRAST  Result Date: 05/18/2021 CLINICAL DATA:  Stroke/TIA, assess extracranial arteries; Stroke/TIA, assess intracranial arteries EXAM: CT ANGIOGRAPHY HEAD AND NECK TECHNIQUE: Multidetector CT imaging of the head and neck was performed using the standard protocol during bolus administration of intravenous contrast. Multiplanar CT image reconstructions and MIPs were obtained to evaluate the vascular anatomy. Carotid stenosis measurements (when applicable) are obtained utilizing NASCET criteria, using the distal internal carotid diameter as the denominator. CONTRAST:  65mL OMNIPAQUE IOHEXOL 350 MG/ML SOLN COMPARISON:  None. FINDINGS: CT HEAD FINDINGS Brain: There is no mass, hemorrhage or extra-axial collection. There is generalized atrophy without lobar predilection. There is an old left cerebellar small vessel infarct. There is hypoattenuation of the periventricular white matter, most commonly indicating chronic ischemic microangiopathy. Skull: The visualized skull base, calvarium and extracranial soft tissues are normal. Sinuses/Orbits: No fluid levels or advanced mucosal thickening of the visualized paranasal sinuses. No mastoid or middle ear effusion. The orbits are normal. CTA NECK FINDINGS SKELETON: There is no bony spinal canal stenosis. No lytic or blastic lesion. OTHER NECK: Normal pharynx, larynx and major salivary glands. No cervical lymphadenopathy. Unremarkable thyroid gland. UPPER CHEST: No pneumothorax or pleural effusion. No nodules or masses. AORTIC ARCH: There is calcific atherosclerosis of the aortic arch. There is no aneurysm, dissection or hemodynamically  significant stenosis of the visualized  portion of the aorta. Conventional 3 vessel aortic branching pattern. The visualized proximal subclavian arteries are widely patent. RIGHT CAROTID SYSTEM: No dissection, occlusion or aneurysm. Mild atherosclerotic calcification at the carotid bifurcation without hemodynamically significant stenosis. LEFT CAROTID SYSTEM: No dissection, occlusion or aneurysm. Mild atherosclerotic calcification at the carotid bifurcation without hemodynamically significant stenosis. VERTEBRAL ARTERIES: Left dominant configuration. Both origins are clearly patent. There is no dissection, occlusion or flow-limiting stenosis to the skull base (V1-V3 segments). CTA HEAD FINDINGS CTA head images are degraded by motion. POSTERIOR CIRCULATION: --Vertebral arteries: Normal V4 segments. --Inferior cerebellar arteries: Normal. --Basilar artery: Normal. --Superior cerebellar arteries: Normal. --Posterior cerebral arteries (PCA): Normal. ANTERIOR CIRCULATION: --Intracranial internal carotid arteries: Normal. --Anterior cerebral arteries (ACA): Normal. Both A1 segments are present. Patent anterior communicating artery (a-comm). --Middle cerebral arteries (MCA): Normal. VENOUS SINUSES: As permitted by contrast timing, patent. ANATOMIC VARIANTS: None Review of the MIP images confirms the above findings. IMPRESSION: 1. No emergent large vessel occlusion or high-grade stenosis of the head or neck. 2. 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 Electronically Signed   By: Ulyses Jarred M.D.   On: 05/18/2021 20:37   CT ANGIO NECK W OR WO CONTRAST  Result Date: 05/18/2021 CLINICAL DATA:  Stroke/TIA, assess extracranial arteries; Stroke/TIA, assess intracranial arteries EXAM: CT ANGIOGRAPHY HEAD AND NECK TECHNIQUE: Multidetector CT imaging of the head and neck was performed using the standard protocol during bolus administration of intravenous contrast. Multiplanar CT image reconstructions and MIPs were obtained to  evaluate the vascular anatomy. Carotid stenosis measurements (when applicable) are obtained utilizing NASCET criteria, using the distal internal carotid diameter as the denominator. CONTRAST:  60mL OMNIPAQUE IOHEXOL 350 MG/ML SOLN COMPARISON:  None. FINDINGS: CT HEAD FINDINGS Brain: There is no mass, hemorrhage or extra-axial collection. There is generalized atrophy without lobar predilection. There is an old left cerebellar small vessel infarct. There is hypoattenuation of the periventricular white matter, most commonly indicating chronic ischemic microangiopathy. Skull: The visualized skull base, calvarium and extracranial soft tissues are normal. Sinuses/Orbits: No fluid levels or advanced mucosal thickening of the visualized paranasal sinuses. No mastoid or middle ear effusion. The orbits are normal. CTA NECK FINDINGS SKELETON: There is no bony spinal canal stenosis. No lytic or blastic lesion. OTHER NECK: Normal pharynx, larynx and major salivary glands. No cervical lymphadenopathy. Unremarkable thyroid gland. UPPER CHEST: No pneumothorax or pleural effusion. No nodules or masses. AORTIC ARCH: There is calcific atherosclerosis of the aortic arch. There is no aneurysm, dissection or hemodynamically significant stenosis of the visualized portion of the aorta. Conventional 3 vessel aortic branching pattern. The visualized proximal subclavian arteries are widely patent. RIGHT CAROTID SYSTEM: No dissection, occlusion or aneurysm. Mild atherosclerotic calcification at the carotid bifurcation without hemodynamically significant stenosis. LEFT CAROTID SYSTEM: No dissection, occlusion or aneurysm. Mild atherosclerotic calcification at the carotid bifurcation without hemodynamically significant stenosis. VERTEBRAL ARTERIES: Left dominant configuration. Both origins are clearly patent. There is no dissection, occlusion or flow-limiting stenosis to the skull base (V1-V3 segments). CTA HEAD FINDINGS CTA head images are  degraded by motion. POSTERIOR CIRCULATION: --Vertebral arteries: Normal V4 segments. --Inferior cerebellar arteries: Normal. --Basilar artery: Normal. --Superior cerebellar arteries: Normal. --Posterior cerebral arteries (PCA): Normal. ANTERIOR CIRCULATION: --Intracranial internal carotid arteries: Normal. --Anterior cerebral arteries (ACA): Normal. Both A1 segments are present. Patent anterior communicating artery (a-comm). --Middle cerebral arteries (MCA): Normal. VENOUS SINUSES: As permitted by contrast timing, patent. ANATOMIC VARIANTS: None Review of the MIP images confirms the above findings. IMPRESSION: 1. No emergent large vessel  occlusion or high-grade stenosis of the head or neck. 2. 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 Electronically Signed   By: Ulyses Jarred M.D.   On: 05/18/2021 20:37        Scheduled Meds:  adenosine (ADENOCARD) IV  6 mg Intravenous Once   amLODipine  5 mg Oral Daily   atorvastatin  40 mg Oral QHS   clotrimazole   Topical BID   enoxaparin (LOVENOX) injection  40 mg Subcutaneous Q24H   fluticasone furoate-vilanterol  1 puff Inhalation Daily   insulin aspart  0-15 Units Subcutaneous TID WC   insulin aspart  0-5 Units Subcutaneous QHS   metoprolol tartrate  2.5 mg Intravenous Q8H   nystatin  5 mL Oral QID   Continuous Infusions:  amiodarone 60 mg/hr (05/20/21 1306)   amiodarone     dextrose 5 % and 0.9% NaCl 75 mL/hr at 05/20/21 0648     LOS: 7 days    Time spent: 35 minutes    Orian Figueira A Chinenye Katzenberger, MD [facility]   If 7PM-7AM, please contact night-coverage [email or web address]  05/20/2021, 6:03 PM

## 2021-05-20 NOTE — Progress Notes (Addendum)
Progress Note  Patient Name: Michael Hines Date of Encounter: 05/20/2021  [facility] Cardiologist: [facility]  Subjective   Cardiology initially signed off yesterday but was called back to see patient urgently today given persistent SVT with rates in the 170s. I went to bedside to see patient with Dr. Sallyanne Kuster. Patient still very disoriented and somnolent. He would barely open his eyes but did not appear to be in any acute distress. However, he would become more alert with deep carotid message. BP remained soft after IV Lopressor so decision was made to start IV Amiodarone with significant improvement.   Inpatient Medications    Scheduled Meds:  adenosine (ADENOCARD) IV  6 mg Intravenous Once   amLODipine  5 mg Oral Daily   atorvastatin  40 mg Oral QHS   clotrimazole   Topical BID   enoxaparin (LOVENOX) injection  40 mg Subcutaneous Q24H   fluticasone furoate-vilanterol  1 puff Inhalation Daily   insulin aspart  0-15 Units Subcutaneous TID WC   insulin aspart  0-5 Units Subcutaneous QHS   metoprolol tartrate  2.5 mg Intravenous Q6H   nystatin  5 mL Oral QID   Continuous Infusions:  amiodarone     amiodarone     dextrose 5 % and 0.9% NaCl 75 mL/hr at 05/20/21 0648   magnesium sulfate bolus IVPB 2 g (05/20/21 1149)   PRN Meds: acetaminophen **OR** acetaminophen, ipratropium-albuterol, ondansetron **OR** ondansetron (ZOFRAN) IV   Vital Signs    Vitals:   05/20/21 0844 05/20/21 1029 05/20/21 1050 05/20/21 1100  BP: (!) 164/77 (!) 143/99  125/73  Pulse: (!) 48 (!) 168 (!) 177 82  Resp: 19 (!) 24 (!) 25 (!) 25  Temp: 97.6 F (36.4 C)     TempSrc: Axillary     SpO2: 96% 93% 93% 95%  Weight:      Height:        Intake/Output Summary (Last 24 hours) at 05/20/2021 1201 Last data filed at 05/20/2021 0851 Gross per 24 hour  Intake 2135.56 ml  Output 1250 ml  Net 885.56 ml   Last 3 Weights 05/20/2021 05/19/2021 05/18/2021  Weight (lbs) 187 lb 9.8 oz 187 lb  6.3 oz 186 lb 11.7 oz  Weight (kg) 85.1 kg 85 kg 84.7 kg      Telemetry    SVT with rates as high as 170. Patient would briefly break into sinus rhythm and then go back into SVT. However, bradycardic in the 40s to 50s when in sinus rhythm. - Personally Reviewed  ECG    SVT, rate 172 bpm, with diffuse ST depression (likely rate related) - Personally Reviewed  Physical Exam   GEN: Somnolent. No acute distress.   Neck: No JVD. Cardiac: Tachycardic with regularly rhythm. No murmurs, rubs, or gallops appreciated. Respiratory: Clear to auscultation anteriorly.  GI: Soft, non-distended, and non-tender. MS: No lower extremity edema. No deformity. Skin: Warm and dry. Neuro:  Disoriented. No focal deficits. Psych: Somnolent.   Labs    High Sensitivity Troponin:   Recent Labs  Lab 05/16/21 1639 05/16/21 1830  TROPONINIHS 24* 21*     Chemistry Recent Labs  Lab 05/13/21 1654 05/14/21 0630 05/15/21 1225 05/17/21 0726 05/18/21 1008 05/19/21 0131 05/20/21 0949  NA 135 137   < > 130* 131* 132* 135  K 3.2* 3.7   < > 4.0 3.3* 3.5 3.6  CL 101 104   < > 97* 97* 97* 99  CO2 18* 21*   < >  23 25 27 28   GLUCOSE 132* 99   < > 108* 112* 103* 109*  BUN 8 8   < > <5* <5* <5* <5*  CREATININE 1.67* 1.13   < > 0.88 0.83 0.87 0.92  CALCIUM 8.8* 8.2*   < > 8.4* 8.4* 8.7* 8.6*  MG  --   --    < > 1.8 1.5* 2.0  --   PROT 6.3* 5.2*  --   --   --   --   --   ALBUMIN 2.9* 2.5*  --   --   --   --   --   AST 21 17  --   --   --   --   --   ALT <5 5  --   --   --   --   --   ALKPHOS 118 80  --   --   --   --   --   BILITOT 1.3* 1.1  --   --   --   --   --   GFRNONAA 43* >60   < > >60 >60 >60 >60  ANIONGAP 16* 12   < > 10 9 8 8    < > = values in this interval not displayed.    Lipids  Recent Labs  Lab 05/18/21 1903  CHOL 201*  TRIG 131  HDL 32*  LDLCALC 143*  CHOLHDL 6.3    Hematology Recent Labs  Lab 05/18/21 1008 05/19/21 0131 05/20/21 0949  WBC 9.0 7.6 8.6  RBC 4.76 5.02 4.74   HGB 14.4 15.1 14.6  HCT 43.5 46.5 44.2  MCV 91.4 92.6 93.2  MCH 30.3 30.1 30.8  MCHC 33.1 32.5 33.0  RDW 14.3 14.6 14.4  PLT 217 242 238   Thyroid  Recent Labs  Lab 05/15/21 1227  TSH 1.957    BNPNo results for input(s): BNP, PROBNP in the last 168 hours.  DDimer  Recent Labs  Lab 05/13/21 2025  DDIMER 1.20*     Radiology    CT ANGIO HEAD W OR WO CONTRAST  Result Date: 05/18/2021 CLINICAL DATA:  Stroke/TIA, assess extracranial arteries; Stroke/TIA, assess intracranial arteries EXAM: CT ANGIOGRAPHY HEAD AND NECK TECHNIQUE: Multidetector CT imaging of the head and neck was performed using the standard protocol during bolus administration of intravenous contrast. Multiplanar CT image reconstructions and MIPs were obtained to evaluate the vascular anatomy. Carotid stenosis measurements (when applicable) are obtained utilizing NASCET criteria, using the distal internal carotid diameter as the denominator. CONTRAST:  17mL OMNIPAQUE IOHEXOL 350 MG/ML SOLN COMPARISON:  None. FINDINGS: CT HEAD FINDINGS Brain: There is no mass, hemorrhage or extra-axial collection. There is generalized atrophy without lobar predilection. There is an old left cerebellar small vessel infarct. There is hypoattenuation of the periventricular white matter, most commonly indicating chronic ischemic microangiopathy. Skull: The visualized skull base, calvarium and extracranial soft tissues are normal. Sinuses/Orbits: No fluid levels or advanced mucosal thickening of the visualized paranasal sinuses. No mastoid or middle ear effusion. The orbits are normal. CTA NECK FINDINGS SKELETON: There is no bony spinal canal stenosis. No lytic or blastic lesion. OTHER NECK: Normal pharynx, larynx and major salivary glands. No cervical lymphadenopathy. Unremarkable thyroid gland. UPPER CHEST: No pneumothorax or pleural effusion. No nodules or masses. AORTIC ARCH: There is calcific atherosclerosis of the aortic arch. There is no  aneurysm, dissection or hemodynamically significant stenosis of the visualized portion of the aorta. Conventional 3 vessel aortic branching pattern. The visualized proximal  subclavian arteries are widely patent. RIGHT CAROTID SYSTEM: No dissection, occlusion or aneurysm. Mild atherosclerotic calcification at the carotid bifurcation without hemodynamically significant stenosis. LEFT CAROTID SYSTEM: No dissection, occlusion or aneurysm. Mild atherosclerotic calcification at the carotid bifurcation without hemodynamically significant stenosis. VERTEBRAL ARTERIES: Left dominant configuration. Both origins are clearly patent. There is no dissection, occlusion or flow-limiting stenosis to the skull base (V1-V3 segments). CTA HEAD FINDINGS CTA head images are degraded by motion. POSTERIOR CIRCULATION: --Vertebral arteries: Normal V4 segments. --Inferior cerebellar arteries: Normal. --Basilar artery: Normal. --Superior cerebellar arteries: Normal. --Posterior cerebral arteries (PCA): Normal. ANTERIOR CIRCULATION: --Intracranial internal carotid arteries: Normal. --Anterior cerebral arteries (ACA): Normal. Both A1 segments are present. Patent anterior communicating artery (a-comm). --Middle cerebral arteries (MCA): Normal. VENOUS SINUSES: As permitted by contrast timing, patent. ANATOMIC VARIANTS: None Review of the MIP images confirms the above findings. IMPRESSION: 1. No emergent large vessel occlusion or high-grade stenosis of the head or neck. 2. 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 Electronically Signed   By: Ulyses Jarred M.D.   On: 05/18/2021 20:37   CT ANGIO NECK W OR WO CONTRAST  Result Date: 05/18/2021 CLINICAL DATA:  Stroke/TIA, assess extracranial arteries; Stroke/TIA, assess intracranial arteries EXAM: CT ANGIOGRAPHY HEAD AND NECK TECHNIQUE: Multidetector CT imaging of the head and neck was performed using the standard protocol during bolus administration of intravenous contrast. Multiplanar CT image  reconstructions and MIPs were obtained to evaluate the vascular anatomy. Carotid stenosis measurements (when applicable) are obtained utilizing NASCET criteria, using the distal internal carotid diameter as the denominator. CONTRAST:  51mL OMNIPAQUE IOHEXOL 350 MG/ML SOLN COMPARISON:  None. FINDINGS: CT HEAD FINDINGS Brain: There is no mass, hemorrhage or extra-axial collection. There is generalized atrophy without lobar predilection. There is an old left cerebellar small vessel infarct. There is hypoattenuation of the periventricular white matter, most commonly indicating chronic ischemic microangiopathy. Skull: The visualized skull base, calvarium and extracranial soft tissues are normal. Sinuses/Orbits: No fluid levels or advanced mucosal thickening of the visualized paranasal sinuses. No mastoid or middle ear effusion. The orbits are normal. CTA NECK FINDINGS SKELETON: There is no bony spinal canal stenosis. No lytic or blastic lesion. OTHER NECK: Normal pharynx, larynx and major salivary glands. No cervical lymphadenopathy. Unremarkable thyroid gland. UPPER CHEST: No pneumothorax or pleural effusion. No nodules or masses. AORTIC ARCH: There is calcific atherosclerosis of the aortic arch. There is no aneurysm, dissection or hemodynamically significant stenosis of the visualized portion of the aorta. Conventional 3 vessel aortic branching pattern. The visualized proximal subclavian arteries are widely patent. RIGHT CAROTID SYSTEM: No dissection, occlusion or aneurysm. Mild atherosclerotic calcification at the carotid bifurcation without hemodynamically significant stenosis. LEFT CAROTID SYSTEM: No dissection, occlusion or aneurysm. Mild atherosclerotic calcification at the carotid bifurcation without hemodynamically significant stenosis. VERTEBRAL ARTERIES: Left dominant configuration. Both origins are clearly patent. There is no dissection, occlusion or flow-limiting stenosis to the skull base (V1-V3 segments).  CTA HEAD FINDINGS CTA head images are degraded by motion. POSTERIOR CIRCULATION: --Vertebral arteries: Normal V4 segments. --Inferior cerebellar arteries: Normal. --Basilar artery: Normal. --Superior cerebellar arteries: Normal. --Posterior cerebral arteries (PCA): Normal. ANTERIOR CIRCULATION: --Intracranial internal carotid arteries: Normal. --Anterior cerebral arteries (ACA): Normal. Both A1 segments are present. Patent anterior communicating artery (a-comm). --Middle cerebral arteries (MCA): Normal. VENOUS SINUSES: As permitted by contrast timing, patent. ANATOMIC VARIANTS: None Review of the MIP images confirms the above findings. IMPRESSION: 1. No emergent large vessel occlusion or high-grade stenosis of the head or neck. 2. 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 Electronically Signed   By: Ulyses Jarred M.D.   On: 05/18/2021 20:37   MR BRAIN WO CONTRAST  Result Date: 05/18/2021 CLINICAL DATA:  Neuro deficit, acute, stroke suspected. EXAM: MRI HEAD WITHOUT CONTRAST TECHNIQUE: Multiplanar, multiecho pulse sequences of the brain and surrounding structures were obtained without intravenous contrast. COMPARISON:  Brain MRI 05/17/2021. Brain MRI 03/07/2020. Head CT 04/12/2021. FINDINGS: Brain: Intermittently motion degraded examination, limiting evaluation. Most notably, there is moderate to moderately severe motion degradation of the axial T2 FLAIR sequence, moderate motion degradation of the axial T1 weighted sequence and severe motion degradation of the coronal T2 TSE sequence. Redemonstrated central predominant cerebral atrophy. Mild cerebellar atrophy is also present. Apparent punctate focus of cortical diffusion weighted hyperintensity within the anterior right frontal lobe, appreciated on the axial diffusion-weighted sequence only (series 5, image 82). This may reflect a punctate acute infarct or artifact. Mild multifocal T2 FLAIR hyperintense signal abnormality within the cerebral white matter and  pons, nonspecific but compatible with chronic small vessel ischemic disease. Chronic lacunar infarct within the inferior left cerebellar hemisphere. No evidence of an intracranial mass. No chronic intracranial blood products. No extra-axial fluid collection. No midline shift. Vascular: Maintained flow voids within the proximal large arterial vessels. Skull and upper cervical spine: No focal suspicious marrow lesion. Sinuses/Orbits: Visualized orbits show no acute finding. Right lens replacement. No significant paranasal sinus disease at the imaged levels. Other: Trace fluid within the bilateral mastoid air cells. IMPRESSION: Intermittently motion degraded examination, as described and limiting evaluation. Punctate acute cortical infarct versus artifact within the anterior right frontal lobe. Mild chronic small vessel ischemic changes within the cerebral white matter and pons, similar as compared to the brain MRI of 02/06/2020. 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 Central predominant cerebral atrophy. Mild cerebellar atrophy is also present. Trace fluid within the bilateral mastoid air cells. Electronically Signed   By: Kellie Simmering D.O.   On: 05/18/2021 17:35    Cardiac Studies   TEE 04/18/2021: Impressions:  1. 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 V max 2.6 m/s, MG 15 mmHG,  EOA 1.87 cm2, DI 0.54. No definite evidence of vegetation. The leaflets  appear thickened and there is mild central regurgitation. Suspect the  regurgitation is due to valve  deterioration given the age of the valve and thickened leaflet appearance.  No definitive evidence of aortic root abscess. Would recommend close echo  follow-up after completion of antibiotics but no definitive evidence of  endocarditis. The aortic valve  has been repaired/replaced. Aortic valve regurgitation is mild. There is a  21 mm bioprosthetic valve present in the aortic position. Procedure Date:   07/18/2010.   2. Left ventricular ejection fraction, by estimation, is 60 to 65%. The  left ventricle has normal function.   3. Right ventricular systolic function is normal. The right ventricular  size is normal.   4. No left atrial/left atrial appendage thrombus was detected.   5. The mitral valve is degenerative. Mild mitral valve regurgitation. No  evidence of mitral stenosis.   6. Aneurysm of the ascending aorta, measuring 45 mm. There is Moderate  (Grade III) layered plaque involving the descending aorta.   Conclusion(s)/Recommendation(s): No evidence of vegetation/infective  endocarditis on this transesophageal echocardiogram. _______________  Limited TTE 05/17/2021: Impressions: 1. Left ventricular ejection fraction, by estimation, is 60 to 65%. The  left ventricle has normal function. The left ventricle has no regional  wall motion abnormalities. Left ventricular diastolic  parameters are  consistent with Grade I diastolic  dysfunction (impaired relaxation).   2. Right ventricular systolic function is normal. The right ventricular  size is normal.   3. The mitral valve is normal in structure. No evidence of mitral valve  regurgitation. No evidence of mitral stenosis.   4. The aortic valve has been repaired/replaced. Aortic valve  regurgitation is not visualized. No aortic stenosis is present. There is a  21 mm bioprosthetic valve present in the aortic position. Aortic valve  mean gradient measures 10.0 mmHg. Aortic valve   Vmax measures 2.20 m/s.   5. The inferior vena cava is normal in size with greater than 50%  respiratory variability, suggesting right atrial pressure of 3 mmHg.   Comparison(s): No significant change from prior study. Prior images  reviewed side by side.    Patient Profile     75 y.o. male with a history of bioprosthetic aortic valve s/p AVR in 07/2010 and then redo AVR in 07/2012, thoracic aortic aneurysm, hypertension, hyperlipidemia, type 2  diabetes mellitus who was recently admitted in 04/2021 with MSSA bacteremia (unclear source). TEE at that time showed no evidence of endocarditis. He was treated with antibiotics and discharged to SNF on 04/19/2021. Readmitted on 05/13/2021 with sepsis after presenting with hypotension and hypothermia. Blood cultures have been negative. Cardiology following for intermittent SVT.  Assessment & Plan    Paroxysmal SVT - Patient has had intermittent SVT this admission complicated by the fact that patient is disoriented/confused and frequently refusing oral medications. Also complicated by bradycardia int he 40s to 50s when in sinus rhythm. Patient had persistent SVT this morning with rates in the 170s. He would break very briefly and go back into sinus rhythm and then go back into SVT. BP soft with systolic BP in the low 74J. Therefore, he was started on IV Amiodarone with restoration of sinus rhythm.  - Potassium 3.6 today. Magnesium 2.0 yesterday. - Continue IV Amiodarone for now. - Will continue IV Lopressor 2.5mg  every 6 hours for now as well. However, if he continues to have bradycardia this can be stopped. - Would ideally not be on Amiodarone long-term. Once patient recovers from current illness, may be a good SVT ablation candidate. We have asked EP to see.  Bicuspid Aortic Valve - S/p AVR in 07/2010 and redo AVR in 07/2012.  - Recent TEE on 04/18/2021 showed no definitive evidence of endocarditis but did note mild AI. Patient was readmitted with sepsis this admission but blood cultures negative. Limited TTE showed normal function with no AS/AI. Mean gradient 81mmHg. - Patient is still confused. Even though blood cultures were negative, there is still concern for possible endocarditis as cause of sepsis. Will repeat TEE on Wednesday. Discussed with son Raylon Lamson and sister Oluwatomisin Deman who both agreed with this.   Shared Decision Making/Informed Consent{ The risks [esophageal damage,  perforation (1:10,000 risk), bleeding, pharyngeal hematoma as well as other potential complications associated with conscious sedation including aspiration, arrhythmia, respiratory failure and death], benefits (treatment guidance and diagnostic support) and alternatives of a transesophageal echocardiogram were discussed in detail with Mr. Carrero and he is willing to proceed.    Otherwise, per primary team: - Sepsis - Type 2 diabetes mellitus - Chronic low back pain - Parkinsonism  - Oral thrush - Acute metabolic encephalopathy  For questions or updates, please contact [facility] Please consult [email or web address] for contact info under        Signed, Darreld Mclean, PA-C  05/20/2021, 12:01 PM    I have seen and examined the patient along with Darreld Mclean, PA-C .  I have reviewed the chart, notes and new data.  I agree with PA/NP's note.  Key new complaints: He is groggy, but easy to awake and responds appropriately to questions.  He denies chest pain and shortness of breath.  He is lying fully supine in bed. Key examination changes: Tachycardia, 1-2/6 aortic ejection murmur, warm extremities, clear lungs, unable to evaluate JVD Key new findings / data: Incessant episodes of narrow complex tachycardia with short RP mechanism.  Episodes are sustained for several minutes at a time, briefly breaking to sinus rhythm at about 70 bpm before returning to SVT.  SVT onset is frequently triggered by a "long -short" sequence, strongly suggestive of AV node reentry.  During normal sinus rhythm the blood pressure is approximately 140/80.  During SVT the blood pressure is 90/60.  The patient does not appear to be aware of the rhythm changes.  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 Since hypotension precludes additional doses of beta-blocker, will start IV amiodarone at least for the time being. Ideally, he will eventually undergo AV node modification/RF ablation, since otherwise we  will continue to have problems with bradycardia due to necessary medications for control of the SVT.  However at this point he remains too sick for an elective EP procedure. I am concerned that he may have endocarditis, not withstanding the negative TEE a month ago.  He had staph aureus bacteremia and now after completion of 4 weeks of intravenous antibiotics he has presented with altered mental status, hypothermia, hypotension and hypoxia, suggestive of an infection.  Recommend repeat blood cultures and TEE to reevaluate the prosthetic aortic valve.  Consent was obtained from the patient's sister and son, since the patient is unable to really understand the risk/benefit discussion.  Sanda Klein, MD, [place] [phone] 05/20/2021, 1:32 PM

## 2021-05-20 NOTE — Significant Event (Signed)
Rapid Response Event Note   Reason for Call :  SVT HR up to 180 bpm  Initial Focused Assessment:  Pt lying in bed. Drowsy, moving all extremities. Skin is warm, dry, pale. Pt endorses not feeling well. Radial pulses are strong, fast.   HR is 170-185 bpm with intermittent conversion to sinus rhythm in 60-75 bpm.   Pt unable to perform valsalva maneuver. Pt does not tolerate carotid massage due to topical pain.   VS: T 97.30F, BP 143/99, HR 177, RR 24, SpO2 93% on 3LNC  Interventions:  -EKG -150mg  IV Amiodarone bolus with infusion -1L IVF bolus  Plan of Care:  -Amiodarone gtt per order -Increase frequency of VS until gtt has been running x1 hour  Call rapid response for additional needs  Event Summary:  MD Notified: Dr. Tyrell Antonio Call Time: [place] Time: 1050 End Time: [place], RN

## 2021-05-21 ENCOUNTER — Inpatient Hospital Stay: Payer: Self-pay

## 2021-05-21 DIAGNOSIS — Z515 Encounter for palliative care: Secondary | ICD-10-CM

## 2021-05-21 DIAGNOSIS — E119 Type 2 diabetes mellitus without complications: Secondary | ICD-10-CM | POA: Diagnosis not present

## 2021-05-21 DIAGNOSIS — Z952 Presence of prosthetic heart valve: Secondary | ICD-10-CM | POA: Diagnosis not present

## 2021-05-21 DIAGNOSIS — G9340 Encephalopathy, unspecified: Secondary | ICD-10-CM

## 2021-05-21 DIAGNOSIS — I471 Supraventricular tachycardia: Secondary | ICD-10-CM

## 2021-05-21 DIAGNOSIS — M545 Low back pain, unspecified: Secondary | ICD-10-CM | POA: Diagnosis not present

## 2021-05-21 DIAGNOSIS — Z7189 Other specified counseling: Secondary | ICD-10-CM

## 2021-05-21 DIAGNOSIS — A419 Sepsis, unspecified organism: Secondary | ICD-10-CM | POA: Diagnosis not present

## 2021-05-21 DIAGNOSIS — N179 Acute kidney failure, unspecified: Secondary | ICD-10-CM | POA: Diagnosis not present

## 2021-05-21 DIAGNOSIS — D72829 Elevated white blood cell count, unspecified: Secondary | ICD-10-CM

## 2021-05-21 DIAGNOSIS — R6520 Severe sepsis without septic shock: Secondary | ICD-10-CM | POA: Diagnosis not present

## 2021-05-21 LAB — CBC
HCT: 47.2 % (ref 39.0–52.0)
Hemoglobin: 15.6 g/dL (ref 13.0–17.0)
MCH: 30.4 pg (ref 26.0–34.0)
MCHC: 33.1 g/dL (ref 30.0–36.0)
MCV: 92 fL (ref 80.0–100.0)
Platelets: 251 10*3/uL (ref 150–400)
RBC: 5.13 MIL/uL (ref 4.22–5.81)
RDW: 14.6 % (ref 11.5–15.5)
WBC: 10.2 10*3/uL (ref 4.0–10.5)
nRBC: 0 % (ref 0.0–0.2)

## 2021-05-21 LAB — BASIC METABOLIC PANEL
Anion gap: 9 (ref 5–15)
BUN: <5 mg/dL — ABNORMAL LOW (ref 8–23)
CO2: 28 mmol/L (ref 22–32)
Calcium: 8.6 mg/dL — ABNORMAL LOW (ref 8.9–10.3)
Chloride: 99 mmol/L (ref 98–111)
Creatinine, Ser: 0.95 mg/dL (ref 0.61–1.24)
GFR, Estimated: >60 mL/min (ref >60)
Glucose, Bld: 120 mg/dL — ABNORMAL HIGH (ref 70–99)
Potassium: 3.5 mmol/L (ref 3.5–5.1)
Sodium: 136 mmol/L (ref 135–145)

## 2021-05-21 LAB — MAGNESIUM: Magnesium: 1.7 mg/dL (ref 1.7–2.4)

## 2021-05-21 LAB — GLUCOSE, CAPILLARY
Glucose-Capillary: 128 mg/dL — ABNORMAL HIGH (ref 70–99)
Glucose-Capillary: 116 mg/dL — ABNORMAL HIGH (ref 70–99)
Glucose-Capillary: 131 mg/dL — ABNORMAL HIGH (ref 70–99)
Glucose-Capillary: 96 mg/dL (ref 70–99)

## 2021-05-21 MED ORDER — SODIUM CHLORIDE 0.9% FLUSH
10.0000 mL | INTRAVENOUS | Status: DC | PRN
  Administered 2021-05-23 – 2021-05-24 (×2): 10 mL

## 2021-05-21 MED ORDER — SODIUM CHLORIDE 0.9 % IV SOLN: INTRAVENOUS | Status: DC

## 2021-05-21 MED ORDER — POTASSIUM CHLORIDE 20 MEQ PO PACK
40.0000 meq | PACK | Freq: Two times a day (BID) | ORAL | Status: AC
  Administered 2021-05-21: 40 meq via ORAL
  Filled 2021-05-21: qty 2

## 2021-05-21 MED ORDER — MAGNESIUM SULFATE 2 GM/50ML IV SOLN
2.0000 g | Freq: Once | INTRAVENOUS | Status: AC
  Administered 2021-05-21: 2 g via INTRAVENOUS
  Filled 2021-05-21: qty 50

## 2021-05-21 MED ORDER — LORAZEPAM 2 MG/ML IJ SOLN
1.0000 mg | Freq: Four times a day (QID) | INTRAMUSCULAR | Status: AC | PRN
  Administered 2021-05-22 – 2021-05-23 (×2): 1 mg via INTRAVENOUS
  Filled 2021-05-21 (×2): qty 1

## 2021-05-21 MED ORDER — SODIUM CHLORIDE 0.9% FLUSH
10.0000 mL | Freq: Two times a day (BID) | INTRAVENOUS | Status: DC
Start: 2021-05-21 — End: 2021-05-28
  Administered 2021-05-21 – 2021-05-28 (×13): 10 mL

## 2021-05-21 MED FILL — Medication: Qty: 1 | Status: AC

## 2021-05-21 NOTE — Progress Notes (Signed)
Midline placed for IV fluids. Kayla RN arrived to bedside. This instructed Kayla RN that Amiodarone is NOT to be run through the midline. Pink arm band applied along with a sticker at midline drsg site stating "NO AMIO to midline. Nurse changed tubing and connected IV fluids to new midline. Kayla RN VU of instructions. Fran Lowes, RN VAST

## 2021-05-21 NOTE — Anesthesia Preprocedure Evaluation (Addendum)
Anesthesia Evaluation  Patient identified by MRN, date of birth, ID band Patient awake and Patient confused    Reviewed: Allergy & Precautions, NPO status , Patient's Chart, lab work & pertinent test results  History of Anesthesia Complications Negative for: history of anesthetic complications  Airway Mallampati: II  TM Distance: >3 FB Neck ROM: Full    Dental  (+) Edentulous Upper, Missing,    Pulmonary COPD,  COPD inhaler, former smoker,    Pulmonary exam normal        Cardiovascular Normal cardiovascular exam  S/P AVR (redo in 2013)  TTE 05/17/21: EF 60-65%, grade I DD   Neuro/Psych Anxiety    GI/Hepatic negative GI ROS, Neg liver ROS,   Endo/Other  diabetes, Type 2, Insulin Dependent  Renal/GU negative Renal ROS  negative genitourinary   Musculoskeletal negative musculoskeletal ROS (+)   Abdominal   Peds  Hematology negative hematology ROS (+)   Anesthesia Other Findings Sepsis with unknown source  Reproductive/Obstetrics negative OB ROS                           Anesthesia Physical Anesthesia Plan  ASA: 4  Anesthesia Plan: MAC   Post-op Pain Management:    Induction:   PONV Risk Score and Plan: Treatment may vary due to age or medical condition and Propofol infusion  Airway Management Planned: Natural Airway and Nasal Cannula  Additional Equipment: None  Intra-op Plan:   Post-operative Plan:   Informed Consent: I have reviewed the patients History and Physical, chart, labs and discussed the procedure including the risks, benefits and alternatives for the proposed anesthesia with the patient or authorized representative who has indicated his/her understanding and acceptance.     Consent reviewed with POA  Plan Discussed with: CRNA  Anesthesia Plan Comments: (Consent obtained in preop with patient's wife present. Daiva Huge, MD)       Anesthesia Quick Evaluation

## 2021-05-21 NOTE — Progress Notes (Signed)
OT Cancellation Note  Patient Details Name: Michael Hines MRN: [id number] DOB: [birth year]   Cancelled Treatment:    Reason Eval/Treat Not Completed: Patient at procedure or test/ unavailable: Nursing working with pt in room. Requested for OT to return later as able.   Julien Girt 05/21/2021, 12:20 PM

## 2021-05-21 NOTE — Progress Notes (Signed)
Progress Note  Patient Name: Michael Hines Date of Encounter: 05/21/2021  [facility] Cardiologist: [facility] team  Subjective   Awake, remains confused, wife is sleeping at bedside.  She reported that he ate some yesterday, though not much  Inpatient Medications    Scheduled Meds:  amLODipine  5 mg Oral Daily   atorvastatin  40 mg Oral QHS   clotrimazole   Topical BID   enoxaparin (LOVENOX) injection  40 mg Subcutaneous Q24H   fluticasone furoate-vilanterol  1 puff Inhalation Daily   insulin aspart  0-15 Units Subcutaneous TID WC   insulin aspart  0-5 Units Subcutaneous QHS   metoprolol tartrate  2.5 mg Intravenous Q8H   nystatin  5 mL Oral QID   Continuous Infusions:  amiodarone 30 mg/hr (05/21/21 0726)   dextrose 5 % and 0.9% NaCl 75 mL/hr at 05/21/21 0001   PRN Meds: acetaminophen **OR** acetaminophen, ipratropium-albuterol, ondansetron **OR** ondansetron (ZOFRAN) IV   Vital Signs    Vitals:   05/21/21 0100 05/21/21 0355 05/21/21 0500 05/21/21 0530  BP: 135/84 (!) 153/90    Pulse: 68 85 (!) 58 85  Resp: 18 18 (!) 22 (!) 21  Temp:  98.4 F (36.9 C)    TempSrc:  Oral    SpO2: 97% 96% 94% 95%  Weight:   85.3 kg   Height:        Intake/Output Summary (Last 24 hours) at 05/21/2021 0735 Last data filed at 05/21/2021 0345 Gross per 24 hour  Intake 703.03 ml  Output 3000 ml  Net -2296.97 ml   Last 3 Weights 05/21/2021 05/20/2021 05/19/2021  Weight (lbs) 188 lb 0.8 oz 187 lb 9.8 oz 187 lb 6.3 oz  Weight (kg) 85.3 kg 85.1 kg 85 kg      Telemetry    SR, no recurrent SVT since yesterday AM's (prior to amiodarone) - Personally Reviewed  ECG    No new EKGs - Personally Reviewed  Physical Exam  Largely unchanged  GEN: No acute distress. Disheveled and chronically ill appearing, appears comfortable in bed.   Neck: No JVD Cardiac: RRR, sot SM, no rubs, or gallops.  Respiratory: CTA b/l. GI: Soft, nontender, non-distended  MS: No edema; No  deformity. Neuro:  no gross motor defects, confused throughout this admission so far Psych: pleasent   Labs    High Sensitivity Troponin:   Recent Labs  Lab 05/16/21 1639 05/16/21 1830  TROPONINIHS 24* 21*     Chemistry Recent Labs  Lab 05/17/21 0726 05/18/21 1008 05/19/21 0131 05/20/21 0949  NA 130* 131* 132* 135  K 4.0 3.3* 3.5 3.6  CL 97* 97* 97* 99  CO2 23 25 27 28   GLUCOSE 108* 112* 103* 109*  BUN <5* <5* <5* <5*  CREATININE 0.88 0.83 0.87 0.92  CALCIUM 8.4* 8.4* 8.7* 8.6*  MG 1.8 1.5* 2.0  --   GFRNONAA >60 >60 >60 >60  ANIONGAP 10 9 8 8     Lipids  Recent Labs  Lab 05/18/21 1903  CHOL 201*  TRIG 131  HDL 32*  LDLCALC 143*  CHOLHDL 6.3    Hematology Recent Labs  Lab 05/19/21 0131 05/20/21 0949 05/21/21 0621  WBC 7.6 8.6 10.2  RBC 5.02 4.74 5.13  HGB 15.1 14.6 15.6  HCT 46.5 44.2 47.2  MCV 92.6 93.2 92.0  MCH 30.1 30.8 30.4  MCHC 32.5 33.0 33.1  RDW 14.6 14.4 14.6  PLT 242 238 251   Thyroid  Recent Labs  Lab 05/15/21 1227  TSH 1.957    BNPNo results for input(s): BNP, PROBNP in the last 168 hours.  DDimer No results for input(s): DDIMER in the last 168 hours.   Radiology    No results found.  Cardiac Studies   05/17/21: TTE IMPRESSIONS   1. Left ventricular ejection fraction, by estimation, is 60 to 65%. The  left ventricle has normal function. The left ventricle has no regional  wall motion abnormalities. Left ventricular diastolic parameters are  consistent with Grade I diastolic  dysfunction (impaired relaxation).   2. Right ventricular systolic function is normal. The right ventricular  size is normal.   3. The mitral valve is normal in structure. No evidence of mitral valve  regurgitation. No evidence of mitral stenosis.   4. The aortic valve has been repaired/replaced. Aortic valve  regurgitation is not visualized. No aortic stenosis is present. There is a  21 mm bioprosthetic valve present in the aortic position. Aortic  valve  mean gradient measures 10.0 mmHg. Aortic valve   Vmax measures 2.20 m/s.   5. The inferior vena cava is normal in size with greater than 50%  respiratory variability, suggesting right atrial pressure of 3 mmHg.   Comparison(s): No significant change from prior study. Prior images  reviewed side by side.      04/18/21: TEE 21 mm Prosthetic aortic valve with signs of structural deterioration, but no signs of endocarditis. No signs of endocarditis.  Normal LV/RV function.   Full report to follow. Further management per primary team.      04/13/21; TTE IMPRESSIONS   1. Image quality is insufficient to exclude valvular vegetations.  Consider TEE if clinically indicated.   2. Left ventricular ejection fraction, by estimation, is 60 to 65%. The  left ventricle has normal function. Left ventricular endocardial border  not optimally defined to evaluate regional wall motion. There is mild left  ventricular hypertrophy. Left  ventricular diastolic parameters are consistent with Grade I diastolic  dysfunction (impaired relaxation).   3. Right ventricular systolic function is mildly reduced. The right  ventricular size is normal. Tricuspid regurgitation signal is inadequate  for assessing PA pressure.   4. The mitral valve is grossly normal. Trivial mitral valve  regurgitation. No evidence of mitral stenosis.   5. S/p redo surgical aortic valve replacement. Calculations most  consistent with patient prosthesis mismatch. EOA 1.16 cm2, iEOA 0.54, DI  0.37, AT 71 msec. . The aortic valve has been repaired/replaced. Aortic  valve regurgitation is trivial. There is a  21 mm pericardial valve present in the aortic position. Procedure Date:  Dec 2013. Aortic valve mean gradient measures 19.0 mmHg.   6. Aortic dilatation noted. There is mild dilatation of the ascending  aorta, measuring 44 mm.   7. The inferior vena cava is normal in size with greater than 50%  respiratory variability,  suggesting right atrial pressure of 3 mmHg.   Comparison(s): Compared to report of echo from 6/22 at outside hospital,  gradient through aortic valve is known and stable.   Patient Profile     75 y.o. male with a hx of smoker, COPD, DM, anxiety, VHD w/AVR (bioprosthetic 2013) Thoracic aortic aneurysm (follows with Dr. Kipp Brood), CBP,  Parkinson's disease admitted with acute confusion, hypotension, hypothermia, tachycardia, tachypnea with concerns of sepsis... eventually ruled out Developed SVT  Recent hospitalization with MSSA bacteremia   Assessment & Plan    SVT (short RP, suspect AVNRT) No known hx of the same No SVT since yesterday morning  Remains on amiodarone gtt Again, lopressor being held for nocturnal; bradycardia, I have written parameters Not an ablation candidate Keep amiodarone gtt for now     Medicine team and general cards:  MSSA bacteremia last month, TEE then neg for obvious endocarditis       ID on case         Completed 4wks abx       BC neg here       Cardiology on the case as well, plans for TEE tomorrow given prosthetic AVR  Acute metabolic encephalopathy Parkinson's disease > Sinemet on hold       Confused       In review of chart refusing meds and often food       Palliative on board, family meeting planned for today       ? etiology Hypoxemia       Suspect apnea/hypoventilation Functional decline       Poor/no oral intake       Oral thrush       Discussions on possible ? Feeding tube  For questions or updates, please contact [place] Please consult [email or web address] for contact info under        Signed, Baldwin [place], PA-C  05/21/2021, 7:35 AM

## 2021-05-21 NOTE — Progress Notes (Signed)
[facility] RN requested a call regarding infusing Amiodarone into midline. This nurse instructed her under NO circumstance should the Amiodarone be connected to the midline. Instructed to get MD to order PICC line or have MD place central line. Kayla RN VU. Fran Lowes RN VAST

## 2021-05-21 NOTE — Progress Notes (Signed)
PROGRESS NOTE    Michael Hines  XBD:[id number] DOB: 04-25-1946 DOA: 05/13/2021 PCP: Finis Bud, MD   Brief Narrative: 75 year old with past medical history significant for COPD, type 2 diabetes, hyperlipidemia, bioprosthetic aortic valve replacement and recent MSSA septicemia status post 4 weeks of IV Ancef which he will have finished 10/3 presented to the ID clinic for follow-up and found to be more confused than normal.  He is was diaphoretic and heart rate was in the 130s, temperature was 92.  They were not able to check his blood pressure.  He was transferred to the ED for further evaluation.  Patient was at a skilled nursing facility finishing up his IV antibiotic therapy.  Patient readmitted with delirium, hypothermia of unclear etiology, acute metabolic encephalopathy.  He was found to have hypoxemia, after he was placed on oxygen his mental status improved.  He became more alert.  He also developed SVT.  Cardiology was consulted.  He is currently on amiodarone drip.  Cardiology is recommending TEE to rule out endocarditis. Patient has had poor oral intake, palliative care consulted for goals of care and artificial nutrition.   Assessment & Plan:   Principal Problem:   Sepsis with acute organ dysfunction (HCC) Active Problems:   Diabetes mellitus without complication ([place])   S/P AVR   Diabetic polyneuropathy associated with type 2 diabetes mellitus (HCC)   Chronic bilateral low back pain without sciatica   Hypotension   Hypothermia   AKI (acute kidney injury) ([place])   Sepsis with acute organ dysfunction without septic shock ([place])  SIRS;  Patient presents with confusion, hypothermia, hypotension. Not Clear source of infection. Sepsis was ruled out.  Blood cultures no growth .  Chest x-ray no evidence for infection. Urine culture no growth to date.  ID was consulted due to recent MSSA bacteremia.  Patient was a started on cefepime and vancomycin.  Subsequently  vancomycin was discontinued.  Cefepime was discontinued 10/5.  -Cardiology recommend TEE to rule out endocarditis.  Repeated blood cultures 10/10 no growth to date.   Acute metabolic encephalopathy: Delirium, in the setting of recent infection, dehydration. Question Cefepime and hypoxemia.  B12 984, thiamine level pending, started IV thiamine. Ammonia level 21 normal, B12 984 normal.  TSH 1.9 normal EEG; Moderate to diffuse encephalopathy.  MRI repeated old stroke, artifact.  ABG hypoxemia; Placed on oxygen.  Patient has become more alert after he was started on oxygen, but continue to be confuse and poor oral intake.  Plan for TEE to ruled  out endocarditis.   Hypoxemia; suspect Sleep apnea, hypoventilation.  Hypoxemia by blood gas. CT angio negative for PE>  Continue  on 3 L oxygen.  Needs continuous oxygen supplementation.   Hyponatremia , hypokalemia, Hypomagnesemia;  Replaced.   SVT:  Patient had HR up 190-200.  Appreciate cardiology eval.  On  IV metoprolol, patient not taking oral meds.  ECHO : normal EF Develops episode of recurrent SVT 10/10, didn't resolved with IV metoprolol. Patient was off oxygen prior to episode. Became hypotensive, cardiology came at bedside. Started on IV amiodarone. HR improved.  Patient is thought not to be a candidate for EP study or ablation.  Nutrition;  He has not been eating, refuse to eat.  Family will discussed about tub feeding with palliative care  Oral thrush:  Started Nystatin.   Type 2 diabetes: Continue with a sliding scale insulin.   Parkinsonism:  Vascular parkinsonism; I discussed with Dr Martie Round, patient's primary neurologist about holding Carbidopa-levodopa.  He said we can hold for now, and he can reassess out patient needs for this medications.   Radiculopathy: Plan to hold gabapentin due to AMS.    Elevated D-dimer: Doppler lower extremity negative for DVT.    Estimated body mass index is 29.45 kg/m as  calculated from the following:   Height as of this encounter: 5\' 7"  (1.702 m).   Weight as of this encounter: 85.3 kg.   DVT prophylaxis: Lovenox Code Status: Full code Family Communication: Wife 10/11 Disposition Plan:  Status is: Inpatient  Remains inpatient appropriate because:IV treatments appropriate due to intensity of illness or inability to take PO  Dispo: The patient is from: SNF              Anticipated d/c is to: SNF              Patient currently is not medically stable to d/c.   Difficult to place patient No        Consultants:  ID  Procedures:    Antimicrobials:    Subjective: He seems a little bit more alert today, opens eyes to voice.  He still confused.  He refused to eat.  He denies pain.  Remove the oxygen, and reapply the oxygen   Objective: Vitals:   05/21/21 0530 05/21/21 0738 05/21/21 0825 05/21/21 1251  BP:  (!) 148/84  (!) 146/92  Pulse: 85 68  76  Resp: (!) 21 (!) 23  (!) 21  Temp:  (!) 97.5 F (36.4 C)  98.4 F (36.9 C)  TempSrc:  Oral  Oral  SpO2: 95% 94% 98% 96%  Weight:      Height:        Intake/Output Summary (Last 24 hours) at 05/21/2021 1440 Last data filed at 05/21/2021 0345 Gross per 24 hour  Intake 703.03 ml  Output 1950 ml  Net -1246.97 ml    Filed Weights   05/19/21 0556 05/20/21 0415 05/21/21 0500  Weight: 85 kg 85.1 kg 85.3 kg    Examination:  General exam: Chronic ill-appearing Respiratory system: Clear to  auscultation Cardiovascular system: S 1, S 2 RRR Gastrointestinal system: BS present, soft, nt Central nervous system: alert, confuse,  Extremities: no edema   Data Reviewed: I have personally reviewed following labs and imaging studies  CBC: Recent Labs  Lab 05/17/21 0726 05/18/21 1008 05/19/21 0131 05/20/21 0949 05/21/21 0621  WBC 10.7* 9.0 7.6 8.6 10.2  HGB 15.5 14.4 15.1 14.6 15.6  HCT 45.8 43.5 46.5 44.2 47.2  MCV 90.7 91.4 92.6 93.2 92.0  PLT 235 217 242 238 251    Basic  Metabolic Panel: Recent Labs  Lab 05/16/21 1639 05/17/21 0726 05/18/21 1008 05/19/21 0131 05/20/21 0949 05/21/21 0621  NA 132* 130* 131* 132* 135 136  K 3.6 4.0 3.3* 3.5 3.6 3.5  CL 95* 97* 97* 97* 99 99  CO2 26 23 25 27 28 28   GLUCOSE 199* 108* 112* 103* 109* 120*  BUN <5* <5* <5* <5* <5* <5*  CREATININE 0.91 0.88 0.83 0.87 0.92 0.95  CALCIUM 8.4* 8.4* 8.4* 8.7* 8.6* 8.6*  MG 1.9 1.8 1.5* 2.0  --  1.7    GFR: Estimated Creatinine Clearance: 72.3 mL/min (by C-G formula based on SCr of 0.95 mg/dL). Liver Function Tests: No results for input(s): AST, ALT, ALKPHOS, BILITOT, PROT, ALBUMIN in the last 168 hours.  No results for input(s): LIPASE, AMYLASE in the last 168 hours. Recent Labs  Lab 05/15/21 1315  AMMONIA 21  Coagulation Profile: No results for input(s): INR, PROTIME in the last 168 hours. Cardiac Enzymes: No results for input(s): CKTOTAL, CKMB, CKMBINDEX, TROPONINI in the last 168 hours. BNP (last 3 results) No results for input(s): PROBNP in the last 8760 hours. HbA1C: No results for input(s): HGBA1C in the last 72 hours. CBG: Recent Labs  Lab 05/20/21 1215 05/20/21 1608 05/20/21 2114 05/21/21 0742 05/21/21 1253  GLUCAP 114* 139* 120* 128* 116*    Lipid Profile: Recent Labs    05/18/21 1903  CHOL 201*  HDL 32*  LDLCALC 143*  TRIG 131  CHOLHDL 6.3    Thyroid Function Tests: No results for input(s): TSH, T4TOTAL, FREET4, T3FREE, THYROIDAB in the last 72 hours.  Anemia Panel: No results for input(s): VITAMINB12, FOLATE, FERRITIN, TIBC, IRON, RETICCTPCT in the last 72 hours.  Sepsis Labs: Recent Labs  Lab 05/15/21 1228  PROCALCITON <0.10     Recent Results (from the past 240 hour(s))  Blood Culture (routine x 2)     Status: None   Collection Time: 05/13/21  5:13 PM   Specimen: BLOOD  Result Value Ref Range Status   Specimen Description BLOOD LEFT ANTECUBITAL  Final   Special Requests   Final    BOTTLES DRAWN AEROBIC AND ANAEROBIC  Blood Culture results may not be optimal due to an inadequate volume of blood received in culture bottles   Culture   Final    NO GROWTH 5 DAYS Performed at [facility], [place] [address] [place], [place] [address]    Report Status 05/18/2021 FINAL  Final  Resp Panel by RT-PCR (Flu A&B, Covid) Nasopharyngeal Swab     Status: None   Collection Time: 05/13/21  5:22 PM   Specimen: Nasopharyngeal Swab; Nasopharyngeal(NP) swabs in vial transport medium  Result Value Ref Range Status   SARS Coronavirus 2 by RT PCR NEGATIVE NEGATIVE Final    Comment: (NOTE) SARS-CoV-2 target nucleic acids are NOT DETECTED.  The SARS-CoV-2 RNA is generally detectable in upper respiratory specimens during the acute phase of infection. The lowest concentration of SARS-CoV-2 viral copies this assay can detect is 138 copies/mL. A negative result does not preclude SARS-Cov-2 infection and should not be used as the sole basis for treatment or other patient management decisions. A negative result may occur with  improper specimen collection/handling, submission of specimen other than nasopharyngeal swab, presence of viral mutation(s) within the areas targeted by this assay, and inadequate number of viral copies(<138 copies/mL). A negative result must be combined with clinical observations, patient history, and epidemiological information. The expected result is Negative.  Fact Sheet for Patients:  EntrepreneurPulse.com.au  Fact Sheet for Healthcare Providers:  IncredibleEmployment.be  This test is no t yet approved or cleared by the [place] FDA and  has been authorized for detection and/or diagnosis of SARS-CoV-2 by FDA under an Emergency Use Authorization (EUA). This EUA will remain  in effect (meaning this test can be used) for the duration of the COVID-19 declaration under Section 564(b)(1) of the Act, 21 U.S.C.section 360bbb-3(b)(1), unless the authorization is  terminated  or revoked sooner.       Influenza A by PCR NEGATIVE NEGATIVE Final   Influenza B by PCR NEGATIVE NEGATIVE Final    Comment: (NOTE) The Xpert Xpress SARS-CoV-2/FLU/RSV plus assay is intended as an aid in the diagnosis of influenza from Nasopharyngeal swab specimens and should not be used as a sole basis for treatment. Nasal washings and aspirates are unacceptable for Xpert Xpress SARS-CoV-2/FLU/RSV  testing.  Fact Sheet for Patients: EntrepreneurPulse.com.au  Fact Sheet for Healthcare Providers: IncredibleEmployment.be  This test is not yet approved or cleared by the [place] FDA and has been authorized for detection and/or diagnosis of SARS-CoV-2 by FDA under an Emergency Use Authorization (EUA). This EUA will remain in effect (meaning this test can be used) for the duration of the COVID-19 declaration under Section 564(b)(1) of the Act, 21 U.S.C. section 360bbb-3(b)(1), unless the authorization is terminated or revoked.  Performed at [facility], [place] [address], [place], [place] [address]   Blood Culture (routine x 2)     Status: None   Collection Time: 05/13/21  5:39 PM   Specimen: BLOOD  Result Value Ref Range Status   Specimen Description BLOOD RIGHT ANTECUBITAL  Final   Special Requests   Final    BOTTLES DRAWN AEROBIC AND ANAEROBIC Blood Culture results may not be optimal due to an inadequate volume of blood received in culture bottles   Culture   Final    NO GROWTH 5 DAYS Performed at [facility], [place] [address] [place], [place] [address]    Report Status 05/18/2021 FINAL  Final  Urine Culture     Status: None   Collection Time: 05/15/21  1:16 PM   Specimen: Urine, Clean Catch  Result Value Ref Range Status   Specimen Description URINE, CLEAN CATCH  Final   Special Requests NONE  Final   Culture   Final    NO GROWTH Performed at [facility], [place] [address], [place], [place] [address]     Report Status 05/16/2021 FINAL  Final  MRSA Next Gen by PCR, Nasal     Status: None   Collection Time: 05/15/21  3:54 PM   Specimen: Nasal Mucosa; Nasal Swab  Result Value Ref Range Status   MRSA by PCR Next Gen NOT DETECTED NOT DETECTED Final    Comment: (NOTE) The GeneXpert MRSA Assay (FDA approved for NASAL specimens only), is one component of a comprehensive MRSA colonization surveillance program. It is not intended to diagnose MRSA infection nor to guide or monitor treatment for MRSA infections. Test performance is not FDA approved in patients less than 80 years old. Performed at [facility], [place] [address] [place], [place] [address]   Culture, blood (routine x 2)     Status: None (Preliminary result)   Collection Time: 05/20/21 12:02 PM   Specimen: BLOOD  Result Value Ref Range Status   Specimen Description BLOOD RIGHT ANTECUBITAL  Final   Special Requests   Final    BOTTLES DRAWN AEROBIC AND ANAEROBIC Blood Culture adequate volume   Culture   Final    NO GROWTH < 24 HOURS Performed at [facility], [place] [address], [place], [place] [address]    Report Status PENDING  Incomplete  Culture, blood (routine x 2)     Status: None (Preliminary result)   Collection Time: 05/20/21 12:02 PM   Specimen: BLOOD  Result Value Ref Range Status   Specimen Description BLOOD RIGHT ANTECUBITAL  Final   Special Requests   Final    BOTTLES DRAWN AEROBIC AND ANAEROBIC Blood Culture adequate volume   Culture   Final    NO GROWTH < 24 HOURS Performed at [facility], [place] [address] [place],  [address]    Report Status PENDING  Incomplete          Radiology Studies: No results found.  Scheduled Meds:  amLODipine  5 mg Oral Daily   atorvastatin  40 mg Oral QHS   clotrimazole   Topical BID   enoxaparin (LOVENOX) injection  40 mg Subcutaneous Q24H   fluticasone furoate-vilanterol  1 puff Inhalation Daily   insulin aspart  0-15 Units Subcutaneous  TID WC   insulin aspart  0-5 Units Subcutaneous QHS   metoprolol tartrate  2.5 mg Intravenous Q8H   nystatin  5 mL Oral QID   sodium chloride flush  10-40 mL Intracatheter Q12H   Continuous Infusions:  amiodarone 30 mg/hr (05/21/21 0726)   dextrose 5 % and 0.9% NaCl 75 mL/hr at 05/21/21 1215     LOS: 8 days    Time spent: 35 minutes    Lavon Bothwell A Ceci Taliaferro, MD [facility]   If 7PM-7AM, please contact night-coverage [email or web address]  05/21/2021, 2:40 PM

## 2021-05-21 NOTE — Progress Notes (Signed)
Progress Note  Patient Name: Michael Hines Date of Encounter: 05/21/2021  [facility] Cardiologist: None   Subjective   Much more alert today, but obviously confused. Oxygenating well on RA (95%, cannula lying on pillow). No further tachyarrhythmia. Mild bradycardia (high 40s) overnight.  Inpatient Medications    Scheduled Meds:  amLODipine  5 mg Oral Daily   atorvastatin  40 mg Oral QHS   clotrimazole   Topical BID   enoxaparin (LOVENOX) injection  40 mg Subcutaneous Q24H   fluticasone furoate-vilanterol  1 puff Inhalation Daily   insulin aspart  0-15 Units Subcutaneous TID WC   insulin aspart  0-5 Units Subcutaneous QHS   metoprolol tartrate  2.5 mg Intravenous Q8H   nystatin  5 mL Oral QID   potassium chloride  40 mEq Oral BID   Continuous Infusions:  amiodarone 30 mg/hr (05/21/21 0726)   dextrose 5 % and 0.9% NaCl 75 mL/hr at 05/21/21 0001   magnesium sulfate bolus IVPB     PRN Meds: acetaminophen **OR** acetaminophen, ipratropium-albuterol, ondansetron **OR** ondansetron (ZOFRAN) IV   Vital Signs    Vitals:   05/21/21 0500 05/21/21 0530 05/21/21 0738 05/21/21 0825  BP:   (!) 148/84   Pulse: (!) 58 85 68   Resp: (!) 22 (!) 21 (!) 23   Temp:   (!) 97.5 F (36.4 C)   TempSrc:   Oral   SpO2: 94% 95% 94% 98%  Weight: 85.3 kg     Height:        Intake/Output Summary (Last 24 hours) at 05/21/2021 0948 Last data filed at 05/21/2021 0345 Gross per 24 hour  Intake 703.03 ml  Output 2500 ml  Net -1796.97 ml   Last 3 Weights 05/21/2021 05/20/2021 05/19/2021  Weight (lbs) 188 lb 0.8 oz 187 lb 9.8 oz 187 lb 6.3 oz  Weight (kg) 85.3 kg 85.1 kg 85 kg      Telemetry    NSR - Personally Reviewed  ECG    No new tracing - Personally Reviewed  Physical Exam  Asleep, but easily woken. Disoriented GEN: No acute distress.   Neck: No JVD Cardiac: RRR, no murmurs, rubs, or gallops.  Respiratory: Clear to auscultation bilaterally. GI: Soft, nontender,  non-distended  MS: No edema; No deformity. Neuro:  Nonfocal  Psych: Normal affect   Labs    High Sensitivity Troponin:   Recent Labs  Lab 05/16/21 1639 05/16/21 1830  TROPONINIHS 24* 21*     Chemistry Recent Labs  Lab 05/18/21 1008 05/19/21 0131 05/20/21 0949 05/21/21 0621  NA 131* 132* 135 136  K 3.3* 3.5 3.6 3.5  CL 97* 97* 99 99  CO2 25 27 28 28   GLUCOSE 112* 103* 109* 120*  BUN <5* <5* <5* <5*  CREATININE 0.83 0.87 0.92 0.95  CALCIUM 8.4* 8.7* 8.6* 8.6*  MG 1.5* 2.0  --  1.7  GFRNONAA >60 >60 >60 >60  ANIONGAP 9 8 8 9     Lipids  Recent Labs  Lab 05/18/21 1903  CHOL 201*  TRIG 131  HDL 32*  LDLCALC 143*  CHOLHDL 6.3    Hematology Recent Labs  Lab 05/19/21 0131 05/20/21 0949 05/21/21 0621  WBC 7.6 8.6 10.2  RBC 5.02 4.74 5.13  HGB 15.1 14.6 15.6  HCT 46.5 44.2 47.2  MCV 92.6 93.2 92.0  MCH 30.1 30.8 30.4  MCHC 32.5 33.0 33.1  RDW 14.6 14.4 14.6  PLT 242 238 251   Thyroid  Recent Labs  Lab  05/15/21 1227  TSH 1.957    BNPNo results for input(s): BNP, PROBNP in the last 168 hours.  DDimer No results for input(s): DDIMER in the last 168 hours.   Radiology    No results found.  Cardiac Studies   TEE 04/18/2021: Impressions:  1. 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 V max 2.6 m/s, MG 15 mmHG,  EOA 1.87 cm2, DI 0.54. No definite evidence of vegetation. The leaflets  appear thickened and there is mild central regurgitation. Suspect the  regurgitation is due to valve  deterioration given the age of the valve and thickened leaflet appearance.  No definitive evidence of aortic root abscess. Would recommend close echo  follow-up after completion of antibiotics but no definitive evidence of  endocarditis. The aortic valve  has been repaired/replaced. Aortic valve regurgitation is mild. There is a  21 mm bioprosthetic valve present in the aortic position. Procedure Date:  07/18/2010.   2. Left ventricular ejection fraction, by estimation, is  60 to 65%. The  left ventricle has normal function.   3. Right ventricular systolic function is normal. The right ventricular  size is normal.   4. No left atrial/left atrial appendage thrombus was detected.   5. The mitral valve is degenerative. Mild mitral valve regurgitation. No  evidence of mitral stenosis.   6. Aneurysm of the ascending aorta, measuring 45 mm. There is Moderate  (Grade III) layered plaque involving the descending aorta.   Conclusion(s)/Recommendation(s): No evidence of vegetation/infective  endocarditis on this transesophageal echocardiogram. _______________   Limited TTE 05/17/2021: Impressions: 1. Left ventricular ejection fraction, by estimation, is 60 to 65%. The  left ventricle has normal function. The left ventricle has no regional  wall motion abnormalities. Left ventricular diastolic parameters are  consistent with Grade I diastolic  dysfunction (impaired relaxation).   2. Right ventricular systolic function is normal. The right ventricular  size is normal.   3. The mitral valve is normal in structure. No evidence of mitral valve  regurgitation. No evidence of mitral stenosis.   4. The aortic valve has been repaired/replaced. Aortic valve  regurgitation is not visualized. No aortic stenosis is present. There is a  21 mm bioprosthetic valve present in the aortic position. Aortic valve  mean gradient measures 10.0 mmHg. Aortic valve   Vmax measures 2.20 m/s.   5. The inferior vena cava is normal in size with greater than 50%  respiratory variability, suggesting right atrial pressure of 3 mmHg.   Comparison(s): No significant change from prior study. Prior images  reviewed side by side.   Patient Profile     75 y.o. 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 Repeat TEE tomorrow 0900h w Dr. Debara Pickett for suspicion of prosthetic valve endocarditis. Consent obtained from his family.     For questions or updates, please contact [place] Please consult [email or web address] for contact info under        Signed, Sanda Klein, MD  05/21/2021, 9:48 AM

## 2021-05-21 NOTE — Progress Notes (Signed)
Arrived to discuss bedside PICC placement with wife and son. Patient very agitated and writhing in bed. Wife and son want to wait until after TEE tomorrow for discussion of placement as patient had recently had a PICC discontinued, and to be sure it would be needed for further therapy. New PIV started. Primary RN notified. VAST to follow up after TEE tomorrow.

## 2021-05-21 NOTE — Progress Notes (Signed)
Physical Therapy Treatment Patient Details Name: Michael Hines MRN: [id number] DOB: 1945/09/10 Today's Date: 05/21/2021   History of Present Illness 75 y.o. male presents to [facility] hospital on 05/13/2021 from ID clinic due to hypothermia and hypotension. Pt with recent hospitalization in September for MSSA septicemia, was discharged to SNF. Blood cultures negative this admission. PMH includes COPD, type 2 diabetes, hyperlipidemia, bioprosthetic aortic valve replacement.    PT Comments    Patient easily aroused and remained awake throughout session. Requires encouragement to participate and was able to come to sit at EOB with min assist. Sat with minguard x 3 minutes. Pt then abruptly returned to supine.     Recommendations for follow up therapy are one component of a multi-disciplinary discharge planning process, led by the attending physician.  Recommendations may be updated based on patient status, additional functional criteria and insurance authorization.  Follow Up Recommendations  SNF     Equipment Recommendations  Wheelchair (measurements PT);Wheelchair cushion (measurements PT);Hospital bed;3in1 (PT) (hoyer lift)    Recommendations for Other Services       Precautions / Restrictions Precautions Precautions: Fall     Mobility  Bed Mobility Overal bed mobility: Needs Assistance Bed Mobility: Rolling;Sidelying to Sit;Sit to Supine Rolling: Min guard (with rail) Sidelying to sit: Min assist;HOB elevated (with rail)   Sit to supine: Supervision   General bed mobility comments: assist raising torso to sit EOB; pt returned to supine on his own    Transfers                 General transfer comment: unwilling to attempt  Ambulation/Gait                 Stairs             Wheelchair Mobility    Modified Rankin (Stroke Patients Only)       Balance Overall balance assessment: Needs assistance Sitting-balance support: Bilateral upper extremity  supported;Feet unsupported Sitting balance-Leahy Scale: Poor Sitting balance - Comments: mild posterior lean Postural control: Posterior lean                                  Cognition Arousal/Alertness: Awake/alert Behavior During Therapy: Flat affect Overall Cognitive Status: Impaired/Different from baseline Area of Impairment: Orientation;Attention;Following commands;Memory;Safety/judgement;Awareness;Problem solving                 Orientation Level: Disoriented to;Time;Situation Current Attention Level: Sustained Memory: Decreased short-term memory Following Commands: Follows one step commands inconsistently;Follows one step commands with increased time Safety/Judgement: Decreased awareness of safety;Decreased awareness of deficits   Problem Solving: Slow processing;Requires verbal cues;Requires tactile cues General Comments: pt aware he is sick and in hospital; following commands with multi-modal cues and incr time; easily frustrated and cut session short      Exercises General Exercises - Lower Extremity Ankle Circles/Pumps: AROM;Both;10 reps Heel Slides: AAROM;Strengthening;Both;5 reps (resisted extension)    General Comments General comments (skin integrity, edema, etc.): Wife initially present and pt more cooperative. She stepped out of room and pt insisted on returning to supine      Pertinent Vitals/Pain Pain Assessment: Faces Faces Pain Scale: Hurts even more Pain Location: left UE, red at BP cuff sit Pain Descriptors / Indicators: Grimacing Pain Intervention(s): Limited activity within patient's tolerance    Home Living  Prior Function            PT Goals (current goals can now be found in the care plan section) Acute Rehab PT Goals Patient Stated Goal: wife wants pt to get well and be able to go home after rehab Time For Goal Achievement: 05/30/21 Potential to Achieve Goals: Fair Progress towards PT  goals: Progressing toward goals    Frequency    Min 2X/week      PT Plan Current plan remains appropriate    Co-evaluation              AM-PAC PT "6 Clicks" Mobility   Outcome Measure  Help needed turning from your back to your side while in a flat bed without using bedrails?: A Little Help needed moving from lying on your back to sitting on the side of a flat bed without using bedrails?: A Little Help needed moving to and from a bed to a chair (including a wheelchair)?: Total Help needed standing up from a chair using your arms (e.g., wheelchair or bedside chair)?: Total Help needed to walk in hospital room?: Total Help needed climbing 3-5 steps with a railing? : Total 6 Click Score: 10    End of Session Equipment Utilized During Treatment: Oxygen Activity Tolerance: Treatment limited secondary to medical complications (Comment) (confusion;) Patient left: in bed;with call bell/phone within reach;with bed alarm set;with nursing/sitter in room (IV team) Nurse Communication: Other (comment) (sat EOB for several minutes) PT Visit Diagnosis: Muscle weakness (generalized) (M62.81);Adult, failure to thrive (R62.7)     Time: 1287-8676 PT Time Calculation (min) (ACUTE ONLY): 16 min  Charges:  $Therapeutic Activity: 8-22 mins                      Arby Barrette, PT Pager [phone]    Rexanne Mano 05/21/2021, 11:52 AM

## 2021-05-21 NOTE — Progress Notes (Addendum)
1745: Patient pulled out only PIV access for amiodarone drip.  IV Team notified as midline was placed recently. IV team let nursing know that they can NOT use midline for amiodarone infusion. MD notified. Options are PICC or central line placement.  Amiodarone drip on hold for now.   IV team paged to help place a PIV until a PICC can be placed.  1915: IV team called to discuss with nursing that PICC nurse to arrive shortly.

## 2021-05-21 NOTE — Progress Notes (Signed)
Dear Doctor: This patient has been identified as a candidate for PICC or CVC for the following reason (s): patient infusing Amiodarone which CANNOT be infused through midline. If you agree, please write an order for the indicated device.   Thank you for supporting the early vascular access assessment program.

## 2021-05-21 NOTE — Progress Notes (Signed)
Speech Language Pathology Treatment: Dysphagia  Patient Details Name: Michael Hines MRN: [id number] DOB: 1946/04/06 Today's Date: 05/21/2021 Time: 0950-1005 SLP Time Calculation (min) (ACUTE ONLY): 15 min  Assessment / Plan / Recommendation Clinical Impression  Pt seen for follow up after BSE completed 05/17/21. Wife present today, and reports poor intake since before admission. Pt accepted minimal trials of solid texture and thin liquid. No obvious oral issues or overt s/s aspiration observed. ST will sign off at this time. Please reconsult if needs arise.  Noted plan for Palliative Care meeting with family this afternoon. Recommend consideration of dietician consult for calorie counts, supplements, and to determine appropriateness of non-oral feeding method if necessary.   HPI HPI: Pt is a 75 y.o. male presents the ED on 05/13/2021 from ID clinic due to hypothermia and hypotension. CXR on admission negative for focal airspace disease. MRI brain 10/6 negative for acute changes. EEG 10/7: Pt with recent hospitalization in September for MSSA septicemia, was discharged to SNF. PMH:  COPD, type 2 diabetes, hyperlipidemia, bioprosthetic aortic valve replacement. Pt with recent hospitalization in September for MSSA septicemia, was discharged to SNF.      SLP Plan  Discharge SLP treatment due to goals met      Recommendations for follow up therapy are one component of a multi-disciplinary discharge planning process, led by the attending physician.  Recommendations may be updated based on patient status, additional functional criteria and insurance authorization.    Recommendations  Diet recommendations: Regular;Thin liquid Liquids provided via: Cup;Straw Medication Administration:  (as tolerated) Supervision: Patient able to self feed;Staff to assist with self feeding Compensations: Slow rate;Small sips/bites Postural Changes and/or Swallow Maneuvers: Seated upright 90 degrees;Upright 30-60  min after meal                Oral Care Recommendations: Oral care BID Follow up Recommendations: None SLP Visit Diagnosis: Dysphagia, unspecified (R13.10) Plan: Discharge SLP treatment due to (comment)       GO              Rowland Ericsson B. Quentin Ore, [facility], [place] Speech Language Pathologist Office: [phone]  Shonna Chock  05/21/2021, 10:08 AM

## 2021-05-21 NOTE — Consult Note (Signed)
Palliative Care Consult Note                                  Date: 05/21/2021   Patient Name: Michael Hines  DOB: 1946/06/17  MRN: [id number]  Age / Sex: 75 y.o., male  PCP: Finis Bud, MD Referring Physician: Elmarie Shiley, MD  Reason for Consultation: Establishing goals of care  HPI/Patient Profile: Palliative Care consult requested for goals of care discussion in this 75 y.o. male  with past medical history of diabetes, hyperlipidemia, hypotension, bioprosthetic aortic valve replacement, COPD, and anxiety. He was admitted on 05/13/2021 after being seen by Infection Disease and requesting further work-up due to hypotension and hypothermia.    Past Medical History:  Diagnosis Date  . Anxiety   . B12 deficiency   . Back pain   . Diabetes mellitus without complication ([place])   . Hyperlipidemia   . Hypotension 05/13/2021  . Hypothermia 05/13/2021  . Severe sepsis with septic shock ([place]) 05/13/2021  . Tachycardia 05/13/2021  . Tachypnea 05/13/2021  . Testosterone insufficiency      Subjective:   This NP Osborne Oman reviewed medical records, received report from team, assessed the patient and then met at the patient's bedside with patient's wife, 2 sons, sister, and brother to discuss diagnosis, prognosis, GOC, EOL wishes disposition and options.  [name] is awake and alert.  Able to answer some questions appropriately however obvious confusion noted.  Denies pain or shortness of breath.  Will follow simple commands. Will not keep oxygen on.    Concept of Palliative Care was introduced as specialized medical care for people and their families living with serious illness.  It focuses on providing relief from the symptoms and stress of a serious illness.  The goal is to improve quality of life for both the patient and the family. Values and goals of care important to patient and family were attempted to be elicited.  I  created space and opportunity for patient and family to explore state of health prior to admission, thoughts, and feelings.  Prior to patient's recent illness he lived in the home with his wife and was independent of all ADLs.  Family shares patient's health has declined over the past year.  Patient's appetite has significantly decreased.  Wife endorses patient has lost approximately 25 pounds.  He has no desire to eat and no appetite.  We discussed His current illness and what it means in the larger context of His on-going co-morbidities. Natural disease trajectory and expectations were discussed.  Family verbalized understanding of current illness and co-morbidities.  We discussed at length patient's poor nutrition and no interest in eating.  We discussed artificial feedings in the setting of malnutrition.  Family understands would not sustain life without appropriate nutrition.  Education provided on artificial feedings/PEG, including pros and cons.  Family is clear and expressed wishes to continue to encourage patient to eat.  They would be open to temporary feeding tube with a limitation of a week.  If patient showed no improvement or increase in oral intake they are all in mutual agreement for no long-term feeding tube/PEG.  We discussed worst-case scenario including consideration of end-of-life/hospice.  Family tearful verbalizing understanding.  They wish to focus on patient's quality of life emphasizing this is more important than quantity.  I discussed the importance of continued conversation with family and their medical providers regarding overall  plan of care and treatment options, ensuring decisions are within the context of the patients values and GOCs.  Questions and concerns were addressed. The family was encouraged to call with questions or concerns.  PMT will continue to support holistically as needed.  Life Review: Patient resided in the home with his wife of 4 years.  He has 2 sons  and a sister/brother who are closely involved in his care.  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  He enjoys watching basketball, NASCAR, and golfing.   Objective:   Primary Diagnoses: Present on Admission: . Hypotension . Hypothermia . Diabetic polyneuropathy associated with type 2 diabetes mellitus ([place]) . Chronic bilateral low back pain without sciatica . Sepsis with acute organ dysfunction without septic shock ([facility])   Scheduled Meds: . amLODipine  5 mg Oral Daily  . atorvastatin  40 mg Oral QHS  . clotrimazole   Topical BID  . enoxaparin (LOVENOX) injection  40 mg Subcutaneous Q24H  . fluticasone furoate-vilanterol  1 puff Inhalation Daily  . insulin aspart  0-15 Units Subcutaneous TID WC  . insulin aspart  0-5 Units Subcutaneous QHS  . metoprolol tartrate  2.5 mg Intravenous Q8H  . nystatin  5 mL Oral QID  . sodium chloride flush  10-40 mL Intracatheter Q12H    Continuous Infusions: . amiodarone 30 mg/hr (05/21/21 0726)  . dextrose 5 % and 0.9% NaCl 75 mL/hr at 05/21/21 1215    PRN Meds: acetaminophen **OR** acetaminophen, ipratropium-albuterol, ondansetron **OR** ondansetron (ZOFRAN) IV, sodium chloride flush  No Known Allergies  Review of Systems  Unable to perform ROS: Acuity of condition   Physical Exam General: NAD,  chronically-ill appearing Cardiovascular: regular rate and rhythm Pulmonary:diminished bilaterally  Abdomen: soft, nontender, + bowel sounds Extremities: no edema, no joint deformities Skin: no rashes, warm and dry Neurological: Alert to self and family.  Confusion noted.  Will follow simple commands.  Continues to take oxygen off and becomes agitated when trying to reapply.  Vital Signs:  BP (!) 146/92 (BP Location: Left Arm)   Pulse 76   Temp 98.4 F (36.9 C) (Oral)   Resp (!) 21   Ht '5\' 7"'  (1.702 m)   Wt 85.3 kg   SpO2 96%   BMI 29.45 kg/m  Pain Scale: 0-10 POSS *See Group Information*: 1-Acceptable,Awake and alert Pain Score: 0-No  pain  SpO2: SpO2: 96 % O2 Device:SpO2: 96 % O2 Flow Rate: .O2 Flow Rate (L/min): 3 L/min  IO: Intake/output summary:  Intake/Output Summary (Last 24 hours) at 05/21/2021 1427 Last data filed at 05/21/2021 0345 Gross per 24 hour  Intake 703.03 ml  Output 2500 ml  Net -1796.97 ml    LBM: Last BM Date: 05/19/21 Baseline Weight: Weight: 90.7 kg Most recent weight: Weight: 85.3 kg      Palliative Assessment/Data: PPS 30%   Advanced Care Planning:   Primary Decision Maker: HCPOA  Code Status/Advance Care Planning: Full code  A discussion was had today regarding advanced directives. Concepts specific to code status, artifical feeding and hydration, continued IV antibiotics and rehospitalization was had.   I discussed at length patient's full CODE STATUS with consideration of his current illness and comorbidities.  Family verbalized understanding and share they are leaning towards DNR however would like additional time to discuss amongst each other.  Son also shares patient has an advanced directive and he is obtaining documents from his attorney.  Family would like to review documents and understand patient's wishes however with understanding  this document was completed more than 20 years ago.  Encourage family to review document and have ongoing family discussions however centering on decisions around patient's quality of life.  During discussion wife states on admission patient told providers he wanted to remain a full code.  Sons verbalized understanding also with hesitancy with awareness patient was confused on admission.  Mr. Apolinar interrupted the conversation stating "are you talking about what I want".  Wife responded acknowledging patient's previous expressed wishes for full code.  Patient responded "you better not.  I did not say that".  Again encouraged family to continue ongoing discussions and review advanced directives.  They are aware I am available for further conversations  and assistance with dissecting documents if needed.  Also encouraged son to bring in a copy to be filed.  Family does acknowledge patient's son Rowland Ericsson Junior is patient's documented HCPOA.  We discussed at length best case and worst case scenarios.  Family is clear and expressed goals to continue to treat the treatable.  They are remaining hopeful for some improvement however are also preparing for no improvement/further decline.  They do not wish to see patient suffer.  They are open to temporary artificial feeding if required for time trial of a week however would not be interested in long-term/artificial feedings/PEG.  Family plans to review advanced directives.  If patient was to further decline or show no meaningful recovery family would then be open to further discussions regarding focusing care on comfort.   Assessment & Plan:   SUMMARY OF RECOMMENDATIONS   Full Code-as confirmed by family.  They are leaning towards DNR however would like to review patient's advanced directive after obtaining from his attorney. No PEG, family would be amendable to temporary feeding time trial x1 week to allow patient an opportunity to show improvement. Patient's son Seiya Silsby Junior is his documented POA. Continue with current plan of care  Family clear and expressed goals to continue to treat the treatable, watchful waiting. PMT will continue to support and follow. Please call team line with urgent needs.  Symptom Management:  Per Attending  Palliative Prophylaxis:  Aspiration, Bowel Regimen, Delirium Protocol, and Frequent Pain Assessment  Additional Recommendations (Limitations, Scope, Preferences): No Artificial Feeding and treat the treatable  Psycho-social/Spiritual:  Desire for further Chaplaincy support: no Additional Recommendations:  Ongoing goals of care  Prognosis:  Guarded  Discharge Planning:  To Be Determined   Family  expressed understanding and was in agreement  with this plan.   Time Total: 65 min  Visit consisted of counseling and education dealing with the complex and emotionally intense issues of symptom management and palliative care in the setting of serious and potentially life-threatening illness.Greater than 50%  of this time was spent counseling and coordinating care related to the above assessment and plan.  Signed by:  Alda Lea, AGPCNP-BC Palliative Medicine Team  Phone: [phone] Pager: [phone] Amion: Bjorn Pippin   Thank you for allowing the Palliative Medicine Team to assist in the care of this patient. Please utilize secure chat with additional questions, if there is no response within 30 minutes please call the above phone number. Palliative Medicine Team providers are available by phone from 7am to 5pm daily and can be reached through the team cell phone.  Should this patient require assistance outside of these hours, please call the patient's attending physician.

## 2021-05-22 ENCOUNTER — Inpatient Hospital Stay (HOSPITAL_COMMUNITY): Payer: Medicare Other | Admitting: Anesthesiology

## 2021-05-22 ENCOUNTER — Encounter (HOSPITAL_COMMUNITY)
Admission: EM | Disposition: A | Discharge status: Hospice | Payer: Self-pay | Source: Home / Self Care | Attending: Internal Medicine

## 2021-05-22 ENCOUNTER — Encounter (HOSPITAL_COMMUNITY): Payer: Self-pay | Admitting: Internal Medicine

## 2021-05-22 ENCOUNTER — Inpatient Hospital Stay (HOSPITAL_COMMUNITY): Payer: Medicare Other

## 2021-05-22 DIAGNOSIS — E1142 Type 2 diabetes mellitus with diabetic polyneuropathy: Secondary | ICD-10-CM | POA: Diagnosis not present

## 2021-05-22 DIAGNOSIS — T826XXA Infection and inflammatory reaction due to cardiac valve prosthesis, initial encounter: Secondary | ICD-10-CM | POA: Diagnosis not present

## 2021-05-22 DIAGNOSIS — I330 Acute and subacute infective endocarditis: Secondary | ICD-10-CM | POA: Diagnosis not present

## 2021-05-22 DIAGNOSIS — N179 Acute kidney failure, unspecified: Secondary | ICD-10-CM | POA: Diagnosis not present

## 2021-05-22 DIAGNOSIS — A419 Sepsis, unspecified organism: Secondary | ICD-10-CM | POA: Diagnosis not present

## 2021-05-22 DIAGNOSIS — I350 Nonrheumatic aortic (valve) stenosis: Secondary | ICD-10-CM | POA: Diagnosis not present

## 2021-05-22 DIAGNOSIS — I351 Nonrheumatic aortic (valve) insufficiency: Secondary | ICD-10-CM

## 2021-05-22 DIAGNOSIS — I669 Occlusion and stenosis of unspecified cerebral artery: Secondary | ICD-10-CM | POA: Diagnosis not present

## 2021-05-22 DIAGNOSIS — I38 Endocarditis, valve unspecified: Secondary | ICD-10-CM | POA: Diagnosis not present

## 2021-05-22 DIAGNOSIS — I76 Septic arterial embolism: Secondary | ICD-10-CM | POA: Diagnosis not present

## 2021-05-22 DIAGNOSIS — B9561 Methicillin susceptible Staphylococcus aureus infection as the cause of diseases classified elsewhere: Secondary | ICD-10-CM

## 2021-05-22 HISTORY — PX: TEE WITHOUT CARDIOVERSION: SHX5443

## 2021-05-22 LAB — GLUCOSE, CAPILLARY
Glucose-Capillary: 125 mg/dL — ABNORMAL HIGH (ref 70–99)
Glucose-Capillary: 130 mg/dL — ABNORMAL HIGH (ref 70–99)
Glucose-Capillary: 128 mg/dL — ABNORMAL HIGH (ref 70–99)
Glucose-Capillary: 86 mg/dL (ref 70–99)

## 2021-05-22 LAB — BASIC METABOLIC PANEL
Anion gap: 9 (ref 5–15)
BUN: <5 mg/dL — ABNORMAL LOW (ref 8–23)
CO2: 25 mmol/L (ref 22–32)
Calcium: 8.4 mg/dL — ABNORMAL LOW (ref 8.9–10.3)
Chloride: 98 mmol/L (ref 98–111)
Creatinine, Ser: 0.79 mg/dL (ref 0.61–1.24)
GFR, Estimated: >60 mL/min (ref >60)
Glucose, Bld: 110 mg/dL — ABNORMAL HIGH (ref 70–99)
Potassium: 3.3 mmol/L — ABNORMAL LOW (ref 3.5–5.1)
Sodium: 132 mmol/L — ABNORMAL LOW (ref 135–145)

## 2021-05-22 LAB — CBC
HCT: 45.7 % (ref 39.0–52.0)
Hemoglobin: 15.2 g/dL (ref 13.0–17.0)
MCH: 30.7 pg (ref 26.0–34.0)
MCHC: 33.3 g/dL (ref 30.0–36.0)
MCV: 92.3 fL (ref 80.0–100.0)
Platelets: 246 10*3/uL (ref 150–400)
RBC: 4.95 MIL/uL (ref 4.22–5.81)
RDW: 14.4 % (ref 11.5–15.5)
WBC: 9 10*3/uL (ref 4.0–10.5)
nRBC: 0 % (ref 0.0–0.2)

## 2021-05-22 LAB — ECHO TEE
AV Mean grad: 3 mmHg
AV Peak grad: 5.3 mmHg
Ao pk vel: 1.15 m/s
P 1/2 time: 646 msec

## 2021-05-22 SURGERY — ECHOCARDIOGRAM, TRANSESOPHAGEAL
Anesthesia: Monitor Anesthesia Care

## 2021-05-22 MED ORDER — BUTAMBEN-TETRACAINE-BENZOCAINE 2-2-14 % EX AERO
INHALATION_SPRAY | CUTANEOUS | Status: DC | PRN
  Administered 2021-05-22: 1 via TOPICAL

## 2021-05-22 MED ORDER — GENTAMICIN IN SALINE 1.6-0.9 MG/ML-% IV SOLN
80.0000 mg | Freq: Three times a day (TID) | INTRAVENOUS | Status: DC
  Administered 2021-05-22 – 2021-05-24 (×5): 80 mg via INTRAVENOUS
  Filled 2021-05-22 (×6): qty 50

## 2021-05-22 MED ORDER — PROPOFOL 10 MG/ML IV BOLUS
INTRAVENOUS | Status: DC | PRN
  Administered 2021-05-22: 20 mg via INTRAVENOUS

## 2021-05-22 MED ORDER — SODIUM CHLORIDE 0.9 % IV SOLN
300.0000 mg | Freq: Three times a day (TID) | INTRAVENOUS | Status: DC
  Administered 2021-05-22 – 2021-05-23 (×3): 300 mg via INTRAVENOUS
  Filled 2021-05-22 (×5): qty 300

## 2021-05-22 MED ORDER — CEFAZOLIN SODIUM-DEXTROSE 2-4 GM/100ML-% IV SOLN: 2.0000 g | Freq: Three times a day (TID) | INTRAVENOUS | Status: DC

## 2021-05-22 MED ORDER — POTASSIUM CHLORIDE 20 MEQ PO PACK
40.0000 meq | PACK | Freq: Once | ORAL | Status: AC
  Administered 2021-05-22: 40 meq via ORAL
  Filled 2021-05-22: qty 2

## 2021-05-22 MED ORDER — PROPOFOL 500 MG/50ML IV EMUL
INTRAVENOUS | Status: DC | PRN
  Administered 2021-05-22: 100 ug/kg/min via INTRAVENOUS

## 2021-05-22 MED ORDER — SODIUM CHLORIDE 0.9 % IV SOLN
2.0000 g | INTRAVENOUS | Status: DC
  Administered 2021-05-22 – 2021-05-23 (×5): 2 g via INTRAVENOUS
  Filled 2021-05-22 (×8): qty 2000

## 2021-05-22 MED ORDER — LIDOCAINE 2% (20 MG/ML) 5 ML SYRINGE
INTRAMUSCULAR | Status: DC | PRN
Start: 2021-05-22 — End: 2021-05-22
  Administered 2021-05-22: 100 mg via INTRAVENOUS

## 2021-05-22 NOTE — Progress Notes (Signed)
1500: Hospitalist would like to hold off on PICC line placement as he is hoping patient can switch to PO antibiotics tomorrow. PICC team notified.

## 2021-05-22 NOTE — Progress Notes (Signed)
PROGRESS NOTE        PATIENT DETAILS Name: Michael Hines Age: 75 y.o. Sex: male Date of Birth: Aug 04, 1946 Admit Date: 05/13/2021 Admitting Physician Kristopher Oppenheim, DO HWE:[id number], [name], MD  Brief Narrative: Patient is a 75 y.o. male with history of aortic valve replacement (bioprosthetic valve)-recent hospitalization (9/2-9/9) for MSSA bacteremia (TEE negative for endocarditis on 9/8)-completed IV Ancef x4 weeks.  Patient was referred to the ED from ID clinic on 10/3 for evaluation of encephalopathy/hypothermia.  Patient was thought to have SIRS-however blood cultures was negative-further hospital course was complicated by development of SVT requiring IV amiodarone and cardiology consultation--eventually patient underwent TEE on 10/12-which showed mitral valve vegetation.  See below for further details.  Subjective: Pleasantly confused.  Spouse at bedside.  Objective: Vitals: Blood pressure (!) 153/90, pulse 68, temperature 98 F (36.7 C), temperature source Oral, resp. rate 18, height 5\' 7"  (1.702 m), weight 83 kg, SpO2 95 %.   Exam: Gen Exam: Pleasantly confused.  Chronically ill-appearing. HEENT:atraumatic, normocephalic Chest: B/L clear to auscultation anteriorly CVS:S1S2 regular Abdomen:soft non tender, non distended Extremities:no edema Neurology: Difficult exam but seems to be moving all 4 extremities. Skin: no rash  Pertinent Labs/Radiology: WBC: 9.0 Hb: 15.2 Na: 132 K: 3.3 Creatinine: 0.79  10/03: Blood culture: No growth 10/10>>Blood culture: No growth 10/12>> blood culture: Ordered  10/07: EEG: No seizures. 10/07: CTA chest: No PE 10/08: MRI brain>> acute cortical infarct versus artifact. 10/08:>> CT angio head and neck: No high-grade stenosis. 10/12>> TEE: Endocarditis involving mitral valve, diffuse thickening of bioprosthetic aortic valve leaflets.   Assessment/Plan: Acute metabolic encephalopathy: Probably due to  SIR's/endocarditis-slowly improving-continue supportive care.  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  Restart IV Ancef-repeat blood cultures today.  Have reconsulted infectious disease.  SVT: Maintaining sinus rhythm-on IV amiodarone/IV metoprolol with plans to transition to oral medication when he is a bit more awake and alert.  History of aortic valve replacement with bioprosthetic valve  ?  Acute CVA: Reviewed neurology note-thought to be a artifact rather than a acute CVA-however study was limited due to motion artifact.  History of vascular parkinsonism: Prior MD-Dr. Joseph Pierini of discussed with patient's primary neurologist-Sinemet on hold.  Oral thrush: On nystatin  Hypokalemia: Replete and recheck  Hyponatremia: Mild-stable to be followed closely  HTN: BP relatively stable-continue amlodipine.  HLD: Continue statin  DM-2 (A1c 7.9 on 9/2): CBG stable-continue SSI  Recent Labs    05/21/21 2032 05/22/21 1014 05/22/21 1153  GLUCAP 96 125* 130*     COPD: Not in exacerbation-continue bronchodilators  Ascending thoracic aneurysm 4.8 cm: Radiology recommending semiannual imaging by CTA or MRA.  Failure to thrive syndrome/frailty: Poor overall prognosis-palliative care following-we will await further recommendations-family discussion in progress.  Procedures: None Consults: None DVT Prophylaxis: Lovenox Code Status:Full code Family Communication: Spouse at bedside  Time spent: 71 minutes-Greater than 50% of this time was spent in counseling, explanation of diagnosis, planning of further management, and coordination of care.  Diet: Diet Order     None          Disposition Plan: Status is: Inpatient  Remains  inpatient appropriate because:Inpatient level of care appropriate  due to severity of illness  Dispo: The patient is from: SNF              Anticipated d/c is to: SNF              Patient currently is not medically stable to d/c.   Difficult to place patient No   Barriers to Discharge: Ongoing/lingering encephalopathy-mitral valve endocarditis-restarting IV antibiotics.  Antimicrobial agents: Anti-infectives (From admission, onward)    Start     Dose/Rate Route Frequency Ordered Stop   05/14/21 2000  vancomycin (VANCOREADY) IVPB 750 mg/150 mL  Status:  Discontinued        750 mg 150 mL/hr over 60 Minutes Intravenous Every 24 hours 05/13/21 1902 05/14/21 1011   05/14/21 1900  vancomycin (VANCOREADY) IVPB 1500 mg/300 mL  Status:  Discontinued        1,500 mg 150 mL/hr over 120 Minutes Intravenous Every 24 hours 05/14/21 1011 05/14/21 1418   05/14/21 1400  ceFEPIme (MAXIPIME) 2 g in sodium chloride 0.9 % 100 mL IVPB  Status:  Discontinued        2 g 200 mL/hr over 30 Minutes Intravenous Every 8 hours 05/14/21 1000 05/15/21 0947   05/14/21 0600  ceFEPIme (MAXIPIME) 2 g in sodium chloride 0.9 % 100 mL IVPB  Status:  Discontinued        2 g 200 mL/hr over 30 Minutes Intravenous Every 12 hours 05/14/21 0201 05/14/21 1000   05/14/21 0100  ceFEPIme (MAXIPIME) 2 g in sodium chloride 0.9 % 100 mL IVPB  Status:  Discontinued        2 g 200 mL/hr over 30 Minutes Intravenous Every 8 hours 05/13/21 1710 05/14/21 0200   05/13/21 1700  ceFEPIme (MAXIPIME) 2 g in sodium chloride 0.9 % 100 mL IVPB        2 g 200 mL/hr over 30 Minutes Intravenous  Once 05/13/21 1654 05/13/21 1903   05/13/21 1700  vancomycin (VANCOREADY) IVPB 1750 mg/350 mL        1,750 mg 175 mL/hr over 120 Minutes Intravenous  Once 05/13/21 1654 05/13/21 2103        MEDICATIONS: Scheduled Meds:  amLODipine  5 mg Oral Daily   atorvastatin  40 mg Oral QHS   clotrimazole   Topical BID   enoxaparin (LOVENOX) injection  40 mg  Subcutaneous Q24H   fluticasone furoate-vilanterol  1 puff Inhalation Daily   insulin aspart  0-15 Units Subcutaneous TID WC   insulin aspart  0-5 Units Subcutaneous QHS   metoprolol tartrate  2.5 mg Intravenous Q8H   nystatin  5 mL Oral QID   sodium chloride flush  10-40 mL Intracatheter Q12H   Continuous Infusions:  amiodarone 30 mg/hr (05/22/21 0839)   dextrose 5 % and 0.9% NaCl 50 mL/hr at 05/22/21 0608   PRN Meds:.acetaminophen **OR** acetaminophen, ipratropium-albuterol, LORazepam, ondansetron **OR** ondansetron (ZOFRAN) IV, sodium chloride flush   I have personally reviewed following labs and imaging studies  LABORATORY DATA: CBC: Recent Labs  Lab 05/18/21 1008 05/19/21 0131 05/20/21 0949 05/21/21 0621 05/22/21 0346  WBC 9.0 7.6 8.6 10.2 9.0  HGB 14.4 15.1 14.6 15.6 15.2  HCT 43.5 46.5 44.2 47.2 45.7  MCV 91.4 92.6 93.2 92.0 92.3  PLT 217 242 238 251 937    Basic Metabolic Panel: Recent Labs  Lab 05/16/21 1639 05/17/21 0726 05/18/21 1008 05/19/21 0131 05/20/21 0949 05/21/21 0621 05/22/21 0346  NA 132* 130* 131* 132* 135 136 132*  K 3.6 4.0  3.3* 3.5 3.6 3.5 3.3*  CL 95* 97* 97* 97* 99 99 98  CO2 26 23 25 27 28 28 25   GLUCOSE 199* 108* 112* 103* 109* 120* 110*  BUN <5* <5* <5* <5* <5* <5* <5*  CREATININE 0.91 0.88 0.83 0.87 0.92 0.95 0.79  CALCIUM 8.4* 8.4* 8.4* 8.7* 8.6* 8.6* 8.4*  MG 1.9 1.8 1.5* 2.0  --  1.7  --     GFR: Estimated Creatinine Clearance: 84.8 mL/min (by C-G formula based on SCr of 0.79 mg/dL).  Liver Function Tests: No results for input(s): AST, ALT, ALKPHOS, BILITOT, PROT, ALBUMIN in the last 168 hours. No results for input(s): LIPASE, AMYLASE in the last 168 hours. No results for input(s): AMMONIA in the last 168 hours.  Coagulation Profile: No results for input(s): INR, PROTIME in the last 168 hours.  Cardiac Enzymes: No results for input(s): CKTOTAL, CKMB, CKMBINDEX, TROPONINI in the last 168 hours.  BNP (last 3  results) No results for input(s): PROBNP in the last 8760 hours.  Lipid Profile: No results for input(s): CHOL, HDL, LDLCALC, TRIG, CHOLHDL, LDLDIRECT in the last 72 hours.  Thyroid Function Tests: No results for input(s): TSH, T4TOTAL, FREET4, T3FREE, THYROIDAB in the last 72 hours.  Anemia Panel: No results for input(s): VITAMINB12, FOLATE, FERRITIN, TIBC, IRON, RETICCTPCT in the last 72 hours.  Urine analysis:    Component Value Date/Time   COLORURINE YELLOW 05/13/2021 1654   APPEARANCEUR HAZY (A) 05/13/2021 1654   LABSPEC 1.018 05/13/2021 1654   PHURINE 6.0 05/13/2021 1654   GLUCOSEU NEGATIVE 05/13/2021 1654   HGBUR SMALL (A) 05/13/2021 1654   BILIRUBINUR NEGATIVE 05/13/2021 1654   KETONESUR 20 (A) 05/13/2021 1654   PROTEINUR 30 (A) 05/13/2021 1654   NITRITE NEGATIVE 05/13/2021 1654   LEUKOCYTESUR NEGATIVE 05/13/2021 1654    Sepsis Labs: Lactic Acid, Venous    Component Value Date/Time   LATICACIDVEN 1.4 05/13/2021 2025    MICROBIOLOGY: Recent Results (from the past 240 hour(s))  Blood Culture (routine x 2)     Status: None   Collection Time: 05/13/21  5:13 PM   Specimen: BLOOD  Result Value Ref Range Status   Specimen Description BLOOD LEFT ANTECUBITAL  Final   Special Requests   Final    BOTTLES DRAWN AEROBIC AND ANAEROBIC Blood Culture results may not be optimal due to an inadequate volume of blood received in culture bottles   Culture   Final    NO GROWTH 5 DAYS Performed at [facility], [place] [address] [place], [place] [address]    Report Status 05/18/2021 FINAL  Final  Resp Panel by RT-PCR (Flu A&B, Covid) Nasopharyngeal Swab     Status: None   Collection Time: 05/13/21  5:22 PM   Specimen: Nasopharyngeal Swab; Nasopharyngeal(NP) swabs in vial transport medium  Result Value Ref Range Status   SARS Coronavirus 2 by RT PCR NEGATIVE NEGATIVE Final    Comment: (NOTE) SARS-CoV-2 target nucleic acids are NOT DETECTED.  The SARS-CoV-2 RNA is  generally detectable in upper respiratory specimens during the acute phase of infection. The lowest concentration of SARS-CoV-2 viral copies this assay can detect is 138 copies/mL. A negative result does not preclude SARS-Cov-2 infection and should not be used as the sole basis for treatment or other patient management decisions. A negative result may occur with  improper specimen collection/handling, submission of specimen other than nasopharyngeal swab, presence of viral mutation(s) within the areas targeted by this assay, and inadequate number of viral  copies(<138 copies/mL). A negative result must be combined with clinical observations, patient history, and epidemiological information. The expected result is Negative.  Fact Sheet for Patients:  EntrepreneurPulse.com.au  Fact Sheet for Healthcare Providers:  IncredibleEmployment.be  This test is no t yet approved or cleared by the [place] FDA and  has been authorized for detection and/or diagnosis of SARS-CoV-2 by FDA under an Emergency Use Authorization (EUA). This EUA will remain  in effect (meaning this test can be used) for the duration of the COVID-19 declaration under Section 564(b)(1) of the Act, 21 U.S.C.section 360bbb-3(b)(1), unless the authorization is terminated  or revoked sooner.       Influenza A by PCR NEGATIVE NEGATIVE Final   Influenza B by PCR NEGATIVE NEGATIVE Final    Comment: (NOTE) The Xpert Xpress SARS-CoV-2/FLU/RSV plus assay is intended as an aid in the diagnosis of influenza from Nasopharyngeal swab specimens and should not be used as a sole basis for treatment. Nasal washings and aspirates are unacceptable for Xpert Xpress SARS-CoV-2/FLU/RSV testing.  Fact Sheet for Patients: EntrepreneurPulse.com.au  Fact Sheet for Healthcare Providers: IncredibleEmployment.be  This test is not yet approved or cleared by the [place] FDA and has been authorized for detection and/or diagnosis of SARS-CoV-2 by FDA under an Emergency Use Authorization (EUA). This EUA will remain in effect (meaning this test can be used) for the duration of the COVID-19 declaration under Section 564(b)(1) of the Act, 21 U.S.C. section 360bbb-3(b)(1), unless the authorization is terminated or revoked.  Performed at [facility], [place] [address], [place], [place] [address]   Blood Culture (routine x 2)     Status: None   Collection Time: 05/13/21  5:39 PM   Specimen: BLOOD  Result Value Ref Range Status   Specimen Description BLOOD RIGHT ANTECUBITAL  Final   Special Requests   Final    BOTTLES DRAWN AEROBIC AND ANAEROBIC Blood Culture results may not be optimal due to an inadequate volume of blood received in culture bottles   Culture   Final    NO GROWTH 5 DAYS Performed at [facility], [place] [address], [place], [place] [address]    Report Status 05/18/2021 FINAL  Final  Urine Culture     Status: None   Collection Time: 05/15/21  1:16 PM   Specimen: Urine, Clean Catch  Result Value Ref Range Status   Specimen Description URINE, CLEAN CATCH  Final   Special Requests NONE  Final   Culture   Final    NO GROWTH Performed at [facility], [place] [address], [place], [place] [address]    Report Status 05/16/2021 FINAL  Final  MRSA Next Gen by PCR, Nasal     Status: None   Collection Time: 05/15/21  3:54 PM   Specimen: Nasal Mucosa; Nasal Swab  Result Value Ref Range Status   MRSA by PCR Next Gen NOT DETECTED NOT DETECTED Final    Comment: (NOTE) The GeneXpert MRSA Assay (FDA approved for NASAL specimens only), is one component of a comprehensive MRSA colonization surveillance program. It is not intended to diagnose MRSA infection nor to guide or monitor treatment for MRSA infections. Test performance is not FDA approved in patients less than 32 years old. Performed at [facility], [place] [address], [place], [place] [address]   Culture, blood (routine x 2)     Status: None (Preliminary result)   Collection Time: 05/20/21 12:02 PM   Specimen: BLOOD  Result Value Ref Range Status   Specimen Description BLOOD RIGHT ANTECUBITAL  Final   Special Requests   Final    BOTTLES DRAWN AEROBIC AND ANAEROBIC Blood Culture adequate volume   Culture   Final    NO GROWTH 2 DAYS Performed at [facility], [address], [place], [place] [address]    Report Status PENDING  Incomplete  Culture, blood (routine x 2)     Status: None (Preliminary result)   Collection Time: 05/20/21 12:02 PM   Specimen: BLOOD  Result Value Ref Range Status   Specimen Description BLOOD RIGHT ANTECUBITAL  Final   Special Requests   Final    BOTTLES DRAWN AEROBIC AND ANAEROBIC Blood Culture adequate volume   Culture   Final    NO GROWTH 2 DAYS Performed at [facility], [place] [address] [place],  [address]    Report Status PENDING  Incomplete    RADIOLOGY STUDIES/RESULTS: [place] EKG SITE RITE  Result Date: 05/21/2021 If Site Rite image not attached, placement could not be confirmed due to current cardiac rhythm.    LOS: 9 days   Oren Binet, MD  [facility]    To contact the attending provider between 7A-7P or the covering provider during after hours 7P-7A, please log into the web site [email or web address] and access using universal [place] password for that web site. If you do not have the password, please call the hospital operator.  05/22/2021, 2:12 PM

## 2021-05-22 NOTE — Interval H&P Note (Signed)
History and Physical Interval Note:  05/22/2021 8:12 AM  Michael Hines  has presented today for surgery, with the diagnosis of BACTEREMIA.  The various methods of treatment have been discussed with the patient and family. After consideration of risks, benefits and other options for treatment, the patient has consented to  Procedure(s): TRANSESOPHAGEAL ECHOCARDIOGRAM (TEE) (N/A) as a surgical intervention.  The patient's history has been reviewed, patient examined, no change in status, stable for surgery.  I have reviewed the patient's chart and labs.  Questions were answered to the patient's satisfaction.     [name]

## 2021-05-22 NOTE — Progress Notes (Signed)
Telemetry reviewed SR without recurrent SVT Nocturnal bradycardia 40's D/w Dr. Quentin Ore, continue amiodarone gtt today and will revisit tomorrow after TEE  Tommye Standard, PA-C

## 2021-05-22 NOTE — Progress Notes (Addendum)
Pharmacy Antibiotic Note  Michael Hines is a 75 y.o. male admitted on 05/13/2021 with sepsis.    Pt recently complete 4 wks of abx for MSSA bacteremia. TEE on 10/12 now shows endocarditis. ID has ordered a combination of nafcillin/gent/rifampin.   Plan:  Nafcillin 2g IV q4 Rifampin 300mg  IV q8 (PO once intake better) Gentamicin 80mg  IV q8>>peak ~4, trough 0.7, scr 0.8 Levels at steady state  Height: 5\' 7"  (170.2 cm) Weight: 83 kg (183 lb) IBW/kg (Calculated) : 66.1  Temp (24hrs), Avg:98 F (36.7 C), Min:96.7 F (35.9 C), Max:99.1 F (37.3 C)  Recent Labs  Lab 05/18/21 1008 05/19/21 0131 05/20/21 0949 05/21/21 0621 05/22/21 0346  WBC 9.0 7.6 8.6 10.2 9.0  CREATININE 0.83 0.87 0.92 0.95 0.79     Estimated Creatinine Clearance: 84.8 mL/min (by C-G formula based on SCr of 0.79 mg/dL).    No Known Allergies  Antimicrobials this admission: Vancomycin 10/3 x1 Cefepime 10/3 > 10/5 Nafcillin 10/12>> Rifampin 10/12>> Gent 10/12>>  Microbiology results: 9/2 blood>>MSSA 10/3 BCx x2: ng final 10/5 urine>>neg 10/10 blood>>ngtd  Onnie Boer, PharmD, [place], AAHIVP, CPP Infectious Disease Pharmacist 05/22/2021 3:05 PM

## 2021-05-22 NOTE — Consult Note (Signed)
[facility] for Infectious Diseases                                                                                        Patient Identification: Patient Name: Michael Hines MRN: [id number] [place] Date: 05/13/2021  4:36 PM Today's Date: 05/22/2021 Reason for consult:  Requesting provider:   Principal Problem:   Sepsis with acute organ dysfunction 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 Will need 6 weeks of IV abtx  2.   Repeat 2 sets of blood cx today  3.   CT surgery consult given PV endocarditis with rhythm disturbances although may not be candidate given h/o redo sternotomy.  4.   Monitor CBC, CMP, Gentamicin levels  5.   Continue GOC discussion  6.   Discussed with wife/ID pharmacy and primary   Rest of the management as per the primary team. Please call with questions or concerns.  Thank you for the consult  Rosiland Oz, MD Infectious Disease Physician [facility] for Infectious Disease [address]  [place], [place] [address] Phone: [phone]  Fax: [phone]  __________________________________________________________________________________________________________ HPI and Hospital Course: 75 year old male with PMH of DM, parkinsonism, history of aortic valve replacement status post redo in 2013 with recent hospital admission for MSSA bacteremia status post 4 weeks of antibiotics on 05/13/2021 who was admitted on 10/3 from ID clinic given concerns of hypothermia/hypotension and confusion. Patient was admitted 6/8-/3 for complicated MSSA bacteremia of unclear cause. Work up was unremarkable for negative vegetations on TTE/TEE  and negative MRI L spine. He completed 4 weeks of IV cefazolin on 10/3.   At ED, he was afebrile, WBC 9.4, CR 1.67 10/3 blood cultures no growth to date 10/3 MRI brain, limited unremarkable 10/8 MRI brain with punctate acute cortical infarct versus artifact within the anterior right frontal lobe  Hospital course complicated by development of SVT requiring Cardiology consultation. TEE was done.  10/12 TEE endocarditis of the anterior mitral leaflet and subvalvular chordae with a mobile vegetation on the cords.  Diffuse thickening of the bioprosthetic aortic valve leaflets.  Could represent degeneration or endocarditis  ROS: Limted ROS due to confusion   Past Medical History:  Diagnosis Date   Anxiety  B12 deficiency    Back pain    Diabetes mellitus without complication ([place])    Hyperlipidemia    Hypotension 05/13/2021   Hypothermia 05/13/2021   Severe sepsis with septic shock ([place]) 05/13/2021   Tachycardia 05/13/2021   Tachypnea 05/13/2021   Testosterone insufficiency    Past Surgical History:  Procedure Laterality Date   AORTIC VALVE REPLACEMENT  2013   redo bioprosthetic valve at [facility], Dr Mauricio Po   CHOLECYSTECTOMY     STRABISMUS SURGERY     TEE WITHOUT CARDIOVERSION N/A 09/07/2018   Procedure: TRANSESOPHAGEAL ECHOCARDIOGRAM (TEE);  Surgeon:  Acie Fredrickson Wonda Cheng, MD;  Location: [facility] ENDOSCOPY;  Service: Cardiovascular;  Laterality: N/A;   TEE WITHOUT CARDIOVERSION N/A 04/18/2021   Procedure: TRANSESOPHAGEAL ECHOCARDIOGRAM (TEE);  Surgeon: Geralynn Rile, MD;  Location: [facility];  Service: Cardiovascular;  Laterality: N/A;    Scheduled Meds:  amLODipine  5 mg Oral Daily   atorvastatin  40 mg Oral QHS   clotrimazole   Topical BID   enoxaparin (LOVENOX) injection  40 mg Subcutaneous Q24H   fluticasone furoate-vilanterol  1 puff Inhalation Daily   insulin aspart  0-15 Units Subcutaneous TID WC   insulin aspart  0-5 Units Subcutaneous QHS   metoprolol tartrate  2.5 mg Intravenous Q8H   nystatin  5 mL Oral QID   sodium chloride flush  10-40 mL Intracatheter Q12H   Continuous Infusions:  amiodarone 30 mg/hr (05/22/21 0839)   dextrose 5 % and 0.9% NaCl 50 mL/hr at 05/22/21 0608   PRN Meds:.acetaminophen **OR** acetaminophen, ipratropium-albuterol, LORazepam, ondansetron **OR** ondansetron (ZOFRAN) IV, sodium chloride flush  No Known Allergies  Social History   Socioeconomic History   Marital status: Married    Spouse name: Not on file   Number of children: Not on file   Years of education: Not on file   Highest education level: Not on file  Occupational History   Not on file  Tobacco Use   Smoking status: Former    Packs/day: 1.50    Years: 55.00    Pack years: 82.50    Types: Cigarettes    Quit date: 08/24/2018    Years since quitting: 2.7   Smokeless tobacco: Former  Scientific laboratory technician Use: Never used  Substance and Sexual Activity   Alcohol use: Not Currently   Drug use: Never   Sexual activity: Not on file  Other Topics Concern   Not on file  Social History Narrative   Not on file   Social Determinants of Health   Financial Resource Strain: Not on file  Food Insecurity: Not on file  Transportation Needs: Not on file  Physical Activity: Not on file  Stress: Not on file  Social Connections: Not  on file  Intimate Partner Violence: Not on file    Vitals BP (!) 153/90 (BP Location: Right Leg)   Pulse 68   Temp 98 F (36.7 C) (Oral)   Resp 18   Ht 5\' 7"  (1.702 m)   Wt 83 kg   SpO2 95%   BMI 28.66 kg/m    Physical Exam Constitutional:  Lying in bed, not in acute distress    Comments:   Cardiovascular:     Rate and Rhythm: Normal rate and regular rhythm.     Heart sounds: systolic murmur+,   Pulmonary:     Effort: on 3L [place]    Comments:   Abdominal:     Palpations: Abdomen is soft.     Tenderness:  Non tender and non distended   Musculoskeletal:        General: No swelling or tenderness.   Skin:    Comments: chronic skin changes , excoriations in bilateral upper and lower extremities   Neurological:     General: did not lift his lower extremities, unable to assess due to neurologic status  Psychiatric:        Mood and Affect: confused  Pertinent Microbiology Results for orders placed or performed during the hospital encounter of 05/13/21  Blood Culture (routine x 2)     Status: None   Collection Time: 05/13/21  5:13 PM   Specimen: BLOOD  Result Value Ref Range Status   Specimen Description BLOOD LEFT ANTECUBITAL  Final   Special Requests   Final    BOTTLES DRAWN AEROBIC AND ANAEROBIC Blood Culture results may not be optimal due to an inadequate volume of blood received in culture bottles   Culture   Final    NO GROWTH 5 DAYS Performed at [facility], [place] [address], [place], [place] [address]    Report Status 05/18/2021 FINAL  Final  Resp Panel by RT-PCR (Flu A&B, Covid) Nasopharyngeal Swab     Status: None   Collection Time: 05/13/21  5:22 PM   Specimen: Nasopharyngeal Swab; Nasopharyngeal(NP) swabs in vial transport medium  Result Value Ref Range Status   SARS Coronavirus 2 by RT PCR NEGATIVE NEGATIVE Final    Comment: (NOTE) SARS-CoV-2 target nucleic acids are NOT DETECTED.  The SARS-CoV-2 RNA is generally detectable in upper  respiratory specimens during the acute phase of infection. The lowest concentration of SARS-CoV-2 viral copies this assay can detect is 138 copies/mL. A negative result does not preclude SARS-Cov-2 infection and should not be used as the sole basis for treatment or other patient management decisions. A negative result may occur with  improper specimen collection/handling, submission of specimen other than nasopharyngeal swab, presence of viral mutation(s) within the areas targeted by this assay, and inadequate number of viral copies(<138 copies/mL). A negative result must be combined with clinical observations, patient history, and epidemiological information. The expected result is Negative.  Fact Sheet for Patients:  EntrepreneurPulse.com.au  Fact Sheet for Healthcare Providers:  IncredibleEmployment.be  This test is no t yet approved or cleared by the [place] FDA and  has been authorized for detection and/or diagnosis of SARS-CoV-2 by FDA under an Emergency Use Authorization (EUA). This EUA will remain  in effect (meaning this test can be used) for the duration of the COVID-19 declaration under Section 564(b)(1) of the Act, 21 U.S.C.section 360bbb-3(b)(1), unless the authorization is terminated  or revoked sooner.       Influenza A by PCR NEGATIVE NEGATIVE Final   Influenza B by PCR NEGATIVE NEGATIVE Final    Comment: (NOTE) The Xpert Xpress SARS-CoV-2/FLU/RSV plus assay is intended as an aid in the diagnosis of influenza from Nasopharyngeal swab specimens and should not be used as a sole basis for treatment. Nasal washings and aspirates are unacceptable for Xpert Xpress SARS-CoV-2/FLU/RSV testing.  Fact Sheet for Patients: EntrepreneurPulse.com.au  Fact Sheet for Healthcare Providers: IncredibleEmployment.be  This test is not yet approved or cleared by the [place] FDA and has been  authorized for detection and/or diagnosis of SARS-CoV-2 by FDA under an Emergency Use Authorization (EUA). This EUA will remain in effect (meaning this test can be used) for the duration of the COVID-19 declaration under Section 564(b)(1) of the Act, 21 U.S.C. section 360bbb-3(b)(1), unless  the authorization is terminated or revoked.  Performed at [facility], [place] [address] [place], [place] [address]   Blood Culture (routine x 2)     Status: None   Collection Time: 05/13/21  5:39 PM   Specimen: BLOOD  Result Value Ref Range Status   Specimen Description BLOOD RIGHT ANTECUBITAL  Final   Special Requests   Final    BOTTLES DRAWN AEROBIC AND ANAEROBIC Blood Culture results may not be optimal due to an inadequate volume of blood received in culture bottles   Culture   Final    NO GROWTH 5 DAYS Performed at [facility], [place] [address], [place], [place] [address]    Report Status 05/18/2021 FINAL  Final  Urine Culture     Status: None   Collection Time: 05/15/21  1:16 PM   Specimen: Urine, Clean Catch  Result Value Ref Range Status   Specimen Description URINE, CLEAN CATCH  Final   Special Requests NONE  Final   Culture   Final    NO GROWTH Performed at [facility], [place] [address] [place], [place] [address]    Report Status 05/16/2021 FINAL  Final  MRSA Next Gen by PCR, Nasal     Status: None   Collection Time: 05/15/21  3:54 PM   Specimen: Nasal Mucosa; Nasal Swab  Result Value Ref Range Status   MRSA by PCR Next Gen NOT DETECTED NOT DETECTED Final    Comment: (NOTE) The GeneXpert MRSA Assay (FDA approved for NASAL specimens only), is one component of a comprehensive MRSA colonization surveillance program. It is not intended to diagnose MRSA infection nor to guide or monitor treatment for MRSA infections. Test performance is not FDA approved in patients less than 46 years old. Performed at [facility], [place] [address] [place], [place] [address]    Culture, blood (routine x 2)     Status: None (Preliminary result)   Collection Time: 05/20/21 12:02 PM   Specimen: BLOOD  Result Value Ref Range Status   Specimen Description BLOOD RIGHT ANTECUBITAL  Final   Special Requests   Final    BOTTLES DRAWN AEROBIC AND ANAEROBIC Blood Culture adequate volume   Culture   Final    NO GROWTH 2 DAYS Performed at [facility], [place] [address] [place], [place] [address]    Report Status PENDING  Incomplete  Culture, blood (routine x 2)     Status: None (Preliminary result)   Collection Time: 05/20/21 12:02 PM   Specimen: BLOOD  Result Value Ref Range Status   Specimen Description BLOOD RIGHT ANTECUBITAL  Final   Special Requests   Final    BOTTLES DRAWN AEROBIC AND ANAEROBIC Blood Culture adequate volume   Culture   Final    NO GROWTH 2 DAYS Performed at [facility], [place] [address], [place], [place] [address]    Report Status PENDING  Incomplete    Pertinent Lab seen by me: CBC Latest Ref Rng & Units 05/22/2021 05/21/2021 05/20/2021  WBC 4.0 - 10.5 K/uL 9.0 10.2 8.6  Hemoglobin 13.0 - 17.0 g/dL 15.2 15.6 14.6  Hematocrit 39.0 - 52.0 % 45.7 47.2 44.2  Platelets 150 - 400 K/uL 246 251 238   CMP Latest Ref Rng & Units 05/22/2021 05/21/2021 05/20/2021  Glucose 70 - 99 mg/dL 110(H) 120(H) 109(H)  BUN 8 - 23 mg/dL <5(L) <5(L) <5(L)  Creatinine 0.61 - 1.24 mg/dL 0.79 0.95 0.92  Sodium 135 - 145 mmol/L 132(L) 136 135  Potassium 3.5 - 5.1 mmol/L 3.3(L) 3.5 3.6  Chloride 98 - 111 mmol/L 98 99 99  CO2 22 - 32 mmol/L 25 28 28   Calcium 8.9 - 10.3 mg/dL 8.4(L) 8.6(L) 8.6(L)  Total Protein 6.5 - 8.1 g/dL - - -  Total Bilirubin 0.3 - 1.2 mg/dL - - -  Alkaline Phos 38 - 126 U/L - - -  AST 15 - 41 U/L - - -  ALT 0 - 44 U/L - - -     Pertinent Imagings/Other Imagings Plain films and CT images have been personally visualized and interpreted; radiology reports have been reviewed. Decision making incorporated into the Impression /  Recommendations.  TEE 05/22/21   IMPRESSION:    Endocarditis of the anterior mitral leaflet and subvalvular chordae with a mobile vegetation on the cords Diffuse thickening of the bioprosthetic aortic valve leaflets - could represent degeneration or endocarditis. Mild AI - probably mild AS. No LAA thrombus Dilated proximal aortic arch to 39 mm (most proximal visualized portion) LVEF 60-65%, normal wall motion  MRI brain 05/18/21 IMPRESSION: Intermittently motion degraded examination, as described and limiting evaluation.   Punctate acute cortical infarct versus artifact within the anterior right frontal lobe.   Mild chronic small vessel ischemic changes within the cerebral white matter and pons, similar as compared to the brain MRI of 02/06/2020.   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   Central predominant cerebral atrophy. Mild cerebellar atrophy is also present.   Trace fluid within the bilateral mastoid air cells.  I spent more than 70  minutes for this patient encounter including review of prior medical records/discussing diagnostics and treatment plan with the patient/family/coordinate care with primary/other specialits with greater than 50% of time in face to face encounter.   Electronically signed by:   Rosiland Oz, MD Infectious Disease Physician [facility] for Infectious Disease Pager: [phone]

## 2021-05-22 NOTE — Progress Notes (Signed)
Progress Note  Patient Name: Michael Hines Date of Encounter: 05/22/2021  [facility] Cardiologist: None   Subjective   Sedated after TEE. Unfortunately has evidence of endocarditis.  Inpatient Medications    Scheduled Meds:  amLODipine  5 mg Oral Daily   atorvastatin  40 mg Oral QHS   clotrimazole   Topical BID   enoxaparin (LOVENOX) injection  40 mg Subcutaneous Q24H   fluticasone furoate-vilanterol  1 puff Inhalation Daily   insulin aspart  0-15 Units Subcutaneous TID WC   insulin aspart  0-5 Units Subcutaneous QHS   metoprolol tartrate  2.5 mg Intravenous Q8H   nystatin  5 mL Oral QID   potassium chloride  40 mEq Oral Once   sodium chloride flush  10-40 mL Intracatheter Q12H   Continuous Infusions:  amiodarone 30 mg/hr (05/22/21 0839)   dextrose 5 % and 0.9% NaCl 50 mL/hr at 05/22/21 0608   PRN Meds: acetaminophen **OR** acetaminophen, ipratropium-albuterol, LORazepam, ondansetron **OR** ondansetron (ZOFRAN) IV, sodium chloride flush   Vital Signs    Vitals:   05/22/21 0910 05/22/21 0919 05/22/21 0929 05/22/21 0952  BP: 106/76 102/81 (!) 153/81 (!) 172/92  Pulse: 68 63 67 63  Resp: '19 20 14 20  ' Temp: (!) 96.7 F (35.9 C)   97.8 F (36.6 C)  TempSrc: Temporal   Axillary  SpO2: 98% 98% 99%   Weight:      Height:        Intake/Output Summary (Last 24 hours) at 05/22/2021 0958 Last data filed at 05/22/2021 0904 Gross per 24 hour  Intake 3158.43 ml  Output 2400 ml  Net 758.43 ml   Last 3 Weights 05/22/2021 05/22/2021 05/22/2021  Weight (lbs) 183 lb 185 lb 13.6 oz 185 lb 6.5 oz  Weight (kg) 83.008 kg 84.3 kg 84.1 kg      Telemetry    Moderate sinus bradycardia while asleep, no SVT - Personally Reviewed  ECG    No new tracing - Personally Reviewed  Physical Exam  sedated GEN: No acute distress.   Neck: No JVD Cardiac: RRR, no murmurs, rubs, or gallops.  Respiratory: Clear to auscultation bilaterally. GI: Soft, nontender, non-distended   MS: No edema; No deformity. Neuro:  Nonfocal  Psych: Normal affect   Labs    High Sensitivity Troponin:   Recent Labs  Lab 05/16/21 1639 05/16/21 1830  TROPONINIHS 24* 21*     Chemistry Recent Labs  Lab 05/18/21 1008 05/19/21 0131 05/20/21 0949 05/21/21 0621 05/22/21 0346  NA 131* 132* 135 136 132*  K 3.3* 3.5 3.6 3.5 3.3*  CL 97* 97* 99 99 98  CO2 '25 27 28 28 25  ' GLUCOSE 112* 103* 109* 120* 110*  BUN <5* <5* <5* <5* <5*  CREATININE 0.83 0.87 0.92 0.95 0.79  CALCIUM 8.4* 8.7* 8.6* 8.6* 8.4*  MG 1.5* 2.0  --  1.7  --   GFRNONAA >60 >60 >60 >60 >60  ANIONGAP '9 8 8 9 9    ' Lipids  Recent Labs  Lab 05/18/21 1903  CHOL 201*  TRIG 131  HDL 32*  LDLCALC 143*  CHOLHDL 6.3    Hematology Recent Labs  Lab 05/20/21 0949 05/21/21 0621 05/22/21 0346  WBC 8.6 10.2 9.0  RBC 4.74 5.13 4.95  HGB 14.6 15.6 15.2  HCT 44.2 47.2 45.7  MCV 93.2 92.0 92.3  MCH 30.8 30.4 30.7  MCHC 33.0 33.1 33.3  RDW 14.4 14.6 14.4  PLT 238 251 246   Thyroid  Recent Labs  Lab 05/15/21 1227  TSH 1.957    BNPNo results for input(s): BNP, PROBNP in the last 168 hours.  DDimer No results for input(s): DDIMER in the last 168 hours.   Radiology    [place] EKG SITE RITE  Result Date: 05/21/2021 If Site Rite image not attached, placement could not be confirmed due to current cardiac rhythm.   Cardiac Studies   TEE today shows new vegetations on mitral valve  Patient Profile     75 y.o. 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 The blood cultures may remain negative since he was receiving antibiotics when they were drawn, but it is reasonable to assume that MSSA identified 04/12/2021 is the  culprit. Not a candidate for third sternotomy. Plan extended IV antibiotics (6 weeks) and recommend lifelong suppressive oral antibiotic. Can use ESR or CRP to track progress. SVT: well controlled on IV amio, moderate sinus bradycardia. Would transition to PO when he cooperates with oral meds. 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 No hemodynamic indication for surgery. DM: recent A1c relatively high at 7.9%.     For questions or updates, please contact [place] Please consult [email or web address] for contact info under        Signed, Sanda Klein, MD  05/22/2021, 9:58 AM

## 2021-05-22 NOTE — Transfer of Care (Signed)
Immediate Anesthesia Transfer of Care Note  Patient: DEANE WATTENBARGER  Procedure(s) Performed: TRANSESOPHAGEAL ECHOCARDIOGRAM (TEE)  Patient Location: Endoscopy Unit  Anesthesia Type:MAC  Level of Consciousness: drowsy  Airway & Oxygen Therapy: Patient Spontanous Breathing and Patient connected to face mask oxygen  Post-op Assessment: Report given to RN and Post -op Vital signs reviewed and stable  Post vital signs: Reviewed and stable  Last Vitals:  Vitals Value Taken Time  BP 106/76 05/22/21 0910  Temp    Pulse 68 05/22/21 0910  Resp 19 05/22/21 0910  SpO2 99 % 05/22/21 0910  Vitals shown include unvalidated device data.  Last Pain:  Vitals:   05/22/21 0910  TempSrc:   PainSc: 0-No pain         Complications: No notable events documented.

## 2021-05-22 NOTE — CV Procedure (Signed)
TRANSESOPHAGEAL ECHOCARDIOGRAM (TEE) NOTE  INDICATIONS: bioprosthetic valve infective endocarditis  PROCEDURE:   Informed consent was obtained prior to the procedure. The risks, benefits and alternatives for the procedure were discussed and the patient comprehended these risks.  Risks include, but are not limited to, cough, sore throat, vomiting, nausea, somnolence, esophageal and stomach trauma or perforation, bleeding, low blood pressure, aspiration, pneumonia, infection, trauma to the teeth and death.    After a procedural time-out, the patient was given propofol for sedation by anesthesia.  The patient's heart rate, blood pressure, and oxygen saturation are monitored continuously during the procedure.The oropharynx was anesthetized with 1 topical cetacaine spray.  The transesophageal probe was inserted in the esophagus and stomach without difficulty and multiple views were obtained.  The patient was kept under observation until the patient left the procedure room. I was present face-to-face 100% of this time. The patient left the procedure room in stable condition.   Agitated microbubble saline contrast was not administered.  COMPLICATIONS:    There were no immediate complications.  Findings:  LEFT VENTRICLE: The left ventricular wall thickness is mildly increased.  The left ventricular cavity is normal in size. Wall motion is normal.  LVEF is 60-65%.  RIGHT VENTRICLE:  The right ventricle appears mildly hypokinetic and is normal size.  LEFT ATRIUM:  The left atrium is normal in size without any thrombus or masses.  There is not spontaneous echo contrast ("smoke") in the left atrium consistent with a low flow state.  LEFT ATRIAL APPENDAGE:  The left atrial appendage is free of any thrombus or masses. The appendage has single lobes. Pulse doppler indicates moderate flow in the appendage.  ATRIAL SEPTUM:  The atrial septum appears intact and is free of thrombus and/or masses.  There  is no evidence for interatrial shunting by color doppler and saline microbubble.  RIGHT ATRIUM:  The right atrium is normal size in size and function without any thrombus or masses.  MITRAL VALVE:  The mitral valve is abnormal with thickening of the anterior mitral leaflet and subvalvular thickening of the chordae. There is a mobile mass associated with the chords to the anterior leaflet consistent with vegetation. There is  trivial  regurgitation.  There is no stenosis.  AORTIC VALVE:  The aortic valve is a bioprosthetic 21 mm valve. The valve is trileaflet, however, the leaflets are diffusely thickened with restricted motion - there is Mild regurgitation.  There is likely mild stenosis of the valve- mean gradient however, only measured 3 mmHg (could not align the transducer)  TRICUSPID VALVE:  The tricuspid valve is normal in structure and function with  trivial  regurgitation.  There were no vegetations or stenosis   PULMONIC VALVE:  The pulmonic valve is normal in structure and function with  no  regurgitation.  There were no vegetations or stenosis.   AORTIC ARCH, ASCENDING AND DESCENDING AORTA:  There was grade 2 Ron Parker et. Al, 1992) atherosclerosis of the ascending aorta, aortic arch, or proximal descending aorta. The ascending aorta was not well visualized, however, the proximal aortic arch measures 39 mm (previously reported 48 mm ascending aortic aneurysm)  12. PULMONARY VEINS: Anomalous pulmonary venous return was not noted.  13. PERICARDIUM: The pericardium appeared normal and non-thickened.  There is no pericardial effusion.  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 Mild  AI - probably mild AS. No LAA thrombus Dilated proximal aortic arch to 39 mm (most proximal visualized portion) LVEF 60-65%,  normal wall motion  RECOMMENDATIONS:    Evidence of endocarditis of the AMVL and chordae, which was not previously noted on direct comparison with images from 04/2021. Cannot exclude bioprosthetic aortic valve endocarditis.  Time Spent Directly with the Patient:  60 minutes   Michael Casino, MD, [facility], [place] Director of the [facility] Diplomate of the American Board of Clinical Lipidology Attending Cardiologist  Direct Dial: [phone]  Fax: [phone]  Website:  [email or web address]  Michael Hines 05/22/2021, 9:22 AM

## 2021-05-22 NOTE — Anesthesia Procedure Notes (Signed)
Procedure Name: MAC Date/Time: 05/22/2021 8:51 AM Performed by: Imagene Riches, CRNA Pre-anesthesia Checklist: Patient identified, Emergency Drugs available, Suction available, Patient being monitored and Timeout performed Patient Re-evaluated:Patient Re-evaluated prior to induction Oxygen Delivery Method: Simple face mask

## 2021-05-22 NOTE — Anesthesia Postprocedure Evaluation (Signed)
Anesthesia Post Note  Patient: Michael Hines  Procedure(s) Performed: TRANSESOPHAGEAL ECHOCARDIOGRAM (TEE)     Patient location during evaluation: PACU Anesthesia Type: MAC Level of consciousness: awake and alert and oriented Pain management: pain level controlled Vital Signs Assessment: post-procedure vital signs reviewed and stable Respiratory status: spontaneous breathing, nonlabored ventilation and respiratory function stable Cardiovascular status: blood pressure returned to baseline Postop Assessment: no apparent nausea or vomiting Anesthetic complications: no   No notable events documented.  Last Vitals:  Vitals:   05/22/21 0929 05/22/21 0952  BP: (!) 153/81 (!) 172/92  Pulse: 67 63  Resp: 14 20  Temp:  36.6 C  SpO2: 99%     Last Pain:  Vitals:   05/22/21 0952  TempSrc: Axillary  PainSc: 0-No pain                 Marthenia Rolling

## 2021-05-22 NOTE — Progress Notes (Signed)
Occupational Therapy Treatment Patient Details Name: Michael EHRESMAN MRN: [id number] DOB: 10/29/1945 Today's Date: 05/22/2021   History of present illness 75 y.o. male presents to [facility] hospital on 05/13/2021 from ID clinic due to hypothermia and hypotension. Pt with recent hospitalization in September for MSSA septicemia, was discharged to SNF. Blood cultures negative this admission. PMH includes COPD, type 2 diabetes, hyperlipidemia, bioprosthetic aortic valve replacement.   OT comments  Pt refusing most mobility/movement and ADL's, complaining about therapy always bothering him. Pt able to roll in bed, come to sitting, and assist with pulling himself up in bed with min-mod A. Pt continues to have decreased safety awareness and recall, as well as difficulty maintaining attention. OT will follow up acutely.    Recommendations for follow up therapy are one component of a multi-disciplinary discharge planning process, led by the attending physician.  Recommendations may be updated based on patient status, additional functional criteria and insurance authorization.    Follow Up Recommendations  SNF    Equipment Recommendations  None recommended by OT    Recommendations for Other Services      Precautions / Restrictions Precautions Precautions: Fall Restrictions Weight Bearing Restrictions: No       Mobility Bed Mobility Overal bed mobility: Needs Assistance Bed Mobility: Rolling;Sidelying to Sit;Sit to Supine Rolling: Min assist (with rail) Sidelying to sit: HOB elevated;Mod assist (with rail)   Sit to supine: Min assist   General bed mobility comments: assist raising torso to sit EOB; pt required assistance with BLE to return to supine    Transfers                 General transfer comment: unwilling to attempt    Balance Overall balance assessment: Needs assistance Sitting-balance support: Bilateral upper extremity supported;Feet unsupported Sitting balance-Leahy  Scale: Poor Sitting balance - Comments: mild posterior lean Postural control: Posterior lean                                 ADL either performed or assessed with clinical judgement   ADL Overall ADL's : Needs assistance/impaired                                       General ADL Comments: Session focused on bed mobility as pt was resistive to all ADL's     Vision       Perception     Praxis      Cognition Arousal/Alertness: Awake/alert Behavior During Therapy: Impulsive Overall Cognitive Status: Impaired/Different from baseline Area of Impairment: Orientation;Attention;Following commands;Memory;Safety/judgement;Awareness;Problem solving                 Orientation Level: Disoriented to;Time;Situation Current Attention Level: Sustained Memory: Decreased short-term memory Following Commands: Follows one step commands inconsistently;Follows one step commands with increased time Safety/Judgement: Decreased awareness of safety;Decreased awareness of deficits   Problem Solving: Slow processing;Requires verbal cues;Requires tactile cues General Comments: pt aware he is sick and in hospital; following commands with multi-modal cues and incr time; easily frustrated and cut session short        Exercises     Shoulder Instructions       General Comments Wife present and supportive    Pertinent Vitals/ Pain       Pain Assessment: No/denies pain  Home Living  Prior Functioning/Environment              Frequency  Min 2X/week        Progress Toward Goals  OT Goals(current goals can now be found in the care plan section)  Progress towards OT goals: Not progressing toward goals - comment  Acute Rehab OT Goals Patient Stated Goal: wife wants pt to get well and be able to go home after rehab OT Goal Formulation: With family Time For Goal Achievement:  05/29/21 Potential to Achieve Goals: Fair ADL Goals Pt Will Perform Eating: with min assist;with adaptive utensils;sitting Pt Will Perform Grooming: with mod assist;sitting Pt Will Perform Upper Body Bathing: with mod assist;sitting Additional ADL Goal #1: Pt will actively participate in 10 mins therapeutic activity with min cues  Plan Discharge plan remains appropriate    Co-evaluation                 AM-PAC OT "6 Clicks" Daily Activity     Outcome Measure   Help from another person eating meals?: A Little Help from another person taking care of personal grooming?: A Lot Help from another person toileting, which includes using toliet, bedpan, or urinal?: Total Help from another person bathing (including washing, rinsing, drying)?: Total Help from another person to put on and taking off regular upper body clothing?: A Lot Help from another person to put on and taking off regular lower body clothing?: A Lot 6 Click Score: 11    End of Session Equipment Utilized During Treatment: Oxygen  OT Visit Diagnosis: Cognitive communication deficit (R41.841)   Activity Tolerance Patient limited by lethargy (Pt resistive to much mobility/ADL's)   Patient Left in bed;with call bell/phone within reach;with bed alarm set;with family/visitor present   Nurse Communication Mobility status        Time: 1643-1700 OT Time Calculation (min): 17 min  Charges: OT General Charges $OT Visit: 1 Visit OT Treatments $Therapeutic Activity: 8-22 mins  Kourtney Montesinos H., OTR/L Acute Rehabilitation  Skylor Schnapp Elane Yolanda Bonine 05/22/2021, 5:57 PM

## 2021-05-22 NOTE — Progress Notes (Signed)
  Echocardiogram Echocardiogram Transesophageal has been performed.  Michael Hines 05/22/2021, 9:24 AM

## 2021-05-23 DIAGNOSIS — I669 Occlusion and stenosis of unspecified cerebral artery: Secondary | ICD-10-CM | POA: Diagnosis not present

## 2021-05-23 DIAGNOSIS — I330 Acute and subacute infective endocarditis: Secondary | ICD-10-CM | POA: Diagnosis not present

## 2021-05-23 DIAGNOSIS — T826XXD Infection and inflammatory reaction due to cardiac valve prosthesis, subsequent encounter: Secondary | ICD-10-CM

## 2021-05-23 DIAGNOSIS — A419 Sepsis, unspecified organism: Secondary | ICD-10-CM | POA: Diagnosis not present

## 2021-05-23 DIAGNOSIS — I76 Septic arterial embolism: Secondary | ICD-10-CM | POA: Diagnosis not present

## 2021-05-23 DIAGNOSIS — N179 Acute kidney failure, unspecified: Secondary | ICD-10-CM | POA: Diagnosis not present

## 2021-05-23 DIAGNOSIS — E1142 Type 2 diabetes mellitus with diabetic polyneuropathy: Secondary | ICD-10-CM | POA: Diagnosis not present

## 2021-05-23 DIAGNOSIS — Z952 Presence of prosthetic heart valve: Secondary | ICD-10-CM | POA: Diagnosis not present

## 2021-05-23 LAB — C-REACTIVE PROTEIN: CRP: 4.1 mg/dL — ABNORMAL HIGH (ref <1.0)

## 2021-05-23 LAB — COMPREHENSIVE METABOLIC PANEL
ALT: 10 U/L (ref 0–44)
AST: 17 U/L (ref 15–41)
Albumin: 2.5 g/dL — ABNORMAL LOW (ref 3.5–5.0)
Alkaline Phosphatase: 102 U/L (ref 38–126)
Anion gap: 10 (ref 5–15)
BUN: <5 mg/dL — ABNORMAL LOW (ref 8–23)
CO2: 25 mmol/L (ref 22–32)
Calcium: 8.4 mg/dL — ABNORMAL LOW (ref 8.9–10.3)
Chloride: 101 mmol/L (ref 98–111)
Creatinine, Ser: 1 mg/dL (ref 0.61–1.24)
GFR, Estimated: >60 mL/min (ref >60)
Glucose, Bld: 100 mg/dL — ABNORMAL HIGH (ref 70–99)
Potassium: 3 mmol/L — ABNORMAL LOW (ref 3.5–5.1)
Sodium: 136 mmol/L (ref 135–145)
Total Bilirubin: 2.2 mg/dL — ABNORMAL HIGH (ref 0.3–1.2)
Total Protein: 5.8 g/dL — ABNORMAL LOW (ref 6.5–8.1)

## 2021-05-23 LAB — CBC
HCT: 46.5 % (ref 39.0–52.0)
Hemoglobin: 15.2 g/dL (ref 13.0–17.0)
MCH: 30.3 pg (ref 26.0–34.0)
MCHC: 32.7 g/dL (ref 30.0–36.0)
MCV: 92.8 fL (ref 80.0–100.0)
Platelets: 235 10*3/uL (ref 150–400)
RBC: 5.01 MIL/uL (ref 4.22–5.81)
RDW: 14.8 % (ref 11.5–15.5)
WBC: 9.7 10*3/uL (ref 4.0–10.5)
nRBC: 0 % (ref 0.0–0.2)

## 2021-05-23 LAB — GLUCOSE, CAPILLARY
Glucose-Capillary: 118 mg/dL — ABNORMAL HIGH (ref 70–99)
Glucose-Capillary: 117 mg/dL — ABNORMAL HIGH (ref 70–99)
Glucose-Capillary: 158 mg/dL — ABNORMAL HIGH (ref 70–99)
Glucose-Capillary: 130 mg/dL — ABNORMAL HIGH (ref 70–99)

## 2021-05-23 LAB — SEDIMENTATION RATE: Sed Rate: 13 mm/hr (ref 0–16)

## 2021-05-23 LAB — MAGNESIUM: Magnesium: 1.6 mg/dL — ABNORMAL LOW (ref 1.7–2.4)

## 2021-05-23 MED ORDER — SODIUM CHLORIDE 0.9 % IV SOLN
12.0000 g | INTRAVENOUS | Status: DC
  Administered 2021-05-23 – 2021-05-26 (×4): 12 g via INTRAVENOUS
  Filled 2021-05-23 (×4): qty 12000

## 2021-05-23 MED ORDER — MELATONIN 5 MG PO TABS
5.0000 mg | ORAL_TABLET | Freq: Every evening | ORAL | Status: DC | PRN
  Administered 2021-05-23 – 2021-05-27 (×3): 5 mg via ORAL
  Filled 2021-05-23 (×3): qty 1

## 2021-05-23 MED ORDER — POTASSIUM CHLORIDE 20 MEQ PO PACK
40.0000 meq | PACK | ORAL | Status: AC
  Administered 2021-05-23: 40 meq via ORAL
  Filled 2021-05-23: qty 2

## 2021-05-23 MED ORDER — AMIODARONE HCL 200 MG PO TABS
200.0000 mg | ORAL_TABLET | Freq: Every day | ORAL | Status: DC
  Administered 2021-05-23 – 2021-05-26 (×4): 200 mg via ORAL
  Filled 2021-05-23 (×5): qty 1

## 2021-05-23 MED ORDER — METOPROLOL TARTRATE 25 MG PO TABS
25.0000 mg | ORAL_TABLET | Freq: Two times a day (BID) | ORAL | Status: DC
  Administered 2021-05-23 – 2021-05-26 (×6): 25 mg via ORAL
  Filled 2021-05-23 (×8): qty 1

## 2021-05-23 MED ORDER — MAGNESIUM SULFATE 4 GM/100ML IV SOLN
4.0000 g | Freq: Once | INTRAVENOUS | Status: AC
  Administered 2021-05-23: 4 g via INTRAVENOUS
  Filled 2021-05-23: qty 100

## 2021-05-23 MED ORDER — RIFAMPIN 300 MG PO CAPS
300.0000 mg | ORAL_CAPSULE | Freq: Three times a day (TID) | ORAL | Status: DC
  Administered 2021-05-23 – 2021-05-26 (×9): 300 mg via ORAL
  Filled 2021-05-23 (×12): qty 1

## 2021-05-23 NOTE — Progress Notes (Signed)
Physical Therapy Treatment Patient Details Name: Michael Hines MRN: [id number] DOB: Aug 01, 1946 Today's Date: 05/23/2021   History of Present Illness 75 y.o. male presents to [facility] hospital on 05/13/2021 from ID clinic due to hypothermia and hypotension. Pt with recent hospitalization in September for MSSA septicemia, was discharged to SNF. Blood cultures negative this admission. Developed tachyarrythmia-resolved with meds.  PMH includes COPD, type 2 diabetes, hyperlipidemia, bioprosthetic aortic valve replacement.    PT Comments    Patient remains restless (after ativan as RN reports he was agitated earlier today) and only safe to perform LE exercises and stretches in supine today. Following commands better than previous days, although sleepy. Will continue to work towards goals.     Recommendations for follow up therapy are one component of a multi-disciplinary discharge planning process, led by the attending physician.  Recommendations may be updated based on patient status, additional functional criteria and insurance authorization.  Follow Up Recommendations  SNF     Equipment Recommendations  Wheelchair (measurements PT);Wheelchair cushion (measurements PT);Hospital bed;3in1 (PT) (hoyer lift)    Recommendations for Other Services       Precautions / Restrictions Precautions Precautions: Fall Precaution Comments: Pt confused and restless/irritable     Mobility  Bed Mobility               General bed mobility comments: 2 person assist to center in bed and move up to [facility] EOB deferred due to lethargy with sudden bursts of irritableness    Transfers                 General transfer comment: unwilling to attempt  Ambulation/Gait                 Stairs             Wheelchair Mobility    Modified Rankin (Stroke Patients Only)       Balance                                            Cognition Arousal/Alertness:  Lethargic;Suspect due to medications (RN gave ativan 2 hrs earlier due to agitation) Behavior During Therapy: Impulsive Overall Cognitive Status: Impaired/Different from baseline Area of Impairment: Orientation;Attention;Following commands;Memory;Safety/judgement;Awareness;Problem solving                 Orientation Level: Disoriented to;Time;Situation Current Attention Level: Focused Memory: Decreased short-term memory Following Commands: Follows one step commands inconsistently;Follows one step commands with increased time Safety/Judgement: Decreased awareness of safety;Decreased awareness of deficits   Problem Solving: Slow processing;Requires verbal cues;Requires tactile cues;Difficulty sequencing        Exercises General Exercises - Lower Extremity Ankle Circles/Pumps: Both;PROM;5 reps Heel Slides: AAROM;Strengthening;Both;5 reps (resisted extension) Hip ABduction/ADduction: AAROM;Both;5 reps Other Exercises Other Exercises: hooklying (single leg) with internal rotation/adduction stretch to each hip x 60 seconds x 2 reps (pt tends to rest in "frog leg" position in bed and Left>rt hip tightness developing. Able to achieve neutral rotation and adduction across midline    General Comments General comments (skin integrity, edema, etc.): Wife present. Reports pt was agitated earlier today and they medicated pt and donned bil mitts      Pertinent Vitals/Pain Pain Assessment: Faces Faces Pain Scale: No hurt    Home Living  Prior Function            PT Goals (current goals can now be found in the care plan section) Acute Rehab PT Goals Patient Stated Goal: wife wants pt to get well and be able to go home after rehab Time For Goal Achievement: 05/30/21 Potential to Achieve Goals: Fair Progress towards PT goals: Not progressing toward goals - comment (continue confusion; agitation)    Frequency    Min 2X/week      PT Plan Current  plan remains appropriate    Co-evaluation              AM-PAC PT "6 Clicks" Mobility   Outcome Measure  Help needed turning from your back to your side while in a flat bed without using bedrails?: A Little Help needed moving from lying on your back to sitting on the side of a flat bed without using bedrails?: A Little Help needed moving to and from a bed to a chair (including a wheelchair)?: Total Help needed standing up from a chair using your arms (e.g., wheelchair or bedside chair)?: Total Help needed to walk in hospital room?: Total Help needed climbing 3-5 steps with a railing? : Total 6 Click Score: 10    End of Session Equipment Utilized During Treatment: Oxygen Activity Tolerance: Treatment limited secondary to medical complications (Comment) (confusion; restless/irritable) Patient left: in bed;with call bell/phone within reach;with bed alarm set;with family/visitor present (IV team)   PT Visit Diagnosis: Muscle weakness (generalized) (M62.81);Adult, failure to thrive (R62.7)     Time: 3295-1884 PT Time Calculation (min) (ACUTE ONLY): 24 min  Charges:  $Therapeutic Exercise: 8-22 mins                      Michael Hines, PT Acute Rehabilitation Services  Pager [phone] Office [phone]    Rexanne Mano 05/23/2021, 4:38 PM

## 2021-05-23 NOTE — Progress Notes (Signed)
Progress Note  Patient Name: Michael Hines Date of Encounter: 05/23/2021  [facility] Cardiologist: None   Subjective   Much more, but remains intermittently confused words "all over" but no specific chest pain.  Denies dyspnea. No new tachyarrhythmia on monitor. Swallowing some pills, crushed in pure.  Inpatient Medications    Scheduled Meds:  amLODipine  5 mg Oral Daily   atorvastatin  40 mg Oral QHS   clotrimazole   Topical BID   enoxaparin (LOVENOX) injection  40 mg Subcutaneous Q24H   fluticasone furoate-vilanterol  1 puff Inhalation Daily   insulin aspart  0-15 Units Subcutaneous TID WC   insulin aspart  0-5 Units Subcutaneous QHS   metoprolol tartrate  2.5 mg Intravenous Q8H   nystatin  5 mL Oral QID   potassium chloride  40 mEq Oral Q4H   sodium chloride flush  10-40 mL Intracatheter Q12H   Continuous Infusions:  amiodarone 30 mg/hr (05/23/21 0608)   dextrose 5 % and 0.9% NaCl 50 mL/hr at 05/22/21 0608   gentamicin 80 mg (05/23/21 0827)   magnesium sulfate bolus IVPB     nafcillin IV 2 g (05/23/21 0905)   rifampin (RIFADIN) IVPB 300 mg (05/23/21 0530)   PRN Meds: acetaminophen **OR** acetaminophen, ipratropium-albuterol, LORazepam, ondansetron **OR** ondansetron (ZOFRAN) IV, sodium chloride flush   Vital Signs    Vitals:   05/22/21 2328 05/23/21 0400 05/23/21 0410 05/23/21 0804  BP: (!) 156/92 125/85  139/80  Pulse:  95  83  Resp:  (!) 24  18  Temp: 97.8 F (36.6 C) 98.9 F (37.2 C)    TempSrc: Oral Oral    SpO2:  97%  94%  Weight:   84.8 kg   Height:        Intake/Output Summary (Last 24 hours) at 05/23/2021 0956 Last data filed at 05/23/2021 0406 Gross per 24 hour  Intake --  Output 1475 ml  Net -1475 ml   Last 3 Weights 05/23/2021 05/22/2021 05/22/2021  Weight (lbs) 186 lb 15.2 oz 183 lb 185 lb 13.6 oz  Weight (kg) 84.8 kg 83.008 kg 84.3 kg      Telemetry    Sinus rhythm, occasional PVCs- Personally Reviewed  ECG    No  new tracing- Personally Reviewed  Physical Exam  Alert, partially oriented GEN: No acute distress.   Neck: No JVD Cardiac: RRR, no murmurs, rubs, or gallops.  Respiratory: Clear to auscultation bilaterally. GI: Soft, nontender, non-distended  MS: No edema; No deformity. Neuro:  Nonfocal  Psych: Normal affect   Labs    High Sensitivity Troponin:   Recent Labs  Lab 05/16/21 1639 05/16/21 1830  TROPONINIHS 24* 21*     Chemistry Recent Labs  Lab 05/19/21 0131 05/20/21 0949 05/21/21 0621 05/22/21 0346 05/23/21 0331  NA 132*   < > 136 132* 136  K 3.5   < > 3.5 3.3* 3.0*  CL 97*   < > 99 98 101  CO2 27   < > '28 25 25  ' GLUCOSE 103*   < > 120* 110* 100*  BUN <5*   < > <5* <5* <5*  CREATININE 0.87   < > 0.95 0.79 1.00  CALCIUM 8.7*   < > 8.6* 8.4* 8.4*  MG 2.0  --  1.7  --  1.6*  PROT  --   --   --   --  5.8*  ALBUMIN  --   --   --   --  2.5*  AST  --   --   --   --  17  ALT  --   --   --   --  10  ALKPHOS  --   --   --   --  102  BILITOT  --   --   --   --  2.2*  GFRNONAA >60   < > >60 >60 >60  ANIONGAP 8   < > '9 9 10   ' < > = values in this interval not displayed.    Lipids  Recent Labs  Lab 05/18/21 1903  CHOL 201*  TRIG 131  HDL 32*  LDLCALC 143*  CHOLHDL 6.3    Hematology Recent Labs  Lab 05/21/21 0621 05/22/21 0346 05/23/21 0331  WBC 10.2 9.0 9.7  RBC 5.13 4.95 5.01  HGB 15.6 15.2 15.2  HCT 47.2 45.7 46.5  MCV 92.0 92.3 92.8  MCH 30.4 30.7 30.3  MCHC 33.1 33.3 32.7  RDW 14.6 14.4 14.8  PLT 251 246 235   Thyroid No results for input(s): TSH, FREET4 in the last 168 hours.  BNPNo results for input(s): BNP, PROBNP in the last 168 hours.  DDimer No results for input(s): DDIMER in the last 168 hours.   Radiology    ECHO TEE  Result Date: 05/22/2021    TRANSESOPHOGEAL ECHO REPORT   Patient Name:   Michael Hines Date of Exam: 05/22/2021 Medical Rec #:  [id number]         Height:       67.0 in Accession #:    [id number]        Weight:        185.8 lb Date of Birth:  October 12, 1945         BSA:          1.960 m Patient Age:    30 years          BP:           144/72 mmHg Patient Gender: M                 HR:           77 bpm. Exam Location:  Inpatient Procedure: 3D Echo, Transesophageal Echo, Cardiac Doppler and Color Doppler Indications:     Bacteremia  History:         Patient has prior history of Echocardiogram examinations.                  Abnormal ECG, COPD, Aortic Valve Disease, Arrythmias:SVT,                  Signs/Symptoms:Hypotension; Risk Factors:Diabetes.                  Aortic Valve: 21 mm bioprosthetic valve is present in the                  aortic position.  Sonographer:     Roseanna Rainbow RDCS Referring Phys:  [id number] Darreld Mclean Diagnosing Phys: Lyman Bishop MD PROCEDURE: After discussion of the risks and benefits of a TEE, an informed consent was obtained from the patient. The transesophogeal probe was passed without difficulty through the esophogus of the patient. Imaged were obtained with the patient in a supine position. Sedation performed by different physician. The patient was monitored while under deep sedation. Anesthestetic sedation was provided intravenously by Anesthesiology: 127m of Propofol. The patient's vital signs; including heart rate, blood pressure, and oxygen saturation;  remained stable throughout the procedure. The patient developed no complications during the procedure. IMPRESSIONS  1. Left ventricular ejection fraction, by estimation, is 60 to 65%. The left ventricle has normal function. There is mild left ventricular hypertrophy.  2. Right ventricular systolic function is mildly reduced. The right ventricular size is normal.  3. No left atrial/left atrial appendage thrombus was detected. The LAA emptying velocity was 70 cm/s.  4. Small mobile vegetation on the mitral valve chordal structure to supporting the anterior mitral leaflet.  5. The mitral valve is abnormal. There is moderate thickning of the anterior  leaflet and chordae consistent with vegetation. Trivial mitral valve regurgitation.  6. Thickening, vegetation of the mitral valve anterior leaflet.  7. Valve leaflets appear thickened, possibly consistent with degeneration or vegetation. The aortic valve has been repaired/replaced. Aortic valve regurgitation is mild. Mild aortic valve stenosis. 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 Echo findings are consistent with vegetation of the aortic prosthesis. Aortic valve mean gradient measures 3.0 mmHg (off axis doppler).  8. Aortic dilatation noted. 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 There is mild (Grade II) atheroma plaque involving the ascending, transverse and descending aorta. 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 FINDINGS  Left Ventricle: Left ventricular ejection fraction, by estimation, is 60 to 65%. The left ventricle has normal function. The left ventricular internal cavity size was normal in size. There is mild left ventricular hypertrophy. Right Ventricle: The right ventricular size is normal. No increase in right ventricular wall thickness. Right ventricular systolic function is mildly reduced. Left Atrium: Left atrial size was normal in size. No left atrial/left atrial appendage thrombus was detected. The LAA emptying velocity was 70 cm/s. Right Atrium: Right atrial size was normal in size. Pericardium: There is no evidence of pericardial effusion. Mitral Valve: The mitral valve is abnormal. There is moderate thickening of the anterior mitral valve leaflet(s). A small vegetation is seen on the anterior mitral leaflet. The MV vegetation measures 5 mm x 5 mm. Thickening, vegetation involving the anterior leaflet is noted. Trivial mitral valve regurgitation. Tricuspid Valve: The tricuspid valve is grossly normal. Tricuspid valve regurgitation is trivial. Aortic Valve: Valve  leaflets appear thickened, possibly consistent with degeneration or vegetation. The aortic valve has been repaired/replaced. Aortic valve regurgitation is mild. Aortic regurgitation PHT measures 646 msec. Mild aortic stenosis is present. Aortic valve mean gradient measures 3.0 mmHg. Aortic valve peak gradient measures 5.3 mmHg. 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 Pulmonic Valve: The pulmonic valve was grossly normal. Pulmonic valve regurgitation is not visualized. Aorta: Aortic dilatation noted. 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 There is mild (Grade II) atheroma plaque involving the ascending, transverse and descending aorta. IAS/Shunts: No atrial level shunt detected by color flow Doppler.  AORTIC VALVE AV Vmax:      115.00 cm/s AV Vmean:     72.100 cm/s AV VTI:       0.159 m AV Peak Grad: 5.3 mmHg AV Mean Grad: 3.0 mmHg AI PHT:       646 msec Lyman Bishop MD Electronically signed by Lyman Bishop MD Signature Date/Time: 05/22/2021/2:41:39 PM    Final    [place] EKG SITE RITE  Result Date: 05/21/2021 If Site Rite image not attached, placement could not be confirmed due to current cardiac rhythm.   Cardiac Studies   Transesophageal echocardiogram 05/22/2021  1.  Left ventricular ejection fraction, by estimation, is 60 to 65%. The left ventricle has normal function. There is mild left ventricular hypertrophy.   2. Right ventricular systolic function is mildly reduced. The right ventricular size is normal.   3. No left atrial/left atrial appendage thrombus was detected. The LAA emptying velocity was 70 cm/s.   4. Small mobile vegetation on the mitral valve chordal structure to supporting the anterior mitral leaflet.   5. The mitral valve is abnormal. There is moderate thickning of the anterior leaflet and chordae consistent with vegetation. Trivial mitral valve regurgitation.   6. Thickening, vegetation of the mitral valve anterior leaflet.   7.  Valve leaflets appear thickened, possibly consistent with degeneration or vegetation. The aortic valve has been repaired/replaced. Aortic valve regurgitation is mild. Mild aortic valve stenosis. 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 Echo findings are consistent with vegetation of the aortic prosthesis. Aortic valve mean gradient measures 3.0 mmHg (off axis doppler).   8. Aortic dilatation noted. 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 There is mild (Grade II) atheroma plaque involving the ascending, transverse and descending aorta.   Patient Profile     75 y.o. 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 The blood cultures may remain negative probably because he was receiving antibiotics when they were drawn, but it is reasonable to assume that MSSA identified 04/12/2021 is the culprit. Not a candidate for third sternotomy. Plan extended IV antibiotics (6 weeks) and recommend lifelong suppressive oral antibiotic. Can use CRP to track progress.  ESR surprisingly low. SVT: It seems he is able to take oral medications, crushed in food.  We will switch metoprolol and amiodarone to p.o. 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 No hemodynamic indication for surgery. DM: recent A1c relatively high at 7.9%.  Surprisingly developed hypokalemia despite supplementation.  Not on diuretics.  Consider interstitial nephritis due to beta-lactam?     For questions or updates, please contact [place] Please consult [email or web address] for contact info under        Signed, Sanda Klein, MD  05/23/2021, 9:56 AM

## 2021-05-23 NOTE — Progress Notes (Signed)
PHARMACY CONSULT NOTE FOR:  OUTPATIENT  PARENTERAL ANTIBIOTIC THERAPY (OPAT)  Indication: PV Endocarditis Regimen:  Nafcillin 12g IV q24h as a continuous infusion Gentamicin 80 mg IV q8h x 2 weeks Rifampin 300 mg PO TID End date: Nafcillin and Rifampin stop 07/03/21 Gentamicin stop 06/05/21  IV antibiotic discharge orders are pended. To discharging provider:  please sign these orders via discharge navigator,  Select New Orders & click on the button choice - Manage This Unsigned Work.     Thank you for allowing pharmacy to be a part of this patient's care.  Lestine Box, PharmD PGY2 Infectious Diseases Pharmacy Resident   Please check [email or web address] for unit-specific pharmacy phone numbers

## 2021-05-23 NOTE — Progress Notes (Signed)
Needs to have                        PROGRESS NOTE        PATIENT DETAILS Name: Michael Hines Age: 75 y.o. Sex: male Date of Birth: 07-25-1946 Admit Date: 05/13/2021 Admitting Physician Kristopher Oppenheim, DO BLT:[id number], [name], MD  Brief Narrative: Patient is a 75 y.o. male with history of aortic valve replacement (bioprosthetic valve)-recent hospitalization (9/2-9/9) for MSSA bacteremia (TEE negative for endocarditis on 9/8)-completed IV Ancef x4 weeks.  Patient was referred to the ED from ID clinic on 10/3 for evaluation of encephalopathy/hypothermia.  Patient was thought to have SIRS-however blood cultures was negative-further hospital course was complicated by development of SVT requiring IV amiodarone and cardiology consultation--eventually patient underwent TEE on 10/12-which showed mitral valve vegetation.  See below for further details.  Subjective: In mittens-slightly more awake and alert but confusion spells.  Spouse at bedside.  Objective: Vitals: Blood pressure (!) 169/91, pulse 90, temperature 98.9 F (37.2 C), temperature source Oral, resp. rate 18, height 5\' 7"  (1.702 m), weight 84.8 kg, SpO2 99 %.   Exam: Gen Exam: Confused but not in any distress. HEENT:atraumatic, normocephalic Chest: B/L clear to auscultation anteriorly CVS:S1S2 regular Abdomen:soft non tender, non distended Extremities:no edema Neurology: Difficult exam but seems to be moving all 4 extremities. Skin: no rash   Pertinent Labs/Radiology: WBC: 9.7 Hb: 15.2  Na: 136 K: 3.0 Creatinine: 1.0  10/03: Blood culture: No growth 10/10>>Blood culture: No growth 10/12>> blood culture: No growth  10/07: EEG: No seizures. 10/07: CTA chest: No PE 10/08: MRI brain>> acute cortical infarct versus artifact. 10/08:>> CT angio head and neck: No high-grade stenosis. 10/12>> TEE: Endocarditis involving mitral valve, diffuse thickening of bioprosthetic aortic valve leaflets.   Assessment/Plan: Acute  metabolic encephalopathy: Due to endocarditis-possibly small septic emboli to brain-continues to have lingering encephalopathy-continue supportive care.    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  ID following-remains on nafcillin/rifampin and gentamicin.  SVT: Maintaining sinus rhythm-initially on IV amiodarone/metoprolol-this has been switched to oral route.  Appreciate cardiology input.  t.  History of aortic valve replacement with bioprosthetic valve  ?  Acute CVA (septic emboli) versus artifact: Reviewed neurology note-thought to be a artifact rather than a acute CVA-however study was limited due to motion artifact.  History of vascular parkinsonism: Prior MD-Dr. Dr. Tyrell Antonio- discussed with patient's primary neurologist-Sinemet on hold.  Oral thrush: On nystatin  Hypokalemia: Continue to replete and recheck.  Hyponatremia: Mild-stable to be followed closely  HTN: BP fluctuating but otherwise stable-continue amlodipine/metoprolol.  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  Failure to thrive syndrome/frailty: Long discussion with son over the phone and spouse at bedside this morning-patient has had significant decline in overall function/quality of life for the past 1-2 months.  Continues to have very poor oral intake-family struggling-apparently patient does have advanced directives in place-son is trying to reach patient's attorney to get a copy of the  advance directives.  Encouraged  family to continue to have discussion-patient's son is the surrogate decision maker-as patient continues to exhibit severe failure to thrive syndrome-frailty and very poor oral intake.  We discussed numerous options including pursuing short-term NG tube feedings, hospice care/end-of-life care etc.-family will continue discussion-I will reach out to the family again on 10/14.   Procedures: None Consults: None DVT Prophylaxis: Lovenox Code Status:Full code Family Communication: Spouse at bedside-son over the phone on 10/13.  Time spent: 35 minutes-Greater than 50% of this time was spent in counseling, explanation of diagnosis, planning of further management, and coordination of care.  Diet: Diet Order             Diet Heart Room service appropriate? Yes; Fluid consistency: Thin  Diet effective now                      Disposition Plan: Status is: Inpatient  Remains inpatient appropriate because:Inpatient level of care appropriate due to severity of illness  Dispo: The patient is from: SNF              Anticipated d/c is to: SNF              Patient currently is not medically stable to d/c.   Difficult to place patient No   Barriers to Discharge: Ongoing/lingering encephalopathy-mitral valve endocarditis-restarting IV antibiotics.  Antimicrobial agents: Anti-infectives (From admission, onward)    Start     Dose/Rate Route Frequency Ordered Stop   05/23/21 1145  nafcillin 12 g in sodium chloride 0.9 % 500 mL continuous infusion        12 g 20.8 mL/hr over 24 Hours Intravenous Every 24 hours 05/23/21 1045     05/22/21 1800  ceFAZolin (ANCEF) IVPB 2g/100 mL premix  Status:  Discontinued        2 g 200 mL/hr over 30 Minutes Intravenous Every 8 hours 05/22/21 1439 05/22/21 1447   05/22/21 1700  gentamicin (GARAMYCIN) IVPB 80 mg        80 mg 100 mL/hr over 30 Minutes Intravenous Every 8 hours 05/22/21 1507     05/22/21 1600  nafcillin 2 g in  sodium chloride 0.9 % 100 mL IVPB  Status:  Discontinued        2 g 200 mL/hr over 30 Minutes Intravenous Every 4 hours 05/22/21 1507 05/23/21 1045   05/22/21 1600  rifampin (RIFADIN) 300 mg in sodium chloride 0.9 % 100 mL IVPB        300 mg 200 mL/hr over 30 Minutes Intravenous Every 8 hours 05/22/21 1507     05/14/21 2000  vancomycin (VANCOREADY) IVPB 750 mg/150 mL  Status:  Discontinued        750 mg 150 mL/hr over 60 Minutes Intravenous Every 24 hours 05/13/21 1902 05/14/21 1011   05/14/21 1900  vancomycin (VANCOREADY) IVPB 1500 mg/300 mL  Status:  Discontinued        1,500 mg 150 mL/hr over 120 Minutes Intravenous Every 24 hours 05/14/21 1011 05/14/21 1418   05/14/21 1400  ceFEPIme (MAXIPIME) 2 g in sodium chloride 0.9 % 100 mL IVPB  Status:  Discontinued        2 g 200 mL/hr over 30 Minutes Intravenous Every 8 hours 05/14/21 1000 05/15/21 0947   05/14/21 0600  ceFEPIme (MAXIPIME) 2 g in sodium chloride 0.9 % 100 mL IVPB  Status:  Discontinued        2 g 200 mL/hr over 30 Minutes Intravenous Every 12 hours 05/14/21  0201 05/14/21 1000   05/14/21 0100  ceFEPIme (MAXIPIME) 2 g in sodium chloride 0.9 % 100 mL IVPB  Status:  Discontinued        2 g 200 mL/hr over 30 Minutes Intravenous Every 8 hours 05/13/21 1710 05/14/21 0200   05/13/21 1700  ceFEPIme (MAXIPIME) 2 g in sodium chloride 0.9 % 100 mL IVPB        2 g 200 mL/hr over 30 Minutes Intravenous  Once 05/13/21 1654 05/13/21 1903   05/13/21 1700  vancomycin (VANCOREADY) IVPB 1750 mg/350 mL        1,750 mg 175 mL/hr over 120 Minutes Intravenous  Once 05/13/21 1654 05/13/21 2103        MEDICATIONS: Scheduled Meds:  amiodarone  200 mg Oral Daily   amLODipine  5 mg Oral Daily   atorvastatin  40 mg Oral QHS   clotrimazole   Topical BID   enoxaparin (LOVENOX) injection  40 mg Subcutaneous Q24H   fluticasone furoate-vilanterol  1 puff Inhalation Daily   insulin aspart  0-15 Units Subcutaneous TID WC   insulin aspart  0-5 Units  Subcutaneous QHS   metoprolol tartrate  25 mg Oral BID   nystatin  5 mL Oral QID   potassium chloride  40 mEq Oral Q4H   sodium chloride flush  10-40 mL Intracatheter Q12H   Continuous Infusions:  dextrose 5 % and 0.9% NaCl 50 mL/hr at 05/22/21 0608   gentamicin 80 mg (05/23/21 0827)   nafcillin (NAFCIL) continuous infusion     rifampin (RIFADIN) IVPB 300 mg (05/23/21 0530)   PRN Meds:.acetaminophen **OR** acetaminophen, ipratropium-albuterol, LORazepam, ondansetron **OR** ondansetron (ZOFRAN) IV, sodium chloride flush   I have personally reviewed following labs and imaging studies  LABORATORY DATA: CBC: Recent Labs  Lab 05/19/21 0131 05/20/21 0949 05/21/21 0621 05/22/21 0346 05/23/21 0331  WBC 7.6 8.6 10.2 9.0 9.7  HGB 15.1 14.6 15.6 15.2 15.2  HCT 46.5 44.2 47.2 45.7 46.5  MCV 92.6 93.2 92.0 92.3 92.8  PLT 242 238 251 246 235     Basic Metabolic Panel: Recent Labs  Lab 05/17/21 0726 05/18/21 1008 05/19/21 0131 05/20/21 0949 05/21/21 0621 05/22/21 0346 05/23/21 0331  NA 130* 131* 132* 135 136 132* 136  K 4.0 3.3* 3.5 3.6 3.5 3.3* 3.0*  CL 97* 97* 97* 99 99 98 101  CO2 23 25 27 28 28 25 25   GLUCOSE 108* 112* 103* 109* 120* 110* 100*  BUN <5* <5* <5* <5* <5* <5* <5*  CREATININE 0.88 0.83 0.87 0.92 0.95 0.79 1.00  CALCIUM 8.4* 8.4* 8.7* 8.6* 8.6* 8.4* 8.4*  MG 1.8 1.5* 2.0  --  1.7  --  1.6*     GFR: Estimated Creatinine Clearance: 68.5 mL/min (by C-G formula based on SCr of 1 mg/dL).  Liver Function Tests: Recent Labs  Lab 05/23/21 0331  AST 17  ALT 10  ALKPHOS 102  BILITOT 2.2*  PROT 5.8*  ALBUMIN 2.5*   No results for input(s): LIPASE, AMYLASE in the last 168 hours. No results for input(s): AMMONIA in the last 168 hours.  Coagulation Profile: No results for input(s): INR, PROTIME in the last 168 hours.  Cardiac Enzymes: No results for input(s): CKTOTAL, CKMB, CKMBINDEX, TROPONINI in the last 168 hours.  BNP (last 3 results) No results  for input(s): PROBNP in the last 8760 hours.  Lipid Profile: No results for input(s): CHOL, HDL, LDLCALC, TRIG, CHOLHDL, LDLDIRECT in the last 72 hours.  Thyroid Function Tests: No  results for input(s): TSH, T4TOTAL, FREET4, T3FREE, THYROIDAB in the last 72 hours.  Anemia Panel: No results for input(s): VITAMINB12, FOLATE, FERRITIN, TIBC, IRON, RETICCTPCT in the last 72 hours.  Urine analysis:    Component Value Date/Time   COLORURINE YELLOW 05/13/2021 1654   APPEARANCEUR HAZY (A) 05/13/2021 1654   LABSPEC 1.018 05/13/2021 1654   PHURINE 6.0 05/13/2021 1654   GLUCOSEU NEGATIVE 05/13/2021 1654   HGBUR SMALL (A) 05/13/2021 1654   BILIRUBINUR NEGATIVE 05/13/2021 1654   KETONESUR 20 (A) 05/13/2021 1654   PROTEINUR 30 (A) 05/13/2021 1654   NITRITE NEGATIVE 05/13/2021 1654   LEUKOCYTESUR NEGATIVE 05/13/2021 1654    Sepsis Labs: Lactic Acid, Venous    Component Value Date/Time   LATICACIDVEN 1.4 05/13/2021 2025    MICROBIOLOGY: Recent Results (from the past 240 hour(s))  Blood Culture (routine x 2)     Status: None   Collection Time: 05/13/21  5:13 PM   Specimen: BLOOD  Result Value Ref Range Status   Specimen Description BLOOD LEFT ANTECUBITAL  Final   Special Requests   Final    BOTTLES DRAWN AEROBIC AND ANAEROBIC Blood Culture results may not be optimal due to an inadequate volume of blood received in culture bottles   Culture   Final    NO GROWTH 5 DAYS Performed at [facility], [place] [address], [place], [place] [address]    Report Status 05/18/2021 FINAL  Final  Resp Panel by RT-PCR (Flu A&B, Covid) Nasopharyngeal Swab     Status: None   Collection Time: 05/13/21  5:22 PM   Specimen: Nasopharyngeal Swab; Nasopharyngeal(NP) swabs in vial transport medium  Result Value Ref Range Status   SARS Coronavirus 2 by RT PCR NEGATIVE NEGATIVE Final    Comment: (NOTE) SARS-CoV-2 target nucleic acids are NOT DETECTED.  The SARS-CoV-2 RNA is generally detectable in upper  respiratory specimens during the acute phase of infection. The lowest concentration of SARS-CoV-2 viral copies this assay can detect is 138 copies/mL. A negative result does not preclude SARS-Cov-2 infection and should not be used as the sole basis for treatment or other patient management decisions. A negative result may occur with  improper specimen collection/handling, submission of specimen other than nasopharyngeal swab, presence of viral mutation(s) within the areas targeted by this assay, and inadequate number of viral copies(<138 copies/mL). A negative result must be combined with clinical observations, patient history, and epidemiological information. The expected result is Negative.  Fact Sheet for Patients:  EntrepreneurPulse.com.au  Fact Sheet for Healthcare Providers:  IncredibleEmployment.be  This test is no t yet approved or cleared by the [place] FDA and  has been authorized for detection and/or diagnosis of SARS-CoV-2 by FDA under an Emergency Use Authorization (EUA). This EUA will remain  in effect (meaning this test can be used) for the duration of the COVID-19 declaration under Section 564(b)(1) of the Act, 21 U.S.C.section 360bbb-3(b)(1), unless the authorization is terminated  or revoked sooner.       Influenza A by PCR NEGATIVE NEGATIVE Final   Influenza B by PCR NEGATIVE NEGATIVE Final    Comment: (NOTE) The Xpert Xpress SARS-CoV-2/FLU/RSV plus assay is intended as an aid in the diagnosis of influenza from Nasopharyngeal swab specimens and should not be used as a sole basis for treatment. Nasal washings and aspirates are unacceptable for Xpert Xpress SARS-CoV-2/FLU/RSV testing.  Fact Sheet for Patients: EntrepreneurPulse.com.au  Fact Sheet for Healthcare Providers: IncredibleEmployment.be  This test is not yet approved or cleared  by the [place] and has been  authorized for detection and/or diagnosis of SARS-CoV-2 by FDA under an Emergency Use Authorization (EUA). This EUA will remain in effect (meaning this test can be used) for the duration of the COVID-19 declaration under Section 564(b)(1) of the Act, 21 U.S.C. section 360bbb-3(b)(1), unless the authorization is terminated or revoked.  Performed at [facility], [place] [address], [place], [place] [address]   Blood Culture (routine x 2)     Status: None   Collection Time: 05/13/21  5:39 PM   Specimen: BLOOD  Result Value Ref Range Status   Specimen Description BLOOD RIGHT ANTECUBITAL  Final   Special Requests   Final    BOTTLES DRAWN AEROBIC AND ANAEROBIC Blood Culture results may not be optimal due to an inadequate volume of blood received in culture bottles   Culture   Final    NO GROWTH 5 DAYS Performed at [facility], [place] [address], [place], [place] [address]    Report Status 05/18/2021 FINAL  Final  Urine Culture     Status: None   Collection Time: 05/15/21  1:16 PM   Specimen: Urine, Clean Catch  Result Value Ref Range Status   Specimen Description URINE, CLEAN CATCH  Final   Special Requests NONE  Final   Culture   Final    NO GROWTH Performed at [facility], [place] [address], [place], [place] [address]    Report Status 05/16/2021 FINAL  Final  MRSA Next Gen by PCR, Nasal     Status: None   Collection Time: 05/15/21  3:54 PM   Specimen: Nasal Mucosa; Nasal Swab  Result Value Ref Range Status   MRSA by PCR Next Gen NOT DETECTED NOT DETECTED Final    Comment: (NOTE) The GeneXpert MRSA Assay (FDA approved for NASAL specimens only), is one component of a comprehensive MRSA colonization surveillance program. It is not intended to diagnose MRSA infection nor to guide or monitor treatment for MRSA infections. Test performance is not FDA approved in patients less than 67 years old. Performed at [facility], [place] [address] [place], [place] [address]    Culture, blood (routine x 2)     Status: None (Preliminary result)   Collection Time: 05/20/21 12:02 PM   Specimen: BLOOD  Result Value Ref Range Status   Specimen Description BLOOD RIGHT ANTECUBITAL  Final   Special Requests   Final    BOTTLES DRAWN AEROBIC AND ANAEROBIC Blood Culture adequate volume   Culture   Final    NO GROWTH 3 DAYS Performed at [facility], [place] [address] [place], [place] [address]    Report Status PENDING  Incomplete  Culture, blood (routine x 2)     Status: None (Preliminary result)   Collection Time: 05/20/21 12:02 PM   Specimen: BLOOD  Result Value Ref Range Status   Specimen Description BLOOD RIGHT ANTECUBITAL  Final   Special Requests   Final    BOTTLES DRAWN AEROBIC AND ANAEROBIC Blood Culture adequate volume   Culture   Final    NO GROWTH 3 DAYS Performed at [facility], [place] [address], [place],  [address]    Report Status PENDING  Incomplete  Culture, blood (routine x 2)     Status: None (Preliminary result)   Collection Time: 05/22/21  2:46 PM   Specimen: BLOOD  Result Value Ref Range Status   Specimen Description BLOOD RIGHT  ANTECUBITAL  Final   Special Requests   Final    BOTTLES DRAWN AEROBIC AND ANAEROBIC Blood Culture adequate volume   Culture   Final    NO GROWTH < 24 HOURS Performed at [facility], [address] [place], [place] [address]    Report Status PENDING  Incomplete  Culture, blood (routine x 2)     Status: None (Preliminary result)   Collection Time: 05/22/21  2:50 PM   Specimen: BLOOD RIGHT WRIST  Result Value Ref Range Status   Specimen Description BLOOD RIGHT WRIST  Final   Special Requests   Final    BOTTLES DRAWN AEROBIC AND ANAEROBIC Blood Culture adequate volume   Culture   Final    NO GROWTH < 24 HOURS Performed at [facility], [place] [address] [place], [place] [address]    Report Status PENDING  Incomplete    RADIOLOGY STUDIES/RESULTS: ECHO TEE  Result Date:  05/22/2021    TRANSESOPHOGEAL ECHO REPORT   Patient Name:   Michael Hines Date of Exam: 05/22/2021 Medical Rec #:  [id number]         Height:       67.0 in Accession #:    [id number]        Weight:       185.8 lb Date of Birth:  [birth year]         BSA:          1.960 m Patient Age:    22 years          BP:           144/72 mmHg Patient Gender: M                 HR:           77 bpm. Exam Location:  Inpatient Procedure: 3D Echo, Transesophageal Echo, Cardiac Doppler and Color Doppler Indications:     Bacteremia  History:         Patient has prior history of Echocardiogram examinations.                  Abnormal ECG, COPD, Aortic Valve Disease, Arrythmias:SVT,                  Signs/Symptoms:Hypotension; Risk Factors:Diabetes.                  Aortic Valve: 21 mm bioprosthetic valve is present in the                  aortic position.  Sonographer:     Roseanna Rainbow RDCS Referring Phys:  [id number] Darreld Mclean Diagnosing Phys: Lyman Bishop MD PROCEDURE: After discussion of the risks and benefits of a TEE, an informed consent was obtained from the patient. The transesophogeal probe was passed without difficulty through the esophogus of the patient. Imaged were obtained with the patient in a supine position. Sedation performed by different physician. The patient was monitored while under deep sedation. Anesthestetic sedation was provided intravenously by Anesthesiology: 183mg  of Propofol. The patient's vital signs; including heart rate, blood pressure, and oxygen saturation; remained stable throughout the procedure. The patient developed no complications during the procedure. IMPRESSIONS  1. Left ventricular ejection fraction, by estimation, is 60 to 65%. The left ventricle has normal function. There is mild left ventricular hypertrophy.  2. Right ventricular systolic function is mildly reduced. The right ventricular size is normal.  3. No left atrial/left atrial appendage  thrombus was detected. The LAA emptying  velocity was 70 cm/s.  4. Small mobile vegetation on the mitral valve chordal structure to supporting the anterior mitral leaflet.  5. The mitral valve is abnormal. There is moderate thickning of the anterior leaflet and chordae consistent with vegetation. Trivial mitral valve regurgitation.  6. Thickening, vegetation of the mitral valve anterior leaflet.  7. Valve leaflets appear thickened, possibly consistent with degeneration or vegetation. The aortic valve has been repaired/replaced. Aortic valve regurgitation is mild. Mild aortic valve stenosis. 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 Echo findings are consistent with vegetation of the aortic prosthesis. Aortic valve mean gradient measures 3.0 mmHg (off axis doppler).  8. Aortic dilatation noted. 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 There is mild (Grade II) atheroma plaque involving the ascending, transverse and descending aorta. 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 FINDINGS  Left Ventricle: Left ventricular ejection fraction, by estimation, is 60 to 65%. The left ventricle has normal function. The left ventricular internal cavity size was normal in size. There is mild left ventricular hypertrophy. Right Ventricle: The right ventricular size is normal. No increase in right ventricular wall thickness. Right ventricular systolic function is mildly reduced. Left Atrium: Left atrial size was normal in size. No left atrial/left atrial appendage thrombus was detected. The LAA emptying velocity was 70 cm/s. Right Atrium: Right atrial size was normal in size. Pericardium: There is no evidence of pericardial effusion. Mitral Valve: The mitral valve is abnormal. There is moderate thickening of the anterior mitral valve leaflet(s). A small vegetation is seen on the anterior mitral leaflet. The MV vegetation measures 5 mm x 5 mm. Thickening,  vegetation involving the anterior leaflet is noted. Trivial mitral valve regurgitation. Tricuspid Valve: The tricuspid valve is grossly normal. Tricuspid valve regurgitation is trivial. Aortic Valve: Valve leaflets appear thickened, possibly consistent with degeneration or vegetation. The aortic valve has been repaired/replaced. Aortic valve regurgitation is mild. Aortic regurgitation PHT measures 646 msec. Mild aortic stenosis is present. Aortic valve mean gradient measures 3.0 mmHg. Aortic valve peak gradient measures 5.3 mmHg. 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 Pulmonic Valve: The pulmonic valve was grossly normal. Pulmonic valve regurgitation is not visualized. Aorta: Aortic dilatation noted. 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 There is mild (Grade II) atheroma plaque involving the ascending, transverse and descending aorta. IAS/Shunts: No atrial level shunt detected by color flow Doppler.  AORTIC VALVE AV Vmax:      115.00 cm/s AV Vmean:     72.100 cm/s AV VTI:       0.159 m AV Peak Grad: 5.3 mmHg AV Mean Grad: 3.0 mmHg AI PHT:       646 msec Lyman Bishop MD Electronically signed by Lyman Bishop MD Signature Date/Time: 05/22/2021/2:41:39 PM    Final    [place] EKG SITE RITE  Result Date: 05/21/2021 If Site Rite image not attached, placement could not be confirmed due to current cardiac rhythm.    LOS: 10 days   Oren Binet, MD  [facility]    To contact the attending provider between 7A-7P or the covering provider during after hours 7P-7A, please log into the web site [email or web address] and access using universal [place] password for that web site. If you do not have the password, please call the hospital operator.  05/23/2021, 12:28 PM

## 2021-05-23 NOTE — Progress Notes (Addendum)
Telemetry and chart reviewed/discussed with Dr. Quentin Ore MV endocarditis by TEE yesterday  SR/SB no SVT's BP stable/HTN  Recommend when the patient is reliably taking PO meds/intake to transition amiodarone gtt to PO. Continue lopressor  EP will step back, though remain available, please recall if needed.  Tommye Standard, PA-C

## 2021-05-23 NOTE — Progress Notes (Signed)
[facility] Infectious Diseases Follow Up Note  Patient Identification: Patient Name: Michael Hines MRN: [id number] [place] Date: 05/13/2021  4:36 PM Age: 75 y.o.Today's Date: 05/23/2021   Reason for Visit: PV endocarditis   Principal Problem:   Sepsis with acute organ dysfunction ([place]) Active Problems:   Diabetes mellitus without complication ([place])   S/P AVR   Diabetic polyneuropathy associated with type 2 diabetes mellitus (HCC)   Chronic bilateral low back pain without sciatica   Hypotension   Hypothermia   AKI (acute kidney injury) ([place])   Sepsis with acute organ dysfunction without septic shock (HCC)   SVT (supraventricular tachycardia) (HCC)   Prosthetic valve endocarditis (HCC)   Cerebral septic emboli (HCC)  Antibiotics: None currently    Lines/hardwares: RT arm midline     Interval Events: afebrile, no leukocytosis, continues to be confused.. 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  - Will follow blood culture peripherally. Otherwise I will  sign off for now.  - f/u appt made at [place] on  11/3 at 8:45 with myself - Discussed with wife/ID pharmacy  Rest of the management as per the primary team. Thank you for the consult. Please page with pertinent questions or concerns.  ______________________________________________________________________ Subjective patient seen and examined at the bedside. Per wife, he just slept and requested not to wake him up   Vitals BP (!) 169/91 (BP Location: Left Leg)   Pulse 90   Temp 98.9 F (37.2 C) (Oral)   Resp 18   Ht 5\' 7"  (1.702 m)   Wt 84.8 kg   SpO2 99%   BMI 29.28 kg/m     Physical Exam He is lying in bed, sleeping with hands in mitten Spoke with wife at bedside    Pertinent Microbiology Results for orders placed or performed during the hospital encounter of 05/13/21  Blood Culture (routine x 2)     Status: None   Collection Time: 05/13/21  5:13 PM   Specimen: BLOOD  Result Value Ref Range Status   Specimen Description BLOOD LEFT ANTECUBITAL  Final   Special Requests   Final    BOTTLES DRAWN AEROBIC AND ANAEROBIC Blood Culture results may not be optimal due to an inadequate volume of blood received in culture bottles   Culture   Final    NO GROWTH 5 DAYS Performed at [facility], [place] [address] [place], [place] [address]    Report Status 05/18/2021 FINAL  Final  Resp Panel by RT-PCR (Flu A&B, Covid) Nasopharyngeal Swab     Status: None   Collection Time:  05/13/21  5:22 PM   Specimen: Nasopharyngeal Swab; Nasopharyngeal(NP) swabs in vial transport medium  Result Value Ref Range Status   SARS Coronavirus 2 by RT PCR NEGATIVE NEGATIVE Final    Comment: (NOTE) SARS-CoV-2 target nucleic acids are NOT DETECTED.  The SARS-CoV-2 RNA is generally detectable in upper respiratory specimens during the acute phase of infection. The lowest concentration of SARS-CoV-2 viral copies this assay can detect is 138 copies/mL. A negative result does not preclude  SARS-Cov-2 infection and should not be used as the sole basis for treatment or other patient management decisions. A negative result may occur with  improper specimen collection/handling, submission of specimen other than nasopharyngeal swab, presence of viral mutation(s) within the areas targeted by this assay, and inadequate number of viral copies(<138 copies/mL). A negative result must be combined with clinical observations, patient history, and epidemiological information. The expected result is Negative.  Fact Sheet for Patients:  EntrepreneurPulse.com.au  Fact Sheet for Healthcare Providers:  IncredibleEmployment.be  This test is no t yet approved or cleared by the [place] FDA and  has been authorized for detection and/or diagnosis of SARS-CoV-2 by FDA under an Emergency Use Authorization (EUA). This EUA will remain  in effect (meaning this test can be used) for the duration of the COVID-19 declaration under Section 564(b)(1) of the Act, 21 U.S.C.section 360bbb-3(b)(1), unless the authorization is terminated  or revoked sooner.       Influenza A by PCR NEGATIVE NEGATIVE Final   Influenza B by PCR NEGATIVE NEGATIVE Final    Comment: (NOTE) The Xpert Xpress SARS-CoV-2/FLU/RSV plus assay is intended as an aid in the diagnosis of influenza from Nasopharyngeal swab specimens and should not be used as a sole basis for treatment. Nasal washings and aspirates are unacceptable for Xpert Xpress SARS-CoV-2/FLU/RSV testing.  Fact Sheet for Patients: EntrepreneurPulse.com.au  Fact Sheet for Healthcare Providers: IncredibleEmployment.be  This test is not yet approved or cleared by the [place] FDA and has been authorized for detection and/or diagnosis of SARS-CoV-2 by FDA under an Emergency Use Authorization (EUA). This EUA will remain in effect (meaning this test can be used) for the duration of  the COVID-19 declaration under Section 564(b)(1) of the Act, 21 U.S.C. section 360bbb-3(b)(1), unless the authorization is terminated or revoked.  Performed at [facility], [place] [address] [place], [place] [address]   Blood Culture (routine x 2)     Status: None   Collection Time: 05/13/21  5:39 PM   Specimen: BLOOD  Result Value Ref Range Status   Specimen Description BLOOD RIGHT ANTECUBITAL  Final   Special Requests   Final    BOTTLES DRAWN AEROBIC AND ANAEROBIC Blood Culture results may not be optimal due to an inadequate volume of blood received in culture bottles   Culture   Final    NO GROWTH 5 DAYS Performed at [facility], [place] [address] [place], [place] [address]    Report Status 05/18/2021 FINAL  Final  Urine Culture     Status: None   Collection Time: 05/15/21  1:16 PM   Specimen: Urine, Clean Catch  Result Value Ref Range Status   Specimen Description URINE, CLEAN CATCH  Final   Special Requests NONE  Final   Culture   Final    NO GROWTH Performed at [facility], [place] [address] [place], [place] [address]    Report Status 05/16/2021 FINAL  Final  MRSA Next Gen by PCR, Nasal  Status: None   Collection Time: 05/15/21  3:54 PM   Specimen: Nasal Mucosa; Nasal Swab  Result Value Ref Range Status   MRSA by PCR Next Gen NOT DETECTED NOT DETECTED Final    Comment: (NOTE) The GeneXpert MRSA Assay (FDA approved for NASAL specimens only), is one component of a comprehensive MRSA colonization surveillance program. It is not intended to diagnose MRSA infection nor to guide or monitor treatment for MRSA infections. Test performance is not FDA approved in patients less than 68 years old. Performed at [facility], [place] [address] [place], [place] [address]   Culture, blood (routine x 2)     Status: None (Preliminary result)   Collection Time: 05/20/21 12:02 PM   Specimen: BLOOD  Result Value Ref Range Status   Specimen Description BLOOD  RIGHT ANTECUBITAL  Final   Special Requests   Final    BOTTLES DRAWN AEROBIC AND ANAEROBIC Blood Culture adequate volume   Culture   Final    NO GROWTH 3 DAYS Performed at [facility], [place] [address], [place], [place] [address]    Report Status PENDING  Incomplete  Culture, blood (routine x 2)     Status: None (Preliminary result)   Collection Time: 05/20/21 12:02 PM   Specimen: BLOOD  Result Value Ref Range Status   Specimen Description BLOOD RIGHT ANTECUBITAL  Final   Special Requests   Final    BOTTLES DRAWN AEROBIC AND ANAEROBIC Blood Culture adequate volume   Culture   Final    NO GROWTH 3 DAYS Performed at [facility], [place] [address] [place], [place] [address]    Report Status PENDING  Incomplete  Culture, blood (routine x 2)     Status: None (Preliminary result)   Collection Time: 05/22/21  2:46 PM   Specimen: BLOOD  Result Value Ref Range Status   Specimen Description BLOOD RIGHT ANTECUBITAL  Final   Special Requests   Final    BOTTLES DRAWN AEROBIC AND ANAEROBIC Blood Culture adequate volume   Culture   Final    NO GROWTH < 24 HOURS Performed at [facility], [place] [address] [place], [place] [address]    Report Status PENDING  Incomplete  Culture, blood (routine x 2)     Status: None (Preliminary result)   Collection Time: 05/22/21  2:50 PM   Specimen: BLOOD RIGHT WRIST  Result Value Ref Range Status   Specimen Description BLOOD RIGHT WRIST  Final   Special Requests   Final    BOTTLES DRAWN AEROBIC AND ANAEROBIC Blood Culture adequate volume   Culture   Final    NO GROWTH < 24 HOURS Performed at [facility], [place] [address] [place], [place] [address]    Report Status PENDING  Incomplete    Pertinent Lab. CBC Latest Ref Rng & Units 05/23/2021 05/22/2021 05/21/2021  WBC 4.0 - 10.5 K/uL 9.7 9.0 10.2  Hemoglobin 13.0 - 17.0 g/dL 15.2 15.2 15.6  Hematocrit 39.0 - 52.0 % 46.5 45.7 47.2  Platelets 150 - 400 K/uL 235 246 251   CMP  Latest Ref Rng & Units 05/23/2021 05/22/2021 05/21/2021  Glucose 70 - 99 mg/dL 100(H) 110(H) 120(H)  BUN 8 - 23 mg/dL <5(L) <5(L) <5(L)  Creatinine 0.61 - 1.24 mg/dL 1.00 0.79 0.95  Sodium 135 - 145 mmol/L 136 132(L) 136  Potassium 3.5 - 5.1 mmol/L 3.0(L) 3.3(L) 3.5  Chloride 98 - 111 mmol/L 101 98 99  CO2 22 - 32 mmol/L 25 25 28   Calcium 8.9 - 10.3 mg/dL 8.4(L) 8.4(L) 8.6(L)  Total Protein 6.5 - 8.1 g/dL 5.8(L) - -  Total Bilirubin 0.3 - 1.2 mg/dL 2.2(H) - -  Alkaline Phos 38 - 126 U/L 102 - -  AST 15 - 41 U/L 17 - -  ALT 0 - 44 U/L 10 - -     Pertinent Imaging today Plain films and CT images have been personally visualized and interpreted; radiology reports have been reviewed. Decision making incorporated into the Impression / Recommendations.  I spent more than 35 minutes for this patient encounter including review of prior medical records, coordination of care  with greater than 50% of time being face to face/counseling and discussing diagnostics/treatment plan with the patient/family.  Electronically signed by:   Rosiland Oz, MD Infectious Disease Physician [facility] Infect[facility] Pager: [phone]

## 2021-05-24 DIAGNOSIS — N179 Acute kidney failure, unspecified: Secondary | ICD-10-CM | POA: Diagnosis not present

## 2021-05-24 DIAGNOSIS — E1142 Type 2 diabetes mellitus with diabetic polyneuropathy: Secondary | ICD-10-CM | POA: Diagnosis not present

## 2021-05-24 DIAGNOSIS — I471 Supraventricular tachycardia: Secondary | ICD-10-CM | POA: Diagnosis not present

## 2021-05-24 DIAGNOSIS — R6520 Severe sepsis without septic shock: Secondary | ICD-10-CM | POA: Diagnosis not present

## 2021-05-24 DIAGNOSIS — Z952 Presence of prosthetic heart valve: Secondary | ICD-10-CM | POA: Diagnosis not present

## 2021-05-24 DIAGNOSIS — A419 Sepsis, unspecified organism: Secondary | ICD-10-CM | POA: Diagnosis not present

## 2021-05-24 DIAGNOSIS — I330 Acute and subacute infective endocarditis: Secondary | ICD-10-CM | POA: Diagnosis not present

## 2021-05-24 LAB — BASIC METABOLIC PANEL
Anion gap: 11 (ref 5–15)
BUN: 7 mg/dL — ABNORMAL LOW (ref 8–23)
CO2: 21 mmol/L — ABNORMAL LOW (ref 22–32)
Calcium: 8.1 mg/dL — ABNORMAL LOW (ref 8.9–10.3)
Chloride: 104 mmol/L (ref 98–111)
Creatinine, Ser: 1.37 mg/dL — ABNORMAL HIGH (ref 0.61–1.24)
GFR, Estimated: 54 mL/min — ABNORMAL LOW (ref >60)
Glucose, Bld: 115 mg/dL — ABNORMAL HIGH (ref 70–99)
Potassium: 3.2 mmol/L — ABNORMAL LOW (ref 3.5–5.1)
Sodium: 136 mmol/L (ref 135–145)

## 2021-05-24 LAB — GLUCOSE, CAPILLARY
Glucose-Capillary: 131 mg/dL — ABNORMAL HIGH (ref 70–99)
Glucose-Capillary: 118 mg/dL — ABNORMAL HIGH (ref 70–99)
Glucose-Capillary: 135 mg/dL — ABNORMAL HIGH (ref 70–99)
Glucose-Capillary: 119 mg/dL — ABNORMAL HIGH (ref 70–99)

## 2021-05-24 LAB — GENTAMICIN LEVEL, PEAK: Gentamicin Pk: 9.8 ug/mL (ref 5.0–10.0)

## 2021-05-24 LAB — MAGNESIUM: Magnesium: 2.3 mg/dL (ref 1.7–2.4)

## 2021-05-24 LAB — GENTAMICIN LEVEL, RANDOM: Gentamicin Rm: 2.3 ug/mL

## 2021-05-24 LAB — GENTAMICIN LEVEL, TROUGH: Gentamicin Trough: 5 ug/mL (ref 0.5–2.0)

## 2021-05-24 MED ORDER — PROCHLORPERAZINE EDISYLATE 10 MG/2ML IJ SOLN
5.0000 mg | Freq: Once | INTRAMUSCULAR | Status: AC | PRN
  Administered 2021-05-24: 5 mg via INTRAVENOUS
  Filled 2021-05-24: qty 2

## 2021-05-24 MED ORDER — LACTATED RINGERS IV SOLN: INTRAVENOUS | Status: DC

## 2021-05-24 MED ORDER — POTASSIUM CHLORIDE CRYS ER 20 MEQ PO TBCR
40.0000 meq | EXTENDED_RELEASE_TABLET | Freq: Once | ORAL | Status: AC
  Administered 2021-05-24: 40 meq via ORAL
  Filled 2021-05-24: qty 2

## 2021-05-24 NOTE — Progress Notes (Signed)
Pharmacy Antibiotic Note  Michael Hines is a 75 y.o. male admitted on 05/13/2021 with sepsis.    Pt recently complete 4 wks of abx for MSSA bacteremia. TEE on 10/12 now shows endocarditis. ID has ordered a combination of nafcillin/gent/rifampin.   The patient's Gentamicin level this evening remains elevated at 2.3. Estimated ke 0.112 (t 1/2 life ~6h) from earlier levels today (9.8 and 5). From the levels in the latter half of the day (levels 5 and 2.3) the ke is more prolonged at 0.065 (t1/2 life ~10h). Would estimate that the level will be at ~1 mcg/ml in ~12h however expect this will be prolonged if renal function continues to worsen.   Will plan to recheck levels around 12 noon tomorrow however if renal function continues to worsen - would not recommend re-initiation of Gentamicin as it is a known nephrotoxin.   Plan:  - Continue to hold Gentamicin - Recheck a Gent random + SCr at 12 noon tomorrow - If renal function worsens - would recommend against re-initiation or continuation of Gentamicin - Continue Nafcillin 12g IV q24 continous infusion - Continue Rifampin 300mg  PO q8  - Will continue to follow renal function, culture results, LOT, and antibiotic de-escalation plans   Height: 5\' 7"  (170.2 cm) Weight: 83.8 kg (184 lb 11.9 oz) IBW/kg (Calculated) : 66.1  Temp (24hrs), Avg:98.6 F (37 C), Min:98.1 F (36.7 C), Max:98.9 F (37.2 C)  Recent Labs  Lab 05/19/21 0131 05/20/21 0949 05/21/21 0621 05/22/21 0346 05/23/21 0331 05/24/21 0227 05/24/21 0824 05/24/21 2006  WBC 7.6 8.6 10.2 9.0 9.7  --   --   --   CREATININE 0.87 0.92 0.95 0.79 1.00 1.37*  --   --   GENTTROUGH  --   --   --   --   --   --  5.0*  --   GENTPEAK  --   --   --   --   --  9.8  --   --   GENTRANDOM  --   --   --   --   --   --   --  2.3     Estimated Creatinine Clearance: 49.7 mL/min (A) (by C-G formula based on SCr of 1.37 mg/dL (H)).    No Known Allergies  Antimicrobials this  admission: Vancomycin 10/3 x1 Cefepime 10/3 > 10/5 Nafcillin 10/12>>11/23 Rifampin 10/12>>11/23 Gent 10/12>>10/26  Microbiology results: 9/2 blood>>MSSA 10/3 BCx x2: ng final 10/5 urine>>neg 10/10 blood>>ngtd 10/12 blood>>ngtd  Thank you for allowing pharmacy to be a part of this patient's care.  Alycia Rossetti, PharmD, BCPS Clinical Pharmacist Clinical phone for 05/24/2021: [phone] 05/24/2021 9:27 PM   **Pharmacist phone directory can now be found on [email or web address] ([email or web address]).  Listed under [place].

## 2021-05-24 NOTE — Progress Notes (Addendum)
Diagnosis: PV endocarditis   Culture Result: MSSA   No Known Allergies  OPAT Orders Discharge antibiotics to be given via PICC line Discharge antibiotics: nafcillin and rifampin for 6 weeks and gentamicin for 2 weeks  Per pharmacy protocol  Aim for Vancomycin trough 15-20 or AUC 400-550 (unless otherwise indicated) Duration: 6 weeks  End Date: 07/03/21  [facility] Care Per Protocol:  Home health RN for IV administration and teaching; PICC line care and labs.    Labs weekly while on IV antibiotics: _X_ CBC with differential __ BMP X__ CMP __ CRP __ ESR __ Vancomycin trough __ CK X__Gentamicin trough   __ Please pull PIC at completion of IV antibiotics X_ Please leave PIC in place until doctor has seen patient or been notified  Fax weekly labs to [phone]  Clinic Follow Up Appt: Rosiland Oz   11/3 '@8' :68 am   Rosiland Oz, MD Infectious Disease Physician [facility] [address] [place], [place] [address] Phone: [phone]  Fax: [phone]'

## 2021-05-24 NOTE — Progress Notes (Signed)
Pharmacy Antibiotic Note  Michael Hines is a 75 y.o. male admitted on 05/13/2021 with sepsis.    Pt recently complete 4 wks of abx for MSSA bacteremia. TEE on 10/12 now shows endocarditis. ID has ordered a combination of nafcillin/gent/rifampin.   Gent peak 9.8 (goal 3-4), gent trough 5 (goal<1). Levels were drawn appropriately. Repeat random level tonight and we will restart if <1. Likely will need around 80mg  q12-24  Plan:  Nafcillin 12g IV q24 continous infusion Rifampin 300mg  PO q8  Hold gent Check gent level  Height: 5\' 7"  (170.2 cm) Weight: 83.8 kg (184 lb 11.9 oz) IBW/kg (Calculated) : 66.1  Temp (24hrs), Avg:98.6 F (37 C), Min:97.6 F (36.4 C), Max:98.9 F (37.2 C)  Recent Labs  Lab 05/19/21 0131 05/20/21 0949 05/21/21 0621 05/22/21 0346 05/23/21 0331 05/24/21 0227 05/24/21 0824  WBC 7.6 8.6 10.2 9.0 9.7  --   --   CREATININE 0.87 0.92 0.95 0.79 1.00 1.37*  --   GENTTROUGH  --   --   --   --   --   --  5.0*  GENTPEAK  --   --   --   --   --  9.8  --      Estimated Creatinine Clearance: 49.7 mL/min (A) (by C-G formula based on SCr of 1.37 mg/dL (H)).    No Known Allergies  Antimicrobials this admission: Vancomycin 10/3 x1 Cefepime 10/3 > 10/5 Nafcillin 10/12>>11/23 Rifampin 10/12>>11/23 Gent 10/12>>10/26  Microbiology results: 9/2 blood>>MSSA 10/3 BCx x2: ng final 10/5 urine>>neg 10/10 blood>>ngtd 10/12 blood>>ngtd  Onnie Boer, PharmD, [place], AAHIVP, CPP Infectious Disease Pharmacist 05/24/2021 11:01 AM

## 2021-05-24 NOTE — Progress Notes (Signed)
   Palliative Medicine Inpatient Follow Up Note     Chart Reviewed. Patient assessed at the bedside.   [name] is resting comfortably. No acute distress noted.  Bilateral mittens in place.  Son, Remo Lipps is asleep next to patient in recliner.  Discussed with Remo Lipps and his brother Ronalee Belts concerns for patient's ability to show any meaningful recovery/improvement.  Education provided on overall poor prognosis.  Son is tearful expressing heart rate watch and their father in current state.  We discussed at length patient's continued poor nutrition.  Education provided on temporary feeding versus long-term nutrition.  Encourage family to consider patient's quality of life.  Shared concerns that temporary feedings would not change overall prognosis and/or conditions but only prolong end-of-life.  Also with concerns of patient becoming agitated and attempting to remove.  Family verbalized understanding and appreciation of discussions and updates.  Appropriate questions asked.  Education provided on comfort focused care while hospitalized in addition to outpatient hospice support in the home versus residential hospice facility.  Family verbalized understanding expressing they most likely will consider residential hospice and comfort. Request additional time for ongoing private family discussions. Son aware I will follow-up tomorrow as requested for continued discussions and support in complex decisions.   Discussed the importance of continued conversation with family and their  medical providers regarding overall plan of care and treatment options, ensuring decisions are within the context of the patients values and GOCs.  Questions addressed and support provided.    Objective Assessment: Vital Signs Vitals:   05/24/21 0832 05/24/21 1140  BP:  107/70  Pulse:  82  Resp:  18  Temp:  98.9 F (37.2 C)  SpO2: 96% 100%    Intake/Output Summary (Last 24 hours) at 05/24/2021 1440 Last data filed at  05/24/2021 4917 Gross per 24 hour  Intake 419.89 ml  Output 625 ml  Net -205.11 ml   Last Weight  Most recent update: 05/24/2021  7:23 AM    Weight  83.8 kg (184 lb 11.9 oz)            Gen:  Resting, easily aroused, NAD, mittens in place CV: Regular rate and rhythm, no murmurs rubs or gallops PULM: clear to auscultation bilaterally. No wheezes/rales/rhonchi ABD: soft/nontender/nondistended/normal bowel sounds EXT: No edema Neuro: Alert to self and family, confusion noted  SUMMARY OF RECOMMENDATIONS   Continue with current plan of care per medical team  Extensive discussion with family regarding poor prognosis, malnutrition, and poor QOL. Family is leaning towards comfort and hospice home however would like additional time to discuss as a family. Plans to follow-up tomorrow for ongoing support and assistance with complex decision making.  PMT will continue to support and follow. Please secure chat for urgent needs.   Discussed with Dr. Sloan Leiter via secure chat.   Time Total: 45 min.   Visit consisted of counseling and education dealing with the complex and emotionally intense issues of symptom management and palliative care in the setting of serious and potentially life-threatening illness.Greater than 50%  of this time was spent counseling and coordinating care related to the above assessment and plan.  Alda Lea, AGPCNP-BC  Palliative Medicine Team [phone]  Palliative Medicine Team providers are available by phone from 7am to 7pm daily and can be reached through the team cell phone. Should this patient require assistance outside of these hours, please call the patient's attending physician.

## 2021-05-24 NOTE — Progress Notes (Signed)
Sleeping peacefully.  No new arrhythmic events on monitor.  Remains disoriented to her mentally, but taking p.o. meds.  [facility] will sign off.   Medication Recommendations: Amiodarone 200 mg p.o. daily.  Metoprolol tartrate 25 mg twice daily.  Amlodipine 5 mg daily Other recommendations (labs, testing, etc):  Had normal liver function tests (except for hypoalbuminemia) on 05/23/2021 and 05/15/2021.  Should be repeated at least every 6 months while on amiodarone, but hopefully this is a short-term medication for him. Follow up as an outpatient: Follow-up in roughly a month, after completion of antibiotic therapy.

## 2021-05-24 NOTE — Progress Notes (Addendum)
Needs to have                        PROGRESS NOTE        PATIENT DETAILS Name: Michael Hines Age: 75 y.o. Sex: male Date of Birth: 27-Oct-1945 Admit Date: 05/13/2021 Admitting Physician Kristopher Oppenheim, DO LEX:NTZGYF, [name], MD  Brief Narrative: Patient is a 75 y.o. male with history of aortic valve replacement (bioprosthetic valve)-recent hospitalization (9/2-9/9) for MSSA bacteremia (TEE negative for endocarditis on 9/8)-completed IV Ancef x4 weeks.  Patient was referred to the ED from ID clinic on 10/3 for evaluation of encephalopathy/hypothermia.  Patient was thought to have SIRS-however blood cultures was negative-further hospital course was complicated by development of SVT requiring IV amiodarone and cardiology consultation--eventually patient underwent TEE on 10/12-which showed mitral valve vegetation.  See below for further details.  Subjective: Sleeping comfortably-no major issues overnight-somewhat pleasantly confused.  Son sleeping at bedside as well.  Objective: Vitals: Blood pressure 107/70, pulse 82, temperature 98.9 F (37.2 C), temperature source Axillary, resp. rate 18, height 5\' 7"  (1.702 m), weight 83.8 kg, SpO2 100 %.   Exam: Gen Exam: Awake-mildly confused-follow simple commands.  Still with mittens. HEENT:atraumatic, normocephalic Chest: B/L clear to auscultation anteriorly CVS:S1S2 regular Abdomen:soft non tender, non distended Extremities:no edema Neurology: Generalized weakness. Skin: no rash   Pertinent Labs/Radiology: Na: 136 K: 3.2 Creatinine: 1.37  10/03: Blood culture: No growth 10/10>>Blood culture: No growth 10/12>> blood culture: No growth  10/07: EEG: No seizures. 10/07: CTA chest: No PE 10/08: MRI brain>> acute cortical infarct versus artifact. 10/08:>> CT angio head and neck: No high-grade stenosis. 10/12>> TEE: Endocarditis involving mitral valve, diffuse thickening of bioprosthetic aortic valve  leaflets.   Assessment/Plan: Acute metabolic encephalopathy: Due to endocarditis-possibly small septic emboli to brain-continues to have lingering encephalopathy-continue supportive care.    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  ID following-remains on nafcillin/rifampin and gentamicin.  SVT: Maintaining sinus rhythm-initially on IV amiodarone/metoprolol-this has been switched to oral route.  Appreciate cardiology input.  History of aortic valve replacement with bioprosthetic valve  ?  Acute CVA (septic emboli) versus artifact: Reviewed neurology note-thought to be a artifact rather than a acute CVA-however study was limited due to motion artifact.  History of vascular parkinsonism: Prior MD-Dr. Dr. Tyrell Antonio- discussed with patient's primary neurologist-Sinemet on hold.  Oral thrush: On nystatin  AKI: Due to poor oral intake-start gentle hydration with IVF.  Goals of care discussion and progress-see below.  Hypokalemia: Continue to replete and recheck.  Hyponatremia: Mild-stable to be followed closely  HTN: BP fluctuating but otherwise stable-continue amlodipine/metoprolol.  HLD: Continue statin  DM-2 (A1c 7.9 on 9/2): CBG stable-continue SSI  Recent Labs    05/23/21 2043 05/24/21 0754 05/24/21 1139  GLUCAP 130* 131* 118*      COPD: Not in exacerbation-continue bronchodilators  Ascending thoracic aneurysm 4.8 cm: Radiology recommending semiannual imaging by CTA or MRA.  Failure to thrive syndrome/frailty/palliative care/goals of care: Long discussion with son over the phone and spouse at bedside on 10/13-patient has had significant decline in overall function/quality of life for the past 1-2 months.   Per RN-patient continues to have very poor oral intake and now has started to develop AKI  Had extensive discussion with spouse/son  on 10/13-regarding overall prognosis-different treatment plans (including hospice/palliative care  vs placing NG tube for feedings).  Family apparently attempting to get advanced directives from family's attorney.  Reached out to spouse today-family continues to discuss-she wants to hold off on placing cortak tube for now-we will reach out to family again on 10/15.  Procedures: None Consults: None DVT Prophylaxis: Lovenox Code Status:Full code Family Communication: [phone] on 10/14.    Time spent: 35 minutes-Greater than 50% of this time was spent in counseling, explanation of diagnosis, planning of further management, and coordination of care.  Diet: Diet Order             Diet Heart Room service appropriate? Yes; Fluid consistency: Thin  Diet effective now                      Disposition Plan: Status is: Inpatient  Remains inpatient appropriate because:Inpatient level of care appropriate due to severity of illness  Dispo: The patient is from: SNF              Anticipated d/c is to: SNF              Patient currently is not medically stable to d/c.   Difficult to place patient No   Barriers to Discharge: Ongoing/lingering encephalopathy-mitral valve endocarditis-restarting IV antibiotics.  Antimicrobial agents: Anti-infectives (From admission, onward)    Start     Dose/Rate Route Frequency Ordered Stop   05/23/21 1430  rifampin (RIFADIN) capsule 300 mg        300 mg Oral Every 8 hours 05/23/21 1332     05/23/21 1145  nafcillin 12 g in sodium chloride 0.9 % 500 mL continuous infusion        12 g 20.8 mL/hr over 24 Hours Intravenous Every 24 hours 05/23/21 1045     05/22/21 1800  ceFAZolin (ANCEF) IVPB 2g/100 mL premix  Status:  Discontinued        2 g 200 mL/hr over 30 Minutes Intravenous Every 8 hours 05/22/21 1439  05/22/21 1447   05/22/21 1700  gentamicin (GARAMYCIN) IVPB 80 mg  Status:  Discontinued        80 mg 100 mL/hr over 30 Minutes Intravenous Every 8 hours 05/22/21 1507 05/24/21 0949   05/22/21 1600  nafcillin 2 g in sodium chloride 0.9 % 100 mL IVPB  Status:  Discontinued        2 g 200 mL/hr over 30 Minutes Intravenous Every 4 hours 05/22/21 1507 05/23/21 1045   05/22/21 1600  rifampin (RIFADIN) 300 mg in sodium chloride 0.9 % 100 mL IVPB  Status:  Discontinued        300 mg 200 mL/hr over 30 Minutes Intravenous Every 8 hours 05/22/21 1507 05/23/21 1334   05/14/21 2000  vancomycin (VANCOREADY) IVPB 750 mg/150 mL  Status:  Discontinued        750 mg 150 mL/hr over 60 Minutes Intravenous Every 24 hours 05/13/21 1902 05/14/21 1011   05/14/21 1900  vancomycin (VANCOREADY) IVPB 1500 mg/300 mL  Status:  Discontinued        1,500 mg 150 mL/hr over 120 Minutes Intravenous Every 24 hours 05/14/21 1011 05/14/21 1418   05/14/21 1400  ceFEPIme (MAXIPIME) 2 g in sodium chloride 0.9 % 100 mL IVPB  Status:  Discontinued        2 g 200 mL/hr over 30 Minutes Intravenous Every 8 hours 05/14/21 1000 05/15/21 0947   05/14/21 0600  ceFEPIme (MAXIPIME) 2  g in sodium chloride 0.9 % 100 mL IVPB  Status:  Discontinued        2 g 200 mL/hr over 30 Minutes Intravenous Every 12 hours 05/14/21 0201 05/14/21 1000   05/14/21 0100  ceFEPIme (MAXIPIME) 2 g in sodium chloride 0.9 % 100 mL IVPB  Status:  Discontinued        2 g 200 mL/hr over 30 Minutes Intravenous Every 8 hours 05/13/21 1710 05/14/21 0200   05/13/21 1700  ceFEPIme (MAXIPIME) 2 g in sodium chloride 0.9 % 100 mL IVPB        2 g 200 mL/hr over 30 Minutes Intravenous  Once 05/13/21 1654 05/13/21 1903   05/13/21 1700  vancomycin (VANCOREADY) IVPB 1750 mg/350 mL        1,750 mg 175 mL/hr over 120 Minutes Intravenous  Once 05/13/21 1654 05/13/21 2103        MEDICATIONS: Scheduled Meds:  amiodarone  200 mg Oral Daily   amLODipine  5 mg Oral Daily    atorvastatin  40 mg Oral QHS   clotrimazole   Topical BID   enoxaparin (LOVENOX) injection  40 mg Subcutaneous Q24H   fluticasone furoate-vilanterol  1 puff Inhalation Daily   insulin aspart  0-15 Units Subcutaneous TID WC   insulin aspart  0-5 Units Subcutaneous QHS   metoprolol tartrate  25 mg Oral BID   nystatin  5 mL Oral QID   rifampin  300 mg Oral Q8H   sodium chloride flush  10-40 mL Intracatheter Q12H   Continuous Infusions:  dextrose 5 % and 0.9% NaCl 50 mL/hr at 05/24/21 0107   nafcillin (NAFCIL) continuous infusion 12 g (05/24/21 1159)   PRN Meds:.acetaminophen **OR** acetaminophen, ipratropium-albuterol, melatonin, ondansetron **OR** ondansetron (ZOFRAN) IV, sodium chloride flush   I have personally reviewed following labs and imaging studies  LABORATORY DATA: CBC: Recent Labs  Lab 05/19/21 0131 05/20/21 0949 05/21/21 0621 05/22/21 0346 05/23/21 0331  WBC 7.6 8.6 10.2 9.0 9.7  HGB 15.1 14.6 15.6 15.2 15.2  HCT 46.5 44.2 47.2 45.7 46.5  MCV 92.6 93.2 92.0 92.3 92.8  PLT 242 238 251 246 235     Basic Metabolic Panel: Recent Labs  Lab 05/18/21 1008 05/19/21 0131 05/20/21 0949 05/21/21 0621 05/22/21 0346 05/23/21 0331 05/24/21 0227  NA 131* 132* 135 136 132* 136 136  K 3.3* 3.5 3.6 3.5 3.3* 3.0* 3.2*  CL 97* 97* 99 99 98 101 104  CO2 25 27 28 28 25 25  21*  GLUCOSE 112* 103* 109* 120* 110* 100* 115*  BUN <5* <5* <5* <5* <5* <5* 7*  CREATININE 0.83 0.87 0.92 0.95 0.79 1.00 1.37*  CALCIUM 8.4* 8.7* 8.6* 8.6* 8.4* 8.4* 8.1*  MG 1.5* 2.0  --  1.7  --  1.6* 2.3     GFR: Estimated Creatinine Clearance: 49.7 mL/min (A) (by C-G formula based on SCr of 1.37 mg/dL (H)).  Liver Function Tests: Recent Labs  Lab 05/23/21 0331  AST 17  ALT 10  ALKPHOS 102  BILITOT 2.2*  PROT 5.8*  ALBUMIN 2.5*    No results for input(s): LIPASE, AMYLASE in the last 168 hours. No results for input(s): AMMONIA in the last 168 hours.  Coagulation Profile: No results  for input(s): INR, PROTIME in the last 168 hours.  Cardiac Enzymes: No results for input(s): CKTOTAL, CKMB, CKMBINDEX, TROPONINI in the last 168 hours.  BNP (last 3 results) No results for input(s): PROBNP in the last 8760 hours.  Lipid Profile:  No results for input(s): CHOL, HDL, LDLCALC, TRIG, CHOLHDL, LDLDIRECT in the last 72 hours.  Thyroid Function Tests: No results for input(s): TSH, T4TOTAL, FREET4, T3FREE, THYROIDAB in the last 72 hours.  Anemia Panel: No results for input(s): VITAMINB12, FOLATE, FERRITIN, TIBC, IRON, RETICCTPCT in the last 72 hours.  Urine analysis:    Component Value Date/Time   COLORURINE YELLOW 05/13/2021 1654   APPEARANCEUR HAZY (A) 05/13/2021 1654   LABSPEC 1.018 05/13/2021 1654   PHURINE 6.0 05/13/2021 1654   GLUCOSEU NEGATIVE 05/13/2021 1654   HGBUR SMALL (A) 05/13/2021 1654   BILIRUBINUR NEGATIVE 05/13/2021 1654   KETONESUR 20 (A) 05/13/2021 1654   PROTEINUR 30 (A) 05/13/2021 1654   NITRITE NEGATIVE 05/13/2021 1654   LEUKOCYTESUR NEGATIVE 05/13/2021 1654    Sepsis Labs: Lactic Acid, Venous    Component Value Date/Time   LATICACIDVEN 1.4 05/13/2021 2025    MICROBIOLOGY: Recent Results (from the past 240 hour(s))  Urine Culture     Status: None   Collection Time: 05/15/21  1:16 PM   Specimen: Urine, Clean Catch  Result Value Ref Range Status   Specimen Description URINE, CLEAN CATCH  Final   Special Requests NONE  Final   Culture   Final    NO GROWTH Performed at [facility], [place] [address] [place], [place] [address]    Report Status 05/16/2021 FINAL  Final  MRSA Next Gen by PCR, Nasal     Status: None   Collection Time: 05/15/21  3:54 PM   Specimen: Nasal Mucosa; Nasal Swab  Result Value Ref Range Status   MRSA by PCR Next Gen NOT DETECTED NOT DETECTED Final    Comment: (NOTE) The GeneXpert MRSA Assay (FDA approved for NASAL specimens only), is one component of a comprehensive MRSA colonization surveillance program.  It is not intended to diagnose MRSA infection nor to guide or monitor treatment for MRSA infections. Test performance is not FDA approved in patients less than 72 years old. Performed at [facility], [place] [address], [place], [place] [address]   Culture, blood (routine x 2)     Status: None (Preliminary result)   Collection Time: 05/20/21 12:02 PM   Specimen: BLOOD  Result Value Ref Range Status   Specimen Description BLOOD RIGHT ANTECUBITAL  Final   Special Requests   Final    BOTTLES DRAWN AEROBIC AND ANAEROBIC Blood Culture adequate volume   Culture   Final    NO GROWTH 4 DAYS Performed at [facility], [place] [address] [place], [place] [address]    Report Status PENDING  Incomplete  Culture, blood (routine x 2)     Status: None (Preliminary result)   Collection Time: 05/20/21 12:02 PM   Specimen: BLOOD  Result Value Ref Range Status   Specimen Description BLOOD RIGHT ANTECUBITAL  Final   Special Requests   Final    BOTTLES DRAWN AEROBIC AND ANAEROBIC Blood Culture adequate volume   Culture   Final    NO GROWTH 4 DAYS Performed at [facility], [place] [address] [place], [place] [address]    Report Status PENDING  Incomplete  Culture, blood (routine x 2)     Status: None (Preliminary result)   Collection Time: 05/22/21  2:46 PM   Specimen: BLOOD  Result Value Ref Range Status   Specimen Description BLOOD RIGHT ANTECUBITAL  Final   Special Requests   Final    BOTTLES DRAWN AEROBIC AND ANAEROBIC Blood Culture adequate  volume   Culture   Final    NO GROWTH 2 DAYS Performed at [facility], [place] [address] [place], [place] [address]    Report Status PENDING  Incomplete  Culture, blood (routine x 2)     Status: None (Preliminary result)   Collection Time: 05/22/21  2:50 PM   Specimen: BLOOD RIGHT WRIST  Result Value Ref Range Status   Specimen Description BLOOD RIGHT WRIST  Final   Special Requests   Final    BOTTLES DRAWN AEROBIC AND ANAEROBIC Blood  Culture adequate volume   Culture   Final    NO GROWTH 2 DAYS Performed at [facility], [place] [address], [place], [place] [address]    Report Status PENDING  Incomplete    RADIOLOGY STUDIES/RESULTS: No results found.   LOS: 11 days   Oren Binet, MD  [facility]    To contact the attending provider between 7A-7P or the covering provider during after hours 7P-7A, please log into the web site [email or web address] and access using universal  password for that web site. If you do not have the password, please call the hospital operator.  05/24/2021, 11:59 AM

## 2021-05-25 DIAGNOSIS — I471 Supraventricular tachycardia: Secondary | ICD-10-CM | POA: Diagnosis not present

## 2021-05-25 DIAGNOSIS — N179 Acute kidney failure, unspecified: Secondary | ICD-10-CM | POA: Diagnosis not present

## 2021-05-25 DIAGNOSIS — R6520 Severe sepsis without septic shock: Secondary | ICD-10-CM | POA: Diagnosis not present

## 2021-05-25 DIAGNOSIS — Z952 Presence of prosthetic heart valve: Secondary | ICD-10-CM | POA: Diagnosis not present

## 2021-05-25 DIAGNOSIS — A021 Salmonella sepsis: Secondary | ICD-10-CM

## 2021-05-25 DIAGNOSIS — I330 Acute and subacute infective endocarditis: Secondary | ICD-10-CM | POA: Diagnosis not present

## 2021-05-25 DIAGNOSIS — A419 Sepsis, unspecified organism: Secondary | ICD-10-CM | POA: Diagnosis not present

## 2021-05-25 DIAGNOSIS — E1142 Type 2 diabetes mellitus with diabetic polyneuropathy: Secondary | ICD-10-CM | POA: Diagnosis not present

## 2021-05-25 LAB — BASIC METABOLIC PANEL
Anion gap: 10 (ref 5–15)
BUN: 11 mg/dL (ref 8–23)
CO2: 23 mmol/L (ref 22–32)
Calcium: 7.9 mg/dL — ABNORMAL LOW (ref 8.9–10.3)
Chloride: 104 mmol/L (ref 98–111)
Creatinine, Ser: 1.75 mg/dL — ABNORMAL HIGH (ref 0.61–1.24)
GFR, Estimated: 41 mL/min — ABNORMAL LOW (ref >60)
Glucose, Bld: 125 mg/dL — ABNORMAL HIGH (ref 70–99)
Potassium: 3 mmol/L — ABNORMAL LOW (ref 3.5–5.1)
Sodium: 137 mmol/L (ref 135–145)

## 2021-05-25 LAB — CBC
HCT: 43.4 % (ref 39.0–52.0)
Hemoglobin: 13.9 g/dL (ref 13.0–17.0)
MCH: 30.3 pg (ref 26.0–34.0)
MCHC: 32 g/dL (ref 30.0–36.0)
MCV: 94.6 fL (ref 80.0–100.0)
Platelets: 265 10*3/uL (ref 150–400)
RBC: 4.59 MIL/uL (ref 4.22–5.81)
RDW: 15.1 % (ref 11.5–15.5)
WBC: 9.4 10*3/uL (ref 4.0–10.5)
nRBC: 0 % (ref 0.0–0.2)

## 2021-05-25 LAB — CREATININE, SERUM
Creatinine, Ser: 1.58 mg/dL — ABNORMAL HIGH (ref 0.61–1.24)
GFR, Estimated: 46 mL/min — ABNORMAL LOW (ref >60)

## 2021-05-25 LAB — CULTURE, BLOOD (ROUTINE X 2)
Culture: NO GROWTH
Culture: NO GROWTH
Special Requests: ADEQUATE
Special Requests: ADEQUATE

## 2021-05-25 LAB — GLUCOSE, CAPILLARY
Glucose-Capillary: 101 mg/dL — ABNORMAL HIGH (ref 70–99)
Glucose-Capillary: 118 mg/dL — ABNORMAL HIGH (ref 70–99)
Glucose-Capillary: 112 mg/dL — ABNORMAL HIGH (ref 70–99)
Glucose-Capillary: 105 mg/dL — ABNORMAL HIGH (ref 70–99)

## 2021-05-25 LAB — GENTAMICIN LEVEL, RANDOM: Gentamicin Rm: 0.6 ug/mL

## 2021-05-25 MED ORDER — POTASSIUM CHLORIDE CRYS ER 20 MEQ PO TBCR
40.0000 meq | EXTENDED_RELEASE_TABLET | ORAL | Status: AC
Start: 2021-05-25 — End: 2021-05-25
  Administered 2021-05-25 (×2): 40 meq via ORAL
  Filled 2021-05-25 (×2): qty 2

## 2021-05-25 MED ORDER — GENTAMICIN VARIABLE DOSE PER UNSTABLE RENAL FUNCTION (PHARMACIST DOSING): Status: DC

## 2021-05-25 NOTE — Progress Notes (Addendum)
Needs to have                        PROGRESS NOTE        PATIENT DETAILS Name: Michael Hines Age: 75 y.o. Sex: male Date of Birth: 05-Jul-1946 Admit Date: 05/13/2021 Admitting Physician Kristopher Oppenheim, DO XLK:[id number], [name], MD  Brief Narrative: Patient is a 75 y.o. male with history of aortic valve replacement (bioprosthetic valve)-recent hospitalization (9/2-9/9) for MSSA bacteremia (TEE negative for endocarditis on 9/8)-completed IV Ancef x4 weeks.  Patient was referred to the ED from ID clinic on 10/3 for evaluation of encephalopathy/hypothermia.  Patient was thought to have SIRS-however blood cultures was negative-further hospital course was complicated by development of SVT requiring IV amiodarone and cardiology consultation--eventually patient underwent TEE on 10/12-which showed mitral valve vegetation.  See below for further details.  Subjective: Poor oral intake continues-remains mildly but very pleasantly confused.  Mittens remains in place.  Objective: Vitals: Blood pressure (!) 156/81, pulse 64, temperature 97.6 F (36.4 C), temperature source Oral, resp. rate 19, height 5\' 7"  (1.702 m), weight 84.3 kg, SpO2 93 %.   Exam: Gen Exam: Not in any distress-mildly confused-mittens remain in place.  HEENT:atraumatic, normocephalic Chest: B/L clear to auscultation anteriorly CVS:S1S2 regular Abdomen:soft non tender, non distended Extremities:no edema Neurology: Non focal Skin: no rash   Pertinent Labs/Radiology: Na: 137  K: 3.0  creatinine: 1.75  10/03: Blood culture: No growth 10/10>>Blood culture: No growth 10/12>> blood culture: No growth  10/07: EEG: No seizures. 10/07: CTA chest: No PE 10/08: MRI brain>> acute cortical infarct versus artifact. 10/08:>> CT angio head and neck: No high-grade stenosis. 10/12>> TEE: Endocarditis involving mitral valve, diffuse thickening of bioprosthetic aortic valve leaflets.   Assessment/Plan: Acute metabolic  encephalopathy: Due to endocarditis-possibly small septic emboli to brain-continues to have lingering encephalopathy-continue supportive care.    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  ID following-remains on nafcillin/rifampicin.  Gentamicin held due to AKI/therapeutic levels.   SVT: Maintaining sinus rhythm-initially on IV amiodarone/metoprolol-this has been switched to oral route.  Appreciate cardiology input.  History of aortic valve replacement with bioprosthetic valve  ?  Acute CVA (septic emboli) versus artifact: Reviewed neurology note-thought to be a artifact rather than a acute CVA-however study was limited due to motion artifact.  History of vascular parkinsonism: Prior MD-Dr. Dr. Tyrell Antonio- discussed with patient's primary neurologist-Sinemet on hold.  Oral thrush: On nystatin  AKI: Multifactorial-due to poor oral intake and possible gentamicin toxicity.  Repeat electrolytes tomorrow-continue supportive care-avoid nephrotoxic agents.  Gentamicin remains on hold.    Hypokalemia: Continue to replete and recheck.  Hyponatremia: Resolved-recheck electrolytes periodically.  HTN: BP fluctuating but otherwise stable-continue amlodipine/metoprolol.  HLD: Continue statin  DM-2 (A1c 7.9 on 9/2): CBG stable-continue SSI  Recent Labs    05/24/21 2036 05/25/21 0825 05/25/21 1139  GLUCAP 119* 101* 118*      COPD: Not in exacerbation-continue bronchodilators  Ascending thoracic aneurysm 4.8 cm: Radiology recommending semiannual imaging by CTA or MRA.  Failure to thrive syndrome/frailty/palliative care/goals of care: This MD has had several discussions with spouse/son over the past few days-palliative care also  following-family aware of poor prognosis-continued poor oral intake-ongoing encephalopathy.  Family leaning towards transitioning to comfort measures-palliative care planning to  reach out to family later today.  Will await further recommendations.  Procedures: None Consults: None DVT Prophylaxis: Lovenox Code Status:Full code Family Communication: Son at bedside   Time spent: 36 minutes-Greater than 50% of this time was spent in counseling, explanation of diagnosis, planning of further management, and coordination of care.  Diet: Diet Order             Diet Heart Room service appropriate? Yes; Fluid consistency: Thin  Diet effective now                      Disposition Plan: Status is: Inpatient  Remains inpatient appropriate because:Inpatient level of care appropriate due to severity of illness  Dispo: The patient is from: SNF              Anticipated d/c is to: SNF              Patient currently is not medically stable to d/c.   Difficult to place patient No   Barriers to Discharge: Ongoing/lingering encephalopathy-mitral valve endocarditis-restarting IV antibiotics.  Antimicrobial agents: Anti-infectives (From admission, onward)    Start     Dose/Rate Route Frequency Ordered Stop   05/23/21 1430  rifampin (RIFADIN) capsule 300 mg        300 mg Oral Every 8 hours 05/23/21 1332     05/23/21 1145  nafcillin 12 g in sodium chloride 0.9 % 500 mL continuous infusion        12 g 20.8 mL/hr over 24 Hours Intravenous Every 24 hours 05/23/21 1045     05/22/21 1800  ceFAZolin (ANCEF) IVPB 2g/100 mL premix  Status:  Discontinued        2 g 200 mL/hr over 30 Minutes Intravenous Every 8 hours 05/22/21 1439 05/22/21 1447   05/22/21 1700  gentamicin (GARAMYCIN) IVPB 80 mg  Status:  Discontinued        80 mg 100 mL/hr over 30 Minutes Intravenous Every 8 hours 05/22/21 1507 05/24/21 0949   05/22/21 1600  nafcillin 2 g in sodium chloride 0.9 % 100 mL IVPB  Status:  Discontinued         2 g 200 mL/hr over 30 Minutes Intravenous Every 4 hours 05/22/21 1507 05/23/21 1045   05/22/21 1600  rifampin (RIFADIN) 300 mg in sodium chloride 0.9 % 100 mL IVPB  Status:  Discontinued        300 mg 200 mL/hr over 30 Minutes Intravenous Every 8 hours 05/22/21 1507 05/23/21 1334   05/14/21 2000  vancomycin (VANCOREADY) IVPB 750 mg/150 mL  Status:  Discontinued        750 mg 150 mL/hr over 60 Minutes Intravenous Every 24 hours 05/13/21 1902 05/14/21 1011   05/14/21 1900  vancomycin (VANCOREADY) IVPB 1500 mg/300 mL  Status:  Discontinued        1,500 mg 150 mL/hr over 120 Minutes Intravenous Every 24 hours 05/14/21 1011 05/14/21 1418   05/14/21 1400  ceFEPIme (MAXIPIME) 2 g in sodium chloride 0.9 % 100 mL IVPB  Status:  Discontinued        2 g 200 mL/hr over 30 Minutes Intravenous Every 8 hours 05/14/21 1000 05/15/21 0947   05/14/21 0600  ceFEPIme (MAXIPIME) 2 g in sodium chloride 0.9 % 100 mL IVPB  Status:  Discontinued        2 g 200 mL/hr over 30 Minutes Intravenous Every 12 hours 05/14/21 0201 05/14/21 1000   05/14/21 0100  ceFEPIme (MAXIPIME) 2 g  in sodium chloride 0.9 % 100 mL IVPB  Status:  Discontinued        2 g 200 mL/hr over 30 Minutes Intravenous Every 8 hours 05/13/21 1710 05/14/21 0200   05/13/21 1700  ceFEPIme (MAXIPIME) 2 g in sodium chloride 0.9 % 100 mL IVPB        2 g 200 mL/hr over 30 Minutes Intravenous  Once 05/13/21 1654 05/13/21 1903   05/13/21 1700  vancomycin (VANCOREADY) IVPB 1750 mg/350 mL        1,750 mg 175 mL/hr over 120 Minutes Intravenous  Once 05/13/21 1654 05/13/21 2103        MEDICATIONS: Scheduled Meds:  amiodarone  200 mg Oral Daily   amLODipine  5 mg Oral Daily   atorvastatin  40 mg Oral QHS   clotrimazole   Topical BID   enoxaparin (LOVENOX) injection  40 mg Subcutaneous Q24H   fluticasone furoate-vilanterol  1 puff Inhalation Daily   insulin aspart  0-15 Units Subcutaneous TID WC   insulin aspart  0-5 Units Subcutaneous QHS    metoprolol tartrate  25 mg Oral BID   nystatin  5 mL Oral QID   rifampin  300 mg Oral Q8H   sodium chloride flush  10-40 mL Intracatheter Q12H   Continuous Infusions:  dextrose 5 % and 0.9% NaCl 75 mL/hr at 05/24/21 2327   lactated ringers     nafcillin (NAFCIL) continuous infusion 12 g (05/25/21 1326)   PRN Meds:.acetaminophen **OR** acetaminophen, ipratropium-albuterol, melatonin, ondansetron **OR** ondansetron (ZOFRAN) IV, sodium chloride flush   I have personally reviewed following labs and imaging studies  LABORATORY DATA: CBC: Recent Labs  Lab 05/20/21 0949 05/21/21 0621 05/22/21 0346 05/23/21 0331 05/25/21 0421  WBC 8.6 10.2 9.0 9.7 9.4  HGB 14.6 15.6 15.2 15.2 13.9  HCT 44.2 47.2 45.7 46.5 43.4  MCV 93.2 92.0 92.3 92.8 94.6  PLT 238 251 246 235 265     Basic Metabolic Panel: Recent Labs  Lab 05/19/21 0131 05/20/21 0949 05/21/21 0621 05/22/21 0346 05/23/21 0331 05/24/21 0227 05/25/21 0421 05/25/21 1159  NA 132*   < > 136 132* 136 136 137  --   K 3.5   < > 3.5 3.3* 3.0* 3.2* 3.0*  --   CL 97*   < > 99 98 101 104 104  --   CO2 27   < > 28 25 25  21* 23  --   GLUCOSE 103*   < > 120* 110* 100* 115* 125*  --   BUN <5*   < > <5* <5* <5* 7* 11  --   CREATININE 0.87   < > 0.95 0.79 1.00 1.37* 1.75* 1.58*  CALCIUM 8.7*   < > 8.6* 8.4* 8.4* 8.1* 7.9*  --   MG 2.0  --  1.7  --  1.6* 2.3  --   --    < > = values in this interval not displayed.     GFR: Estimated Creatinine Clearance: 43.2 mL/min (A) (by C-G formula based on SCr of 1.58 mg/dL (H)).  Liver Function Tests: Recent Labs  Lab 05/23/21 0331  AST 17  ALT 10  ALKPHOS 102  BILITOT 2.2*  PROT 5.8*  ALBUMIN 2.5*    No results for input(s): LIPASE, AMYLASE in the last 168 hours. No results for input(s): AMMONIA in the last 168 hours.  Coagulation Profile: No results for input(s): INR, PROTIME in the last 168 hours.  Cardiac Enzymes: No results for input(s): CKTOTAL, CKMB,  CKMBINDEX,  TROPONINI in the last 168 hours.  BNP (last 3 results) No results for input(s): PROBNP in the last 8760 hours.  Lipid Profile: No results for input(s): CHOL, HDL, LDLCALC, TRIG, CHOLHDL, LDLDIRECT in the last 72 hours.  Thyroid Function Tests: No results for input(s): TSH, T4TOTAL, FREET4, T3FREE, THYROIDAB in the last 72 hours.  Anemia Panel: No results for input(s): VITAMINB12, FOLATE, FERRITIN, TIBC, IRON, RETICCTPCT in the last 72 hours.  Urine analysis:    Component Value Date/Time   COLORURINE YELLOW 05/13/2021 1654   APPEARANCEUR HAZY (A) 05/13/2021 1654   LABSPEC 1.018 05/13/2021 1654   PHURINE 6.0 05/13/2021 1654   GLUCOSEU NEGATIVE 05/13/2021 1654   HGBUR SMALL (A) 05/13/2021 1654   BILIRUBINUR NEGATIVE 05/13/2021 1654   KETONESUR 20 (A) 05/13/2021 1654   PROTEINUR 30 (A) 05/13/2021 1654   NITRITE NEGATIVE 05/13/2021 1654   LEUKOCYTESUR NEGATIVE 05/13/2021 1654    Sepsis Labs: Lactic Acid, Venous    Component Value Date/Time   LATICACIDVEN 1.4 05/13/2021 2025    MICROBIOLOGY: Recent Results (from the past 240 hour(s))  MRSA Next Gen by PCR, Nasal     Status: None   Collection Time: 05/15/21  3:54 PM   Specimen: Nasal Mucosa; Nasal Swab  Result Value Ref Range Status   MRSA by PCR Next Gen NOT DETECTED NOT DETECTED Final    Comment: (NOTE) The GeneXpert MRSA Assay (FDA approved for NASAL specimens only), is one component of a comprehensive MRSA colonization surveillance program. It is not intended to diagnose MRSA infection nor to guide or monitor treatment for MRSA infections. Test performance is not FDA approved in patients less than 31 years old. Performed at [facility], [place] [address] [place], [place] [address]   Culture, blood (routine x 2)     Status: None   Collection Time: 05/20/21 12:02 PM   Specimen: BLOOD  Result Value Ref Range Status   Specimen Description BLOOD RIGHT ANTECUBITAL  Final   Special Requests   Final    BOTTLES  DRAWN AEROBIC AND ANAEROBIC Blood Culture adequate volume   Culture   Final    NO GROWTH 5 DAYS Performed at [facility], [place] [address], [place], [place] [address]    Report Status 05/25/2021 FINAL  Final  Culture, blood (routine x 2)     Status: None   Collection Time: 05/20/21 12:02 PM   Specimen: BLOOD  Result Value Ref Range Status   Specimen Description BLOOD RIGHT ANTECUBITAL  Final   Special Requests   Final    BOTTLES DRAWN AEROBIC AND ANAEROBIC Blood Culture adequate volume   Culture   Final    NO GROWTH 5 DAYS Performed at [facility], [place] [address] [place], [place] [address]    Report Status 05/25/2021 FINAL  Final  Culture, blood (routine x 2)     Status: None (Preliminary result)   Collection Time: 05/22/21  2:46 PM   Specimen: BLOOD  Result Value Ref Range Status   Specimen Description BLOOD RIGHT ANTECUBITAL  Final   Special Requests   Final    BOTTLES DRAWN AEROBIC AND ANAEROBIC Blood Culture adequate volume   Culture   Final    NO GROWTH 3 DAYS Performed at [facility], [place] [address], [place], [place] [address]    Report Status PENDING  Incomplete  Culture, blood (routine x 2)     Status: None (Preliminary result)   Collection Time: 05/22/21  2:50 PM   Specimen: BLOOD RIGHT WRIST  Result Value Ref Range Status   Specimen Description BLOOD RIGHT WRIST  Final   Special Requests   Final    BOTTLES DRAWN AEROBIC AND ANAEROBIC Blood Culture adequate volume   Culture   Final    NO GROWTH 3 DAYS Performed at [facility], [address] [place], [place] [address]    Report Status PENDING  Incomplete    RADIOLOGY STUDIES/RESULTS: No results found.   LOS: 12 days   Oren Binet, MD  [facility]    To contact the attending provider between 7A-7P or the covering provider during after hours 7P-7A, please log into the web site [email or web address] and access using universal [place] password for that web site. If you do  not have the password, please call the hospital operator.  05/25/2021, 2:03 PM

## 2021-05-25 NOTE — Progress Notes (Addendum)
Palliative Medicine Inpatient Follow Up Note     Chart Reviewed. Patient assessed at the bedside.   [name] remains confused. Will answer some questions appropriately and per son has moments of clarity. Oral intake is poor with little to none.   Son/POA Clare Gandy.) is at the bedside. Extensive discussion and review of goals of care. Ronalee Belts is tearful expressing he does not like seeing his father in current state and he, his brother, and patient's wife are all in agreement with focusing on his comfort vs focusing on continued medical interventions and treatments not producing any meaningful improvement. Emotional support provided.   Detailed education regarding comfort focused care while hospitalized and hospice's goals and philosophy of care. Ronalee Belts states he would want patient to go to [facility] once final decisions were made. Acknowledged request. Education provided on referral process in addition to awareness this would not happen over the weekend however could reach out to [facility] on Monday. He verbalized understanding, he would like to hold off on referral until family are all on the same page.   Son shares his frustration with final decisions as his aunt/uncle seem to not be in agreement with his decisions. He wishes for them to be accepting as they have a close relationship with his father. Ronalee Belts is hopeful after they have had a chance to speak further patient's siblings will be onboard and they can make the best decision for Michael Hines.   Clare Gandy. Is requesting time to continue ongoing family discussions to gain support from everyone. He is aware he is patient's HCPOA in addition to full support from his brother and step-mother.   Discussed the importance of continued conversation with family and their  medical providers regarding overall plan of care and treatment options, ensuring decisions are within the context of the patients values and GOCs.  1555: Spoke with son, Ronalee Belts to  follow up. He shares his aunt and uncle are discussing amongst themselves but seems to be "coming around!" I discussed as family make final decisions, given his wishes of wanting to focus on comfort would recommend at minimum DNR/DNI. Ronalee Belts verbalized understanding and confirms wishes for DNR. Education provided on what DNR would look like. He again confirms wishes expressing he is hopeful to have a decision soon. Support provided.    Questions addressed and support provided.  Ronalee Belts has my contact information and plans to give me a call once he is ready to move forward to transitioning care to comfort focused.   Objective Assessment: Vital Signs Vitals:   05/25/21 0830 05/25/21 1137  BP: (!) 155/84 (!) 156/81  Pulse: 71 64  Resp: 20 19  Temp: 97.8 F (36.6 C) 97.6 F (36.4 C)  SpO2: 98% 93%    Intake/Output Summary (Last 24 hours) at 05/25/2021 1208 Last data filed at 05/25/2021 0835 Gross per 24 hour  Intake --  Output 1225 ml  Net -1225 ml   Last Weight  Most recent update: 05/25/2021  5:30 AM    Weight  84.3 kg (185 lb 13.6 oz)            Gen:  NAD, frail, chronically-ill appearing  CV: Regular rate and rhythm, no murmurs rubs or gallops PULM: clear to auscultation bilaterally. No wheezes/rales/rhonchi ABD: soft/nontender/nondistended/normal bowel sounds EXT: No edema Neuro: confused, mittens in place.   SUMMARY OF RECOMMENDATIONS   Continue with current plan of care per medical team  Son/HCPOA Clare Gandy), his brother, and step-mother are all in  agreement with comfort focused care however patient's siblings are not quite there. Ronalee Belts would like their support prior to making final decisions. He has requested additional time to speak with them and gain their support. He has my contact information and plans to call once this has been completed and they have come to a decision.  Comfort care orders placed under S&H to release by RN once family provides final decisions to  decrease any delays in care or needs/symptom management. Son is in agreement.  Morphine PRN for pain/air hunger/comfort Robinul PRN for excessive secretions Ativan PRN for agitation/anxiety Zofran PRN for nausea Liquifilm tears PRN for dry eyes Haldol PRN for agitation/anxiety May have comfort feeding Comfort cart for family Unrestricted visitations in the setting of EOL (per policy) Oxygen PRN 2L or less for comfort. No escalation.   TOC referral for residential hospice facility (family's preference is [facility]) Discontinue telemetry and all medical interventions/medications not comfort focused. PMT will continue to support and follow. I am off service after today. Please call team line with urgent needs.   Discussed with Dr. Sloan Leiter and RN via secured chat.   Time Total: 55 min.   Visit consisted of counseling and education dealing with the complex and emotionally intense issues of symptom management and palliative care in the setting of serious and potentially life-threatening illness.Greater than 50%  of this time was spent counseling and coordinating care related to the above assessment and plan.  Alda Lea, AGPCNP-BC  Palliative Medicine Team [phone]  Palliative Medicine Team providers are available by phone from 7am to 7pm daily and can be reached through the team cell phone. Should this patient require assistance outside of these hours, please call the patient's attending physician.

## 2021-05-25 NOTE — Plan of Care (Signed)
  Problem: Clinical Measurements: Goal: Will remain free from infection Outcome: Progressing Goal: Respiratory complications will improve Outcome: Progressing Goal: Cardiovascular complication will be avoided Outcome: Progressing   Problem: Fluid Volume: Goal: Hemodynamic stability will improve Outcome: Progressing

## 2021-05-26 ENCOUNTER — Encounter (HOSPITAL_COMMUNITY): Payer: Self-pay | Admitting: Internal Medicine

## 2021-05-26 DIAGNOSIS — I76 Septic arterial embolism: Secondary | ICD-10-CM | POA: Diagnosis not present

## 2021-05-26 DIAGNOSIS — A021 Salmonella sepsis: Secondary | ICD-10-CM | POA: Diagnosis not present

## 2021-05-26 DIAGNOSIS — Z7189 Other specified counseling: Secondary | ICD-10-CM | POA: Diagnosis not present

## 2021-05-26 DIAGNOSIS — A419 Sepsis, unspecified organism: Secondary | ICD-10-CM | POA: Diagnosis not present

## 2021-05-26 DIAGNOSIS — Z952 Presence of prosthetic heart valve: Secondary | ICD-10-CM | POA: Diagnosis not present

## 2021-05-26 DIAGNOSIS — I471 Supraventricular tachycardia: Secondary | ICD-10-CM | POA: Diagnosis not present

## 2021-05-26 DIAGNOSIS — N179 Acute kidney failure, unspecified: Secondary | ICD-10-CM | POA: Diagnosis not present

## 2021-05-26 DIAGNOSIS — R6520 Severe sepsis without septic shock: Secondary | ICD-10-CM | POA: Diagnosis not present

## 2021-05-26 DIAGNOSIS — Z66 Do not resuscitate: Secondary | ICD-10-CM

## 2021-05-26 LAB — GLUCOSE, CAPILLARY
Glucose-Capillary: 95 mg/dL (ref 70–99)
Glucose-Capillary: 99 mg/dL (ref 70–99)
Glucose-Capillary: 110 mg/dL — ABNORMAL HIGH (ref 70–99)
Glucose-Capillary: 94 mg/dL (ref 70–99)

## 2021-05-26 LAB — MAGNESIUM: Magnesium: 1.5 mg/dL — ABNORMAL LOW (ref 1.7–2.4)

## 2021-05-26 LAB — BASIC METABOLIC PANEL
Anion gap: 10 (ref 5–15)
BUN: 6 mg/dL — ABNORMAL LOW (ref 8–23)
CO2: 23 mmol/L (ref 22–32)
Calcium: 7.4 mg/dL — ABNORMAL LOW (ref 8.9–10.3)
Chloride: 106 mmol/L (ref 98–111)
Creatinine, Ser: 1.4 mg/dL — ABNORMAL HIGH (ref 0.61–1.24)
GFR, Estimated: 53 mL/min — ABNORMAL LOW (ref >60)
Glucose, Bld: 204 mg/dL — ABNORMAL HIGH (ref 70–99)
Potassium: 2.5 mmol/L — CL (ref 3.5–5.1)
Sodium: 139 mmol/L (ref 135–145)

## 2021-05-26 MED ORDER — MORPHINE SULFATE (PF) 2 MG/ML IV SOLN: 1.0000 mg | INTRAVENOUS | Status: DC | PRN

## 2021-05-26 MED ORDER — HALOPERIDOL LACTATE 5 MG/ML IJ SOLN: 0.5000 mg | INTRAMUSCULAR | Status: DC | PRN

## 2021-05-26 MED ORDER — BIOTENE DRY MOUTH MT LIQD: 15.0000 mL | OROMUCOSAL | Status: DC | PRN

## 2021-05-26 MED ORDER — POTASSIUM CHLORIDE 10 MEQ/100ML IV SOLN
10.0000 meq | INTRAVENOUS | Status: AC
  Administered 2021-05-26 (×4): 10 meq via INTRAVENOUS
  Filled 2021-05-26: qty 100

## 2021-05-26 MED ORDER — POTASSIUM CHLORIDE CRYS ER 20 MEQ PO TBCR
40.0000 meq | EXTENDED_RELEASE_TABLET | ORAL | Status: DC
  Administered 2021-05-26: 40 meq via ORAL
  Filled 2021-05-26: qty 2

## 2021-05-26 MED ORDER — MAGNESIUM SULFATE 2 GM/50ML IV SOLN
2.0000 g | Freq: Once | INTRAVENOUS | Status: AC
  Administered 2021-05-26: 2 g via INTRAVENOUS
  Filled 2021-05-26: qty 50

## 2021-05-26 MED ORDER — POLYVINYL ALCOHOL 1.4 % OP SOLN: 1.0000 [drp] | Freq: Four times a day (QID) | OPHTHALMIC | Status: DC | PRN

## 2021-05-26 MED ORDER — LORAZEPAM 2 MG/ML IJ SOLN: 1.0000 mg | INTRAMUSCULAR | Status: DC | PRN

## 2021-05-26 MED ORDER — GLYCOPYRROLATE 0.2 MG/ML IJ SOLN: 0.3000 mg | INTRAMUSCULAR | Status: DC | PRN

## 2021-05-26 MED ORDER — POTASSIUM CHLORIDE CRYS ER 20 MEQ PO TBCR
40.0000 meq | EXTENDED_RELEASE_TABLET | Freq: Once | ORAL | Status: DC
  Filled 2021-05-26: qty 2

## 2021-05-26 MED ORDER — FLUTICASONE FUROATE-VILANTEROL 100-25 MCG/INH IN AEPB: 1.0000 | INHALATION_SPRAY | Freq: Every day | RESPIRATORY_TRACT | Status: DC | PRN

## 2021-05-26 NOTE — Progress Notes (Signed)
Pt vomited up morning medication. Nurse able to re-administer amiodarone, amlodipine and metoprolol but he refused to take the po potassium 40 meq. MD made aware.

## 2021-05-26 NOTE — Progress Notes (Signed)
     Received a call from patient's son, Clare Gandy. (HCPOA) expressing all family has mutually come to agreement to transition all care to comfort focused. He is tearful expressing his heartbreak watching his father have no quality of life and show no signs of improvement despite efforts. He knows that his father would not want to live in such a state.   Re-educated son on comfort focused care while hospitalized. He verbalized understanding again confirming wishes for comfort. He shares patient's wife is at the bedside and he and brother plans to visit tomorrow.   Ronalee Belts is requesting [employer] referral. Education provided on referral process. He is aware he will receive a call tomorrow from [facility] and most likely [facility]'s RN hospital liaison. I encouraged son to answer calls or clear VM as my colleague attempted to also reach him and follow up today and VM was full. He verbalized understanding.   All questions answered and support provided.   Spoke to [place], Therapist, sports and provided updates.   Plan -Discontinue all medical interventions/medications not comfort focused -TOC referral for [facility] as requested by family -Morphine PRN for pain/air hunger/comfort Robinul PRN for excessive secretions Ativan PRN for agitation/anxiety Zofran PRN for nausea Liquifilm tears PRN for dry eyes Haldol PRN for agitation/anxiety May have comfort feeding Comfort cart for family Unrestricted visitations in the setting of EOL (per policy) Oxygen PRN 2L or less for comfort. No escalation.   My colleague Leonard Downing, NP will continue to support and follow.   Time Total: 40 min.   Visit consisted of counseling and education dealing with the complex and emotionally intense issues of symptom management and palliative care in the setting of serious and potentially life-threatening illness.Greater than 50%  of this time was spent counseling and coordinating care related to the above assessment and plan.  Alda Lea, AGPCNP-BC  Palliative Medicine Team [phone]

## 2021-05-26 NOTE — Progress Notes (Signed)
Daily Progress Note   Patient Name: Michael Hines       Date: 05/26/2021 DOB: 21-Apr-1946  Age: 75 y.o. MRN#: [id number] Attending Physician: Jonetta Osgood, MD Primary Care Physician: Finis Bud, MD Admit Date: 05/13/2021  Reason for Consultation/Follow-up: Establishing goals of care  Patient Profile/HPI:  Patient is a 75 y.o. male with history of aortic valve replacement (bioprosthetic valve), vascular parkinsonism, -recent hospitalization (9/2-9/9) for MSSA bacteremia (TEE negative for endocarditis on 9/8)-completed IV Ancef x4 weeks.  Patient was referred to the ED from ID clinic on 10/3 for evaluation of encephalopathy/hypothermia.  Patient was thought to have SIRS-however blood cultures was negative-further hospital course was complicated by development of SVT requiring IV amiodarone and cardiology consultation--eventually patient underwent TEE on 10/12-which showed mitral valve vegetation. Recovery has been complicated by ongoing encepholopathy, possibly septic emboli. He is not a candidate for surgical intervention for valve vegetation.     Subjective: Patient is resting comfortably. He had vomiting this morning. He is eating and drinking minimally. His spouse- Dianne- at bedside.  No decisions regarding transition to comfort care have been made yet. Patient's sibling is coming this afternoon for further discussion with family. Levander Campion is hopeful that final decision will be made by tomorrow.  Support provided as she explored the difficulty of decision making and wanting to limit patient's suffering. She is confident that patient would want comfort and would not consider his current state and prospects of well being a quality of life that he would wish to prolong.  I called  patient's son, who is HCPOA- Docia Furl, Brooke Bonito, however, there was no answer and VM was full.    Review of Systems  Unable to perform ROS: Mental acuity    Physical Exam Vitals and nursing note reviewed.  Constitutional:      Appearance: He is ill-appearing.  Neurological:     Comments: sleeping            Vital Signs: BP (!) 156/72 (BP Location: Left Arm)   Pulse (!) 47   Temp 97.7 F (36.5 C) (Oral)   Resp 17   Ht 5\' 7"  (1.702 m)   Wt 86.4 kg   SpO2 95%   BMI 29.83 kg/m  SpO2: SpO2: 95 % O2 Device: O2 Device: Nasal Cannula O2 Flow Rate: O2  Flow Rate (L/min): 1 L/min  Intake/output summary:  Intake/Output Summary (Last 24 hours) at 05/26/2021 1413 Last data filed at 05/26/2021 1343 Gross per 24 hour  Intake 4307.98 ml  Output 800 ml  Net 3507.98 ml   LBM: Last BM Date: 05/26/21 Baseline Weight: Weight: 90.7 kg Most recent weight: Weight: 86.4 kg       Palliative Assessment/Data: PPS: 20%      Patient Active Problem List   Diagnosis Date Noted  . Prosthetic valve endocarditis ([place])   . 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 SVT (supraventricular tachycardia) ([place])   . Tachycardia 05/13/2021  . Tachypnea 05/13/2021  . Hypotension 05/13/2021  . Hypothermia 05/13/2021  . Sepsis with acute organ dysfunction ([place]) 05/13/2021  . AKI (acute kidney injury) ([place]) 05/13/2021  . Sepsis with acute organ dysfunction without septic shock ([place]) 05/13/2021  . Acute respiratory failure with hypoxia ([place])   . S/P AVR   . Parkinson's disease ([place])   . Diabetic polyneuropathy associated with type 2 diabetes mellitus ([place])   . Chronic bilateral low back pain without sciatica   . COPD exacerbation ([place]) 04/12/2021  . Thoracic aortic aneurysm without rupture 09/14/2019  . Delirium 09/09/2018  . Hypokalemia 09/09/2018  . MSSA bacteremia   . Diabetes mellitus without complication ([place]) 56/70/1410  . Polycythemia 09/01/2018  . Abnormal LFTs 09/01/2018  . Hypophosphatemia  09/01/2018  . Altered mental status 09/01/2018    Palliative Care Assessment & Plan    Assessment/Recommendations/Plan  Continue current scope- family has not yet made decision regarding path of care PMT will check in with family tomorrow- comfort orders are signed and held if family communicates decision to staff later in the day   Code Status: DNR  Prognosis:  Unable to determine  Discharge Planning: To Be Determined  Care plan was discussed with patient's spouse  Thank you for allowing the Palliative Medicine Team to assist in the care of this patient.  Total time: 35 mins Prolonged billing: No     Greater than 50%  of this time was spent counseling and coordinating care related to the above assessment and plan.  Mariana Kaufman, AGNP-C Palliative Medicine   Please contact Palliative Medicine Team phone at [phone] for questions and concerns.

## 2021-05-26 NOTE — Progress Notes (Signed)
Pt refused all 2200 medications.

## 2021-05-26 NOTE — Progress Notes (Signed)
Needs to have                        PROGRESS NOTE        PATIENT DETAILS Name: Michael Hines Age: 75 y.o. Sex: male Date of Birth: 1946/04/05 Admit Date: 05/13/2021 Admitting Physician Kristopher Oppenheim, DO NFA:[id number], [name], MD  Brief Narrative: Patient is a 75 y.o. male with history of aortic valve replacement (bioprosthetic valve)-recent hospitalization (9/2-9/9) for MSSA bacteremia (TEE negative for endocarditis on 9/8)-completed IV Ancef x4 weeks.  Patient was referred to the ED from ID clinic on 10/3 for evaluation of encephalopathy/hypothermia.  Patient was thought to have SIRS-however blood cultures was negative-further hospital course was complicated by development of SVT requiring IV amiodarone and cardiology consultation--eventually patient underwent TEE on 10/12-which showed mitral valve vegetation.  See below for further details.  Subjective: Sleeping-had 1 episode of vomiting this morning.  Spouse at bedside-confirms no oral intake over the past several days-only drinking sips of water/juice.  Objective: Vitals: Blood pressure (!) 156/72, pulse (!) 47, temperature 97.7 F (36.5 C), temperature source Oral, resp. rate 17, height 5\' 7"  (1.702 m), weight 86.4 kg, SpO2 95 %.   Exam: Gen Exam: Lying comfortably in bed-not in any distress. HEENT:atraumatic, normocephalic Chest: B/L clear to auscultation anteriorly CVS:S1S2 regular Abdomen:soft non tender, non distended Extremities:no edema Neurology: Non focal-severe generalized weakness. Skin: no rash   Pertinent Labs/Radiology: Na: 139 K: 2.5 creatinine: 1.40 Magnesium: 1.5  10/03: Blood culture: No growth 10/10>>Blood culture: No growth 10/12>> blood culture: No growth  10/07: EEG: No seizures. 10/07: CTA chest: No PE 10/08: MRI brain>> acute cortical infarct versus artifact. 10/08:>> CT angio head and neck: No high-grade stenosis. 10/12>> TEE: Endocarditis involving mitral valve, diffuse thickening of  bioprosthetic aortic valve leaflets.   Assessment/Plan: Acute metabolic encephalopathy: Due to endocarditis-possibly small septic emboli to brain-continues to have lingering encephalopathy and severe failure to thrive syndrome with very poor oral intake over the past several days-continue supportive care.  Poor overall prognosis-palliative care engaging with family regarding further steps.  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  ID following-remains on nafcillin/rifampicin.  Gentamicin held due to AKI/therapeutic levels.   SVT: Maintaining sinus rhythm-initially on IV amiodarone/metoprolol-this has been switched to oral route.  Appreciate cardiology input.  History of aortic valve replacement with bioprosthetic valve  ?  Acute CVA (septic emboli) versus artifact: Reviewed neurology note-thought to be a artifact rather than a acute CVA-however study was limited due to motion artifact.  History of vascular parkinsonism: Prior MD-Dr. Dr. Tyrell Antonio- discussed with patient's primary neurologist-Sinemet on hold.  Oral thrush: On nystatin  AKI: Multifactorial-due to poor oral intake and possible gentamicin toxicity.  Gentamicin remains on hold-continue supportive care-repeat electrolytes tomorrow.  Hypokalemia: Replete and recheck.  Hypomagnesemia: Replete and recheck.  Hyponatremia: Resolved-recheck electrolytes periodically.  HTN: BP fluctuating but otherwise stable-continue amlodipine/metoprolol.  HLD: Continue statin  DM-2 (A1c 7.9 on 9/2): CBG stable-continue SSI  Recent Labs    05/25/21 2041 05/26/21 0752 05/26/21 1236  GLUCAP 105* 95 99      COPD: Not in exacerbation-continue bronchodilators  Ascending  thoracic aneurysm 4.8 cm: Radiology recommending semiannual imaging by CTA or MRA.  Failure to thrive syndrome/frailty/palliative care/goals of care: Continues to slowly  deteriorate-very weak/debilitated-no significant oral intake for almost 5 days-per spouse at bedside only able to take sips of water.  This MD has had several discussions with spouse/son over the past several days-all understand that patient is likely going to deteriorate-and prognosis is very poor.  Palliative care involved and is engaging with family-made DNR on 10/15.  Palliative care to follow-up with family to see if they have made a decision to transition to comfort/hospice care.  Will await further recommendations from palliative care team.    Procedures: None Consults: None DVT Prophylaxis: Lovenox Code Status:Full code Family Communication: Son at bedside   Time spent: 59 minutes-Greater than 50% of this time was spent in counseling, explanation of diagnosis, planning of further management, and coordination of care.  Diet: Diet Order             Diet Heart Room service appropriate? Yes; Fluid consistency: Thin  Diet effective now                      Disposition Plan: Status is: Inpatient  Remains inpatient appropriate because:Inpatient level of care appropriate due to severity of illness  Dispo: The patient is from: SNF              Anticipated d/c is to: SNF              Patient currently is not medically stable to d/c.   Difficult to place patient No   Barriers to Discharge: Ongoing/lingering encephalopathy-mitral valve endocarditis-restarting IV antibiotics.  Antimicrobial agents: Anti-infectives (From admission, onward)    Start     Dose/Rate Route Frequency Ordered Stop   05/25/21 1503  gentamicin variable dose per unstable renal function (pharmacist dosing)         Does not apply See admin instructions 05/25/21 1504     05/23/21 1430  rifampin (RIFADIN) capsule 300 mg        300 mg  Oral Every 8 hours 05/23/21 1332     05/23/21 1145  nafcillin 12 g in sodium chloride 0.9 % 500 mL continuous infusion        12 g 20.8 mL/hr over 24 Hours Intravenous Every 24 hours 05/23/21 1045     05/22/21 1800  ceFAZolin (ANCEF) IVPB 2g/100 mL premix  Status:  Discontinued        2 g 200 mL/hr over 30 Minutes Intravenous Every 8 hours 05/22/21 1439 05/22/21 1447   05/22/21 1700  gentamicin (GARAMYCIN) IVPB 80 mg  Status:  Discontinued        80 mg 100 mL/hr over 30 Minutes Intravenous Every 8 hours 05/22/21 1507 05/24/21 0949   05/22/21 1600  nafcillin 2 g in sodium chloride 0.9 % 100 mL IVPB  Status:  Discontinued        2 g 200 mL/hr over 30 Minutes Intravenous Every 4 hours 05/22/21 1507 05/23/21 1045   05/22/21 1600  rifampin (RIFADIN) 300 mg in sodium chloride 0.9 % 100 mL IVPB  Status:  Discontinued        300 mg 200 mL/hr over 30 Minutes Intravenous Every 8 hours 05/22/21 1507 05/23/21 1334   05/14/21 2000  vancomycin (VANCOREADY) IVPB 750 mg/150 mL  Status:  Discontinued        750 mg 150 mL/hr over 60 Minutes Intravenous Every 24 hours 05/13/21 1902 05/14/21 1011   05/14/21 1900  vancomycin (VANCOREADY) IVPB 1500 mg/300 mL  Status:  Discontinued        1,500  mg 150 mL/hr over 120 Minutes Intravenous Every 24 hours 05/14/21 1011 05/14/21 1418   05/14/21 1400  ceFEPIme (MAXIPIME) 2 g in sodium chloride 0.9 % 100 mL IVPB  Status:  Discontinued        2 g 200 mL/hr over 30 Minutes Intravenous Every 8 hours 05/14/21 1000 05/15/21 0947   05/14/21 0600  ceFEPIme (MAXIPIME) 2 g in sodium chloride 0.9 % 100 mL IVPB  Status:  Discontinued        2 g 200 mL/hr over 30 Minutes Intravenous Every 12 hours 05/14/21 0201 05/14/21 1000   05/14/21 0100  ceFEPIme (MAXIPIME) 2 g in sodium chloride 0.9 % 100 mL IVPB  Status:  Discontinued        2 g 200 mL/hr over 30 Minutes Intravenous Every 8 hours 05/13/21 1710 05/14/21 0200   05/13/21 1700  ceFEPIme (MAXIPIME) 2 g in sodium chloride 0.9 %  100 mL IVPB        2 g 200 mL/hr over 30 Minutes Intravenous  Once 05/13/21 1654 05/13/21 1903   05/13/21 1700  vancomycin (VANCOREADY) IVPB 1750 mg/350 mL        1,750 mg 175 mL/hr over 120 Minutes Intravenous  Once 05/13/21 1654 05/13/21 2103        MEDICATIONS: Scheduled Meds:  amiodarone  200 mg Oral Daily   amLODipine  5 mg Oral Daily   atorvastatin  40 mg Oral QHS   clotrimazole   Topical BID   enoxaparin (LOVENOX) injection  40 mg Subcutaneous Q24H   fluticasone furoate-vilanterol  1 puff Inhalation Daily   gentamicin variable dose per unstable renal function (pharmacist dosing)   Does not apply See admin instructions   insulin aspart  0-15 Units Subcutaneous TID WC   insulin aspart  0-5 Units Subcutaneous QHS   metoprolol tartrate  25 mg Oral BID   nystatin  5 mL Oral QID   potassium chloride  40 mEq Oral Once   rifampin  300 mg Oral Q8H   sodium chloride flush  10-40 mL Intracatheter Q12H   Continuous Infusions:  dextrose 5 % and 0.9% NaCl 75 mL/hr at 05/26/21 1000   lactated ringers     nafcillin (NAFCIL) continuous infusion 12 g (05/25/21 1326)   PRN Meds:.acetaminophen **OR** acetaminophen, ipratropium-albuterol, melatonin, ondansetron **OR** ondansetron (ZOFRAN) IV, sodium chloride flush   I have personally reviewed following labs and imaging studies  LABORATORY DATA: CBC: Recent Labs  Lab 05/20/21 0949 05/21/21 0621 05/22/21 0346 05/23/21 0331 05/25/21 0421  WBC 8.6 10.2 9.0 9.7 9.4  HGB 14.6 15.6 15.2 15.2 13.9  HCT 44.2 47.2 45.7 46.5 43.4  MCV 93.2 92.0 92.3 92.8 94.6  PLT 238 251 246 235 265     Basic Metabolic Panel: Recent Labs  Lab 05/21/21 0621 05/22/21 0346 05/23/21 0331 05/24/21 0227 05/25/21 0421 05/25/21 1159 05/26/21 0320  NA 136 132* 136 136 137  --  139  K 3.5 3.3* 3.0* 3.2* 3.0*  --  2.5*  CL 99 98 101 104 104  --  106  CO2 28 25 25  21* 23  --  23  GLUCOSE 120* 110* 100* 115* 125*  --  204*  BUN <5* <5* <5* 7* 11  --   6*  CREATININE 0.95 0.79 1.00 1.37* 1.75* 1.58* 1.40*  CALCIUM 8.6* 8.4* 8.4* 8.1* 7.9*  --  7.4*  MG 1.7  --  1.6* 2.3  --   --  1.5*     GFR: Estimated  Creatinine Clearance: 49.3 mL/min (A) (by C-G formula based on SCr of 1.4 mg/dL (H)).  Liver Function Tests: Recent Labs  Lab 05/23/21 0331  AST 17  ALT 10  ALKPHOS 102  BILITOT 2.2*  PROT 5.8*  ALBUMIN 2.5*    No results for input(s): LIPASE, AMYLASE in the last 168 hours. No results for input(s): AMMONIA in the last 168 hours.  Coagulation Profile: No results for input(s): INR, PROTIME in the last 168 hours.  Cardiac Enzymes: No results for input(s): CKTOTAL, CKMB, CKMBINDEX, TROPONINI in the last 168 hours.  BNP (last 3 results) No results for input(s): PROBNP in the last 8760 hours.  Lipid Profile: No results for input(s): CHOL, HDL, LDLCALC, TRIG, CHOLHDL, LDLDIRECT in the last 72 hours.  Thyroid Function Tests: No results for input(s): TSH, T4TOTAL, FREET4, T3FREE, THYROIDAB in the last 72 hours.  Anemia Panel: No results for input(s): VITAMINB12, FOLATE, FERRITIN, TIBC, IRON, RETICCTPCT in the last 72 hours.  Urine analysis:    Component Value Date/Time   COLORURINE YELLOW 05/13/2021 1654   APPEARANCEUR HAZY (A) 05/13/2021 1654   LABSPEC 1.018 05/13/2021 1654   PHURINE 6.0 05/13/2021 1654   GLUCOSEU NEGATIVE 05/13/2021 1654   HGBUR SMALL (A) 05/13/2021 1654   BILIRUBINUR NEGATIVE 05/13/2021 1654   KETONESUR 20 (A) 05/13/2021 1654   PROTEINUR 30 (A) 05/13/2021 1654   NITRITE NEGATIVE 05/13/2021 1654   LEUKOCYTESUR NEGATIVE 05/13/2021 1654    Sepsis Labs: Lactic Acid, Venous    Component Value Date/Time   LATICACIDVEN 1.4 05/13/2021 2025    MICROBIOLOGY: Recent Results (from the past 240 hour(s))  Culture, blood (routine x 2)     Status: None   Collection Time: 05/20/21 12:02 PM   Specimen: BLOOD  Result Value Ref Range Status   Specimen Description BLOOD RIGHT ANTECUBITAL  Final    Special Requests   Final    BOTTLES DRAWN AEROBIC AND ANAEROBIC Blood Culture adequate volume   Culture   Final    NO GROWTH 5 DAYS Performed at [facility], [place] [address], [place], [place] [address]    Report Status 05/25/2021 FINAL  Final  Culture, blood (routine x 2)     Status: None   Collection Time: 05/20/21 12:02 PM   Specimen: BLOOD  Result Value Ref Range Status   Specimen Description BLOOD RIGHT ANTECUBITAL  Final   Special Requests   Final    BOTTLES DRAWN AEROBIC AND ANAEROBIC Blood Culture adequate volume   Culture   Final    NO GROWTH 5 DAYS Performed at [facility], [place] [address] [place], [place] [address]    Report Status 05/25/2021 FINAL  Final  Culture, blood (routine x 2)     Status: None (Preliminary result)   Collection Time: 05/22/21  2:46 PM   Specimen: BLOOD  Result Value Ref Range Status   Specimen Description BLOOD RIGHT ANTECUBITAL  Final   Special Requests   Final    BOTTLES DRAWN AEROBIC AND ANAEROBIC Blood Culture adequate volume   Culture   Final    NO GROWTH 4 DAYS Performed at [facility], [place] [address], [place], [place] [address]    Report Status PENDING  Incomplete  Culture, blood (routine x 2)     Status: None (Preliminary result)   Collection Time: 05/22/21  2:50 PM   Specimen: BLOOD RIGHT WRIST  Result Value Ref Range Status   Specimen Description BLOOD RIGHT WRIST  Final  Special Requests   Final    BOTTLES DRAWN AEROBIC AND ANAEROBIC Blood Culture adequate volume   Culture   Final    NO GROWTH 4 DAYS Performed at [facility], [place] [address], [place], [place] [address]    Report Status PENDING  Incomplete    RADIOLOGY STUDIES/RESULTS: No results found.   LOS: 13 days   Oren Binet, MD  [facility]    To contact the attending provider between 7A-7P or the covering provider during after hours 7P-7A, please log into the web site [email or web address] and access using universal [place]  password for that web site. If you do not have the password, please call the hospital operator.  05/26/2021, 1:46 PM

## 2021-05-27 DIAGNOSIS — I471 Supraventricular tachycardia: Secondary | ICD-10-CM | POA: Diagnosis not present

## 2021-05-27 DIAGNOSIS — Z952 Presence of prosthetic heart valve: Secondary | ICD-10-CM | POA: Diagnosis not present

## 2021-05-27 DIAGNOSIS — Z7189 Other specified counseling: Secondary | ICD-10-CM | POA: Diagnosis not present

## 2021-05-27 DIAGNOSIS — A419 Sepsis, unspecified organism: Secondary | ICD-10-CM | POA: Diagnosis not present

## 2021-05-27 LAB — CULTURE, BLOOD (ROUTINE X 2)
Culture: NO GROWTH
Culture: NO GROWTH
Special Requests: ADEQUATE
Special Requests: ADEQUATE

## 2021-05-27 MED ORDER — SODIUM CHLORIDE 0.9 % IV SOLN
12.5000 mg | Freq: Four times a day (QID) | INTRAVENOUS | Status: DC | PRN
  Administered 2021-05-27: 12.5 mg via INTRAVENOUS
  Filled 2021-05-27: qty 0.5

## 2021-05-27 NOTE — Progress Notes (Signed)
C/o nausea and episode of vomiting. Had PRN zofran earlier, but said it did not work. Contacted Dr Marlowe Sax via text page and message.

## 2021-05-27 NOTE — Progress Notes (Signed)
Needs to have                        PROGRESS NOTE        PATIENT DETAILS Name: Michael Hines Age: 75 y.o. Sex: male Date of Birth: 1946-08-06 Admit Date: 05/13/2021 Admitting Physician Kristopher Oppenheim, DO WCB:[id number], [name], MD  Brief Narrative: Patient is a 75 y.o. male with history of aortic valve replacement (bioprosthetic valve)-recent hospitalization (9/2-9/9) for MSSA bacteremia (TEE negative for endocarditis on 9/8)-completed IV Ancef x4 weeks.  Patient was referred to the ED from ID clinic on 10/3 for evaluation of encephalopathy/hypothermia.  Patient was thought to have SIRS-however blood cultures was negative-further hospital course was complicated by development of SVT requiring IV amiodarone and cardiology consultation--eventually patient underwent TEE on 10/12-which showed mitral valve vegetation.  Patient was started on IV antibiotics-ID followed patient closely.  Unfortunately-patient continued to have severe failure to thrive syndrome-and very poor oral intake, palliative care was subsequently involved-we will discuss with family extensively-patient was then transitioned to full comfort measures on 10/17.  Awaiting residential hospice bed.    Subjective: Awake-does not have an appetite-confused at times.  Per spouse at bedside-has not had a significant oral intake for almost a week-only has a few sips/bites here and there.    Objective: Vitals: Blood pressure (!) 149/86, pulse 79, temperature 98.9 F (37.2 C), temperature source Oral, resp. rate 20, height 5\' 7"  (1.702 m), weight 86.4 kg, SpO2 93 %.   Exam: Gen Exam: Lying comfortably in bed-not in any distress. HEENT:atraumatic, normocephalic Chest: B/L clear to auscultation anteriorly CVS:S1S2 regular Abdomen:soft non tender, non distended Extremities:no edema Neurology: Non focal-severe generalized weakness. Skin: no rash   Pertinent Labs/Radiology: 10/03: Blood culture: No growth 10/10>>Blood culture: No  growth 10/12>> blood culture: No growth  10/07: EEG: No seizures. 10/07: CTA chest: No PE 10/08: MRI brain>> acute cortical infarct versus artifact. 10/08:>> CT angio head and neck: No high-grade stenosis. 10/12>> TEE: Endocarditis involving mitral valve, diffuse thickening of bioprosthetic aortic valve leaflets.   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  ID followed closely-he was started on nafcillin/rifampicin and gentamicin-gentamicin was subsequently discontinued as he developed AKI and was found to have supratherapeutic levels.  He has now been transitioned to comfort measures-he is off all antimicrobial therapy.   SVT: Maintaining sinus rhythm-initially on IV amiodarone/metoprolol-t this was subsequently switched to oral route-and now has been discontinued as he has been transitioned to full comfort measures.  History of aortic valve replacement with bioprosthetic valve  ?  Acute CVA (septic emboli) versus artifact: Reviewed neurology note-thought to be a artifact rather than a acute CVA-however study was limited due to motion artifact.  History of vascular parkinsonism: Prior MD-Dr. Dr. Tyrell Antonio- discussed with patient's primary neurologist-Sinemet on hold.  Oral thrush: On nystatin  AKI: Multifactorial-due to poor oral intake and possible gentamicin toxicity.  Gentamicin was held-no further blood work planned as patient has been transitioned to full comfort  measures.  Hypokalemia: Repleted-no need to monitor as comfort measures in effect.  Hypomagnesemia:  Repleted-no need to monitor as comfort measures in effect.  Hyponatremia: Resolved  HTN: BP stable-no longer on antihypertensives.  Comfort measures in place  HLD: No longer on statin-comfort measures in place  DM-2 (A1c 7.9 on 9/2): No longer on SSI-comfort measures in place.  Recent Labs    05/26/21 1236 05/26/21 1654 05/26/21 2033  GLUCAP 99 110* 94      COPD: Not in exacerbation-continue bronchodilators  Ascending thoracic aneurysm 4.8 cm: Radiology recommending semiannual imaging by CTA or MRA.  Failure to thrive syndrome/frailty/palliative care/goals of care: Continues to slowly deteriorate-very weak/debilitated-no significant oral intake for almost 7 days.  Extensive discussion held with family by this MD and also with the palliative care team-given continued deterioration/severe failure to thrive syndrome-he was subsequently transitioned to full comfort measures 10/17, awaiting residential hospice bed.   Procedures: None Consults: None DVT Prophylaxis: Lovenox Code Status:Full code Family Communication: Spouse at bedside   Time spent: 51 minutes-Greater than 50% of this time was spent in counseling, explanation of diagnosis, planning of further management, and coordination of care.  Diet: Diet Order             Diet Heart Room service appropriate? Yes; Fluid consistency: Thin  Diet effective now                      Disposition Plan: Status is: Inpatient  Remains inpatient appropriate because:Inpatient level of care appropriate due to severity of illness  Dispo: The patient is from: SNF              Anticipated d/c is to: SNF              Patient currently is not medically stable to d/c.   Difficult to place patient No   Barriers to Discharge: Ongoing/lingering encephalopathy-mitral valve endocarditis-restarting IV antibiotics.  Antimicrobial  agents: Anti-infectives (From admission, onward)    Start     Dose/Rate Route Frequency Ordered Stop   05/25/21 1503  gentamicin variable dose per unstable renal function (pharmacist dosing)  Status:  Discontinued         Does not apply See admin instructions 05/25/21 1504 05/26/21 2038   05/23/21 1430  rifampin (RIFADIN) capsule 300 mg  Status:  Discontinued        300 mg Oral Every 8 hours 05/23/21 1332 05/26/21 2038   05/23/21 1145  nafcillin 12 g in sodium chloride 0.9 % 500 mL continuous infusion  Status:  Discontinued        12 g 20.8 mL/hr over 24 Hours Intravenous Every 24 hours 05/23/21 1045 05/26/21 2038   05/22/21 1800  ceFAZolin (ANCEF) IVPB 2g/100 mL premix  Status:  Discontinued        2 g 200 mL/hr over 30 Minutes Intravenous Every 8 hours 05/22/21 1439 05/22/21 1447   05/22/21 1700  gentamicin (GARAMYCIN) IVPB 80 mg  Status:  Discontinued        80 mg 100 mL/hr over 30 Minutes Intravenous Every 8 hours 05/22/21 1507 05/24/21 0949   05/22/21 1600  nafcillin 2 g in sodium chloride 0.9 % 100 mL IVPB  Status:  Discontinued        2 g 200 mL/hr over 30 Minutes Intravenous Every 4 hours 05/22/21 1507 05/23/21 1045   05/22/21 1600  rifampin (RIFADIN) 300 mg in sodium chloride 0.9 % 100 mL IVPB  Status:  Discontinued        300 mg 200 mL/hr over 30 Minutes Intravenous Every  8 hours 05/22/21 1507 05/23/21 1334   05/14/21 2000  vancomycin (VANCOREADY) IVPB 750 mg/150 mL  Status:  Discontinued        750 mg 150 mL/hr over 60 Minutes Intravenous Every 24 hours 05/13/21 1902 05/14/21 1011   05/14/21 1900  vancomycin (VANCOREADY) IVPB 1500 mg/300 mL  Status:  Discontinued        1,500 mg 150 mL/hr over 120 Minutes Intravenous Every 24 hours 05/14/21 1011 05/14/21 1418   05/14/21 1400  ceFEPIme (MAXIPIME) 2 g in sodium chloride 0.9 % 100 mL IVPB  Status:  Discontinued        2 g 200 mL/hr over 30 Minutes Intravenous Every 8 hours 05/14/21 1000 05/15/21 0947   05/14/21 0600  ceFEPIme  (MAXIPIME) 2 g in sodium chloride 0.9 % 100 mL IVPB  Status:  Discontinued        2 g 200 mL/hr over 30 Minutes Intravenous Every 12 hours 05/14/21 0201 05/14/21 1000   05/14/21 0100  ceFEPIme (MAXIPIME) 2 g in sodium chloride 0.9 % 100 mL IVPB  Status:  Discontinued        2 g 200 mL/hr over 30 Minutes Intravenous Every 8 hours 05/13/21 1710 05/14/21 0200   05/13/21 1700  ceFEPIme (MAXIPIME) 2 g in sodium chloride 0.9 % 100 mL IVPB        2 g 200 mL/hr over 30 Minutes Intravenous  Once 05/13/21 1654 05/13/21 1903   05/13/21 1700  vancomycin (VANCOREADY) IVPB 1750 mg/350 mL        1,750 mg 175 mL/hr over 120 Minutes Intravenous  Once 05/13/21 1654 05/13/21 2103        MEDICATIONS: Scheduled Meds:  clotrimazole   Topical BID   nystatin  5 mL Oral QID   sodium chloride flush  10-40 mL Intracatheter Q12H   Continuous Infusions:   PRN Meds:.acetaminophen **OR** acetaminophen, antiseptic oral rinse, fluticasone furoate-vilanterol, glycopyrrolate, haloperidol lactate, ipratropium-albuterol, LORazepam, melatonin, morphine injection, ondansetron **OR** ondansetron (ZOFRAN) IV, polyvinyl alcohol, sodium chloride flush   I have personally reviewed following labs and imaging studies  LABORATORY DATA: CBC: Recent Labs  Lab 05/21/21 0621 05/22/21 0346 05/23/21 0331 05/25/21 0421  WBC 10.2 9.0 9.7 9.4  HGB 15.6 15.2 15.2 13.9  HCT 47.2 45.7 46.5 43.4  MCV 92.0 92.3 92.8 94.6  PLT 251 246 235 265     Basic Metabolic Panel: Recent Labs  Lab 05/21/21 0621 05/22/21 0346 05/23/21 0331 05/24/21 0227 05/25/21 0421 05/25/21 1159 05/26/21 0320  NA 136 132* 136 136 137  --  139  K 3.5 3.3* 3.0* 3.2* 3.0*  --  2.5*  CL 99 98 101 104 104  --  106  CO2 28 25 25  21* 23  --  23  GLUCOSE 120* 110* 100* 115* 125*  --  204*  BUN <5* <5* <5* 7* 11  --  6*  CREATININE 0.95 0.79 1.00 1.37* 1.75* 1.58* 1.40*  CALCIUM 8.6* 8.4* 8.4* 8.1* 7.9*  --  7.4*  MG 1.7  --  1.6* 2.3  --   --  1.5*      GFR: Estimated Creatinine Clearance: 49.3 mL/min (A) (by C-G formula based on SCr of 1.4 mg/dL (H)).  Liver Function Tests: Recent Labs  Lab 05/23/21 0331  AST 17  ALT 10  ALKPHOS 102  BILITOT 2.2*  PROT 5.8*  ALBUMIN 2.5*    No results for input(s): LIPASE, AMYLASE in the last 168 hours. No results for input(s): AMMONIA in  the last 168 hours.  Coagulation Profile: No results for input(s): INR, PROTIME in the last 168 hours.  Cardiac Enzymes: No results for input(s): CKTOTAL, CKMB, CKMBINDEX, TROPONINI in the last 168 hours.  BNP (last 3 results) No results for input(s): PROBNP in the last 8760 hours.  Lipid Profile: No results for input(s): CHOL, HDL, LDLCALC, TRIG, CHOLHDL, LDLDIRECT in the last 72 hours.  Thyroid Function Tests: No results for input(s): TSH, T4TOTAL, FREET4, T3FREE, THYROIDAB in the last 72 hours.  Anemia Panel: No results for input(s): VITAMINB12, FOLATE, FERRITIN, TIBC, IRON, RETICCTPCT in the last 72 hours.  Urine analysis:    Component Value Date/Time   COLORURINE YELLOW 05/13/2021 1654   APPEARANCEUR HAZY (A) 05/13/2021 1654   LABSPEC 1.018 05/13/2021 1654   PHURINE 6.0 05/13/2021 1654   GLUCOSEU NEGATIVE 05/13/2021 1654   HGBUR SMALL (A) 05/13/2021 1654   BILIRUBINUR NEGATIVE 05/13/2021 1654   KETONESUR 20 (A) 05/13/2021 1654   PROTEINUR 30 (A) 05/13/2021 1654   NITRITE NEGATIVE 05/13/2021 1654   LEUKOCYTESUR NEGATIVE 05/13/2021 1654    Sepsis Labs: Lactic Acid, Venous    Component Value Date/Time   LATICACIDVEN 1.4 05/13/2021 2025    MICROBIOLOGY: Recent Results (from the past 240 hour(s))  Culture, blood (routine x 2)     Status: None   Collection Time: 05/20/21 12:02 PM   Specimen: BLOOD  Result Value Ref Range Status   Specimen Description BLOOD RIGHT ANTECUBITAL  Final   Special Requests   Final    BOTTLES DRAWN AEROBIC AND ANAEROBIC Blood Culture adequate volume   Culture   Final    NO GROWTH 5  DAYS Performed at [facility], [place] [address], [place], [place] [address]    Report Status 05/25/2021 FINAL  Final  Culture, blood (routine x 2)     Status: None   Collection Time: 05/20/21 12:02 PM   Specimen: BLOOD  Result Value Ref Range Status   Specimen Description BLOOD RIGHT ANTECUBITAL  Final   Special Requests   Final    BOTTLES DRAWN AEROBIC AND ANAEROBIC Blood Culture adequate volume   Culture   Final    NO GROWTH 5 DAYS Performed at [facility], [place] [address], [place], [place] [address]    Report Status 05/25/2021 FINAL  Final  Culture, blood (routine x 2)     Status: None   Collection Time: 05/22/21  2:46 PM   Specimen: BLOOD  Result Value Ref Range Status   Specimen Description BLOOD RIGHT ANTECUBITAL  Final   Special Requests   Final    BOTTLES DRAWN AEROBIC AND ANAEROBIC Blood Culture adequate volume   Culture   Final    NO GROWTH 5 DAYS Performed at [facility], [place] [address], [place], [place] [address]    Report Status 05/27/2021 FINAL  Final  Culture, blood (routine x 2)     Status: None   Collection Time: 05/22/21  2:50 PM   Specimen: BLOOD RIGHT WRIST  Result Value Ref Range Status   Specimen Description BLOOD RIGHT WRIST  Final   Special Requests   Final    BOTTLES DRAWN AEROBIC AND ANAEROBIC Blood Culture adequate volume   Culture   Final    NO GROWTH 5 DAYS Performed at [facility], [place] [address], [place], [place] [address]    Report Status 05/27/2021 FINAL  Final    RADIOLOGY STUDIES/RESULTS: No results found.   LOS: 14 days  Oren Binet, MD  [facility]    To contact the attending provider between 7A-7P or the covering provider during after hours 7P-7A, please log into the web site [email or web address] and access using universal [place] password for that web site. If you do not have the password, please call the hospital operator.  05/27/2021, 11:56 AM

## 2021-05-27 NOTE — Progress Notes (Signed)
Daily Progress Note   Patient Name: Michael Hines       Date: 05/27/2021 DOB: 05-12-1946  Age: 75 y.o. MRN#: [id number] Attending Physician: Jonetta Osgood, MD Primary Care Physician: Finis Bud, MD Admit Date: 05/13/2021  Reason for Consultation/Follow-up: Establishing goals of care  Patient Profile/HPI:   Patient is a 75 y.o. male with history of aortic valve replacement (bioprosthetic valve), vascular parkinsonism, -recent hospitalization (9/2-9/9) for MSSA bacteremia (TEE negative for endocarditis on 9/8)-completed IV Ancef x4 weeks.  Patient was referred to the ED from ID clinic on 10/3 for evaluation of encephalopathy/hypothermia.  Patient was thought to have SIRS-however blood cultures was negative-further hospital course was complicated by development of SVT requiring IV amiodarone and cardiology consultation--eventually patient underwent TEE on 10/12-which showed mitral valve vegetation. Recovery has been complicated by ongoing encepholopathy, possibly septic emboli. He is not a candidate for surgical intervention for valve vegetation.     Subjective: Patient awake, does not feel well. No pain, has nausea. Not eating or drinking. Spouse at bedside.  Son called last night and patient was transitioned to full comfort measures only, hospice bed has bene requested.   Review of Systems  Gastrointestinal:  Positive for nausea.    Physical Exam Vitals and nursing note reviewed.  Constitutional:      Appearance: He is ill-appearing.  Cardiovascular:     Rate and Rhythm: Normal rate.  Pulmonary:     Effort: Pulmonary effort is normal.  Neurological:     Mental Status: He is disoriented.            Vital Signs: BP (!) 149/86 (BP Location: Right Arm)   Pulse 79   Temp  98.9 F (37.2 C) (Oral)   Resp 20   Ht 5\' 7"  (1.702 m)   Wt 86.4 kg   SpO2 93%   BMI 29.83 kg/m  SpO2: SpO2: 93 % O2 Device: O2 Device: Room Air O2 Flow Rate: O2 Flow Rate (L/min): 1 L/min  Intake/output summary:  Intake/Output Summary (Last 24 hours) at 05/27/2021 0937 Last data filed at 05/26/2021 1823 Gross per 24 hour  Intake 5863.11 ml  Output 900 ml  Net 4963.11 ml   LBM: Last BM Date: 05/26/21 Baseline Weight: Weight: 90.7 kg Most recent weight: Weight: 86.4 kg  Palliative Assessment/Data: PPS: 10%      Patient Active Problem List   Diagnosis Date Noted  . Prosthetic valve endocarditis ([place])   . 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 SVT (supraventricular tachycardia) ([place])   . Tachycardia 05/13/2021  . Tachypnea 05/13/2021  . Hypotension 05/13/2021  . Hypothermia 05/13/2021  . Sepsis with acute organ dysfunction ([place]) 05/13/2021  . AKI (acute kidney injury) ([place]) 05/13/2021  . Sepsis with acute organ dysfunction without septic shock ([place]) 05/13/2021  . Acute respiratory failure with hypoxia ([place])   . S/P AVR   . Parkinson's disease ([place])   . Diabetic polyneuropathy associated with type 2 diabetes mellitus ([place])   . Chronic bilateral low back pain without sciatica   . COPD exacerbation ([place]) 04/12/2021  . Thoracic aortic aneurysm without rupture 09/14/2019  . Delirium 09/09/2018  . Hypokalemia 09/09/2018  . MSSA bacteremia   . Diabetes mellitus without complication ([place]) 46/27/0350  . Polycythemia 09/01/2018  . Abnormal LFTs 09/01/2018  . Hypophosphatemia 09/01/2018  . Altered mental status 09/01/2018    Palliative Care Assessment & Plan    Assessment/Recommendations/Plan  Continue comfort medications and interventions as ordered Requested RN to give ondasetron as ordered Awaiting residential hospice evaluation and placement   Code Status: DNR  Prognosis:  < 2 weeks  Discharge Planning: Hospice facility pending bed  availability  Care plan was discussed with patient's spouse.  Thank you for allowing the Palliative Medicine Team to assist in the care of this patient.  Total time: 26 mins  Prolonged billing: No     Greater than 50%  of this time was spent counseling and coordinating care related to the above assessment and plan.  Mariana Kaufman, AGNP-C Palliative Medicine   Please contact Palliative Medicine Team phone at [phone] for questions and concerns.

## 2021-05-27 NOTE — Progress Notes (Signed)
[place]) Hospital Liaison Note    Received request from Transitions of Care Manager Percell Locus Ryyan )for family interest in [facility]. Spoke with family wife Shauna Hugh and son Ronalee Belts to confirm interest and explain services. Eligibility confirmed. Unfortunately [facility] is not able to offer a room today. Hospice liaison will follow up tomorrow. Please do not hesitate to call with questions. Thank you.    Zollie Pee [facility] Liaison  [phone]

## 2021-05-27 NOTE — TOC Progression Note (Signed)
Transition of Care [facility]) - Progression Note    Patient Details  Name: Michael Hines MRN: [id number] Date of Birth: 1946-02-28  Transition of Care [facility]) CM/SW [place], LCSW Phone Number: 05/27/2021, 9:27 AM  Clinical Narrative:    CSW received consult for residential hospice facility placement. CSW spoke with patient's son/HCPOA, Michael Hines. He confirmed request for [facility] location. CSW sent referral to [place] at [facility] for review.    Expected Discharge Plan: [place] Barriers to Discharge: Hospice Bed not available  Expected Discharge Plan and Services Expected Discharge Plan: [place] In-house Referral: Clinical Social Work   Post Acute Care Choice: Hospice Living arrangements for the past 2 months: [place], Single Family Home                                       Social Determinants of Health (SDOH) Interventions    Readmission Risk Interventions No flowsheet data found.

## 2021-05-27 NOTE — Progress Notes (Signed)
ID Brief Note  Chart reviewed, patient has been transition to comfort care.  ID will sign off. Please call with questions.  Rosiland Oz, MD Infectious Disease Physician [facility] [address] [place], [place] [address] Phone: [phone]  Fax: [phone]

## 2021-05-28 DIAGNOSIS — A021 Salmonella sepsis: Secondary | ICD-10-CM | POA: Diagnosis not present

## 2021-05-28 DIAGNOSIS — I76 Septic arterial embolism: Secondary | ICD-10-CM | POA: Diagnosis not present

## 2021-05-28 DIAGNOSIS — N179 Acute kidney failure, unspecified: Secondary | ICD-10-CM | POA: Diagnosis not present

## 2021-05-28 DIAGNOSIS — E119 Type 2 diabetes mellitus without complications: Secondary | ICD-10-CM | POA: Diagnosis not present

## 2021-05-28 MED ORDER — GLYCOPYRROLATE 0.2 MG/ML IJ SOLN: 0.3000 mg | INTRAMUSCULAR | Status: AC | PRN

## 2021-05-28 MED ORDER — SODIUM CHLORIDE 0.9 % IV SOLN: 12.5000 mg | Freq: Four times a day (QID) | INTRAVENOUS | Status: AC | PRN

## 2021-05-28 MED ORDER — CLOTRIMAZOLE 1 % EX CREA: TOPICAL_CREAM | Freq: Two times a day (BID) | CUTANEOUS | 0 refills | Status: AC

## 2021-05-28 MED ORDER — LORAZEPAM 2 MG/ML IJ SOLN: 1.0000 mg | INTRAMUSCULAR | Status: DC | PRN

## 2021-05-28 MED ORDER — LORAZEPAM 2 MG/ML IJ SOLN: 1.0000 mg | INTRAMUSCULAR | 0 refills | Status: AC | PRN

## 2021-05-28 MED ORDER — HALOPERIDOL LACTATE 5 MG/ML IJ SOLN: 0.5000 mg | INTRAMUSCULAR | Status: AC | PRN

## 2021-05-28 MED ORDER — MORPHINE SULFATE (PF) 2 MG/ML IV SOLN: 1.0000 mg | INTRAVENOUS | 0 refills | Status: AC | PRN

## 2021-05-28 NOTE — Progress Notes (Signed)
1300: Report given to Helene Kelp, RN at the [facility], SBAR provided, no questions or concerns at this time.

## 2021-05-28 NOTE — TOC Transition Note (Signed)
Transition of Care [facility]) - CM/SW Discharge Note   Patient Details  Name: Michael Hines MRN: [id number] Date of Birth: 1945-11-27  Transition of Care [facility]) CM/SW Contact:  Benard Halsted, LCSW Phone Number: 05/28/2021, 1:49 PM   Clinical Narrative:    Patient will DC to: [place] Anticipated DC date: 05/28/21 Family notified: Spouse Transport by: [facility] to contact transport once consents are signed.    Per MD patient ready for DC to [facility]. RN to call report prior to discharge [phone]). RN, patient, patient's family, and facility notified of DC. Discharge Summary sent to facility. DC packet on chart.   CSW will sign off for now as social work intervention is no longer needed. Please consult [place] again if new needs arise.     Final next level of care: [place] Barriers to Discharge: Barriers Resolved   Patient Goals and CMS Choice Patient states their goals for this hospitalization and ongoing recovery are:: Comfort CMS Medicare.gov Compare Post Acute Care list provided to:: Patient Represenative (must comment) Choice offered to / list presented to : Adult Children  Discharge Placement                Patient to be transferred to facility by: [place] Name of family member notified: Son, Wife Patient and family notified of of transfer: 05/28/21  Discharge Plan and Services In-house Referral: Clinical Social Work   Post Acute Care Choice: Hospice                               Social Determinants of Health (SDOH) Interventions     Readmission Risk Interventions No flowsheet data found.

## 2021-05-28 NOTE — Progress Notes (Signed)
VAST RN responded to patient's room to change dressing on midline catheter. Noted patient to be comfort care only. Called and spoke with patient's nurse, Lonn Georgia. Midline is being used for comfort measures. Plan is for Kayla to place IVT consult later in shift if patient is not being discharged to [facility] and dressing needs to be changed.

## 2021-05-28 NOTE — Progress Notes (Signed)
[place] [facility]) Hospital Liaison Note  [facility] is able to offer a bed today and patient and wife have accepted. Eligibility has been confirmed.   Please send completed and signed DNR with patient at time of discharge.  RN please call report to [phone].  Please call with any questions.  Thank you, Margaretmary Eddy, BSN, RN [facility] Liaison [phone]

## 2021-05-28 NOTE — Discharge Summary (Signed)
PATIENT DETAILS Name: Michael Hines Age: 75 y.o. Sex: male Date of Birth: [birth year] MRN: [id number]. Admitting Physician: Carollee Herter, DO DZE:SFAEIY, Harl Bowie, MD  Admit Date: 05/13/2021 Discharge date: 05/28/2021  Recommendations for Outpatient Follow-up:  Optimize comfort measures.  Admitted From:  SNF  Disposition: Residential Hospice    Home Health: No  Equipment/Devices: None  Discharge Condition: Poor  CODE STATUS: DNR  Diet recommendation:  Diet Order             Diet - low sodium heart healthy           Diet Heart Room service appropriate? Yes; Fluid consistency: Thin  Diet effective now                    Brief Summary: Patient is a 75 y.o. male with history of aortic valve replacement (bioprosthetic valve)-recent hospitalization (9/2-9/9) for MSSA bacteremia (TEE negative for endocarditis on 9/8)-completed IV Ancef x4 weeks.  Patient was referred to the ED from ID clinic on 10/3 for evaluation of encephalopathy/hypothermia.  Patient was thought to have SIRS-however blood cultures was negative-further hospital course was complicated by development of SVT requiring IV amiodarone and cardiology consultation--eventually patient underwent TEE on 10/12-which showed mitral valve vegetation.  Patient was started on IV antibiotics-ID followed patient closely.  Unfortunately-patient continued to have severe failure to thrive syndrome-and very poor oral intake, palliative care was subsequently involved-we will discuss with family extensively-patient was then transitioned to full comfort measures on 10/17.  Pertinent Labs/Radiology: 10/03: Blood culture: No growth 10/10>>Blood culture: No growth 10/12>> blood culture: No growth   10/07: EEG: No seizures. 10/07: CTA chest: No PE 10/08: MRI brain>> acute cortical infarct versus artifact. 10/08:>> CT angio head and neck: No high-grade stenosis. 10/12>> TEE: Endocarditis involving mitral valve, diffuse  thickening of bioprosthetic aortic valve leaflets.  Brief Hospital Course: Acute metabolic encephalopathy: Due to endocarditis-and possibly a small septic emboli to brain-continues to have lingering encephalopathy and severe failure to thrive syndrome with very poor oral intake over the past several days.  Has been transitioned to full comfort measures-being discharged to residential hospice.   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  ID followed closely-he was started on nafcillin/rifampicin and gentamicin-gentamicin was subsequently discontinued as he developed AKI and was found to have supratherapeutic levels.  He has now been transitioned to comfort measures-he is off all antimicrobial therapy.    SVT: Maintaining sinus rhythm-initially on IV amiodarone/metoprolol-t this was subsequently switched to oral route-and now has been discontinued as he has been transitioned to full comfort measures.   History of aortic valve replacement with bioprosthetic valve   ?  Acute CVA (septic emboli) versus artifact: Reviewed neurology note-thought to be a artifact rather than a acute CVA-however study was limited due to motion artifact.   History of vascular parkinsonism: Prior MD-Dr. Dr. Sunnie Nielsen- discussed with patient's primary neurologist-Sinemet on hold.   Oral thrush: On nystatin   AKI: Multifactorial-due to poor oral intake and possible gentamicin toxicity.  Gentamicin was held-no further blood work planned as patient has been transitioned to full comfort measures.   Hypokalemia: Repleted-no need to monitor as comfort measures in effect.   Hypomagnesemia: Repleted-no need to monitor as comfort measures in effect.   Hyponatremia:  Resolved  HTN: BP stable-no longer on antihypertensives.  Comfort measures in place   HLD: No longer on statin-comfort measures in place   DM-2 (A1c 7.9 on 9/2): No longer on SSI-comfort measures in place.  Procedures 10/12>> TEE:  Discharge Diagnoses:  Principal Problem:   Sepsis with acute organ dysfunction ([place]) Active Problems:   Diabetes mellitus without complication ([place])   S/P AVR   Diabetic polyneuropathy associated with type 2 diabetes mellitus ([facility])   Chronic bilateral low back pain without sciatica   Hypotension   Hypothermia   AKI (acute kidney injury) ([place])   Sepsis with acute organ dysfunction without septic shock (H[facility])   SVT (supraventricular tachycardia) ([facility])   Prosthetic valve endocarditis ([place])   Cerebral septic emboli [facility])   Discharge Instructions:  Activity:  As tolerated   Discharge Instructions     Diet - low sodium heart healthy   Complete by: As directed    Increase activity slowly   Complete by: As directed       Allergies as of 05/28/2021   No Known Allergies      Medication List     STOP taking these medications    Biofreeze 4 % Gel Generic drug: Menthol (Topical Analgesic)   bisacodyl 10 MG suppository Commonly known as: DULCOLAX   blood glucose meter kit and supplies Kit   budesonide-formoterol 80-4.5 MCG/ACT inhaler Commonly known as: SYMBICORT   buPROPion 300 MG 24 hr tablet Commonly known as: WELLBUTRIN XL   carbidopa-levodopa 25-100 MG tablet Commonly known as: SINEMET IR   ceFAZolin  IVPB Commonly known as: ANCEF   cyanocobalamin 1000 MCG/ML injection Commonly known as: (VITAMIN B-12)   gabapentin 300 MG capsule Commonly known as: NEURONTIN   gabapentin 600 MG tablet Commonly known as: NEURONTIN   HYDROcodone-acetaminophen 10-325 MG tablet Commonly known as: NORCO   insulin aspart 100 UNIT/ML injection Commonly known as: novoLOG   ipratropium-albuterol 0.5-2.5 (3) MG/3ML Soln Commonly known  as: DUONEB   loperamide 2 MG capsule Commonly known as: IMODIUM   magnesium hydroxide 400 MG/5ML suspension Commonly known as: MILK OF MAGNESIA   NON FORMULARY   NON FORMULARY   NovoLOG FlexPen 100 UNIT/ML FlexPen Generic drug: insulin aspart   RA SALINE ENEMA RE       TAKE these medications    clotrimazole 1 % cream Commonly known as: LOTRIMIN Apply topically 2 (two) times daily.   glycopyrrolate 0.2 MG/ML injection Commonly known as: ROBINUL Inject 1.5 mLs (0.3 mg total) into the vein every 4 (four) hours as needed (excessive secretions).   haloperidol lactate 5 MG/ML injection Commonly known as: HALDOL Inject 0.1 mLs (0.5 mg total) into the vein every 4 (four) hours as needed (or delirium).   LORazepam 2 MG/ML injection Commonly known as: ATIVAN Inject 0.5 mLs (1 mg total) into the vein every 4 (four) hours as needed for anxiety.   morphine 2 MG/ML injection Inject 0.5-1 mLs (1-2 mg total) into the vein every hour as needed (or dyspnea).   sodium chloride 0.9 % SOLN 50 mL with promethazine 25 MG/ML SOLN 12.5 mg Inject 12.5 mg into the vein every 6 (six) hours as needed.        Follow-up Information     Finis Bud, MD Follow up.   Specialty: Family Medicine Why: As needed Contact information: [place] [address] [place]  [address] [phone]                No Known Allergies  Consultations: ID Cardiology Palliative care   Other Procedures/Studies: CT ANGIO HEAD W OR WO CONTRAST  Result Date: 05/18/2021 CLINICAL DATA:  Stroke/TIA, assess extracranial arteries; Stroke/TIA, assess intracranial arteries EXAM: CT ANGIOGRAPHY HEAD AND NECK TECHNIQUE: Multidetector CT imaging of the head and neck was performed using the standard protocol during bolus administration of intravenous contrast. Multiplanar CT image reconstructions and MIPs were obtained to evaluate the vascular anatomy. Carotid stenosis measurements (when  applicable) are obtained utilizing NASCET criteria, using the distal internal carotid diameter as the denominator. CONTRAST:  62mL OMNIPAQUE IOHEXOL 350 MG/ML SOLN COMPARISON:  None. FINDINGS: CT HEAD FINDINGS Brain: There is no mass, hemorrhage or extra-axial collection. There is generalized atrophy without lobar predilection. There is an old left cerebellar small vessel infarct. There is hypoattenuation of the periventricular white matter, most commonly indicating chronic ischemic microangiopathy. Skull: The visualized skull base, calvarium and extracranial soft tissues are normal. Sinuses/Orbits: No fluid levels or advanced mucosal thickening of the visualized paranasal sinuses. No mastoid or middle ear effusion. The orbits are normal. CTA NECK FINDINGS SKELETON: There is no bony spinal canal stenosis. No lytic or blastic lesion. OTHER NECK: Normal pharynx, larynx and major salivary glands. No cervical lymphadenopathy. Unremarkable thyroid gland. UPPER CHEST: No pneumothorax or pleural effusion. No nodules or masses. AORTIC ARCH: There is calcific atherosclerosis of the aortic arch. There is no aneurysm, dissection or hemodynamically significant stenosis of the visualized portion of the aorta. Conventional 3 vessel aortic branching pattern. The visualized proximal subclavian arteries are widely patent. RIGHT CAROTID SYSTEM: No dissection, occlusion or aneurysm. Mild atherosclerotic calcification at the carotid bifurcation without hemodynamically significant stenosis. LEFT CAROTID SYSTEM: No dissection, occlusion or aneurysm. Mild atherosclerotic calcification at the carotid bifurcation without hemodynamically significant stenosis. VERTEBRAL ARTERIES: Left dominant configuration. Both origins are clearly patent. There is no dissection, occlusion or flow-limiting stenosis to the skull base (V1-V3 segments). CTA HEAD FINDINGS CTA head images are degraded by motion. POSTERIOR CIRCULATION: --Vertebral arteries: Normal  V4 segments. --Inferior cerebellar arteries: Normal. --Basilar artery: Normal. --Superior cerebellar arteries: Normal. --Posterior cerebral arteries (PCA): Normal. ANTERIOR CIRCULATION: --Intracranial internal carotid arteries: Normal. --Anterior cerebral arteries (ACA): Normal. Both A1 segments are present. Patent anterior communicating artery (a-comm). --Middle cerebral arteries (MCA): Normal. VENOUS SINUSES: As permitted by contrast timing, patent. ANATOMIC VARIANTS: None Review of the MIP images confirms the above findings. IMPRESSION: 1. No emergent large vessel occlusion or high-grade stenosis of the head or neck. 2. 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 Electronically Signed   By: Ulyses Jarred M.D.   On: 05/18/2021 20:37   CT ANGIO NECK W OR WO CONTRAST  Result Date: 05/18/2021 CLINICAL DATA:  Stroke/TIA, assess extracranial arteries; Stroke/TIA, assess intracranial arteries EXAM: CT ANGIOGRAPHY HEAD AND NECK TECHNIQUE: Multidetector CT imaging of the head and neck was performed using the standard protocol during bolus administration of intravenous contrast. Multiplanar CT image reconstructions and MIPs were obtained to evaluate the vascular anatomy. Carotid stenosis measurements (when applicable) are obtained utilizing NASCET criteria, using the distal internal carotid diameter as the denominator. CONTRAST:  35mL OMNIPAQUE IOHEXOL 350 MG/ML SOLN COMPARISON:  None. FINDINGS: CT HEAD FINDINGS Brain: There is no mass, hemorrhage or extra-axial collection. There is generalized atrophy without lobar predilection. There is an old left cerebellar small vessel infarct. There is hypoattenuation of the periventricular white matter, most commonly indicating chronic ischemic microangiopathy. Skull: The visualized skull base, calvarium and extracranial soft tissues are normal. Sinuses/Orbits: No fluid levels or advanced mucosal thickening of the  visualized paranasal sinuses. No mastoid or middle ear effusion.  The orbits are normal. CTA NECK FINDINGS SKELETON: There is no bony spinal canal stenosis. No lytic or blastic lesion. OTHER NECK: Normal pharynx, larynx and major salivary glands. No cervical lymphadenopathy. Unremarkable thyroid gland. UPPER CHEST: No pneumothorax or pleural effusion. No nodules or masses. AORTIC ARCH: There is calcific atherosclerosis of the aortic arch. There is no aneurysm, dissection or hemodynamically significant stenosis of the visualized portion of the aorta. Conventional 3 vessel aortic branching pattern. The visualized proximal subclavian arteries are widely patent. RIGHT CAROTID SYSTEM: No dissection, occlusion or aneurysm. Mild atherosclerotic calcification at the carotid bifurcation without hemodynamically significant stenosis. LEFT CAROTID SYSTEM: No dissection, occlusion or aneurysm. Mild atherosclerotic calcification at the carotid bifurcation without hemodynamically significant stenosis. VERTEBRAL ARTERIES: Left dominant configuration. Both origins are clearly patent. There is no dissection, occlusion or flow-limiting stenosis to the skull base (V1-V3 segments). CTA HEAD FINDINGS CTA head images are degraded by motion. POSTERIOR CIRCULATION: --Vertebral arteries: Normal V4 segments. --Inferior cerebellar arteries: Normal. --Basilar artery: Normal. --Superior cerebellar arteries: Normal. --Posterior cerebral arteries (PCA): Normal. ANTERIOR CIRCULATION: --Intracranial internal carotid arteries: Normal. --Anterior cerebral arteries (ACA): Normal. Both A1 segments are present. Patent anterior communicating artery (a-comm). --Middle cerebral arteries (MCA): Normal. VENOUS SINUSES: As permitted by contrast timing, patent. ANATOMIC VARIANTS: None Review of the MIP images confirms the above findings. IMPRESSION: 1. No emergent large vessel occlusion or high-grade stenosis of the head or neck. 2. 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 Electronically Signed   By: Ulyses Jarred M.D.   On:  05/18/2021 20:37   CT Angio Chest Pulmonary Embolism (PE) W or WO Contrast  Result Date: 05/17/2021 CLINICAL DATA:  Shortness of breath EXAM: CT ANGIOGRAPHY CHEST WITH CONTRAST TECHNIQUE: Multidetector CT imaging of the chest was performed using the standard protocol during bolus administration of intravenous contrast. Multiplanar CT image reconstructions and MIPs were obtained to evaluate the vascular anatomy. CONTRAST:  130mL OMNIPAQUE IOHEXOL 350 MG/ML SOLN COMPARISON:  04/12/2021 FINDINGS: Cardiovascular: Satisfactory opacification of the pulmonary arteries to the segmental level. No evidence of pulmonary embolism. Normal heart size. No pericardial effusion. Prior CABG. Ascending thoracic aortic aneurysm measuring 4.8 cm in transverse diameter at the level of the main pulmonary artery. Thoracic aortic atherosclerosis. Mediastinum/Nodes: No enlarged mediastinal, hilar, or axillary lymph nodes. Thyroid gland, trachea, and esophagus demonstrate no significant findings. Lungs/Pleura: No focal consolidation, pleural effusion or pneumothorax. Mild dependent atelectasis bilaterally. Upper Abdomen: No acute abnormality. Musculoskeletal: No chest wall abnormality. No acute or significant osseous findings. Review of the MIP images confirms the above findings. IMPRESSION: 1. No evidence of a pulmonary embolus. 2. Ascending thoracic aortic aneurysm measuring 4.8 cm in transverse diameter at the level of the main pulmonary artery. Ascending thoracic aortic aneurysm. Recommend semi-annual imaging followup by CTA or MRA and referral to cardiothoracic surgery if not already obtained. This recommendation follows 2010 ACCF/AHA/AATS/ACR/ASA/SCA/SCAI/SIR/STS/SVM Guidelines for the Diagnosis and Management of Patients With Thoracic Aortic Disease. Circulation. 2010; 121: X448-J856. Aortic aneurysm NOS (ICD10-I71.9) Electronically Signed   By: Kathreen Devoid M.D.   On: 05/17/2021 18:21   MR BRAIN WO CONTRAST  Result Date:  05/18/2021 CLINICAL DATA:  Neuro deficit, acute, stroke suspected. EXAM: MRI HEAD WITHOUT CONTRAST TECHNIQUE: Multiplanar, multiecho pulse sequences of the brain and surrounding structures were obtained without intravenous contrast. COMPARISON:  Brain MRI 05/17/2021. Brain MRI 03/07/2020. Head CT 04/12/2021. FINDINGS: Brain: Intermittently motion degraded examination, limiting evaluation. Most notably, there is moderate to moderately  severe motion degradation of the axial T2 FLAIR sequence, moderate motion degradation of the axial T1 weighted sequence and severe motion degradation of the coronal T2 TSE sequence. Redemonstrated central predominant cerebral atrophy. Mild cerebellar atrophy is also present. Apparent punctate focus of cortical diffusion weighted hyperintensity within the anterior right frontal lobe, appreciated on the axial diffusion-weighted sequence only (series 5, image 82). This may reflect a punctate acute infarct or artifact. Mild multifocal T2 FLAIR hyperintense signal abnormality within the cerebral white matter and pons, nonspecific but compatible with chronic small vessel ischemic disease. Chronic lacunar infarct within the inferior left cerebellar hemisphere. No evidence of an intracranial mass. No chronic intracranial blood products. No extra-axial fluid collection. No midline shift. Vascular: Maintained flow voids within the proximal large arterial vessels. Skull and upper cervical spine: No focal suspicious marrow lesion. Sinuses/Orbits: Visualized orbits show no acute finding. Right lens replacement. No significant paranasal sinus disease at the imaged levels. Other: Trace fluid within the bilateral mastoid air cells. IMPRESSION: Intermittently motion degraded examination, as described and limiting evaluation. Punctate acute cortical infarct versus artifact within the anterior right frontal lobe. Mild chronic small vessel ischemic changes within the cerebral white matter and pons, similar  as compared to the brain MRI of 02/06/2020. 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 Central predominant cerebral atrophy. Mild cerebellar atrophy is also present. Trace fluid within the bilateral mastoid air cells. Electronically Signed   By: Kellie Simmering D.O.   On: 05/18/2021 17:35   MR BRAIN WO CONTRAST  Result Date: 05/17/2021 CLINICAL DATA:  Initial evaluation for acute delirium. EXAM: MRI HEAD WITHOUT CONTRAST TECHNIQUE: Multiplanar, multiecho pulse sequences of the brain and surrounding structures were obtained without intravenous contrast. COMPARISON:  From prior CT from 04/12/2021. FINDINGS: Brain: Examination is severely limited as the patient was unable to tolerate the full length of the exam. Axial and coronal DWI weighted sequences, with axial FLAIR and sagittal T1 weighted sequences only were performed. The provided images are severely degraded by motion artifact, particularly the FLAIR and T1 weighted sequence which are centrally nondiagnostic. Diffusion-weighted imaging demonstrates no definite evidence for acute or subacute infarct. Gray-white matter differentiation grossly maintained. Underlying atrophy noted. Associated ventriculomegaly grossly stable from prior. No visible mass effect or midline shift. No obvious mass lesion or extra-axial fluid collection. Vascular: Not well assessed on this limited exam. Skull and upper cervical spine: Not well assessed on this limited exam. Sinuses/Orbits: Not well assessed on this limited exam. Other: None. IMPRESSION: 1. Severely limited exam due to the patient's inability to tolerate the full length of the study and extensive motion artifact. No definite evidence for acute infarct. 2. The remainder of the examination is essentially nondiagnostic. Electronically Signed   By: Jeannine Boga M.D.   On: 05/17/2021 00:39   DG CHEST PORT 1 VIEW  Result Date: 05/17/2021 CLINICAL DATA:  Altered mental  status, hypoxia EXAM: PORTABLE CHEST 1 VIEW COMPARISON:  05/13/2021 chest radiograph. FINDINGS: Intact sternotomy wires. Stable cardiomediastinal silhouette with mild cardiomegaly and aortic valve prosthesis in place. No pneumothorax. No pleural effusion. No rib pain edema. No acute consolidative airspace disease. IMPRESSION: Mild cardiomegaly without overt pulmonary edema. No active pulmonary disease. Electronically Signed   By: Ilona Sorrel M.D.   On: 05/17/2021 16:23   DG Chest Port 1 View  Result Date: 05/13/2021 CLINICAL DATA:  Diaphoretic tachycardia EXAM: PORTABLE CHEST 1 VIEW COMPARISON:  04/12/2021 FINDINGS: Right upper extremity central venous catheter tip over the SVC.  Post sternotomy changes. Cardiomegaly without focal airspace disease, effusion or pneumothorax. Small calcified lung nodules on right. Aortic atherosclerosis IMPRESSION: Cardiomegaly without focal airspace disease Electronically Signed   By: Donavan Foil M.D.   On: 05/13/2021 17:40   EEG adult  Result Date: 05/17/2021 Lora Havens, MD     05/17/2021 11:23 AM Patient Name: Michael Hines MRN: [id number] Epilepsy Attending: Lora Havens Referring Physician/Provider: Dr Niel Hummer Date: 05/17/2021 Duration: 24.26 mins Patient history: 75 year old male with altered mental status.  EEG to evaluate for seizures. Level of alertness: Awake, asleep AEDs during EEG study: None Technical aspects: This EEG study was done with scalp electrodes positioned according to the 10-20 International system of electrode placement. Electrical activity was acquired at a sampling rate of $Remov'500Hz'KmTWhX$  and reviewed with a high frequency filter of $RemoveB'70Hz'QWUnfArE$  and a low frequency filter of $RemoveB'1Hz'HklIPNQz$ . EEG data were recorded continuously and digitally stored. Description: No clear posterior dominant rhythm was seen.  Sleep was characterized by sleep spindles (12 to 14 Hz), maximal frontocentral region.  EEG showed continuous generalized predominantly 5 to 7 Hz theta as  well as intermittent generalized 2 to 3 Hz delta slowing.  Hyperventilation and photic stimulation were not performed.   Patient was noted to "shaking on his right side" per EEG annotation at 1038 which was difficult to be on camera.  Concomitant EEG before, during and after the event did not show any EEG change to suggest seizure. ABNORMALITY - Continuous slow, generalized IMPRESSION: This study is suggestive of moderate diffuse encephalopathy, nonspecific to etiology.  No seizures or epileptiform discharges were seen throughout the recording. Patient was noted to "shaking on his right side" at 1038 without concomitant EEG change. This was most likely not an epileptic event. Lora Havens   ECHO TEE  Result Date: 05/22/2021    TRANSESOPHOGEAL ECHO REPORT   Patient Name:   Michael Hines Date of Exam: 05/22/2021 Medical Rec #:  [id number]         Height:       67.0 in Accession #:    [id number]        Weight:       185.8 lb Date of Birth:  07/30/1946         BSA:          1.960 m Patient Age:    3 years          BP:           144/72 mmHg Patient Gender: M                 HR:           77 bpm. Exam Location:  Inpatient Procedure: 3D Echo, Transesophageal Echo, Cardiac Doppler and Color Doppler Indications:     Bacteremia  History:         Patient has prior history of Echocardiogram examinations.                  Abnormal ECG, COPD, Aortic Valve Disease, Arrythmias:SVT,                  Signs/Symptoms:Hypotension; Risk Factors:Diabetes.                  Aortic Valve: 21 mm bioprosthetic valve is present in the                  aortic position.  Sonographer:     [place] Referring  Phys:  [id number] Corrin Parker Diagnosing Phys: Zoila Shutter MD PROCEDURE: After discussion of the risks and benefits of a TEE, an informed consent was obtained from the patient. The transesophogeal probe was passed without difficulty through the esophogus of the patient. Imaged were obtained with the patient in a supine  position. Sedation performed by different physician. The patient was monitored while under deep sedation. Anesthestetic sedation was provided intravenously by Anesthesiology: 183mg  of Propofol. The patient's vital signs; including heart rate, blood pressure, and oxygen saturation; remained stable throughout the procedure. The patient developed no complications during the procedure. IMPRESSIONS  1. Left ventricular ejection fraction, by estimation, is 60 to 65%. The left ventricle has normal function. There is mild left ventricular hypertrophy.  2. Right ventricular systolic function is mildly reduced. The right ventricular size is normal.  3. No left atrial/left atrial appendage thrombus was detected. The LAA emptying velocity was 70 cm/s.  4. Small mobile vegetation on the mitral valve chordal structure to supporting the anterior mitral leaflet.  5. The mitral valve is abnormal. There is moderate thickning of the anterior leaflet and chordae consistent with vegetation. Trivial mitral valve regurgitation.  6. Thickening, vegetation of the mitral valve anterior leaflet.  7. Valve leaflets appear thickened, possibly consistent with degeneration or vegetation. The aortic valve has been repaired/replaced. Aortic valve regurgitation is mild. Mild aortic valve stenosis. 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 Echo findings are consistent with vegetation of the aortic prosthesis. Aortic valve mean gradient measures 3.0 mmHg (off axis doppler).  8. Aortic dilatation noted. 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 There is mild (Grade II) atheroma plaque involving the ascending, transverse and descending aorta. 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 FINDINGS  Left Ventricle: Left ventricular ejection fraction, by estimation, is 60 to 65%. The left ventricle has normal function. The left ventricular  internal cavity size was normal in size. There is mild left ventricular hypertrophy. Right Ventricle: The right ventricular size is normal. No increase in right ventricular wall thickness. Right ventricular systolic function is mildly reduced. Left Atrium: Left atrial size was normal in size. No left atrial/left atrial appendage thrombus was detected. The LAA emptying velocity was 70 cm/s. Right Atrium: Right atrial size was normal in size. Pericardium: There is no evidence of pericardial effusion. Mitral Valve: The mitral valve is abnormal. There is moderate thickening of the anterior mitral valve leaflet(s). A small vegetation is seen on the anterior mitral leaflet. The MV vegetation measures 5 mm x 5 mm. Thickening, vegetation involving the anterior leaflet is noted. Trivial mitral valve regurgitation. Tricuspid Valve: The tricuspid valve is grossly normal. Tricuspid valve regurgitation is trivial. Aortic Valve: Valve leaflets appear thickened, possibly consistent with degeneration or vegetation. The aortic valve has been repaired/replaced. Aortic valve regurgitation is mild. Aortic regurgitation PHT measures 646 msec. Mild aortic stenosis is present. Aortic valve mean gradient measures 3.0 mmHg. Aortic valve peak gradient measures 5.3 mmHg. 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 Pulmonic Valve: The pulmonic valve was grossly normal. Pulmonic valve regurgitation is not visualized. Aorta: Aortic dilatation noted. 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 There is mild (Grade II) atheroma plaque involving the ascending, transverse and descending aorta. IAS/Shunts: No atrial level shunt detected by color flow Doppler.  AORTIC VALVE AV Vmax:      115.00 cm/s AV Vmean:  72.100 cm/s AV VTI:       0.159 m AV Peak Grad: 5.3 mmHg AV Mean Grad: 3.0 mmHg AI PHT:       646 msec Lyman Bishop MD Electronically signed by Lyman Bishop MD Signature Date/Time:  05/22/2021/2:41:39 PM    Final    VAS [place] LOWER EXTREMITY VENOUS (DVT)  Result Date: 05/15/2021  Lower Venous DVT Study Patient Name:  Michael Hines  Date of Exam:   05/15/2021 Medical Rec #: [id number]          Accession #:    [id number] Date of Birth: [birth year]          Patient Gender: M Patient Age:   34 years Exam Location:  [facility] Procedure:      VAS [place] LOWER EXTREMITY VENOUS (DVT) Referring Phys: Jerald Kief REGALADO --------------------------------------------------------------------------------  Indications: Elevated Ddimer.  Risk Factors: None identified. Limitations: Poor ultrasound/tissue interface and patient positioning, patient movement, poor patient cooperation. Comparison Study: No prior studies. Performing Technologist: Oliver Hum RVT  Examination Guidelines: A complete evaluation includes B-mode imaging, spectral Doppler, color Doppler, and power Doppler as needed of all accessible portions of each vessel. Bilateral testing is considered an integral part of a complete examination. Limited examinations for reoccurring indications may be performed as noted. The reflux portion of the exam is performed with the patient in reverse Trendelenburg.  +---------+---------------+---------+-----------+----------+--------------+ RIGHT    CompressibilityPhasicitySpontaneityPropertiesThrombus Aging +---------+---------------+---------+-----------+----------+--------------+ CFV      Full           Yes      Yes                                 +---------+---------------+---------+-----------+----------+--------------+ SFJ      Full                                                        +---------+---------------+---------+-----------+----------+--------------+ FV Prox  Full                                                        +---------+---------------+---------+-----------+----------+--------------+ FV Mid   Full                                                         +---------+---------------+---------+-----------+----------+--------------+ FV DistalFull                                                        +---------+---------------+---------+-----------+----------+--------------+ PFV      Full                                                        +---------+---------------+---------+-----------+----------+--------------+  POP      Full           Yes      Yes                                 +---------+---------------+---------+-----------+----------+--------------+ PTV      Full                                                        +---------+---------------+---------+-----------+----------+--------------+ PERO     Full                                                        +---------+---------------+---------+-----------+----------+--------------+   +---------+---------------+---------+-----------+----------+-------------------+ LEFT     CompressibilityPhasicitySpontaneityPropertiesThrombus Aging      +---------+---------------+---------+-----------+----------+-------------------+ CFV      Full           Yes      Yes                                      +---------+---------------+---------+-----------+----------+-------------------+ SFJ      Full                                                             +---------+---------------+---------+-----------+----------+-------------------+ FV Prox  Full                                                             +---------+---------------+---------+-----------+----------+-------------------+ FV Mid   Full                                                             +---------+---------------+---------+-----------+----------+-------------------+ FV DistalFull                                                             +---------+---------------+---------+-----------+----------+-------------------+ PFV      Full                                                              +---------+---------------+---------+-----------+----------+-------------------+ POP      Full  Yes      Yes                                      +---------+---------------+---------+-----------+----------+-------------------+ PTV      Full                                                             +---------+---------------+---------+-----------+----------+-------------------+ PERO                                                  Not well visualized +---------+---------------+---------+-----------+----------+-------------------+     Summary: RIGHT: - There is no evidence of deep vein thrombosis in the lower extremity. However, portions of this examination were limited- see technologist comments above.  - No cystic structure found in the popliteal fossa.  LEFT: - There is no evidence of deep vein thrombosis in the lower extremity. However, portions of this examination were limited- see technologist comments above.  - No cystic structure found in the popliteal fossa.  *See table(s) above for measurements and observations. Electronically signed by Harold Barban MD on 05/15/2021 at 4:52:01 PM.    Final    ECHOCARDIOGRAM LIMITED  Result Date: 05/17/2021    ECHOCARDIOGRAM LIMITED REPORT   Patient Name:   Michael Hines Date of Exam: 05/17/2021 Medical Rec #:  [id number]         Height:       67.0 in Accession #:    [id number]        Weight:       187.4 lb Date of Birth:  November 18, 1945         BSA:          1.967 m Patient Age:    34 years          BP:           122/78 mmHg Patient Gender: M                 HR:           68 bpm. Exam Location:  Inpatient Procedure: Limited Echo, Limited Color Doppler and Cardiac Doppler Indications:    dyspnea. status post aortic valve replacement.  History:        Patient has prior history of Echocardiogram examinations. COPD                 and sepsis. Parkinson's; Risk Factors:Diabetes and Dyslipidemia.                 Aortic  Valve: 21 mm bioprosthetic valve is present in the aortic                 position.  Sonographer:    Johny Chess RDCS Referring Phys: [id number] Reform  1. Left ventricular ejection fraction, by estimation, is 60 to 65%. The left ventricle has normal function. The left ventricle has no regional wall motion abnormalities. Left ventricular diastolic parameters are consistent with Grade I diastolic dysfunction (impaired relaxation).  2. Right ventricular systolic function is normal. The right ventricular size is  normal.  3. The mitral valve is normal in structure. No evidence of mitral valve regurgitation. No evidence of mitral stenosis.  4. The aortic valve has been repaired/replaced. Aortic valve regurgitation is not visualized. No aortic stenosis is present. 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 Aortic valve mean gradient measures 10.0 mmHg. Aortic valve  Vmax measures 2.20 m/s.  5. The inferior vena cava is normal in size with greater than 50% respiratory variability, suggesting right atrial pressure of 3 mmHg. Comparison(s): No significant change from prior study. Prior images reviewed side by side. FINDINGS  Left Ventricle: Left ventricular ejection fraction, by estimation, is 60 to 65%. The left ventricle has normal function. The left ventricle has no regional wall motion abnormalities. The left ventricular internal cavity size was normal in size. There is  no left ventricular hypertrophy. Left ventricular diastolic parameters are consistent with Grade I diastolic dysfunction (impaired relaxation). Right Ventricle: The right ventricular size is normal. No increase in right ventricular wall thickness. Right ventricular systolic function is normal. Left Atrium: Left atrial size was normal in size. Right Atrium: Right atrial size was normal in size. Pericardium: There is no evidence of pericardial effusion. Mitral Valve: The mitral valve is normal in  structure. No evidence of mitral valve stenosis. Tricuspid Valve: The tricuspid valve is normal in structure. Tricuspid valve regurgitation is not demonstrated. No evidence of tricuspid stenosis. Aortic Valve: The aortic valve has been repaired/replaced. Aortic valve regurgitation is not visualized. No aortic stenosis is present. Aortic valve mean gradient measures 10.0 mmHg. Aortic valve peak gradient measures 19.4 mmHg. Aortic valve area, by VTI measures 0.84 cm. 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 Pulmonic Valve: The pulmonic valve was normal in structure. Pulmonic valve regurgitation is not visualized. No evidence of pulmonic stenosis. Aorta: The aortic root is normal in size and structure. Venous: The inferior vena cava is normal in size with greater than 50% respiratory variability, suggesting right atrial pressure of 3 mmHg. IAS/Shunts: No atrial level shunt detected by color flow Doppler. LEFT VENTRICLE PLAX 2D LVIDd:         3.80 cm   Diastology LVIDs:         2.90 cm   LV e' medial:    5.87 cm/s LV PW:         1.00 cm   LV E/e' medial:  7.4 LV IVS:        1.00 cm   LV e' lateral:   9.25 cm/s LVOT diam:     1.70 cm   LV E/e' lateral: 4.7 LV SV:         34 LV SV Index:   17 LVOT Area:     2.27 cm  IVC IVC diam: 1.20 cm AORTIC VALVE AV Area (Vmax):    0.88 cm AV Area (Vmean):   0.84 cm AV Area (VTI):     0.84 cm AV Vmax:           220.00 cm/s AV Vmean:          148.000 cm/s AV VTI:            0.398 m AV Peak Grad:      19.4 mmHg AV Mean Grad:      10.0 mmHg LVOT Vmax:         85.05 cm/s LVOT Vmean:        54.650 cm/s LVOT VTI:  0.148 m LVOT/AV VTI ratio: 0.37  AORTA Ao Root diam: 3.40 cm Ao Asc diam:  3.90 cm MITRAL VALVE MV Area (PHT): 1.98 cm    SHUNTS MV Decel Time: 384 msec    Systemic VTI:  0.15 m MV E velocity: 43.70 cm/s  Systemic Diam: 1.70 cm MV A velocity: 87.80 cm/s MV E/A ratio:  0.50 Candee Furbish MD Electronically signed by Candee Furbish MD Signature  Date/Time: 05/17/2021/4:40:36 PM    Final    [place] EKG SITE RITE  Result Date: 05/21/2021 If Site Rite image not attached, placement could not be confirmed due to current cardiac rhythm.    TODAY-DAY OF DISCHARGE:  Subjective:   Michael Hines today has no headache,no chest abdominal pain,no new weakness tingling or numbness  Objective:   Blood pressure (!) 172/78, pulse 71, temperature 97.8 F (36.6 C), temperature source Oral, resp. rate 16, height $RemoveBe'5\' 7"'PLQVkwSTe$  (1.702 m), weight 86.4 kg, SpO2 93 %.  Intake/Output Summary (Last 24 hours) at 05/28/2021 1213 Last data filed at 05/27/2021 2259 Gross per 24 hour  Intake 290 ml  Output 375 ml  Net -85 ml   Filed Weights   05/25/21 0307 05/25/21 0500 05/26/21 0500  Weight: 87.4 kg 84.3 kg 86.4 kg    Exam:  No new F.N deficits, Normal affect [place].AT,PERRAL Supple Neck,No JVD, No cervical lymphadenopathy appriciated.  Symmetrical Chest wall movement, Good air movement bilaterally, CTAB RRR,No Gallops,Rubs or new Murmurs, No Parasternal Heave +ve B.Sounds, Abd Soft, Non tender, No organomegaly appriciated, No rebound -guarding or rigidity. No Cyanosis, Clubbing or edema, No new Rash or bruise   PERTINENT RADIOLOGIC STUDIES: No results found.   PERTINENT LAB RESULTS: CBC: No results for input(s): WBC, HGB, HCT, PLT in the last 72 hours. CMET CMP     Component Value Date/Time   NA 139 05/26/2021 0320   K 2.5 (LL) 05/26/2021 0320   CL 106 05/26/2021 0320   CO2 23 05/26/2021 0320   GLUCOSE 204 (H) 05/26/2021 0320   BUN 6 (L) 05/26/2021 0320   CREATININE 1.40 (H) 05/26/2021 0320   CALCIUM 7.4 (L) 05/26/2021 0320   PROT 5.8 (L) 05/23/2021 0331   ALBUMIN 2.5 (L) 05/23/2021 0331   AST 17 05/23/2021 0331   ALT 10 05/23/2021 0331   ALKPHOS 102 05/23/2021 0331   BILITOT 2.2 (H) 05/23/2021 0331   GFRNONAA 53 (L) 05/26/2021 0320   GFRAA >60 12/04/2019 2042    GFR Estimated Creatinine Clearance: 49.3 mL/min (A) (by C-G formula  based on SCr of 1.4 mg/dL (H)). No results for input(s): LIPASE, AMYLASE in the last 72 hours. No results for input(s): CKTOTAL, CKMB, CKMBINDEX, TROPONINI in the last 72 hours. Invalid input(s): POCBNP No results for input(s): DDIMER in the last 72 hours. No results for input(s): HGBA1C in the last 72 hours. No results for input(s): CHOL, HDL, LDLCALC, TRIG, CHOLHDL, LDLDIRECT in the last 72 hours. No results for input(s): TSH, T4TOTAL, T3FREE, THYROIDAB in the last 72 hours.  Invalid input(s): FREET3 No results for input(s): VITAMINB12, FOLATE, FERRITIN, TIBC, IRON, RETICCTPCT in the last 72 hours. Coags: No results for input(s): INR in the last 72 hours.  Invalid input(s): PT Microbiology: Recent Results (from the past 240 hour(s))  Culture, blood (routine x 2)     Status: None   Collection Time: 05/20/21 12:02 PM   Specimen: BLOOD  Result Value Ref Range Status   Specimen Description BLOOD RIGHT ANTECUBITAL  Final   Special Requests   Final  BOTTLES DRAWN AEROBIC AND ANAEROBIC Blood Culture adequate volume   Culture   Final    NO GROWTH 5 DAYS Performed at [facility], [place] [address], [place], [place] [address]    Report Status 05/25/2021 FINAL  Final  Culture, blood (routine x 2)     Status: None   Collection Time: 05/20/21 12:02 PM   Specimen: BLOOD  Result Value Ref Range Status   Specimen Description BLOOD RIGHT ANTECUBITAL  Final   Special Requests   Final    BOTTLES DRAWN AEROBIC AND ANAEROBIC Blood Culture adequate volume   Culture   Final    NO GROWTH 5 DAYS Performed at [facility], [place] [address], [place], [place] [address]    Report Status 05/25/2021 FINAL  Final  Culture, blood (routine x 2)     Status: None   Collection Time: 05/22/21  2:46 PM   Specimen: BLOOD  Result Value Ref Range Status   Specimen Description BLOOD RIGHT ANTECUBITAL  Final   Special Requests   Final    BOTTLES DRAWN AEROBIC AND ANAEROBIC Blood Culture adequate  volume   Culture   Final    NO GROWTH 5 DAYS Performed at [facility], [place] [address] [place], [place] [address]    Report Status 05/27/2021 FINAL  Final  Culture, blood (routine x 2)     Status: None   Collection Time: 05/22/21  2:50 PM   Specimen: BLOOD RIGHT WRIST  Result Value Ref Range Status   Specimen Description BLOOD RIGHT WRIST  Final   Special Requests   Final    BOTTLES DRAWN AEROBIC AND ANAEROBIC Blood Culture adequate volume   Culture   Final    NO GROWTH 5 DAYS Performed at [facility], [place] [address], [place], [place] [address]    Report Status 05/27/2021 FINAL  Final    FURTHER DISCHARGE INSTRUCTIONS:  Get Medicines reviewed and adjusted: Please take all your medications with you for your next visit with your Primary MD  Laboratory/radiological data: Please request your Primary MD to go over all hospital tests and procedure/radiological results at the follow up, please ask your Primary MD to get all Hospital records sent to his/her office.  In some cases, they will be blood work, cultures and biopsy results pending at the time of your discharge. Please request that your primary care M.D. goes through all the records of your hospital data and follows up on these results.  Also Note the following: If you experience worsening of your admission symptoms, develop shortness of breath, life threatening emergency, suicidal or homicidal thoughts you must seek medical attention immediately by calling 911 or calling your MD immediately  if symptoms less severe.  You must read complete instructions/literature along with all the possible adverse reactions/side effects for all the Medicines you take and that have been prescribed to you. Take any new Medicines after you have completely understood and accpet all the possible adverse reactions/side effects.   Do not drive when taking Pain medications or sleeping medications (Benzodaizepines)  Do not take more than  prescribed Pain, Sleep and Anxiety Medications. It is not advisable to combine anxiety,sleep and pain medications without talking with your primary care practitioner  Special Instructions: If you have smoked or chewed Tobacco  in the last 2 yrs please stop smoking, stop any regular Alcohol  and or any Recreational drug use.  Wear Seat belts while driving.  Please note: You were  cared for by a hospitalist during your hospital stay. Once you are discharged, your primary care physician will handle any further medical issues. Please note that NO REFILLS for any discharge medications will be authorized once you are discharged, as it is imperative that you return to your primary care physician (or establish a relationship with a primary care physician if you do not have one) for your post hospital discharge needs so that they can reassess your need for medications and monitor your lab values.  Total Time spent coordinating discharge including counseling, education and face to face time equals 35 minutes.  SignedOren Binet 05/28/2021 12:13 PM

## 2021-05-28 NOTE — Plan of Care (Signed)
  Problem: Education: Goal: Knowledge of General Education information will improve Description: Including pain rating scale, medication(s)/side effects and non-pharmacologic comfort measures 05/28/2021 1828 by Sindy Messing, RN Outcome: Adequate for Discharge 05/28/2021 1828 by Sindy Messing, RN Outcome: Adequate for Discharge   Problem: Health Behavior/Discharge Planning: Goal: Ability to manage health-related needs will improve 05/28/2021 1828 by Sindy Messing, RN Outcome: Adequate for Discharge 05/28/2021 1828 by Sindy Messing, RN Outcome: Adequate for Discharge   Problem: Clinical Measurements: Goal: Ability to maintain clinical measurements within normal limits will improve 05/28/2021 1828 by Sindy Messing, RN Outcome: Adequate for Discharge 05/28/2021 1828 by Sindy Messing, RN Outcome: Adequate for Discharge Goal: Will remain free from infection 05/28/2021 1828 by Sindy Messing, RN Outcome: Adequate for Discharge 05/28/2021 1828 by Sindy Messing, RN Outcome: Adequate for Discharge Goal: Diagnostic test results will improve 05/28/2021 1828 by Sindy Messing, RN Outcome: Adequate for Discharge 05/28/2021 1828 by Sindy Messing, RN Outcome: Adequate for Discharge Goal: Respiratory complications will improve 05/28/2021 1828 by Sindy Messing, RN Outcome: Adequate for Discharge 05/28/2021 1828 by Sindy Messing, RN Outcome: Adequate for Discharge Goal: Cardiovascular complication will be avoided 05/28/2021 1828 by Sindy Messing, RN Outcome: Adequate for Discharge 05/28/2021 1828 by Sindy Messing, RN Outcome: Adequate for Discharge   Problem: Activity: Goal: Risk for activity intolerance will decrease 05/28/2021 1828 by Sindy Messing, RN Outcome: Adequate for Discharge 05/28/2021 1828 by Sindy Messing, RN Outcome: Adequate for Discharge   Problem: Nutrition: Goal: Adequate nutrition will be maintained 05/28/2021 1828 by Sindy Messing, RN Outcome: Adequate for Discharge 05/28/2021 1828 by Sindy Messing,  RN Outcome: Adequate for Discharge   Problem: Coping: Goal: Level of anxiety will decrease 05/28/2021 1828 by Sindy Messing, RN Outcome: Adequate for Discharge 05/28/2021 1828 by Sindy Messing, RN Outcome: Adequate for Discharge   Problem: Elimination: Goal: Will not experience complications related to bowel motility 05/28/2021 1828 by Sindy Messing, RN Outcome: Adequate for Discharge 05/28/2021 1828 by Sindy Messing, RN Outcome: Adequate for Discharge Goal: Will not experience complications related to urinary retention 05/28/2021 1828 by Sindy Messing, RN Outcome: Adequate for Discharge 05/28/2021 1828 by Sindy Messing, RN Outcome: Adequate for Discharge   Problem: Pain Managment: Goal: General experience of comfort will improve 05/28/2021 1828 by Sindy Messing, RN Outcome: Adequate for Discharge 05/28/2021 1828 by Sindy Messing, RN Outcome: Adequate for Discharge   Problem: Safety: Goal: Ability to remain free from injury will improve 05/28/2021 1828 by Sindy Messing, RN Outcome: Adequate for Discharge 05/28/2021 1828 by Sindy Messing, RN Outcome: Adequate for Discharge   Problem: Skin Integrity: Goal: Risk for impaired skin integrity will decrease 05/28/2021 1828 by Sindy Messing, RN Outcome: Adequate for Discharge 05/28/2021 1828 by Sindy Messing, RN Outcome: Adequate for Discharge   Problem: Fluid Volume: Goal: Hemodynamic stability will improve 05/28/2021 1828 by Sindy Messing, RN Outcome: Adequate for Discharge 05/28/2021 1828 by Sindy Messing, RN Outcome: Adequate for Discharge   Problem: Clinical Measurements: Goal: Diagnostic test results will improve 05/28/2021 1828 by Sindy Messing, RN Outcome: Adequate for Discharge 05/28/2021 1828 by Sindy Messing, RN Outcome: Adequate for Discharge Goal: Signs and symptoms of infection will decrease 05/28/2021 1828 by Sindy Messing, RN Outcome: Adequate for Discharge 05/28/2021 1828 by Sindy Messing, RN Outcome: Adequate for Discharge    Problem: Respiratory: Goal: Ability to maintain adequate ventilation will improve 05/28/2021 1828 by Sindy Messing, RN Outcome: Adequate for Discharge 05/28/2021 1828 by Sindy Messing, RN Outcome: Adequate for Discharge

## 2021-06-13 ENCOUNTER — Inpatient Hospital Stay: Payer: Medicare Other | Admitting: Infectious Diseases

## 2021-07-11 DEATH — deceased
